# Patient Record
Sex: Male | Born: 1960 | Race: White | Hispanic: No | Marital: Married
Health system: Midwestern US, Community
[De-identification: ages and names within clinical notes are randomized; demographics above are authoritative.]

## PROBLEM LIST (undated history)

## (undated) DIAGNOSIS — E162 Hypoglycemia, unspecified: Secondary | ICD-10-CM

## (undated) DIAGNOSIS — Z87898 Personal history of other specified conditions: Secondary | ICD-10-CM

## (undated) DIAGNOSIS — I2510 Atherosclerotic heart disease of native coronary artery without angina pectoris: Secondary | ICD-10-CM

## (undated) DIAGNOSIS — M1990 Unspecified osteoarthritis, unspecified site: Secondary | ICD-10-CM

## (undated) DIAGNOSIS — K219 Gastro-esophageal reflux disease without esophagitis: Secondary | ICD-10-CM

## (undated) DIAGNOSIS — Z862 Personal history of diseases of the blood and blood-forming organs and certain disorders involving the immune mechanism: Secondary | ICD-10-CM

## (undated) DIAGNOSIS — F32A Depression, unspecified: Secondary | ICD-10-CM

## (undated) DIAGNOSIS — Z87442 Personal history of urinary calculi: Secondary | ICD-10-CM

## (undated) DIAGNOSIS — N200 Calculus of kidney: Secondary | ICD-10-CM

## (undated) DIAGNOSIS — I480 Paroxysmal atrial fibrillation: Secondary | ICD-10-CM

## (undated) DIAGNOSIS — G4730 Sleep apnea, unspecified: Secondary | ICD-10-CM

## (undated) DIAGNOSIS — F419 Anxiety disorder, unspecified: Secondary | ICD-10-CM

## (undated) DIAGNOSIS — R82992 Hyperoxaluria: Secondary | ICD-10-CM

## (undated) DIAGNOSIS — R319 Hematuria, unspecified: Secondary | ICD-10-CM

## (undated) DIAGNOSIS — I861 Scrotal varices: Secondary | ICD-10-CM

## (undated) HISTORY — DX: Personal history of other specified conditions: Z87.898

## (undated) HISTORY — DX: Anxiety disorder, unspecified: F41.9

## (undated) HISTORY — PX: NECK SURGERY: SHX720

## (undated) HISTORY — PX: INCISIONAL HERNIA REPAIR: SHX193

## (undated) HISTORY — DX: Sleep apnea, unspecified: G47.30

## (undated) HISTORY — DX: Personal history of diseases of the blood and blood-forming organs and certain disorders involving the immune mechanism: Z86.2

## (undated) HISTORY — PX: KNEE ARTHROSCOPY: SUR90

## (undated) HISTORY — DX: Atherosclerotic heart disease of native coronary artery without angina pectoris: I25.10

## (undated) HISTORY — PX: CYSTOSCOPY: SUR368

## (undated) HISTORY — PX: OTHER SURGICAL HISTORY: SHX169

## (undated) HISTORY — PX: CHOLECYSTECTOMY: SHX55

## (undated) HISTORY — DX: Gastro-esophageal reflux disease without esophagitis: K21.9

## (undated) HISTORY — DX: Hypoglycemia, unspecified: E16.2

## (undated) HISTORY — PX: REPLACEMENT TOTAL KNEE: SUR1224

## (undated) HISTORY — DX: Depression, unspecified: F32.A

## (undated) HISTORY — PX: APPENDECTOMY: SHX54

## (undated) HISTORY — PX: CYST EXCISION: SHX5701

## (undated) HISTORY — PX: RHINOPLASTY: SUR1284

## (undated) HISTORY — DX: Calculus of kidney: N20.0

## (undated) HISTORY — DX: Paroxysmal atrial fibrillation: I48.0

---

## 2001-03-23 HISTORY — PX: GASTRIC BYPASS OPEN: SUR638

## 2008-06-28 LAB — LIPID PANEL
CHOL/HDL Ratio: 2.3 (ref 0–5.0)
Cholesterol, total: 130 MG/DL (ref 0–200)
HDL Cholesterol: 57 MG/DL (ref 40–60)
LDL, calculated: 56.2 MG/DL (ref 0–100)
LDL/HDL Ratio: 1
Triglyceride: 84 MG/DL (ref 0–150)
VLDL, calculated: 16.8 MG/DL

## 2008-06-28 LAB — METABOLIC PANEL, COMPREHENSIVE
A-G Ratio: 1.5 (ref 0.8–1.7)
ALT (SGPT): 39 U/L (ref 30–65)
AST (SGOT): 27 U/L (ref 15–37)
Albumin: 4.3 g/dL (ref 3.4–5.0)
Alk. phosphatase: 80 U/L (ref 50–136)
Anion gap: 15 mmol/L (ref 5–15)
BUN/Creatinine ratio: 26 — ABNORMAL HIGH (ref 12–20)
BUN: 18 MG/DL (ref 7–18)
Bilirubin, total: 0.5 MG/DL (ref 0.1–0.9)
CO2: 23 MMOL/L (ref 21–32)
Calcium: 8.5 MG/DL (ref 8.4–10.4)
Chloride: 104 MMOL/L (ref 100–108)
Creatinine: 0.7 MG/DL (ref 0.6–1.3)
GFR est AA: >60 mL/min/{1.73_m2} (ref >60)
GFR est non-AA: >60 mL/min/{1.73_m2} (ref >60)
Globulin: 2.9 g/dL (ref 2.0–4.0)
Glucose: 69 MG/DL — ABNORMAL LOW (ref 74–99)
Potassium: 4.5 MMOL/L (ref 3.5–5.5)
Protein, total: 7.2 g/dL (ref 6.4–8.2)
Sodium: 142 MMOL/L (ref 136–145)

## 2008-06-28 LAB — BILIRUBIN, DIRECT: Bilirubin, direct: 0.1 MG/DL (ref 0.0–0.3)

## 2008-06-28 LAB — PSA SCREENING (SCREENING): Prostate Specific Ag: 0.6 ng/mL (ref 0.0–4.0)

## 2008-06-28 LAB — CK: CK: 67 U/L (ref 35–232)

## 2012-03-11 DIAGNOSIS — R7989 Other specified abnormal findings of blood chemistry: Secondary | ICD-10-CM | POA: Insufficient documentation

## 2012-03-11 LAB — AMB POC URINALYSIS DIP STICK AUTO W/ MICRO (MICRO RESULTS)
Bacteria (UA POC): NEGATIVE
Bilirubin (UA POC): NEGATIVE
Blood (UA POC): NEGATIVE
Glucose (UA POC): NEGATIVE
Ketones (UA POC): NEGATIVE
Nitrites (UA POC): NEGATIVE
Protein (UA POC): NEGATIVE mg/dL
RBCs (UA POC): NEGATIVE
Specific gravity (UA POC): 1.035 (ref 1.001–1.035)
Urobilinogen (UA POC): 0.2 (ref 0.2–1)
pH (UA POC): 5.5 (ref 4.6–8.0)

## 2012-03-11 LAB — AMB POC PVR, MEAS,POST-VOID RES,US,NON-IMAGING: PVR: 22 cc

## 2012-03-11 NOTE — Progress Notes (Signed)
ASSESSMENT and PLAN  1. Low serum testosterone level      Testium samples an Rx  2 month f/u with Dr Allyne Gee, (free and total testosterone 1 week before appointment with Dr Allyne Gee)    Discussion: increase water intake\\                       Testosterone application discussed with patient      HISTORY OF PRESENT ILLNESS  Travis Chung is a 51 y.o. male.  HPI  [name] was referred to [facility] by Verline Lema NP for low testosterone.  01/29/12 Free testosterone: 5.8                 Testosterone: 400                  PSA: 0.47    Pt states that he has put on 40 pounds, after losing 60 pounds post gastric bypass.  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     [name] states that his libido has been decreased for the past 2 years. He states he can get an erection and maintain it for sex, but he just does not have the desire.     LUTS: weak/slow flow, incomplete bladder emptying, frequency   Pt denies:nocturia,  urgency, dribbling, incontinence, hesitancy, hematuria, incontinence, fever, nausea or vomiting.     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  FM Hx: no known   Social Hx: quit smoking in 05/07/2011, had smoked since he as a teenage.    Review of Systems   Constitutional: Negative.         Weight gain   HENT: Negative.    Eyes: Negative.    Respiratory: Negative.    Gastrointestinal: Positive for diarrhea.   Genitourinary: Positive for frequency. Negative for dysuria, urgency, hematuria and flank pain.   Musculoskeletal:        Knee pain    Skin: Negative.    Neurological: Negative.    Endo/Heme/Allergies: Negative.    Psychiatric/Behavioral: The patient has insomnia.        Physical Exam   Constitutional: He is oriented to person, place, and time. He appears well-developed and well-nourished.   HENT:   Head: Normocephalic.   Pulmonary/Chest: Effort normal.   Abdominal: Soft.    Genitourinary:   Pt declined exam until next visit with Dr Allyne Gee    Neurological: He is alert and oriented to person, place, and time.   Skin: Skin is warm and dry.   Psychiatric:   Irritable and "flies of the handle" easiliy       Results for orders placed in visit on 03/11/12   AMB POC URINALYSIS DIP STICK AUTO W/ MICRO (MICRO RESULTS)       Result Value Range    Color (UA POC) Yellow      Clarity (UA POC) Clear      Glucose (UA POC) Negative  Negative    Bilirubin (UA POC) Negative  Negative    Ketones (UA POC) Negative  Negative    Specific gravity (UA POC) 1.035  1.001 - 1.035    Blood (UA POC) Negative  Negative    pH (UA POC) 5.5  4.6 - 8.0    Protein (UA POC) Negative  Negative mg/dL    Urobilinogen (UA POC) 0.2 mg/dL  0.2 -  1    Nitrites (UA POC) Negative  Negative    Leukocyte esterase (UA POC) Trace  Negative    Epithelial cells (UA POC)        WBCs (UA POC) rare      RBCs (UA POC) n      Bacteria (UA POC) n      Crystals (UA POC)        Other (UA POC)       AMB POC PVR, MEAS,POST-VOID RES,US,NON-IMAGING       Result Value Range    PVR 22

## 2012-03-11 NOTE — Patient Instructions (Signed)
MyChart Activation    Thank you for requesting access to MyChart. Please follow the instructions below to securely access and download your online medical record. MyChart allows you to send messages to your doctor, view your test results, renew your prescriptions, schedule appointments, and more.    How Do I Sign Up?    1. In your internet browser, go to [email or web address].  2. Click on the First Time User? Click Here link in the Sign In box. You will see the New Member Sign Up page.  3. Enter your MyChart Access Code exactly as it appears below. You will not need to use this code after you???ve completed the sign-up process. If you do not sign up before the expiration date, you must request a new code.    MyChart Access Code: [email or web address]  Expires: 06/09/2012  8:42 AM (This is the date your MyChart access code will expire)    4. Enter the last four digits of your Social Security Number (xxxx) and Date of Birth (mm/dd/yyyy) as indicated and click Submit. You will be taken to the next sign-up page.  5. Create a MyChart ID. This will be your MyChart login ID and cannot be changed, so think of one that is secure and easy to remember.  6. Create a MyChart password. You can change your password at any time.  7. Enter your Password Reset Question and Answer. This can be used at a later time if you forget your password.   8. Enter your e-mail address. You will receive e-mail notification when new information is available in MyChart.  9. Click Sign Up. You can now view and download portions of your medical record.  10. Click the Download Summary menu link to download a portable copy of your medical information.    Additional Information    If you have questions, please visit the Frequently Asked Questions section of the MyChart website at [email or web address] Remember, MyChart is NOT to be used for urgent needs. For medical emergencies, dial 911.

## 2012-03-11 NOTE — Telephone Encounter (Signed)
Pt called to let us know that he was given (1) 30 day sample of Testim and (1) 30 day rx for Testim but his follow up appt with Dr. Allyne Gee is another 2 weeks past the time that he will run out of medication.  Per Dewayne Hatch, the patient is ok'd to pick up another Testim sample from the office when he runs out to hold him over until follow up appt/[id number]

## 2012-04-29 NOTE — Telephone Encounter (Signed)
Pt called to let us know that he was given (1) 30 day sample of Testim and (1) 30 day rx for Testim but his follow up appt with Dr. Allyne Gee is another 2 weeks past the time that he will run out of medication. Per Dewayne Hatch, the patient is ok'd to pick up another Testim sample from the office when he runs out to hold him over until follow up appt/[id number]          Pt showed up today to pu samples.  Only had one box to give.  Pt has enough meds to cover until the 21st.  No provider in office.  Please prepared a new rx to pu and call when ready.

## 2012-05-20 NOTE — Progress Notes (Signed)
After verifying pt's name and date of birth   F/T testosterone was drawn today  and sent to [facility] w/o incident   pt tolerated procedure well.     Pt was also given a rx for testim for 30 days with one refill.

## 2012-05-25 LAB — TESTOSTERONE, FREE & TOTAL
Free testosterone (Direct): 9.1 pg/mL (ref 6.8–21.5)
Testosterone: 620 ng/dL (ref 348–1197)

## 2012-05-27 LAB — AMB POC URINALYSIS DIP STICK AUTO W/ MICRO (MICRO RESULTS)
Bacteria (UA POC): NEGATIVE
Bilirubin (UA POC): NEGATIVE
Blood (UA POC): NEGATIVE
Glucose (UA POC): NEGATIVE
Ketones (UA POC): NEGATIVE
Nitrites (UA POC): NEGATIVE
Protein (UA POC): NEGATIVE mg/dL
RBCs (UA POC): NEGATIVE
Specific gravity (UA POC): 1.02 (ref 1.001–1.035)
Urobilinogen (UA POC): 0.2 (ref 0.2–1)
WBCs (UA POC): NEGATIVE
pH (UA POC): 6.5 (ref 4.6–8.0)

## 2012-05-27 NOTE — Progress Notes (Signed)
Pt scheduled for biopsy and given date, time, rxs and instructions

## 2012-05-27 NOTE — Progress Notes (Signed)
BP 110/68   Ht 5\' 11"  (1.803 m)   Wt 222 lb (100.699 kg)   BMI 30.98 kg/m2    AUA Symptom Score 05/27/2012   Over the past month how often have you had the sensation that your baldder was not completely empty after you finished urinating? 2   Over the past month, how often have had to urinate again less than 2 hours after you last finished urinating? 2   Over the past month, how often have you found you stopped and started again several times when you urinated? 0   Over the past month, how often have you found it dificult to postpone urination? 0   Over the past month, how often have you had a weak urinary stream? 2   Over the past month, how often have you had to push or strain to begin urinating? 0   Over the past month, how many times did you most tipically get up to urinate from the time you went to bed at night until the time you got up in the moring? 1   AUA Score 7   If you were to spend the rest of your life with your urinary condition the way it is now, how would you feel about that? Pleased

## 2012-05-27 NOTE — Progress Notes (Signed)
[facility]  Dr. Alesia Banda   [address]  [place], [place] [address]    Chief Complaint   Patient presents with   ??? Results     testosterone     November 03, 1960  Travis Chung    History of Present Illness:        Travis Chung is a 52 y.o. male      ASSESSMENT & PLAN----->06/08/12...  H/o renal stones.    On testim  Sex drive   But energy improved , sleeps better .  Last PSA 0.47      Does have right prostate nodule   rec TRUs / Bx before continuing .  Denies decreased FOS,  frequency,  urgency,  gross hematuria,  dysuria, and has nocturia Xonce    We discussed indication and options for further evaluation of right induration .  I recommend a prostate biopsy. Potential risks from this procedure are to include but not limited to infection, bleeding, dysuria and urinary retention.  Understanding his options and indications for the procedure, he agrees to planned procedure.     Problem List Date Reviewed: 06-08-12        Codes Class Noted    Low serum testosterone level 257.2  03/11/2012    Overview    Addendum Jun 08, 2012  9:38 AM by Alesia Banda, MD      Testosterone     620 / 9.1Free   2/14   Testosterone 400/ 5.8 Free 11/13  Before testim   PSA 0.47   Had blood work  Ok at [facility] office                       AUA Assessment Score:  AUA Score: 7;    AUA Bother Rating: Pleased        PHYSICAL EXAMINATION:    BP 110/68   Ht 5\' 11"  (1.803 m)   Wt 222 lb (100.699 kg)   BMI 30.98 kg/m2, Body mass index is 30.98 kg/(m^2).      General appearance: alert, cooperative, no distress, appears stated age   Head: Normocephalic, without obvious abnormality, atraumatic   Neck: supple, symmetrical, trachea midline and no adenopathy   Back: symmetric,  No CVA tenderness.   Heart: regular rate and rhythm,  Abdomen: soft, non-tender. No masses, no organomegaly   Male genitalia: penis: no lesions or discharge. testes: no masses or tenderness. no hernias   Prostate: induration right prostate lobe    Rectal: Normal tone, no  masses or tenderness   Extremities: extremities  normal, atraumatic, no cyanosis or edema   Neurologic: Grossly normal     PSA /TESTOSTERONE - BSHSI [85]    No results found for this basenameMarletta Chung,  [id number],  [id number],  [id number],  [id number]         Review of Systems:  Pertinent items are noted in HPI.    Past Surgical History   Procedure Laterality Date   ??? Hx orthopaedic     ??? Pr abdomen surgery proc unlisted     ??? Pr breast surgery procedure unlisted     ??? Hx heent       Past Medical History   Diagnosis Date   ??? Kidney stone          Social History:    History   Smoking status   ??? Former Smoker   ??? Quit date:  05/06/2010   Smokeless tobacco   ??? Never Used           CURRENT MEDS & ALLERGIES:  Current Outpatient Prescriptions   Medication Sig Dispense Refill   ??? testosterone (TESTIM) 50 mg/5 gram (1 %) gel 5 g by TransDERmal route daily for 30 days.  1 Tube  1   ??? multivitamin (ONE A DAY) tablet Take 1 Tab by mouth daily.         Allergies   Allergen Reactions   ??? Codeine Unknown (comments)                LABS & RESULTS:    Results for orders placed in visit on 05/27/12   AMB POC URINALYSIS DIP STICK AUTO W/ MICRO (MICRO RESULTS)       Result Value Range    Color (UA POC) Yellow      Clarity (UA POC) Clear      Glucose (UA POC) Negative  Negative    Bilirubin (UA POC) Negative  Negative    Ketones (UA POC) Negative  Negative    Specific gravity (UA POC) 1.020  1.001 - 1.035    Blood (UA POC) Negative  Negative    pH (UA POC) 6.5  4.6 - 8.0    Protein (UA POC) Negative  Negative mg/dL    Urobilinogen (UA POC) 0.2 mg/dL  0.2 - 1    Nitrites (UA POC) Negative  Negative    Leukocyte esterase (UA POC) Trace  Negative    Epithelial cells (UA POC)        WBCs (UA POC)        RBCs (UA POC)        Bacteria (UA POC)        Crystals (UA POC)    Negative    Other (UA POC)           This note has been sent to Almira Bar, MD, the referring physican, with findings and  recommendations

## 2012-06-06 LAB — AMB POC URINALYSIS DIP STICK AUTO W/ MICRO (MICRO RESULTS)
Bacteria (UA POC): NEGATIVE
Bilirubin (UA POC): NEGATIVE
Glucose (UA POC): NEGATIVE
Ketones (UA POC): NEGATIVE
Nitrites (UA POC): NEGATIVE
Protein (UA POC): NEGATIVE mg/dL
RBCs (UA POC): NEGATIVE
Specific gravity (UA POC): 1.025 (ref 1.001–1.035)
Urobilinogen (UA POC): 0.2 (ref 0.2–1)
pH (UA POC): 5.5 (ref 4.6–8.0)

## 2012-06-06 NOTE — Progress Notes (Signed)
Patient states after he got home from biopsy he had 5 BM's and they were very bloody.  I advised patient to stay off his feet, no heavy lifting, pulling or straining.  I told him I would advise Dr. Allyne Gee and if there was a cause for concern he would call him.

## 2012-06-06 NOTE — Progress Notes (Signed)
Problem List Date Reviewed: 05/27/2012        ICD-9-CM Class Noted    Low serum testosterone level 257.2  03/11/2012    Overview    Addendum 05/27/2012  9:38 AM by Alesia Banda, MD      Testosterone     620 / 9.1Free   2/14   Testosterone 400/ 5.8 Free 11/13  Before testim   PSA 0.47   Had blood work  Ok at [facility] office                 Transrectal Ultrasound of Prostate with Biospy of Prostate and/or Seminal Vesicles    Patient: Travis Chung       1960/11/27  [name]                                                      Date: 06/06/2012      Indications:  The patient was given an antibiotic and cleaning enema/suppository prior to the procedure described below.  He was placed in the left lateral decubitus position with knees drawn up.    Anesthesia: Prostate Block - 2% Xylocaine 10cc was injected bilaterally and 2% Xylocaine jelly    An endorectal probe was placed in the rectum.  Transverse and sagittal images of the prostate and seminal vesicles were obtained.  Using the needle guide on the probe, a biopsy needle was used to obtain biopsies of the prostate.  If seen, abnormally appearing areas were targeted along with systematic biopsies from each side.  Tissue cores were placed in formalin jars and sent for pathologic review.    The probe was removed and the patient was observed.  He was advised to finish his prescribed antibiotics and avoid strenuous physical and sexual activity for several days.  He was told to call our office if he developed fever, excessive or persistent blood in the urine/stool, or difficulty voiding.  We arranged follow-up so that we could review the results of this biopsy.    Biopsy-Number of cores:      #12        Base/Lateral Mid/Lateral Apex/Lateral      Right Side  1/1 1/1 1/1      Left Side 1/1 1/1 1/1                           Interpretation:  PSA: 0.47    Prostate Exam: induration right lobe   Prostatic Volume: 26 grams  PSA Density: 0.017  Prostatic capsule: Normal  Transition  Zone:calculi Right TZ   Lesions: None   Seminal Vesicles: Normal    Alesia Banda, MD

## 2012-06-06 NOTE — Progress Notes (Signed)
BP 110/72   Ht 6' (1.829 m)   Wt 214 lb (97.07 kg)   BMI 29.02 kg/m2    Patient here for Rocephin injection(1GM)per Dr. Allyne Gee order. }injection of Rocephin1GM administered IM to patient's gluteus ( right) without any difficulty(after spraying site with Ethyl Chloride first).   Orders Placed This Encounter   ??? AMB POC URINALYSIS DIP STICK AUTO W/ MICRO (MICRO RESULTS)   ??? CEFTRIAXONE SODIUM INJECTION PER 250 MG     Order Specific Question:  Dose     Answer:  1 gr     Order Specific Question:  Site     Answer:  RIGHT GLUTEUS     Order Specific Question:  Expiration Date     Answer:  10/22/2014     Order Specific Question:  Lot#     Answer:  [id number]     Order Specific Question:  Manufacturer     Answer:  [employer]     Order Specific Question:  Charge Quantity?     Answer:  4     Order Specific Question:  Perfomed by/Witnessed by:     Answer:  amr     Order Specific Question:  NDC#     Answer:  0454-0981-19   ??? PR THER/PROPH/DIAG INJECTION, SUBCUT/IM   ??? cefTRIAXone (ROCEPHIN) 1 gram injection     Sig: 1 g by IntraMUSCular route once for 1 dose.     Dispense:  1 Vial     Refill:  0     .

## 2012-06-08 NOTE — Addendum Note (Signed)
Addended by: Drexel Iha A on: 06/08/2012 10:36 AM     Modules accepted: Orders

## 2013-09-29 DIAGNOSIS — Z96659 Presence of unspecified artificial knee joint: Secondary | ICD-10-CM | POA: Insufficient documentation

## 2014-01-01 ENCOUNTER — Encounter: Attending: Urology | Primary: Nurse Practitioner

## 2014-01-01 ENCOUNTER — Ambulatory Visit
Admit: 2014-01-01 | Discharge: 2014-01-01 | Payer: PRIVATE HEALTH INSURANCE | Attending: Urology | Primary: Nurse Practitioner

## 2014-01-01 DIAGNOSIS — R319 Hematuria, unspecified: Secondary | ICD-10-CM

## 2014-01-01 LAB — AMB POC URINALYSIS DIP STICK AUTO W/ MICRO (MICRO RESULTS)
Bacteria (UA POC): NEGATIVE
Nitrites (UA POC): NEGATIVE
Specific gravity (UA POC): 1.025 (ref 1.001–1.035)
Urobilinogen (UA POC): 0.2 (ref 0.2–1)
WBCs (UA POC): NEGATIVE
pH (UA POC): 5 (ref 4.6–8.0)

## 2014-01-01 NOTE — Progress Notes (Signed)
Travis ConchRobert Ackman  July 05, 1960    Assessment:    ICD-10-CM ICD-9-CM    1. Hematuria R31.9 599.70 AMB POC URINALYSIS DIP STICK AUTO W/ MICRO (MICRO RESULTS)       Plan:  1. PSA  KUB   Cytology     Chief Complaint   Patient presents with   ??? Hematuria   ??? Kidney Stone     History of Present Illness: Travis ConchRobert Chung is a 53 y.o. male who presents today for long h/o stones  About 3 / year   H/o litho , ureteroscopy  And microscopic hematuria   No gross hematuria   .  Discussed hematuria w/u if gross hematuria while not passing stone.      No longer on testim   PSA  Was 0.47     FOS good  , Nocturia n , dysuria n , frequency n , urgency n, hematuria no gross      Problem List  Date Reviewed: 01/01/2014        Codes Class Noted    Low serum testosterone level ICD-10-CM:  E29.1  ICD-9-CM:  257.2  03/11/2012    Overview    Addendum 06/09/2012  7:58 AM by Alesia BandaKevin W Cynia Abruzzo, MD     Testosterone     620 / 9.1Free   2/14   Testosterone 400/ 5.8 Free 11/13  Before testim   PSA 0.47   Had blood work  Ok at [facility] office   TRUS / Bx   Neg 3/14                   We are asked to see for evaluation and management by Dr. Mayford Knifeurner   .       AUA Assessment Score:  AUA Score: 4;    AUA Bother Rating: Pleased               Past Medical History   Diagnosis Date   ??? Kidney stone        Past Surgical History   Procedure Laterality Date   ??? Hx orthopaedic     ??? Pr abdomen surgery proc unlisted     ??? Pr breast surgery procedure unlisted     ??? Hx heent         History     Social History   ??? Marital Status: MARRIED     Spouse Name: N/A     Number of Children: N/A   ??? Years of Education: N/A     Social History Main Topics   ??? Smoking status: Former Smoker     Quit date: 05/06/2010   ??? Smokeless tobacco: Never Used   ??? Alcohol Use: Yes   ??? Drug Use: No   ??? Sexual Activity:     Partners: Female     Other Topics Concern   ??? Not on file     Social History Narrative       No family history on file.    Outpatient Encounter Prescriptions as of 01/01/2014    Medication Sig Dispense Refill   ??? cyanocobalamin (VITAMIN B12) 1,000 mcg/mL injection 1,000 mcg by IntraMUSCular route once.     ??? venlafaxine-SR (EFFEXOR XR) 75 mg capsule Take  by mouth daily.     ??? cholecalciferol, VITAMIN D3, (VITAMIN D3) 5,000 unit tab tablet Take  by mouth daily.     ??? [DISCONTINUED] diazepam (VALIUM) 5 mg tablet 1-2  Tabs 1 hour before biopsy 2 Tab 0   ???  multivitamin (ONE A DAY) tablet Take 1 Tab by mouth daily.       No facility-administered encounter medications on file as of 01/01/2014.       Allergies   Allergen Reactions   ??? Codeine Unknown (comments)       Review of Systems  Constitutional: Fever:   Skin: Rash: No;  HEENT: Hearing difficulty: No;  Eyes: Blurred vision: No;  Cardiovascular: Chest pain: No;  Respiratory: Shortness of breath: No;  Gastrointestinal: Nausea/vomiting: No;  Musculoskeletal: Back pain: No;  Neurological: Weakness: No;  Psychological: Memory loss: No;  Comments/additional findings:   All other systems reviewed and are negative        Physical Exam:  BP 138/84 mmHg   Ht 5\' 11"  (1.803 m)   Wt 211 lb (95.709 kg)   BMI 29.44 kg/m2  Constitutional: WDWN, Pleasant and appropriate affect, No acute distress.    CV:  No peripheral swelling noted.  Respiratory: No respiratory distress or difficulties.  Abdomen:  No abdominal masses or tenderness. No CVA tenderness. No inguinal hernia noted.   GU Male:    GNF:[id number]:Perineum normal to visual inspection, no erythema or irritation, Sphincter with good tone, Rectum with no hemorrhoids, fissures or masses, Prostate  Slight right nodule  SCROTUM:  No scrotal rash or lesions noticed.  Normal bilateral testes and epididymis.   PENIS: Urethral meatus normal in location and size. No urethral discharge.  Skin: Normal color. No jaundice.    Neuro/Psych:  Alert and oriented x3, affect appropriate.   Lymphatic:   No enlarged inguinal lymph nodes.      UA:   Results for orders placed or performed in visit on 01/01/14    AMB POC URINALYSIS DIP STICK AUTO W/ MICRO (MICRO RESULTS)   Result Value Ref Range    Color (UA POC) Yellow     Clarity (UA POC) Clear     Glucose (UA POC) 1+ Negative    Bilirubin (UA POC) 1+ Negative    Ketones (UA POC) 1+ Negative    Specific gravity (UA POC) 1.025 1.001 - 1.035    Blood (UA POC) 2+ Negative    pH (UA POC) 5.0 4.6 - 8.0    Protein (UA POC) Trace Negative mg/dL    Urobilinogen (UA POC) 0.2 mg/dL 0.2 - 1    Nitrites (UA POC) Negative Negative    Leukocyte esterase (UA POC) Trace Negative    Epithelial cells (UA POC)      WBCs (UA POC) n     RBCs (UA POC) 4-5     Bacteria (UA POC) n Negative    Crystals (UA POC)  Negative    Other (UA POC)         Alesia BandaKEVIN W Laruth Hanger, MD on 01/01/2014

## 2014-01-01 NOTE — Progress Notes (Signed)
AUA Assessment Score:  AUA Score: 4;    AUA Bother Rating: Pleased  BP 138/84 mmHg   Ht 5\' 11"  (1.803 m)   Wt 211 lb (95.709 kg)   BMI 29.44 kg/m2

## 2014-01-03 LAB — CYTOLOGY NON GYN, UROLOGY OF ~~LOC~~ LAB

## 2014-01-03 LAB — URINE C&S

## 2014-01-04 NOTE — Progress Notes (Signed)
Quick Note:        Call Pt cytology was abnormal rec come in to discuss    ______

## 2014-01-08 NOTE — Progress Notes (Signed)
Quick Note:        Spoke with patient and made follow up appointment for 01/11/14    ______

## 2014-01-08 NOTE — Progress Notes (Signed)
Quick Note:        Left message for patient to call back for test results    ______

## 2014-01-11 ENCOUNTER — Ambulatory Visit
Admit: 2014-01-11 | Discharge: 2014-01-11 | Payer: PRIVATE HEALTH INSURANCE | Attending: Urology | Primary: Nurse Practitioner

## 2014-01-11 DIAGNOSIS — R3129 Other microscopic hematuria: Secondary | ICD-10-CM

## 2014-01-11 LAB — AMB POC URINALYSIS DIP STICK AUTO W/ MICRO (MICRO RESULTS)
Bacteria (UA POC): NEGATIVE
Bilirubin (UA POC): NEGATIVE
Glucose (UA POC): NEGATIVE
Ketones (UA POC): NEGATIVE
Nitrites (UA POC): NEGATIVE
Specific gravity (UA POC): 1.025 (ref 1.001–1.035)
Urobilinogen (UA POC): 0.2 (ref 0.2–1)
WBCs (UA POC): NEGATIVE
pH (UA POC): 5.5 (ref 4.6–8.0)

## 2014-01-11 LAB — CREATININE
Creatinine: 1 mg/dL (ref 0.5–1.2)
GFRAA: >60 (ref >60.0)
GFRNA: >60 (ref >60.0)

## 2014-01-11 NOTE — Progress Notes (Signed)
BP 118/78 mmHg   Ht 5\' 11"  (1.803 m)   Wt 214 lb (97.07 kg)   BMI 29.86 kg/m2  AUA Assessment Score:  AUA Score: 4;    AUA Bother Rating: Pleased

## 2014-01-11 NOTE — Progress Notes (Signed)
Travis ConchRobert Mondesir  1960-04-26    Assessment:    ICD-10-CM ICD-9-CM    1. Microscopic hematuria R31.2 599.72 CT UROGRAM W WO CONT      CREATININE      AMB POC URINALYSIS DIP STICK AUTO W/ MICRO (MICRO RESULTS)      CYTOLOGY NON GYN, [facility]   2. Hematuria, microscopic R31.2 599.72        Plan:  1. Ct urogram    Creatinine   Cysto      Chief Complaint   Patient presents with   ??? Hematuria     History of Present Illness: Travis Chung is a 53 y.o. male who presents today for atypical cytology consider  Neoplastic  C&S  Was NG       FOS good  , Nocturia n , dysuria n , frequency n , urgency n, hematuria no gross       Problem List  Date Reviewed: 01/11/2014        Codes Class Noted    Hematuria, microscopic ICD-10-CM:  R31.2  ICD-9-CM:  599.72  01/04/2014    Overview    Signed 01/04/2014  9:27 AM by Alesia BandaKevin W Jahad Old, MD     Cytology  10/15   atypical  May be reactive consider neoplastic           Renal calculi ICD-10-CM:  N20.0  ICD-9-CM:  592.0  01/04/2014        Low serum testosterone level ICD-10-CM:  E29.1  ICD-9-CM:  257.2  03/11/2012    Overview    Addendum 06/09/2012  7:58 AM by Alesia BandaKevin W Abbagale Goguen, MD     Testosterone     620 / 9.1Free   2/14   Testosterone 400/ 5.8 Free 11/13  Before testim   PSA 0.47   Had blood work  Ok at [facility]   TRUS / Bx   Neg 3/14                   We are asked to see for evaluation and management by Dr. Mayford Knifeurner .       AUA Assessment Score:  AUA Score: 4;    AUA Bother Rating: Pleased               Past Medical History   Diagnosis Date   ??? Kidney stone    ??? Hematuria, microscopic 01/04/2014     Cytology  10/15   atypical  May be reactive consider neoplastic        Past Surgical History   Procedure Laterality Date   ??? Hx orthopaedic     ??? Pr abdomen surgery proc unlisted     ??? Pr breast surgery procedure unlisted     ??? Hx heent         History     Social History   ??? Marital Status: MARRIED     Spouse Name: N/A     Number of Children: N/A   ??? Years of Education: N/A      Social History Main Topics   ??? Smoking status: Former Smoker     Quit date: 05/06/2010   ??? Smokeless tobacco: Never Used   ??? Alcohol Use: Yes   ??? Drug Use: No   ??? Sexual Activity:     Partners: Female     Other Topics Concern   ??? Not on file     Social History Narrative       History reviewed.  No pertinent family history.    Outpatient Encounter Prescriptions as of 01/11/2014   Medication Sig Dispense Refill   ??? cyanocobalamin (VITAMIN B12) 1,000 mcg/mL injection 1,000 mcg by IntraMUSCular route once.     ??? venlafaxine-SR (EFFEXOR XR) 75 mg capsule Take  by mouth daily.     ??? cholecalciferol, VITAMIN D3, (VITAMIN D3) 5,000 unit tab tablet Take  by mouth daily.     ??? multivitamin (ONE A DAY) tablet Take 1 Tab by mouth daily.       No facility-administered encounter medications on file as of 01/11/2014.       Allergies   Allergen Reactions   ??? Codeine Unknown (comments)       Review of Systems  Constitutional: Fever: No;  Skin: Rash: No;  HEENT: Hearing difficulty: No;  Eyes: Blurred vision: No;  Cardiovascular: Chest pain: No;  Respiratory: Shortness of breath: No;  Gastrointestinal: Nausea/vomiting: No;  Musculoskeletal: Back pain: No;  Neurological: Weakness: No;  Psychological: Memory loss: No;  Comments/additional findings:   All other systems reviewed and are negative        Physical Exam:  BP 118/78 mmHg   Ht 5\' 11"  (1.803 m)   Wt 214 lb (97.07 kg)   BMI 29.86 kg/m2  Constitutional: WDWN, Pleasant and appropriate affect, No acute distress.    CV:  No peripheral swelling noted.  Respiratory: No respiratory distress or difficulties.  Abdomen:  No abdominal masses or tenderness. No CVA tenderness. No inguinal hernia noted.   GU Male:   Skin: Normal color. No jaundice.    Neuro/Psych:  Alert and oriented x3, affect appropriate.   Lymphatic:   No enlarged inguinal lymph nodes.      UA:   Results for orders placed or performed in visit on 01/11/14   AMB POC URINALYSIS DIP STICK AUTO W/ MICRO (MICRO RESULTS)    Result Value Ref Range    Color (UA POC) Yellow     Clarity (UA POC) Clear     Glucose (UA POC) Negative Negative    Bilirubin (UA POC) Negative Negative    Ketones (UA POC) Negative Negative    Specific gravity (UA POC) 1.025 1.001 - 1.035    Blood (UA POC) 2+ Negative    pH (UA POC) 5.5 4.6 - 8.0    Protein (UA POC) 1+ Negative mg/dL    Urobilinogen (UA POC) 0.2 mg/dL 0.2 - 1    Nitrites (UA POC) Negative Negative    Leukocyte esterase (UA POC) Trace Negative    Epithelial cells (UA POC)      WBCs (UA POC)      RBCs (UA POC)      Bacteria (UA POC)  Negative    Crystals (UA POC)  Negative    Other (UA POC)         Alesia BandaKEVIN W Yuliya Nova, MD on 01/11/2014

## 2014-01-11 NOTE — Addendum Note (Signed)
Addended by: Rudi HeapMANN, Tymon Nemetz A on: 01/11/2014 04:25 PM      Modules accepted: Orders

## 2014-01-15 LAB — URINE C&S

## 2014-01-16 LAB — CYTOLOGY NON GYN, UROLOGY OF ~~LOC~~ LAB

## 2014-01-18 ENCOUNTER — Encounter

## 2014-01-18 NOTE — Telephone Encounter (Signed)
Patient called inquiring about his CT scan results says he is very concerned because his grandson has cancer.

## 2014-01-19 NOTE — Telephone Encounter (Signed)
Surgery scheduled for 02-09-14.  [id number]

## 2014-01-24 ENCOUNTER — Encounter: Attending: Urology | Primary: Nurse Practitioner

## 2014-01-24 LAB — UROVYSION FISH

## 2014-02-14 NOTE — Telephone Encounter (Signed)
Called and left message with patient on his home phone in regards to setting up an appointment. I called his cell phone and was able to reach him and we scheduled him for 03/10/14 at 8:00am with Dr. Winnifred Friarobey. Patient confirmed that it was to discuss surgery with Dr. Winnifred Friarobey.

## 2014-03-10 ENCOUNTER — Ambulatory Visit
Admit: 2014-03-10 | Discharge: 2014-03-10 | Payer: PRIVATE HEALTH INSURANCE | Attending: Urology | Primary: Nurse Practitioner

## 2014-03-10 DIAGNOSIS — N200 Calculus of kidney: Secondary | ICD-10-CM

## 2014-03-10 LAB — AMB POC URINALYSIS DIP STICK AUTO W/O MICRO
Bilirubin (UA POC): NEGATIVE
Glucose (UA POC): NEGATIVE
Ketones (UA POC): NEGATIVE
Nitrites (UA POC): NEGATIVE
Specific gravity (UA POC): 1.025 (ref 1.001–1.035)
Urobilinogen (UA POC): 0.2 (ref 0.2–1)
pH (UA POC): 5 (ref 4.6–8.0)

## 2014-03-10 NOTE — Progress Notes (Signed)
Travis ConchRobert Gamero  1960/12/11    Assessment/Plan:  1. Right staghorn calculus            History of gastric bypass 2005 - will need metabolic workup post surgery            right percutaneous nephrostolithotomy preceeded by percutaneous nephrostomy in radiology.   Discussed indications, risks and complications. All questions answered.    2. No anticoagulants      Talma Aguillard Maryjane HurterL Evaline Waltman, MD      Chief Complaint   Patient presents with   ??? Kidney Stone       History of Present Illness: Travis Chung is a 53 y.o. male who presents today for above issues. We are asked to see for evaluation and management by Dr. Allyne Gee*Sanders    Location right kidney  Duration years   Associated signs/symptoms pain  Modifying factors na  Severity mod    Radiographs reveal:   CT reviewed     Presently the patient denies febrile.  Patient denies chills and has flank pain.            The patient denies any difficulty voiding   Urinary incontinence: no  Hematuria: yes  Abdominal Pain: no  Weight loss: no    Has history of renal stone disease: yes  Family history of renal stone disease: no  History of bowel disease (crohn's etc): yes  Has history of gout: no    Family history of prostate cancer: no    Past Medical History   Diagnosis Date   ??? Kidney stone    ??? Hematuria, microscopic 01/04/2014     Cytology  10/15   atypical  May be reactive consider neoplastic        Past Surgical History   Procedure Laterality Date   ??? Hx orthopaedic     ??? Pr abdomen surgery proc unlisted     ??? Pr breast surgery procedure unlisted     ??? Hx heent         History     Social History   ??? Marital Status: MARRIED     Spouse Name: N/A     Number of Children: N/A   ??? Years of Education: N/A     Social History Main Topics   ??? Smoking status: Former Smoker     Quit date: 05/06/2010   ??? Smokeless tobacco: Never Used   ??? Alcohol Use: Yes   ??? Drug Use: No   ??? Sexual Activity:     Partners: Female     Other Topics Concern   ??? Not on file     Social History Narrative        No family history on file.    Outpatient Encounter Prescriptions as of 03/10/2014   Medication Sig Dispense Refill   ??? cyanocobalamin (VITAMIN B12) 1,000 mcg/mL injection 1,000 mcg by IntraMUSCular route once.     ??? venlafaxine-SR (EFFEXOR XR) 75 mg capsule Take  by mouth daily.     ??? cholecalciferol, VITAMIN D3, (VITAMIN D3) 5,000 unit tab tablet Take  by mouth daily.     ??? multivitamin (ONE A DAY) tablet Take 1 Tab by mouth daily.       No facility-administered encounter medications on file as of 03/10/2014.       Allergies   Allergen Reactions   ??? Codeine Unknown (comments)       Review of Systems  Constitutional: Fever: No  Skin: Rash: No  HEENT: Hearing difficulty:  No  Eyes: Blurred vision: No  Cardiovascular: Chest pain: No  Respiratory: Shortness of breath: No  Gastrointestinal: Nausea/vomiting: No  Musculoskeletal: Back pain: No  Neurological: Weakness: No  Psychological: Memory loss: No  Comments/additional findings:       Physical Exam:  BP 110/80 mmHg   Ht 5\' 11"  (1.803 m)   Wt 205 lb (92.987 kg)   BMI 28.60 kg/m2  Alert oriented to person, place, and time  Well developed  HEENT - normocephalic, no masses  Neck - full ROM, symmetric, no thyromegaly  CV - RRR, normal pulses in wrist and groin  Chest - unlabored resp, no wheezes  Abdomen - no masses, no tenderness, no hepatosplenomegaly, no distention, no hernias  Extremities - FROM, no edema of lower extremities    UA:   Results for orders placed or performed in visit on 03/10/14   AMB POC URINALYSIS DIP STICK AUTO W/O MICRO   Result Value Ref Range    Color (UA POC) Yellow     Clarity (UA POC) Clear     Glucose (UA POC) Negative Negative    Bilirubin (UA POC) Negative Negative    Ketones (UA POC) Negative Negative    Specific gravity (UA POC) 1.025 1.001 - 1.035    Blood (UA POC) 4+ Negative    pH (UA POC) 5.0 4.6 - 8.0    Protein (UA POC) 1+ Negative mg/dL    Urobilinogen (UA POC) 0.2 mg/dL 0.2 - 1    Nitrites (UA POC) Negative Negative     Leukocyte esterase (UA POC) Trace Negative         Review allergies, etc yes      ZO:[id number]:SUELLEN TURNER, MD      ICD-10-CM ICD-9-CM    1. Staghorn calculus N20.0 592.0 AMB POC URINALYSIS DIP STICK AUTO W/O MICRO   2. Urine leukocytes N39.0 791.7 AMB POC URINALYSIS DIP STICK AUTO W/O MICRO   3. Hematuria R31.9 599.70 AMB POC URINALYSIS DIP STICK AUTO W/O MICRO       I performed complete discussion and exam as noted above. All reviewed in detail.     Randa NgoEDWIN L Aranza Geddes, MD on 03/10/2014

## 2014-03-27 NOTE — Telephone Encounter (Signed)
Called and spoke with patient in regards to his surgery for Friday 03/30/14 [facility]. I asked if I could move his surgery time from 11:15am to 1:00pm due to his IR schedule being changed do to a scheduling accident. He was supposed to be scheduled at one, but the time was not confirmed so now his IR time is 11:00am. [facility]. Patient confirmed that would be fine. He would just be hungry a little while longer. I confirmed and apologized for the inconvenience. He said it was ok because he did not want to change days. I thanked him.

## 2014-03-30 NOTE — Telephone Encounter (Signed)
Called and spoke with patient's wife in regards to how her husband was doing after surgery and setting up his follow up appointments. She stated he did great during surgery, but he had not come back to his normal room yet so she was waiting. I said I was glad he did well during surgery. I told her that Dr. Dema Severin wanted him to come in on Monday 04/02/14 so we could get his tube removed, but I know they lived in [place] so I got it scheduled over at Dr. Baird Cancer office at 10:30am with the nurse. She confirmed the appointment. I said he was not due to come back for about 3 months with either Dr. Baird Cancer or Dr. Dema Severin and she said the patient would probably want Dr. Dema Severin. I told her that I would schedule the appointment and if for any reason he wanted to go back to Dr. Baird Cancer then I would just change the appointment. She confirmed. I explained the 24 hour urine test to her and said I would send off a script for a kit to be delivered to them. She confirmed and thanked me for my help.

## 2014-03-30 NOTE — Telephone Encounter (Signed)
pcn out Monday. 24 hour urine and follow up by phone. See me or Dr Allyne GeeSanders with kub in 3 months or so - Dr Allyne GeeSanders preference.    Caryn BeeKevin,    Lots of stone but looked good postop.     Thanks much, ed

## 2014-04-02 ENCOUNTER — Institutional Professional Consult (permissible substitution): Admit: 2014-04-02 | Discharge: 2014-04-02 | Payer: PRIVATE HEALTH INSURANCE | Primary: Nurse Practitioner

## 2014-04-02 ENCOUNTER — Encounter: Primary: Nurse Practitioner

## 2014-04-02 DIAGNOSIS — N200 Calculus of kidney: Secondary | ICD-10-CM

## 2014-04-02 MED ORDER — OXYCODONE-ACETAMINOPHEN 5 MG-325 MG TAB
5-325 mg | ORAL_TABLET | ORAL | Status: DC | PRN
Start: 2014-04-02 — End: 2014-06-28

## 2014-04-02 NOTE — Progress Notes (Signed)
Patient here for voiding trial per Dr. Winnifred Friarobey order.   Dr. Allyne GeeSanders is present in the clinic as incident to. 04/02/2014      I'm  Travis BandaKEVIN W SANDERS, Travis Chung and I approve of this assessment and plan             There were no vitals taken for this visit.    Patient is advised that there will be a slight discomfort during removal of the PCN tube. Privacy was provided.       Procedure:    Under clean technique, the balloon was emptied by inserting the barrel of the syringe and withdrawing the small amount of fluid used during inflation.  After the sutures were removed and the PCN tube was gently grasped and removed without difficulty. The area was examined, minimal skin breakdown from previous dressing was noted and a thick dressing applied.      Patient knows to report any symptoms of fever, chills, or severe pain to our office or to go to the ER if after hours.   Patient verbalized understanding of the above.  Questions were answered.      No orders of the defined types were placed in this encounter.       Travis Chung on 04/02/2014

## 2014-04-02 NOTE — Progress Notes (Signed)
Patient given script for Percocet per Dr. Allyne GeeSanders and Berkley Harveyharlie Branson.

## 2014-04-03 NOTE — Telephone Encounter (Signed)
Patient called in and stated that he had surgery with Dr. Winnifred Friarobey on 03/30/14 and has his PCN tube removed yesterday in the office with Ai. He asked when he could go back to work. I told him that with the tube removed he was welcome to go back to work today if he would like. I explained that with the procedure that he had, Dr. Winnifred Friarobey usually keeps his patients on light duty for the first 2 weeks and then after that they can go back to work full duty. The patient stated that with his job he does not really have a light duty, he does heavy lifting and climbing up ladders and other things of that nature. He asked if he could just be off for the 2 weeks that he is supposed to be on light duty and then just go back to work full duty. I told him that I would need to clear that with Dr. Winnifred Friarobey and I would call him back when I got an answer. He said that was fine. He faxed me the form that would need to be filled out saying that it was ok.

## 2014-04-23 NOTE — Telephone Encounter (Signed)
Patient aware that per Dr. Winnifred Friarobey retesting is needed for 24 hr urine panel.  [facility]

## 2014-05-18 NOTE — Progress Notes (Signed)
Quick Note:        repeat    ______

## 2014-05-28 NOTE — Progress Notes (Signed)
Quick Note:        urocit k 20 meq bid and repeat 24 hour urine in 3 months at follow up with kub    ______

## 2014-05-29 MED ORDER — POTASSIUM CITRATE SR 10 MEQ (1,080 MG) TAB
10 mEq (1,080 mg) | ORAL_TABLET | Freq: Two times a day (BID) | ORAL | Status: DC
Start: 2014-05-29 — End: 2014-06-28

## 2014-05-29 NOTE — Telephone Encounter (Addendum)
-----   Message from Randa NgoEdwin L Robey, MD sent at 05/28/2014  5:08 PM EST -----  urocit k 20 meq bid and repeat 24 hour urine in 3 months at follow up with kub    Patient aware of the Urocit K medication being sent to the preferred pharmacy. He will follow up with Dr. Winnifred Friarobey in April and we will repeat 24 hr urine in June. He is aware and will contact the office if he has any other questions.

## 2014-06-28 ENCOUNTER — Encounter: Primary: Nurse Practitioner

## 2014-06-28 ENCOUNTER — Ambulatory Visit
Admit: 2014-06-28 | Discharge: 2014-06-28 | Payer: PRIVATE HEALTH INSURANCE | Attending: Urology | Primary: Nurse Practitioner

## 2014-06-28 DIAGNOSIS — Z87442 Personal history of urinary calculi: Secondary | ICD-10-CM | POA: Insufficient documentation

## 2014-06-28 LAB — AMB POC URINALYSIS DIP STICK AUTO W/O MICRO
Bilirubin (UA POC): NEGATIVE
Glucose (UA POC): NEGATIVE
Ketones (UA POC): NEGATIVE
Leukocyte esterase (UA POC): NEGATIVE
Nitrites (UA POC): NEGATIVE
Protein (UA POC): NEGATIVE mg/dL
Specific gravity (UA POC): 1.03 (ref 1.001–1.035)
Urobilinogen (UA POC): 0.2 (ref 0.2–1)
pH (UA POC): 6.5 (ref 4.6–8.0)

## 2014-06-28 MED ORDER — POTASSIUM CITRATE SR 10 MEQ (1,080 MG) TAB
10 mEq (1,080 mg) | ORAL_TABLET | Freq: Two times a day (BID) | ORAL | Status: DC
Start: 2014-06-28 — End: 2016-09-07

## 2014-06-28 NOTE — Progress Notes (Signed)
KUB performed today per Dr. Robey.

## 2014-06-28 NOTE — Progress Notes (Signed)
Travis Chung  May 18, 1960    Assessment/Plan:  1. History of urolithiasis    No renal or ureteral calculi by KUB 06/28/14    S/p right PCNL 03/2014   Stone analysis: ca oxalate, ca phosphate    24-hour urine 04/2013: all indices abnormal    On urocit-k BID            Risk factors; h/o gastric bypass 2005    Continue urocit-k BID   Discussed dietary issues regarding stone disease.  F/u in 6 months with repeat 24-hour urine prior, same day KUB     3. History of microhematuria, 2/2 #2   Atypical urine cytology, negative FISH 12/2013   Normal bilateral retrogrades aside from stone burden on right 01/2014 by Dr. Jacklynn Lewis, MD      Chief Complaint   Patient presents with   ??? Kidney Stone     PT had SD KUB with no sx at this time     History of Present Illness: Travis Chung is a 54 y.o. male who presents today in follow up for his history of urolithiasis. The patient is s/p right PCNL 03/2014 for staghorn renal calculus. He has had two 24-hour urines which showed diffuse abnormal metabolic parameters specifically; hyperoxaluria, hypercalciuria, hypocitraturia, and low urine volume. Initiated on urocit-k BID and is tolerating well. F/u KUB today shows no renal or ureteral calculi. He is asymptomatic and denies flank pain, abdominal pain, gross hematuria, dysuria, n/v/f/c. He has increased his fluid intake as of recent.     Location right kidney  Duration years   Associated signs/symptoms none   Modifying factors n/a  Severity n/a    Radiographs reveal:   KUB today:  Impression:  Mild DJD of lumbar spine  Normal bowel gas pattern  Multiple surgical staples LUQ  No obvious renal or ureteral calculi   Several small pelvic vascular calcifications     Pre-op CTU 01/16/14 - report reviewed:  IMPRESSION:  1. Large obstructive staghorn calculus in the mid to lower pole  of the right kidney with resultant right upper pole  hydronephrosis.   2. Filling defect in the mid right ureter is nonspecific and  could represent a cast or urothelial thickening.  3. Status post gastric bypass.  4. Splenomegaly.     Presently the patient denies febrile.  Patient denies chills and denies flank pain.            The patient denies any difficulty voiding   Urinary incontinence: no  Hematuria: no  Abdominal Pain: no  Weight loss: no    Has history of renal stone disease: yes  Family history of renal stone disease: no  History of bowel disease (crohn's etc): s/p gastric bypass   Has history of gout: no    Family history of prostate cancer: no    Past Medical History   Diagnosis Date   ??? Kidney stone    ??? Hematuria, microscopic 01/04/2014     Cytology  10/15   atypical  May be reactive consider neoplastic        Past Surgical History   Procedure Laterality Date   ??? Hx orthopaedic     ??? Pr abdomen surgery proc unlisted     ??? Pr breast surgery procedure unlisted     ??? Hx heent         History     Social History   ??? Marital  Status: MARRIED     Spouse Name: N/A   ??? Number of Children: N/A   ??? Years of Education: N/A     Social History Main Topics   ??? Smoking status: Former Smoker     Quit date: 05/06/2010   ??? Smokeless tobacco: Never Used   ??? Alcohol Use: Yes   ??? Drug Use: No   ??? Sexual Activity:     Partners: Female     Other Topics Concern   ??? None     Social History Narrative       History reviewed. No pertinent family history.    Outpatient Encounter Prescriptions as of 06/28/2014   Medication Sig Dispense Refill   ??? potassium citrate (UROCIT-K 10) 10 mEq (1,080 mg) TbER Take 2 Tabs by mouth two (2) times a day. 90 Tab 3   ??? cyanocobalamin (VITAMIN B12) 1,000 mcg/mL injection 1,000 mcg by IntraMUSCular route once.     ??? venlafaxine-SR (EFFEXOR XR) 75 mg capsule Take  by mouth daily.     ??? cholecalciferol, VITAMIN D3, (VITAMIN D3) 5,000 unit tab tablet Take  by mouth daily.     ??? multivitamin (ONE A DAY) tablet Take 1 Tab by mouth daily.      ??? [DISCONTINUED] potassium citrate (UROCIT-K 10) 10 mEq (1,080 mg) TbER Take 2 Tabs by mouth two (2) times a day. 90 Tab 3   ??? [DISCONTINUED] oxyCODONE-acetaminophen (PERCOCET) 5-325 mg per tablet Take 1 Tab by mouth every four (4) hours as needed for Pain. Max Daily Amount: 6 Tabs. 30 Tab 0     No facility-administered encounter medications on file as of 06/28/2014.       Allergies   Allergen Reactions   ??? Codeine Unknown (comments)       Review of Systems  Constitutional: Fever: No  Skin: Rash: No  HEENT: Hearing difficulty: No  Eyes: Blurred vision: No  Cardiovascular: Chest pain: No  Respiratory: Shortness of breath: No  Gastrointestinal: Nausea/vomiting: No  Musculoskeletal: Back pain: No  Neurological: Weakness: No  Psychological: Memory loss: No  Comments/additional findings:     Physical Exam:  BP 136/88 mmHg   Ht 5\' 11"  (1.803 m)   Wt 215 lb (97.523 kg)   BMI 30.00 kg/m2  Alert oriented to person, place, and time  Well developed    UA:   Results for orders placed or performed in visit on 06/28/14   AMB POC URINALYSIS DIP STICK AUTO W/O MICRO   Result Value Ref Range    Color (UA POC) Yellow     Clarity (UA POC) Clear     Glucose (UA POC) Negative Negative    Bilirubin (UA POC) Negative Negative    Ketones (UA POC) Negative Negative    Specific gravity (UA POC) 1.030 1.001 - 1.035    Blood (UA POC) Trace Negative    pH (UA POC) 6.5 4.6 - 8.0    Protein (UA POC) Negative Negative mg/dL    Urobilinogen (UA POC) 0.2 mg/dL 0.2 - 1    Nitrites (UA POC) Negative Negative    Leukocyte esterase (UA POC) Negative Negative       ICD-10-CM ICD-9-CM    1. History of kidney stones Z87.442 V13.01 AMB POC XRAY, ABDOMEN; SINGLE ANTEROP      AMB POC URINALYSIS DIP STICK AUTO W/O MICRO   2. 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    I performed complete discussion and exam as noted above. All reviewed in detail.     Randa Ngo, MD on 06/28/2014

## 2014-08-22 NOTE — Telephone Encounter (Addendum)
Encounter opened in error

## 2014-12-10 ENCOUNTER — Ambulatory Visit
Admit: 2014-12-10 | Discharge: 2014-12-10 | Payer: PRIVATE HEALTH INSURANCE | Attending: Urology | Primary: Nurse Practitioner

## 2014-12-10 DIAGNOSIS — N200 Calculus of kidney: Secondary | ICD-10-CM

## 2014-12-10 NOTE — Progress Notes (Signed)
[facility] Clinical Research Protocol Visit    Study Participant in Hyperoxaluria clinical study    Assessment:  1. 54 y.o. white male w/ h/o kidney stone and hyperoxaluria   2. S/p right PCNL by Dr. Winnifred Friar in January 2016    Plan:  Continue participation in clinical trial per study protocol    Subjective:  Travis Chung  Presents today for examination pursuant to participation in [facility] clinical research protocol for Hyperoxaluria.  At this time the patient does not have symptoms.      negative for frequency, dysuria, nocturia, hesitancy and hematuria    Objective:    WDWN in No Acute distress Alert and oriented x 3  chest clear, no wheezing, rales, normal symmetric air entry  Heart exam - S1, S2 normal, no murmur, no gallop, rate regular  soft, non-tender. Bowel sounds normal. No masses,  no organomegaly  symmetric, no curvature. ROM normal. No CVA tenderness.  extremities normal, atraumatic, no cyanosis or edema    Results for orders placed or performed in visit on 06/28/14   AMB POC URINALYSIS DIP STICK AUTO W/O MICRO   Result Value Ref Range    Color (UA POC) Yellow     Clarity (UA POC) Clear     Glucose (UA POC) Negative Negative    Bilirubin (UA POC) Negative Negative    Ketones (UA POC) Negative Negative    Specific gravity (UA POC) 1.030 1.001 - 1.035    Blood (UA POC) Trace Negative    pH (UA POC) 6.5 4.6 - 8.0    Protein (UA POC) Negative Negative mg/dL    Urobilinogen (UA POC) 0.2 mg/dL 0.2 - 1    Nitrites (UA POC) Negative Negative    Leukocyte esterase (UA POC) Negative Negative       @    CBC w/Diff    No results found for: WBC, RBC, HCT, MCV, MCH, MCHC, RDW, HCTEXT, HCTEXT No results found for: BANDS, LYMPHOCYTES, MONOS, EOS, BASOS, BLAST, RDW     Comprehensive Metabolic Profile    Lab Results   Component Value Date/Time    NA 142 06/27/2008 03:49 PM    CO2 23 06/27/2008 03:49 PM    BUN 18 06/27/2008 03:49 PM    No results found for: ALBUMIN     Romualdo Bolk MD   Assistant Professor, Dept of Urology  [facility]  [facility] [place] [place]  Pager #: [phone]        Medical Documentation is provided with the assistance of Taj-ud-Din Leanna Sato, Medical Scribe for Jason Fila, MD on 12/10/2014

## 2014-12-11 ENCOUNTER — Ambulatory Visit
Admit: 2014-12-11 | Discharge: 2014-12-11 | Payer: PRIVATE HEALTH INSURANCE | Attending: Urology | Primary: Nurse Practitioner

## 2014-12-11 DIAGNOSIS — I861 Scrotal varices: Secondary | ICD-10-CM

## 2014-12-11 NOTE — Progress Notes (Signed)
Travis Chung  [birth year]    Assessment:    ICD-10-CM ICD-9-CM    1. Left varicocele I86.1 456.4 US SCROTUM/TESTICLES       Plan:  1. Scrotal sono     Chief Complaint   Patient presents with   ??? Testicle Pain     Varicoele     History of Present Illness: Travis Chung is a 54 y.o. 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  ICD-9-CM: 791.9  07/28/14        Hematuria, microscopic ICD-10-CM: R31.2  ICD-9-CM: 599.72  01/04/2014    Overview Addendum 01/24/2014  5:26 PM by Alesia Banda, MD     Cytology  10/15   atypical  May be reactive consider neoplastic   Cytology 10/15  favor neoplastic  Fish -Neg              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 Mayford Knife .       AUA Assessment Score:  AUA Score: 4;    AUA Bother Rating: Pleased               Past Medical History   Diagnosis Date   ??? Hematuria, microscopic 01/04/2014     Cytology  10/15   atypical  May be reactive consider neoplastic    ??? Kidney stone        Past Surgical History   Procedure Laterality Date   ??? Hx orthopaedic     ??? Pr abdomen surgery proc unlisted     ??? Pr breast surgery procedure unlisted      ??? Hx heent         Social History     Social History   ??? Marital status: MARRIED     Spouse name: N/A   ??? Number of children: N/A   ??? Years of education: N/A     Social History Main Topics   ??? Smoking status: Former Smoker     Quit date: 05/06/2010   ??? Smokeless tobacco: Never Used   ??? Alcohol use Yes   ??? Drug use: No   ??? Sexual activity: Yes  Partners: Female     Other Topics Concern   ??? None     Social History Narrative       History reviewed. No pertinent family history.    Outpatient Encounter Prescriptions as of 12/11/2014   Medication Sig Dispense Refill   ??? escitalopram oxalate (LEXAPRO) 20 mg tablet Take 20 mg by mouth daily.     ??? divalproex ER (DEPAKOTE ER) 500 mg ER tablet Take 2,000 mg by mouth nightly.     ??? prazosin (MINIPRESS) 2 mg capsule Take 2 mg by mouth nightly.     ??? traZODone (DESYREL) 100 mg tablet Take 200 mg by mouth nightly.     ??? potassium citrate (UROCIT-K 10) 10 mEq (1,080 mg) TbER Take 2 Tabs by mouth two (2) times a day. 90 Tab 3   ??? cyanocobalamin (VITAMIN B12) 1,000 mcg/mL injection 1,000 mcg by IntraMUSCular route once.     ??? venlafaxine-SR (EFFEXOR XR) 75 mg capsule Take  by mouth daily.     ??? cholecalciferol, VITAMIN D3, (VITAMIN D3) 5,000 unit tab tablet Take  by mouth daily.     ??? multivitamin (ONE A DAY) tablet Take 1 Tab by mouth daily.       No facility-administered encounter medications on file as of 12/11/2014.        Allergies   Allergen Reactions   ??? Codeine Unknown (comments)       Review of Systems  Constitutional: Fever: No  Skin: Rash: No  HEENT: Hearing difficulty: No  Eyes: Blurred vision: No  Cardiovascular: Chest pain: No  Respiratory: Shortness of breath: No  Gastrointestinal: Nausea/vomiting: No  Musculoskeletal: Back pain: No  Neurological: Weakness: No  Psychological: Memory loss: No  Comments/additional findings:   All other systems reviewed and are negative        Physical Exam:  Visit Vitals   ??? BP 118/70   ??? Ht  (1.803 m)   ??? Wt 225 lb (102.1 kg)    ??? BMI 31.38 kg/m2     Constitutional: WDWN, Pleasant and appropriate affect, No acute distress.    CV:  No peripheral swelling noted.  Respiratory: No respiratory distress or difficulties.  Abdomen:  No abdominal masses or tenderness. No CVA tenderness. No inguinal hernia noted.   GU Male:    [id number] normal to visual inspection, no erythema or irritation, Sphincter with good tone, Rectum with no hemorrhoids, fissures or masses, Prostate g, smooth, symmetric and anodular.  SCROTUM:  No scrotal rash or lesions noticed.  Normal bilateral testes and epididymis. ? Small varicocele , not sig     PENIS: Urethral meatus normal in location and size. No urethral discharge.  Skin: Normal color. No jaundice.    Neuro/Psych:  Alert and oriented x3, affect appropriate.   Lymphatic:   No enlarged inguinal lymph nodes.      UA:   Results for orders placed or performed in visit on 06/28/14   AMB POC URINALYSIS DIP STICK AUTO W/O MICRO   Result Value Ref Range    Color (UA POC) Yellow     Clarity (UA POC) Clear     Glucose (UA POC) Negative Negative    Bilirubin (UA POC) Negative Negative    Ketones (UA POC) Negative Negative    Specific gravity (UA POC) 1.030 1.001 - 1.035    Blood (UA POC) Trace Negative    pH (UA POC) 6.5 4.6 - 8.0    Protein (UA POC) Negative Negative mg/dL  Urobilinogen (UA POC) 0.2 mg/dL 0.2 - 1    Nitrites (UA POC) Negative Negative    Leukocyte esterase (UA POC) Negative Negative       Alesia Banda, MD on 12/11/2014

## 2014-12-11 NOTE — Progress Notes (Addendum)
The informed consent (V 2 DATED 10/03/2014) for a clinical trial for subjects with Hyperoxaluria was presented. (The subject had previously been given/mailed a copy of the ICF to review.) The subject was given time to read/review the ICF.  All questions were asked and answers were reviewed by the Investigator and the Coordinator with the subject to their satisfaction.  The subject has expressed the desire to participate in the clinical trial.  The subject has signed and dated the informed consent with all other parties.  A copy of the signed and dated ICF has been given to the subject for home files.  Screening procedures were initiated after the informed consent was signed and dated by all parties.        The subject is here today for a screening visit in a clinical trial for kidney stones and hyperoxaluria. The study coordinator obtained the medical history and medication history from the subject and from medical records that were available. The study coordinator confirmed with the subject that the medical history and medication history were correct to the best of their knowledge. Vital signs were obtained..  Visit Vitals   ??? BP 122/54 (BP 1 Location: Right arm, BP Patient Position: Sitting)   ??? Pulse (!) 58   ??? Temp 97.6 ??F (36.4 ??C) (Oral)   ??? Resp 12   ??? Ht  (1.803 m)   ??? Wt 225 lb 6.4 oz (102.2 kg)   ??? BMI 31.44 kg/m2      Labs were drawn, processed and sent to the laboratory for resulting. EKG was performed. Physical exam was performed by Dr. Lalla Brothers. The subject was instructed on how to complete the 24 hour urine testing and the diet recall phone calls. The subject was provided with all study materials needed.    For Complete Documentation of this Clinical Trial Visit See The Source Document For The Study Subject As The First Place For Documentation Is The Source Document      Rob Bunting

## 2014-12-11 NOTE — Progress Notes (Signed)
AUA Assessment Score:  AUA Score: 4;    AUA Bother Rating: Pleased  Visit Vitals   ??? BP 118/70   ??? Ht  (1.803 m)   ??? Wt 225 lb (102.1 kg)   ??? BMI 31.38 kg/m2

## 2014-12-12 NOTE — Telephone Encounter (Signed)
Patient called stating that he was on day 2 of his 2 24h urine collections. The first was completed  this am and shipped it off as directed.  He states that he is already 3/4 of the way filled on the 2nd jug for today's collection and is concerned that he will fill the jug before the end of his collection time.     Phone call made to the sponsor, instructions were given for the patient to be given a 2nd jug and for him to complete the remaining portion of his 24h collection in the new jug and mail it off just like the first, but NOT to mix any urine from the jugs together.     Telephone call placed to the patient to advise of the above. He states that he will not drive from [place] to pick up another jug and will "make due" with what he has.     Lead coordinator will be notified.     Quillian Quince, CCRP

## 2014-12-18 ENCOUNTER — Encounter

## 2014-12-18 NOTE — Progress Notes (Signed)
Call pt   No ultrasonographic  Evidence of a varicocele

## 2014-12-19 NOTE — Progress Notes (Signed)
Spoke with patient and gave him testicular US results.

## 2015-01-04 ENCOUNTER — Ambulatory Visit: Admit: 2015-01-04 | Discharge: 2015-01-04 | Payer: PRIVATE HEALTH INSURANCE | Primary: Nurse Practitioner

## 2015-01-04 DIAGNOSIS — R82992 Hyperoxaluria: Secondary | ICD-10-CM

## 2015-01-04 NOTE — Progress Notes (Signed)
The subject is here for the baseline visit (Day -1) in a clinical trial for hyperoxaluria. The subject states no change in his health and no changes in his concomitant medications. Vital signs were obtained. Labs were drawn, processed and sent for resulting. The subject was given 24 hr urine collection supplies and instructions. The subject was given study IP and dose instructions. The subject will return for next study visit.     Visit Vitals   ??? BP 112/80 (BP 1 Location: Right arm, BP Patient Position: Sitting)   ??? Pulse (!) 54   ??? Temp 98.3 ??F (36.8 ??C) (Oral)   ??? Resp 12   ??? Ht 5\' 11"  (1.803 m)   ??? Wt 225 lb (102.1 kg)   ??? BMI 31.38 kg/m2           For Complete Documentation of this Clinical Trial Visit See The Source Document For The Study Subject As The First Place For Documentation Is The Source Document.      Rob BuntingMichele L Eusebia Grulke

## 2015-01-11 NOTE — Addendum Note (Signed)
Addended by: Bing QuarrySCHINDLER, Ellisa Devivo L on: 01/11/2015 10:40 AM      Modules accepted: Orders

## 2015-01-14 ENCOUNTER — Ambulatory Visit: Admit: 2015-01-14 | Discharge: 2015-01-14 | Payer: PRIVATE HEALTH INSURANCE | Primary: Nurse Practitioner

## 2015-01-14 DIAGNOSIS — R82992 Hyperoxaluria: Secondary | ICD-10-CM

## 2015-01-14 NOTE — Progress Notes (Signed)
The subject is here today for Visit Day 10 in a clinical trial for hyperoxaluria.The subject stated no changes in his health and no changes in his concomitant medications.  Vital signs were obtained. Labs were drawn, processed and sent for resulting. The subject stated he was compliant with IP.  The subject was given 24 hr urine supplies.       Visit Vitals   ??? BP 130/70 (BP 1 Location: Left arm, BP Patient Position: Sitting)   ??? Pulse 60   ??? Temp 97.7 ??F (36.5 ??C) (Oral)   ??? Resp 12   ??? Ht 5\' 11"  (1.803 m)   ??? Wt 226 lb 9.6 oz (102.8 kg)   ??? BMI 31.6 kg/m2       For Complete Documentation of this Clinical Trial Visit See The Source Document For The Study Subject As The First Place For Documentation Is The Source Document.

## 2015-01-17 ENCOUNTER — Encounter: Attending: Urology | Primary: Nurse Practitioner

## 2015-01-17 ENCOUNTER — Encounter: Primary: Nurse Practitioner

## 2015-01-21 ENCOUNTER — Ambulatory Visit: Admit: 2015-01-21 | Discharge: 2015-01-21 | Payer: PRIVATE HEALTH INSURANCE | Primary: Nurse Practitioner

## 2015-01-21 DIAGNOSIS — R82992 Hyperoxaluria: Secondary | ICD-10-CM

## 2015-01-21 NOTE — Progress Notes (Signed)
The subject is here for day 17 in a clinical trial for hyperoxaluria. The subject stated no changes in his health and no changes in his concomitant medications. Vitals signs were obtained. The subject returned IP and new IP was given to the subject. The subject was given 24 hr urine supplies and instructions.      Visit Vitals   ??? BP 110/70 (BP 1 Location: Left arm, BP Patient Position: Sitting)   ??? Pulse 60   ??? Temp 98.3 ??F (36.8 ??C) (Oral)   ??? Wt 226 lb (102.5 kg)   ??? BMI 31.52 kg/m2       For Complete Documentation of this Clinical Trial Visit See The Source Document For The Study Subject As The First Place For Documentation Is The Source Document.      Rob BuntingMichele L Kadisha Goodine

## 2015-01-28 ENCOUNTER — Encounter: Primary: Nurse Practitioner

## 2015-02-04 ENCOUNTER — Ambulatory Visit: Admit: 2015-02-04 | Discharge: 2015-02-04 | Payer: PRIVATE HEALTH INSURANCE | Primary: Nurse Practitioner

## 2015-02-04 DIAGNOSIS — R82992 Hyperoxaluria: Secondary | ICD-10-CM

## 2015-02-04 NOTE — Progress Notes (Signed)
The subject is here for Day 31 visit in a clinical trial for hyperoxaluria. The subject states no changes in his health and no changes in concomitant medications. Vital signs were obtained. Labs were drawn, processed and sent to the laboratory for resulting. The subject was given 24 hr urine supplies and instructions. IP accountability performed and medication diary reviewed.    Visit Vitals   ??? BP 110/70 (BP 1 Location: Left arm, BP Patient Position: Sitting)   ??? Pulse (!) 56   ??? Temp 98 ??F (36.7 ??C) (Oral)   ??? Wt 227 lb 3.2 oz (103.1 kg)   ??? BMI 31.69 kg/m2           For Complete Documentation of this Clinical Trial Visit See The Source Document For The Study Subject As The First Place For Documentation Is The Source Document.      Travis BuntingMichele L Chrishana Chung

## 2015-02-11 ENCOUNTER — Ambulatory Visit
Admit: 2015-02-11 | Discharge: 2015-02-11 | Payer: PRIVATE HEALTH INSURANCE | Attending: Urology | Primary: Nurse Practitioner

## 2015-02-11 DIAGNOSIS — R82992 Hyperoxaluria: Secondary | ICD-10-CM

## 2015-02-11 NOTE — Addendum Note (Signed)
Addended by: Bing QuarrySCHINDLER, Djeneba Barsch L on: 02/11/2015 03:25 PM      Modules accepted: Level of Service

## 2015-02-11 NOTE — Progress Notes (Signed)
[facility] Clinical Research Protocol Visit    Study Participant in Hyperoxaluria clinical study    Assessment:  1. 54 y.o. white male w/ h/o kidney stone and hyperoxaluria   2. S/p right PCNL by Dr. Winnifred Friarobey in January 2016    Plan:  Patient has completed clinical trial and will f/u with Dr. Winnifred Friarobey     Subjective:  Travis Chung  Presents today for examination pursuant to participation in [facility] clinical research protocol for Hyperoxaluria.  At this time the patient does not have symptoms.      negative for frequency, dysuria, nocturia, hesitancy and hematuria    Objective:  WDWN in No Acute distress Alert and oriented x 3  chest clear, no wheezing, rales, normal symmetric air entry  Heart exam - RRR  soft, non-tender. Bowel sounds normal. No masses,  no organomegaly  symmetric, no curvature. ROM normal. No CVA tenderness.  extremities normal, atraumatic, no cyanosis or edema    LABS:  None      CBC w/Diff    No results found for: WBC, RBC, HCT, MCV, MCH, MCHC, RDW, HCTEXT, HCTEXT No results found for: BANDS, LYMPHOCYTES, MONOS, EOS, BASOS, BLAST, RDW     Comprehensive Metabolic Profile    Lab Results   Component Value Date/Time    NA 142 06/27/2008 03:49 PM    CO2 23 06/27/2008 03:49 PM    BUN 18 06/27/2008 03:49 PM    No results found for: ALBUMIN     Romualdo BolkJack Lambert MD  Assistant Professor, Dept of Urology  [facility]  [facility] [place]  Pager #: [phone]        Medical Documentation is provided with the assistance of Taj-ud-Din Leanna SatoJumanah, Medical Scribe for Jason FilaJack W Lambert, MD on 02/11/2015

## 2015-02-11 NOTE — Progress Notes (Addendum)
The subject is here today for Visit Day 38 (EOS). The subject stated there were no changes in his health and no changes in his concomitant medications. Vital signs were obtained. Labs were drawn processed and sent to the appropriate lab for resulting. An ECG was performed per protocol. A physical exam was performed. The subject stated that he returned all study supplies and remaining IP. The subject will follow up with Dr. Winnifred Friarobey.      For Complete Documentation of this Clinical Trial Visit See The Source Document For The Study Subject As The First Place For Documentation Is The Source Document.    Visit Vitals   ??? BP 120/70 (BP 1 Location: Left arm, BP Patient Position: Sitting)   ??? Pulse (!) 51   ??? Temp 97.9 ??F (36.6 ??C) (Oral)   ??? Wt 228 lb 6.4 oz (103.6 kg)   ??? BMI 31.86 kg/m2

## 2015-03-21 ENCOUNTER — Encounter: Attending: Urology | Primary: Nurse Practitioner

## 2015-04-15 ENCOUNTER — Encounter

## 2016-03-23 HISTORY — PX: CERVICAL SPINE SURGERY: SHX589

## 2016-09-01 NOTE — Telephone Encounter (Signed)
I scheduled an appt for an EP of Dr. Allyne GeeSanders for Monday, 09/07/2016 for dx: Right renal calculus Referred by Dr. Kizzie Ideavid P. Bayne.     Please put in order for KUB so that he can have it done at [facility] on same day.    Thank you.

## 2016-09-07 ENCOUNTER — Encounter

## 2016-09-07 ENCOUNTER — Ambulatory Visit: Admit: 2016-09-07 | Discharge: 2016-09-07 | Attending: Urology | Primary: Nurse Practitioner

## 2016-09-07 DIAGNOSIS — R3129 Other microscopic hematuria: Secondary | ICD-10-CM

## 2016-09-07 LAB — AMB POC URINALYSIS DIP STICK AUTO W/ MICRO (MICRO RESULTS)
Bilirubin (UA POC): NEGATIVE
Crystals (UA POC): NEGATIVE
Glucose (UA POC): NEGATIVE
Ketones (UA POC): NEGATIVE
Leukocyte esterase (UA POC): NEGATIVE
Nitrites (UA POC): NEGATIVE
Protein (UA POC): NEGATIVE
Specific gravity (UA POC): 1.025 (ref 1.001–1.035)
Urobilinogen (UA POC): 0.2 (ref 0.2–1)
WBCs (UA POC): 0
pH (UA POC): 5.5 (ref 4.6–8.0)

## 2016-09-07 NOTE — Progress Notes (Signed)
Travis Chung  May 03, 1960    Assessment:    ICD-10-CM ICD-9-CM    1. History of kidney stones Z87.442 V13.01 AMB POC URINALYSIS DIP STICK AUTO W/ MICRO (MICRO RESULTS)       Plan:  1. Right  7x4 mm  ESWL     The risks of ESWL includes but is not limited to hemorrhage which in extremely rare  cases  may  require transfusion, embolization, or even nephrectomy.  More common  side effects would be , pain , infection, the inability to pass retained fragments which may or may not require secondary procedure, Steinstrasse, and incomplete fragmentation of the stone which may require addition litho sessions.  The pt also understands that using litho may not remedy the pain.  The pt understands this and desires to proceed.  All questions were answered.       Chief Complaint   Patient presents with   ??? Kidney Stone     Pt here for kidney stone. pt stats he passed a today. pt c/o back pain x 1 day.      History of Present Illness: Travis Chung is a 56 y.o. male who presents today for RLP  7x4 mm  Stone  .  H/o  urteroscopy ,  HM#  Litho , PCNL       FOS good  , Nocturia n , dysuria n , frequency n , urgency n, hematuria n  Sees  DR   Judeth Horn  For DRE / PSA   (  0.5 )       Problem List  Date Reviewed: 07-23-14          Codes Class Noted    History of kidney stones ICD-10-CM: Z87.442  ICD-9-CM: V13.01  Jul 23, 2014    Overview Addendum 09/07/2016  8:34 AM by Alesia Banda, MD     CT  10/15 Kidneys: There is a large staghorn calculus in the right lower  pole renal collecting system measuring 2.7 x 2.4 x 2.4 cm. An  additional calyceal stone is noted measuring 1.0 x 0.7 x 0.8 cm.  There is mild right upper pole hydronephrosis.   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   ICD-9-CM: 791.9  Jul 23, 2014        Hematuria, microscopic ICD-10-CM: R31.29  ICD-9-CM: 599.72  01/04/2014    Overview Addendum 01/24/2014  5:26 PM by Alesia Banda, MD     Cytology  10/15   atypical  May be reactive consider neoplastic   Cytology 10/15  favor neoplastic  Fish -Neg              Low serum testosterone level ICD-10-CM: R79.89  ICD-9-CM: 790.99  03/11/2012    Overview Addendum 06/09/2012  7:58 AM by Alesia Banda, MD     Testosterone     620 / 9.1Free   2/14   Testosterone 400/ 5.8 Free 11/13  Before testim   PSA 0.47   Had blood work  Ok at [employer] office   TRUS / Bx   Neg 3/14  We are asked to see for evaluation and management by Dr. Mayford Knife  .       Discussed the patient's BMI with him.  The BMI follow up plan is as follows: BMI is out of normal parameters and plan is as follows: I have counseled this patient on diet and exercise regimens         AUA Assessment Score:  AUA Score: 8;    AUA Bother Rating: Mostly satisfied               Past Medical History:   Diagnosis Date   ??? Hematuria, microscopic 01/04/2014    Cytology  10/15   atypical  May be reactive consider neoplastic    ??? Kidney stone        Past Surgical History:   Procedure Laterality Date   ??? ABDOMEN SURGERY PROC UNLISTED     ??? BREAST SURGERY PROCEDURE UNLISTED     ??? HX HEENT     ??? HX ORTHOPAEDIC         Social History     Social History   ??? Marital status: MARRIED     Spouse name: N/A   ??? Number of children: N/A   ??? Years of education: N/A     Social History Main Topics   ??? Smoking status: Former Smoker     Quit date: 05/06/2010   ??? Smokeless tobacco: Never Used   ??? Alcohol use Yes   ??? Drug use: No   ??? Sexual activity: Yes     Partners: Female     Other Topics Concern   ??? None     Social History Narrative       History reviewed. No pertinent family history.    Outpatient Encounter Prescriptions as of 09/07/2016   Medication Sig Dispense Refill   ??? temazepam (RESTORIL) 30 mg capsule Take  by mouth nightly as needed for  Sleep.     ??? venlafaxine-SR (EFFEXOR XR) 75 mg capsule Take  by mouth daily.     ??? multivitamin (ONE A DAY) tablet Take 1 Tab by mouth daily.     ??? [DISCONTINUED] escitalopram oxalate (LEXAPRO) 20 mg tablet Take 20 mg by mouth daily.     ??? [DISCONTINUED] divalproex ER (DEPAKOTE ER) 500 mg ER tablet Take 2,000 mg by mouth nightly.     ??? [DISCONTINUED] prazosin (MINIPRESS) 2 mg capsule Take 2 mg by mouth nightly.     ??? [DISCONTINUED] traZODone (DESYREL) 100 mg tablet Take 200 mg by mouth nightly.     ??? [DISCONTINUED] potassium citrate (UROCIT-K 10) 10 mEq (1,080 mg) TbER Take 2 Tabs by mouth two (2) times a day. 90 Tab 3   ??? [DISCONTINUED] cyanocobalamin (VITAMIN B12) 1,000 mcg/mL injection 1,000 mcg by IntraMUSCular route once.     ??? [DISCONTINUED] cholecalciferol, VITAMIN D3, (VITAMIN D3) 5,000 unit tab tablet Take  by mouth daily.       No facility-administered encounter medications on file as of 09/07/2016.        Allergies   Allergen Reactions   ??? Codeine Unknown (comments)       Review of Systems  Constitutional: Fever: No  Skin: Rash: No  HEENT: Hearing difficulty: No  Eyes: Blurred vision: No  Cardiovascular: Chest pain: No  Respiratory: Shortness of breath: No  Gastrointestinal: Nausea/vomiting: No  Musculoskeletal: Back pain: No  Neurological: Weakness: No  Psychological: Memory loss: No  Comments/additional findings:   All other systems reviewed and are negative  Physical Exam:  Visit Vitals   ??? BP 118/80   ??? Ht 5\' 11"  (1.803 m)   ??? Wt 214 lb (97.1 kg)   ??? BMI 29.85 kg/m2     Constitutional: WDWN, Pleasant and appropriate affect, No acute distress.    CV:  No peripheral swelling noted.  Respiratory: No respiratory distress or difficulties.  Abdomen:  No abdominal masses or tenderness. No CVA tenderness. No inguinal hernia noted.   GU Male:    Skin: Normal color. No jaundice.    Neuro/Psych:  Alert and oriented x3, affect appropriate.   Lymphatic:   No enlarged inguinal lymph nodes.      UA:    Results for orders placed or performed in visit on 09/07/16   AMB POC URINALYSIS DIP STICK AUTO W/ MICRO (MICRO RESULTS)   Result Value Ref Range    Color (UA POC) Yellow     Clarity (UA POC) Clear     Glucose (UA POC) Negative Negative    Bilirubin (UA POC) Negative Negative    Ketones (UA POC) Negative Negative    Specific gravity (UA POC) 1.025 1.001 - 1.035    Blood (UA POC) 2+ Negative    pH (UA POC) 5.5 4.6 - 8.0    Protein (UA POC) Negative Negative    Urobilinogen (UA POC) 0.2 mg/dL 0.2 - 1    Nitrites (UA POC) Negative Negative    Leukocyte esterase (UA POC) Negative Negative    Epithelial cells (UA POC)      WBCs (UA POC)      RBCs (UA POC)      Bacteria (UA POC)  Negative    Crystals (UA POC)  Negative    Other (UA POC)         Alesia BandaKevin W Kaylinn Dedic, MD on 09/07/2016

## 2016-09-07 NOTE — Progress Notes (Signed)
Pt [facility] for his KUB. They did not have the order. I contacted Dr. Zella BallSander's office, Leeroy BockChelsea will be faxing the order to [phone].

## 2016-09-07 NOTE — Progress Notes (Signed)
KUB was not done at [facility], since [facility] the eval and treatment for patient.  [facility] was requiring a payment.  Pt will be keeping his appt with Dr. Allyne GeeSanders, though.

## 2016-09-09 LAB — CULTURE, URINE

## 2016-09-22 ENCOUNTER — Institutional Professional Consult (permissible substitution): Admit: 2016-09-22 | Discharge: 2016-09-22 | Primary: Nurse Practitioner

## 2016-09-22 DIAGNOSIS — Z87442 Personal history of urinary calculi: Secondary | ICD-10-CM

## 2016-09-22 LAB — AMB POC URINALYSIS DIP STICK AUTO W/ MICRO (MICRO RESULTS)
Bilirubin (UA POC): NEGATIVE
Blood (UA POC): NEGATIVE
Crystals (UA POC): NEGATIVE
Epithelial cells (UA POC): NEGATIVE
Glucose (UA POC): NEGATIVE
Ketones (UA POC): NEGATIVE
Leukocyte esterase (UA POC): NEGATIVE
Nitrites (UA POC): NEGATIVE
Protein (UA POC): NEGATIVE
RBCs (UA POC): 0
Specific gravity (UA POC): 1.02 (ref 1.001–1.035)
Urobilinogen (UA POC): 0.2 (ref 0.2–1)
WBCs (UA POC): 0
pH (UA POC): 5.5 (ref 4.6–8.0)

## 2016-09-22 NOTE — Progress Notes (Addendum)
Tana ConchRobert Chung is here today per Dr. Allyne Geesanders to give a urine specimen.       He is here for pre-procedure culture      Urine is obtained from patient via clean catch .     Patient is symptomatic: no  Patient complains of: nothing   Patient denies any urinary sxs    Urine was sent for culture.   This is not a repeat urine culture.   UA performed: yes    Results for orders placed or performed in visit on 09/22/16   CULTURE, URINE   Result Value Ref Range    RESULT Normal    AMB POC URINALYSIS DIP STICK AUTO W/ MICRO (MICRO RESULTS)   Result Value Ref Range    Color (UA POC) Yellow     Clarity (UA POC) Clear     Glucose (UA POC) Negative Negative    Bilirubin (UA POC) Negative Negative    Ketones (UA POC) Negative Negative    Specific gravity (UA POC) 1.020 1.001 - 1.035    Blood (UA POC) Negative Negative    pH (UA POC) 5.5 4.6 - 8.0    Protein (UA POC) Negative Negative    Urobilinogen (UA POC) 0.2 mg/dL 0.2 - 1    Nitrites (UA POC) Negative Negative    Leukocyte esterase (UA POC) Negative Negative    Epithelial cells (UA POC) n     WBCs (UA POC) 0      RBCs (UA POC) 0      Bacteria (UA POC) None Negative    Crystals (UA POC) Negative Negative    Other (UA POC)          [facility]rine [facility] to [facility] for processing of urine culture.   Patient is informed that it will be at least 48 hours before results are available     Orders Placed This Encounter   ??? CULTURE, URINE   ??? AMB POC URINALYSIS DIP STICK AUTO W/ MICRO (MICRO RESULTS)       Travis CloudAnn M Jakira Chung

## 2016-09-24 LAB — CULTURE, URINE
RESULT: NORMAL
Result: NORMAL

## 2016-11-13 ENCOUNTER — Ambulatory Visit
Admit: 2016-11-13 | Discharge: 2016-11-13 | Payer: PRIVATE HEALTH INSURANCE | Attending: Urology | Primary: Nurse Practitioner

## 2016-11-13 DIAGNOSIS — N489 Disorder of penis, unspecified: Secondary | ICD-10-CM

## 2016-11-13 LAB — AMB POC URINALYSIS DIP STICK AUTO W/ MICRO (MICRO RESULTS)
Bilirubin (UA POC): NEGATIVE
Blood (UA POC): NEGATIVE
Crystals (UA POC): NEGATIVE
Epithelial cells (UA POC): NEGATIVE
Glucose (UA POC): NEGATIVE
Ketones (UA POC): NEGATIVE
Leukocyte esterase (UA POC): NEGATIVE
Nitrites (UA POC): NEGATIVE
Protein (UA POC): NEGATIVE
RBCs (UA POC): 0
Specific gravity (UA POC): 1.02 (ref 1.001–1.035)
Urobilinogen (UA POC): 0.2 (ref 0.2–1)
WBCs (UA POC): 0
pH (UA POC): 6 (ref 4.6–8.0)

## 2016-11-13 NOTE — Progress Notes (Signed)
AUA Assessment Score:  AUA Score: 10;    AUA Bother Rating: Mostly satisfied  Visit Vitals   ??? BP 122/60   ??? Ht 5\' 11"  (1.803 m)   ??? Wt 226 lb (102.5 kg)   ??? BMI 31.52 kg/m2     SHIM 11/13/2016   How do you rate your confidence that you could get and keep an erection? 5   When you had erections with sexual stimulation, how often were your erections hard enough for penetration? 5   During sexual intercourse, how often were you able to maintain your erection after you had penetrated (entered) your partner? 5   During sexual intercourse, how easy was it to maintain your erection to completion of intercourse? 5   When you attempted sexual intercourse, how often was it satisfactory for you? 5   SHIM Score 25

## 2016-11-13 NOTE — Progress Notes (Signed)
Travis Chung  1960/06/02    Assessment:    ICD-10-CM ICD-9-CM    1. Penile abnormality N48.9 607.9 AMB POC URINALYSIS DIP STICK AUTO W/ MICRO (MICRO RESULTS)       Plan:  1. Kidney stones- KUB 09/29/16 showed a 0.3 x 0.9 cm calculus projected over the right midpole.????Adjacent punctate calculi. Pelvic phleboliths. ESWL on 09/29/16 was canceled secondary to his stone not being clearly visualized.  [facility]. This will check for stones and he will send me the results    2. 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 Denies any curvature to the penis or ED issues, will continue to monitor     I had a long discussion with the patient regarding Peyronie's disease and its possible causes.  He understands that the treatment options vary from very conservative to much more aggressive options.  The most conservative option includes observation with the patient understanding that over time, some men will see improvement in the penile curvature.  Many other men may simply have stabilization of the curvature without further progression.  However, some men will see continued worsening of the curvature which may significantly impact their ability to have adequate intercourse.    Medication options include vitamin E where some studies have shown varied results.  We also discussed the use of POTABA (potassium amino benzoic acid) but the dose required has a significant chance of gastrointestinal side effects and efficacy is questionable.    We also discussed the off label use of verapamil cream although longer-term studies have not shown this to be very effective.      We also discussed the use of collagenase or verapamil injections.    Lastly, we discussed surgical options including the "Nesbitt tuck" which in essence is creating another constriction 180 degrees from his existing  scar tissue to straighten the penis.  This however can cause some penile shortening.  Another surgical option would be excision of the scarred area and grafting material to allow better expansion and a reversal of the curvature.  Although penile shortening is not as much of an issue with this surgery as with the Oswald Hillock, there may be higher risk of nerve injury or cavernosal injury and scarring which could lead to erectile dysfunction.    If he desires surgical treatment for perone's disease, or a trial of verapamil injections or collagenase, I can refer him to one of the partners at [facility] in our Reconstructive Department.    The patient understood these issues and all questions were answered.      Return in 6 months for a stone f/u        Chief Complaint   Patient presents with   ??? Other     Patient presents today with a complaints of a "lump" on the penis for about 2 1/2 weeks     History of Present Illness: Travis Chung is a 56 y.o. male who presents today with complaints of a penis "lump" first noticed a few days ago. Denies any overly bothersome pain or trauma to penis. Reports minimal intermittent discomfort of the penis.  Denies any curvature to the penis. Denies flank pain, gross hematuria, dysuria, asymptomatic for infection. No f/c/n/v. Reports mild R sided pain. Per patient he is [facility]. [facility]  Problem List  Date Reviewed: 06/28/2014          Codes Class Noted    Penile abnormality ICD-10-CM: N48.9  ICD-9-CM: 607.9  11/13/2016        History of kidney stones ICD-10-CM: Z87.442  ICD-9-CM: V13.01  06/28/2014    Overview Addendum 09/07/2016  8:34 AM by Alesia Banda, MD     CT  10/15 Kidneys: There is a large staghorn calculus in the right lower  pole renal collecting system measuring 2.7 x 2.4 x 2.4 cm. An  additional calyceal stone is noted measuring 1.0 x 0.7 x 0.8 cm.   There is mild right upper pole hydronephrosis.   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  ICD-9-CM: 791.9  06/28/2014        Hematuria, microscopic ICD-10-CM: R31.29  ICD-9-CM: 599.72  01/04/2014    Overview Addendum 01/24/2014  5:26 PM by Alesia Banda, MD     Cytology  10/15   atypical  May be reactive consider neoplastic   Cytology 10/15  favor neoplastic  Fish -Neg              Low serum testosterone level ICD-10-CM: R79.89  ICD-9-CM: 790.99  03/11/2012    Overview Addendum 06/09/2012  7:58 AM by Alesia Banda, MD     Testosterone     620 / 9.1Free   2/14   Testosterone 400/ 5.8 Free 11/13  Before testim   PSA 0.47   Had blood work  Ok at [employer] office   TRUS / Bx   Neg 3/14                      We are asked to see for evaluation and management by Dr. Mayford Knife  .       Discussed the patient's BMI with him.  The BMI follow up plan is as follows: BMI is out of normal parameters and plan is as follows: I have counseled this patient on diet and exercise regimens         AUA Assessment Score:  AUA Score: 10;    AUA Bother Rating: Mostly satisfied               Past Medical History:   Diagnosis Date   ??? Hematuria, microscopic 01/04/2014    Cytology  10/15   atypical  May be reactive consider neoplastic    ??? Insomnia    ??? Kidney stone    ??? Penile lump        Past Surgical History:   Procedure Laterality Date   ??? ABDOMEN SURGERY PROC UNLISTED     ??? BREAST SURGERY PROCEDURE UNLISTED     ??? HX HEENT     ??? HX ORTHOPAEDIC         Social History     Social History   ??? Marital status: MARRIED     Spouse name: N/A   ??? Number of children: N/A   ??? Years of education: N/A     Social History Main  Topics   ??? Smoking status: Former Smoker     Quit date: 05/06/2010    ??? Smokeless tobacco: Never Used   ??? Alcohol use Yes   ??? Drug use: No   ??? Sexual activity: Yes     Partners: Female     Other Topics Concern   ??? None     Social History Narrative       History reviewed. No pertinent family history.    Outpatient Encounter Prescriptions as of 11/13/2016   Medication Sig Dispense Refill   ??? venlafaxine-SR (EFFEXOR XR) 75 mg capsule Take  by mouth daily.     ??? multivitamin (ONE A DAY) tablet Take 1 Tab by mouth daily.     ??? [DISCONTINUED] temazepam (RESTORIL) 30 mg capsule Take  by mouth nightly as needed for Sleep.       No facility-administered encounter medications on file as of 11/13/2016.        Allergies   Allergen Reactions   ??? Codeine Unknown (comments)       Review of Systems  Constitutional: Fever:   Skin: Rash:   HEENT: Hearing difficulty:   Eyes: Blurred vision:   Cardiovascular: Chest pain:   Respiratory: Shortness of breath:   Gastrointestinal: Nausea/vomiting:   Musculoskeletal: Back pain:   Neurological: Weakness:   Psychological: Memory loss:   Comments/additional findings:   All other systems reviewed and are negative        Physical Exam:  Visit Vitals   ??? BP 122/60   ??? Ht 5\' 11"  (1.803 m)   ??? Wt 226 lb (102.5 kg)   ??? BMI 31.52 kg/m2     Constitutional: WDWN, Pleasant and appropriate affect, No acute distress.    CV:  No peripheral swelling noted.  Respiratory: No respiratory distress or difficulties.  Abdomen:  No abdominal masses or tenderness. No CVA tenderness. No inguinal hernia noted.   GU Male:      SCROTUM:  No scrotal rash or lesions noticed.  Normal bilateral testes and epididymis.   PENIS: distal thickened plaque consistent with Peyronie's disease       Skin: Normal color. No jaundice.    Neuro/Psych:  Alert and oriented x3, affect appropriate.   Lymphatic:   No enlarged inguinal lymph nodes.      UA:   Results for orders placed or performed in visit on 11/13/16   AMB POC URINALYSIS DIP STICK AUTO W/ MICRO (MICRO RESULTS)   Result Value Ref Range     Color (UA POC) Yellow     Clarity (UA POC) Clear     Glucose (UA POC) Negative Negative    Bilirubin (UA POC) Negative Negative    Ketones (UA POC) Negative Negative    Specific gravity (UA POC) 1.020 1.001 - 1.035    Blood (UA POC) Negative Negative    pH (UA POC) 6.0 4.6 - 8.0    Protein (UA POC) Negative Negative    Urobilinogen (UA POC) 0.2 mg/dL 0.2 - 1    Nitrites (UA POC) Negative Negative    Leukocyte esterase (UA POC) Negative Negative    Epithelial cells (UA POC) n     WBCs (UA POC) 0      RBCs (UA POC) 0      Bacteria (UA POC) None Negative    Crystals (UA POC) Negative Negative    Other (UA POC)           ABDOMEN/KUB7/12/2016  [facility]  Result Impression  Impression:  1.Right renal calculi   Result Narrative   Indication: RENAL STONE.    Comparison: 02/09/14    Findings: KUB of the abdomen was performed.????    Support devices: Surgical clips right upper quadrant.    Calcifications: There 0.3 x 0.9 cm calculus projected over the right midpole.????Adjacent punctate calculi.    Pelvic phleboliths.    Solid organs: Normal.    Bowel: Normal.     US TESTICULAR SCAN9/26/2016  [facility]  Result Impression   Impression:    1.????Left epididymal vessels are not dilated more than 3 mm to suggest a varicocele.   Result Narrative   No comparisons are available.    Indication:????Left varicocele; I86.1     Right testicle measures 4.2 x 3.2 x 2.9 cm and the echotexture is normal.????Small right hydrocele is present.????Color Doppler evaluation shows color Doppler flow in the right testicle without hyperemia.????Spectral Doppler analysis shows arterial and venous Doppler waveforms.    Right epididymal head measures 1 cm.????There is a 4.9 mm cyst or spermatocele in the right epididymis.    The left testicle has a normal echotexture and measures 4.1 x 3.4 x 2.4 cm and the echotexture is normal.????Color Doppler evaluation shows color Doppler flow without hyperemia.????Spectral Doppler analysis demonstrates  arterial and venous Doppler waveforms.    Left epididymal head measures 7 mm. Left epididymal vessels measure up to 3 mm transverse even during Valsalva maneuvers.    Minimal left hydrocele is present.         Dr. Drema Halon on 11/13/2016                Documentation was provided with the assistance of Lewanda Rife, medical scribe for Dr. Drema Halon on 11/13/2016.

## 2020-02-07 ENCOUNTER — Other Ambulatory Visit (HOSPITAL_COMMUNITY): Payer: Self-pay | Admitting: Nurse Practitioner

## 2020-02-07 ENCOUNTER — Other Ambulatory Visit: Payer: Self-pay | Admitting: Family

## 2020-02-07 ENCOUNTER — Other Ambulatory Visit: Payer: Self-pay | Admitting: Nurse Practitioner

## 2020-02-07 DIAGNOSIS — R109 Unspecified abdominal pain: Secondary | ICD-10-CM

## 2020-02-29 ENCOUNTER — Other Ambulatory Visit: Payer: Self-pay

## 2020-02-29 ENCOUNTER — Ambulatory Visit (HOSPITAL_COMMUNITY)
Admission: RE | Admit: 2020-02-29 | Discharge: 2020-02-29 | Disposition: A | Discharge status: Home / Self Care | Payer: No Typology Code available for payment source | Source: Ambulatory Visit | Attending: Nurse Practitioner | Admitting: Nurse Practitioner

## 2020-02-29 DIAGNOSIS — R109 Unspecified abdominal pain: Secondary | ICD-10-CM | POA: Diagnosis present

## 2020-05-20 ENCOUNTER — Encounter (HOSPITAL_COMMUNITY): Payer: Self-pay

## 2020-05-20 ENCOUNTER — Other Ambulatory Visit: Payer: Self-pay

## 2020-05-20 ENCOUNTER — Other Ambulatory Visit (HOSPITAL_COMMUNITY)
Admission: RE | Admit: 2020-05-20 | Discharge: 2020-05-20 | Disposition: A | Discharge status: Home / Self Care | Payer: No Typology Code available for payment source | Source: Ambulatory Visit | Attending: Urology | Admitting: Urology

## 2020-05-20 ENCOUNTER — Ambulatory Visit (HOSPITAL_COMMUNITY)
Admission: RE | Admit: 2020-05-20 | Discharge: 2020-05-20 | Disposition: A | Discharge status: Home / Self Care | Payer: No Typology Code available for payment source | Source: Ambulatory Visit | Attending: Urology | Admitting: Urology

## 2020-05-20 ENCOUNTER — Encounter (HOSPITAL_COMMUNITY)
Admission: RE | Admit: 2020-05-20 | Discharge: 2020-05-20 | Disposition: A | Discharge status: Home / Self Care | Payer: No Typology Code available for payment source | Source: Ambulatory Visit | Attending: Urology | Admitting: Urology

## 2020-05-20 ENCOUNTER — Ambulatory Visit (INDEPENDENT_AMBULATORY_CARE_PROVIDER_SITE_OTHER): Payer: No Typology Code available for payment source | Admitting: Urology

## 2020-05-20 ENCOUNTER — Encounter: Payer: Self-pay | Admitting: Urology

## 2020-05-20 VITALS — BP 124/82 | HR 68 | Temp 98.3°F | Ht 71.0 in | Wt 250.0 lb

## 2020-05-20 DIAGNOSIS — N200 Calculus of kidney: Secondary | ICD-10-CM

## 2020-05-20 HISTORY — DX: Unspecified osteoarthritis, unspecified site: M19.90

## 2020-05-20 LAB — URINALYSIS, ROUTINE W REFLEX MICROSCOPIC
Bilirubin, UA: NEGATIVE
Glucose, UA: NEGATIVE
Ketones, UA: NEGATIVE
Leukocytes,UA: NEGATIVE
Nitrite, UA: NEGATIVE
Protein,UA: NEGATIVE
RBC, UA: NEGATIVE
Specific Gravity, UA: 1.02 (ref 1.005–1.030)
Urobilinogen, Ur: 0.2 mg/dL (ref 0.2–1.0)
pH, UA: 5 (ref 5.0–7.5)

## 2020-05-20 NOTE — Patient Instructions (Signed)
William Farley  05/20/2020     @PREFPERIOPPHARMACY @   Your procedure is scheduled on Tomorrow at 0900.  Report to [facility] at 0900 A.M.  Call this number if you have problems the morning of surgery:  [phone]   Remember:  Do not eat or drink after midnight.      Take these medicines the morning of surgery with A SIP OF WATER klonopin if needed, omeprazole and paxil    Do not wear jewelry, make-up or nail polish.  Do not wear lotions, powders, or perfumes, or deodorant.  Do not shave 48 hours prior to surgery.  Men may shave face and neck.  Do not bring valuables to the hospital.  [facility] is not responsible for any belongings or valuables.  Contacts, dentures or bridgework may not be worn into surgery.  Leave your suitcase in the car.  After surgery it may be brought to your room.  For patients admitted to the hospital, discharge time will be determined by your treatment team.  Patients discharged the day of surgery will not be allowed to drive home.   Name and phone number of your driver:   driver Special instructions:  Please bring your blue folder  Please read over the following fact sheets that you were given. Surgical Site Infection Prevention, Anesthesia Post-op Instructions and Care and Recovery After Surgery       Lithotripsy  Lithotripsy is a treatment that can help break up kidney stones that are too large to pass on their own. This is a nonsurgical procedure that crushes a kidney stone with shock waves. These shock waves pass through your body and focus on the kidney stone. They cause the kidney stone to break up into smaller pieces while it is still in the urinary tract. The smaller pieces of stone can pass more easily out of your body in the urine. Tell a health care provider about:  Any allergies you have.  All medicines you are taking, including vitamins, herbs, eye drops, creams, and over-the-counter medicines.  Any problems you or family  members have had with anesthetic medicines.  Any blood disorders you have.  Any surgeries you have had.  Any medical conditions you have.  Whether you are pregnant or may be pregnant. What are the risks? Generally, this is a safe procedure. However, problems may occur, including:  Infection.  Bleeding from the kidney.  Bruising of the kidney or skin.  Scarring of the kidney, which can lead to: ? Increased blood pressure. ? Poor kidney function. ? Return (recurrence) of kidney stones.  Damage to other structures or organs, such as the liver, colon, spleen, or pancreas.  Blockage (obstruction) of the tube that carries urine from the kidney to the bladder (ureter).  Failure of the kidney stone to break into pieces (fragments). What happens before the procedure? Staying hydrated Follow instructions from your health care provider about hydration, which may include:  Up to 2 hours before the procedure - you may continue to drink clear liquids, such as water, clear fruit juice, black coffee, and plain tea. Eating and drinking restrictions Follow instructions from your health care provider about eating and drinking, which may include:  8 hours before the procedure - stop eating heavy meals or foods, such as meat, fried foods, or fatty foods.  6 hours before the procedure - stop eating light meals or foods, such as toast or cereal.  6 hours before the procedure - stop drinking milk or drinks  that contain milk.  2 hours before the procedure - stop drinking clear liquids. Medicines Ask your health care provider about:  Changing or stopping your regular medicines. This is especially important if you are taking diabetes medicines or blood thinners.  Taking medicines such as aspirin and ibuprofen. These medicines can thin your blood. Do not take these medicines unless your health care provider tells you to take them.  Taking over-the-counter medicines, vitamins, herbs, and  supplements. Tests You may have tests, such as:  Blood tests.  Urine tests.  Imaging tests, such as a CT scan. General instructions  Plan to have someone take you home from the hospital or clinic.  If you will be going home right after the procedure, plan to have someone with you for 24 hours.  Ask your health care provider what steps will be taken to help prevent infection. These may include washing skin with a germ-killing soap. What happens during the procedure?  An IV will be inserted into one of your veins.  You will be given one or more of the following: ? A medicine to help you relax (sedative). ? A medicine to make you fall asleep (general anesthetic).  A water-filled cushion may be placed behind your kidney or on your abdomen. In some cases, you may be placed in a tub of lukewarm water.  Your body will be positioned in a way that makes it easy to target the kidney stone.  An X-ray or ultrasound exam will be done to locate your stone.  Shock waves will be aimed at the stone. If you are awake, you may feel a tapping sensation as the shock waves pass through your body.  A flexible tube with holes in it (stent) may be placed in the ureter. This will help keep urine flowing from the kidney if the fragments of the stone have been blocking the ureter. The procedure may vary among health care providers and hospitals.   What happens after the procedure?  You may have an X-ray to see whether the procedure was able to break up the kidney stone and how much of the stone has passed. If large stone fragments remain after treatment, you may need to have a second procedure at a later time.  Your blood pressure, heart rate, breathing rate, and blood oxygen level will be monitored until you leave the hospital or clinic.  You may be given antibiotics or pain medicine as needed.  If a stent was placed in your ureter during surgery, it may stay in place for a few weeks.  You may need  to strain your urine to collect pieces of the kidney stone for testing.  You will need to drink plenty of water.  If you were given a sedative during the procedure, it can affect you for several hours. Do not drive or operate machinery until your health care provider says that it is safe. Summary  Lithotripsy is a treatment that can help break up kidney stones that are too large to pass on their own.  Lithotripsy is a nonsurgical procedure that crushes a kidney stone with shock waves.  Generally, this is a safe procedure. However, problems may occur, including damage to the kidney or other organs, infection, or obstruction of the tube that carries urine from the kidney to the bladder (ureter).  You may have a stent placed in your ureter to help drain your urine. This stent may stay in place for a few weeks.  After the procedure,  you will need to drink plenty of water. You may be asked to strain your urine to collect pieces of the kidney stone for testing. This information is not intended to replace advice given to you by your health care provider. Make sure you discuss any questions you have with your health care provider. Document Revised: 12/21/2018 Document Reviewed: 12/21/2018 Elsevier Patient Education  2021 [place] After This sheet gives you information about how to care for yourself after your procedure. Your health care provider may also give you more specific instructions. If you have problems or questions, contact your health care provider. What can I expect after the procedure? After the procedure, it is common to have:  Some blood in your urine. This should only last for a few days.  Soreness in your back, sides, or upper abdomen for a few days.  Blotches or bruises on the area where the shock wave entered the skin.  Pain, discomfort, or nausea when pieces (fragments) of the kidney stone move through the tube that carries urine from the kidney to the  bladder (ureter). Stone fragments may pass soon after the procedure, but they may continue to pass for up to 4-8 weeks. ? If you have severe pain or nausea, contact your health care provider. This may be caused by a large stone that was not broken up, and this may mean that you need more treatment.  Some pain or discomfort during urination.  Some pain or discomfort in the lower abdomen or (in men) at the base of the penis. Follow these instructions at home: Medicines  Take over-the-counter and prescription medicines only as told by your health care provider.  If you were prescribed an antibiotic medicine, take it as told by your health care provider. Do not stop taking the antibiotic even if you start to feel better.  Ask your health care provider if the medicine prescribed to you requires you to avoid driving or using machinery. Eating and drinking  Drink enough fluid to keep your urine pale yellow. This helps any remaining pieces of the stone to pass. It can also help prevent new stones from forming.  Eat plenty of fresh fruits and vegetables.  Follow instructions from your health care provider about eating or drinking restrictions. You may be instructed to: ? Reduce how much salt (sodium) you eat or drink. Check ingredients and nutrition facts on packaged foods and beverages to see how much sodium they contain. ? Reduce how much meat you eat.  Eat the recommended amount of calcium for your age and gender. Ask your health care provider how much calcium you should have.      General instructions  Get plenty of rest.  Return to your normal activities as told by your health care provider. Ask your health care provider what activities are safe for you. Most people can resume normal activities 1-2 days after the procedure.  If you were given a sedative during the procedure, it can affect you for several hours. Do not drive or operate machinery until your health care provider says that  it is safe.  Your health care provider may direct you to lie in a certain position (postural drainage) and tap firmly (percuss) over your kidney area to help stone fragments pass. Follow instructions as told by your health care provider.  If directed, strain all urine through the strainer that was provided by your health care provider. ? Keep all fragments for your health care provider to see.  Any stones that are found may be sent to a medical lab for examination. The stone may be as small as a grain of salt.  Keep all follow-up visits as told by your health care provider. This is important. Contact a health care provider if:  You have a fever or chills.  You have nausea that is severe or does not go away.  You have any of these urinary symptoms: ? Blood in your urine for longer than your health care provider told you to expect. ? Urine that smells bad or unusual. ? Feeling a strong urge to urinate after emptying your bladder. ? Pain or burning with urination that does not go away. ? Urinating more often than usual and this does not go away.  You have a stent and it comes out. Get help right away if:  You have severe pain in your back, sides, or upper abdomen.  You have any of these urinary symptoms: ? Severe pain while urinating. ? More blood in your urine or having blood in your urine when you did not before. ? Passing blood clots in your urine. ? Passing only a small amount of urine or being unable to pass any urine at all.  You have severe nausea that leads to persistent vomiting.  You faint. Summary  After this procedure, it is common to have some pain, discomfort, or nausea when pieces (fragments) of the kidney stone move through the tube that carries urine from the kidney to the bladder (ureter). If this pain or nausea is severe, however, you should contact your health care provider.  Return to your normal activities as told by your health care provider. Ask your health  care provider what activities are safe for you.  Drink enough fluid to keep your urine pale yellow. This helps any remaining pieces of the stone to pass, and it can help prevent new stones from forming.  If directed, strain your urine and keep all fragments for your health care provider to see. Fragments or stones may be as small as a grain of salt.  Get help right away if you have severe pain in your back, sides, or upper abdomen, or if you have severe pain while urinating. This information is not intended to replace advice given to you by your health care provider. Make sure you discuss any questions you have with your health care provider. Document Revised: 12/21/2018 Document Reviewed: 12/21/2018 Elsevier Patient Education  [place].

## 2020-05-20 NOTE — Progress Notes (Signed)
05/20/2020 10:53 AM   William Farley 09-15-1960 [id number]  Referring provider: Garey Ham, [place] [address]  nephrolithiasis  HPI: William Farley is a 60yo here for evaluation of nephrolithiasis. He has had over 100 stone events starting at age 30. He has had ESWL, ureteroscopy and PCNL. [place] last [place] was at [place]. In Dec 2021 he was diagnosed with a 4-8mm right mid pole calculation. He has intermittent sharp, mild right flank pain that is nonradiating. No other associated symptoms.    PMH: Past Medical History:  Diagnosis Date  . Anxiety   . Depression   . GERD (gastroesophageal reflux disease)   . Kidney stone   . Sleep apnea     Surgical History: Past Surgical History:  Procedure Laterality Date  . kidney stone removal    . NECK SURGERY    . REPLACEMENT TOTAL KNEE Right     Home Medications:  Allergies as of 05/20/2020      Reactions   Codeine Rash   Other reaction(s): rash/itching      Medication List       Accurate as of May 20, 2020 10:53 AM. If you have any questions, ask your nurse or doctor.        clonazePAM 0.5 MG tablet Commonly known as: KLONOPIN Take 0.5 mg by mouth 2 (two) times daily as needed for anxiety.   multivitamin capsule Take 1 capsule by mouth daily.   NARCAN NA Place into the nose.   omeprazole 20 MG capsule Commonly known as: PRILOSEC Take 20 mg by mouth daily.   PARoxetine 20 MG tablet Commonly known as: PAXIL Take 20 mg by mouth daily.   prazosin 1 MG capsule Commonly known as: MINIPRESS Take 1 mg by mouth at bedtime.   QUEtiapine 300 MG tablet Commonly known as: SEROQUEL Take 300 mg by mouth at bedtime.   zolpidem 10 MG tablet Commonly known as: AMBIEN Take 10 mg by mouth at bedtime as needed for sleep.       Allergies:  Allergies  Allergen Reactions  . Codeine Rash    Other reaction(s): rash/itching    Family History: Family History  Problem Relation Age of  Onset  . Kidney failure Father     Social History:  reports that he quit smoking about 24 years ago. His smoking use included cigarettes. He has a 5.00 pack-year smoking history. He has never used smokeless tobacco. He reports that he does not drink alcohol and does not use drugs.  ROS: All other review of systems were reviewed and are negative except what is noted above in HPI  Physical Exam: There were no vitals taken for this visit.  Constitutional:  Alert and oriented, No acute distress. HEENT: [place] AT, moist mucus membranes.  Trachea midline, no masses. Cardiovascular: No clubbing, cyanosis, or edema. Respiratory: Normal respiratory effort, no increased work of breathing. GI: Abdomen is soft, nontender, nondistended, no abdominal masses GU: No CVA tenderness.  Lymph: No cervical or inguinal lymphadenopathy. Skin: No rashes, bruises or suspicious lesions. Neurologic: Grossly intact, no focal deficits, moving all 4 extremities. Psychiatric: Normal mood and affect.  Laboratory Data: No results found for: WBC, HGB, HCT, MCV, PLT  No results found for: CREATININE  No results found for: PSA  No results found for: TESTOSTERONE  No results found for: HGBA1C  Urinalysis    Component Value Date/Time   APPEARANCEUR Cloudy (A) 05/20/2020 1025   GLUCOSEU Negative 05/20/2020 1025  BILIRUBINUR Negative 05/20/2020 1025   PROTEINUR Negative 05/20/2020 1025   NITRITE Negative 05/20/2020 1025   LEUKOCYTESUR Negative 05/20/2020 1025    Lab Results  Component Value Date   LABMICR Comment 05/20/2020    Pertinent Imaging: CT 02/29/2020: Images reviewed and discussed with patient  No results found for this or any previous visit.  No results found for this or any previous visit.  No results found for this or any previous visit.  No results found for this or any previous visit.  No results found for this or any previous visit.  No results found for this or any previous  visit.  No results found for this or any previous visit.  No results found for this or any previous visit.   Assessment & Plan:    1. Kidney stones -We discussed the management of kidney stones. These options include observation, ureteroscopy, shockwave lithotripsy (ESWL) and percutaneous nephrolithotomy (PCNL). We discussed which options are relevant to the patient's stone(s). We discussed the natural history of kidney stones as well as the complications of untreated stones and the impact on quality of life without treatment as well as with each of the above listed treatments. We also discussed the efficacy of each treatment in its ability to clear the stone burden. With any of these management options I discussed the signs and symptoms of infection and the need for emergent treatment should these be experienced. For each option we discussed the ability of each procedure to clear the patient of their stone burden.   For observation I described the risks which include but are not limited to silent renal damage, life-threatening infection, need for emergent surgery, failure to pass stone and pain.   For ureteroscopy I described the risks which include bleeding, infection, damage to contiguous structures, positioning injury, ureteral stricture, ureteral avulsion, ureteral injury, need for prolonged ureteral stent, inability to perform ureteroscopy, need for an interval procedure, inability to clear stone burden, stent discomfort/pain, heart attack, stroke, pulmonary embolus and the inherent risks with general anesthesia.   For shockwave lithotripsy I described the risks which include arrhythmia, kidney contusion, kidney hemorrhage, need for transfusion, pain, inability to adequately break up stone, inability to pass stone fragments, Stein[place], infection associated with obstructing stones, need for alternate surgical procedure, need for repeat shockwave lithotripsy, MI, CVA, PE and the inherent risks  with anesthesia/conscious sedation.   For PCNL I described the risks including positioning injury, pneumothorax, hydrothorax, need for chest tube, inability to clear stone burden, renal laceration, arterial venous fistula or malformation, need for embolization of kidney, loss of kidney or renal function, need for repeat procedure, need for prolonged nephrostomy tube, ureteral avulsion, MI, CVA, PE and the inherent risks of general anesthesia.   - The patient would like to proceed with Right ESWL  - Urinalysis, Routine w reflex microscopic   No follow-ups on file.  [name], MD  [facility] [place]

## 2020-05-20 NOTE — H&P (View-Only) (Signed)
05/20/2020 10:53 AM   Caren Griffins 11/21/1960 [id number]  Referring provider: Garey Ham, [place] [address]  nephrolithiasis  HPI: Mr Alen is a 60yo here for evaluation of nephrolithiasis. He has had over 100 stone events starting at age 35. He has had ESWL, ureteroscopy and PCNL. His last surgery was at the [place] [facility] [place]. In Dec 2021 he was diagnosed with a 4-41mm right mid pole calculation. He has intermittent sharp, mild right flank pain that is nonradiating. No other associated symptoms.    PMH: Past Medical History:  Diagnosis Date  . Anxiety   . Depression   . GERD (gastroesophageal reflux disease)   . Kidney stone   . Sleep apnea     Surgical History: Past Surgical History:  Procedure Laterality Date  . kidney stone removal    . NECK SURGERY    . REPLACEMENT TOTAL KNEE Right     Home Medications:  Allergies as of 05/20/2020      Reactions   Codeine Rash   Other reaction(s): rash/itching      Medication List       Accurate as of May 20, 2020 10:53 AM. If you have any questions, ask your nurse or doctor.        clonazePAM 0.5 MG tablet Commonly known as: KLONOPIN Take 0.5 mg by mouth 2 (two) times daily as needed for anxiety.   multivitamin capsule Take 1 capsule by mouth daily.   NARCAN NA Place into the nose.   omeprazole 20 MG capsule Commonly known as: PRILOSEC Take 20 mg by mouth daily.   PARoxetine 20 MG tablet Commonly known as: PAXIL Take 20 mg by mouth daily.   prazosin 1 MG capsule Commonly known as: MINIPRESS Take 1 mg by mouth at bedtime.   QUEtiapine 300 MG tablet Commonly known as: SEROQUEL Take 300 mg by mouth at bedtime.   zolpidem 10 MG tablet Commonly known as: AMBIEN Take 10 mg by mouth at bedtime as needed for sleep.       Allergies:  Allergies  Allergen Reactions  . Codeine Rash    Other reaction(s): rash/itching    Family History: Family History  Problem Relation Age of  Onset  . Kidney failure Father     Social History:  reports that he quit smoking about 24 years ago. His smoking use included cigarettes. He has a 5.00 pack-year smoking history. He has never used smokeless tobacco. He reports that he does not drink alcohol and does not use drugs.  ROS: All other review of systems were reviewed and are negative except what is noted above in HPI  Physical Exam: There were no vitals taken for this visit.  Constitutional:  Alert and oriented, No acute distress. HEENT: [place] AT, moist mucus membranes.  Trachea midline, no masses. Cardiovascular: No clubbing, cyanosis, or edema. Respiratory: Normal respiratory effort, no increased work of breathing. GI: Abdomen is soft, nontender, nondistended, no abdominal masses GU: No CVA tenderness.  Lymph: No cervical or inguinal lymphadenopathy. Skin: No rashes, bruises or suspicious lesions. Neurologic: Grossly intact, no focal deficits, moving all 4 extremities. Psychiatric: Normal mood and affect.  Laboratory Data: No results found for: WBC, HGB, HCT, MCV, PLT  No results found for: CREATININE  No results found for: PSA  No results found for: TESTOSTERONE  No results found for: HGBA1C  Urinalysis    Component Value Date/Time   APPEARANCEUR Cloudy (A) 05/20/2020 1025   GLUCOSEU Negative 05/20/2020 1025  BILIRUBINUR Negative 05/20/2020 1025   PROTEINUR Negative 05/20/2020 1025   NITRITE Negative 05/20/2020 1025   LEUKOCYTESUR Negative 05/20/2020 1025    Lab Results  Component Value Date   LABMICR Comment 05/20/2020    Pertinent Imaging: CT 02/29/2020: Images reviewed and discussed with patient  No results found for this or any previous visit.  No results found for this or any previous visit.  No results found for this or any previous visit.  No results found for this or any previous visit.  No results found for this or any previous visit.  No results found for this or any previous  visit.  No results found for this or any previous visit.  No results found for this or any previous visit.   Assessment & Plan:    1. Kidney stones -We discussed the management of kidney stones. These options include observation, ureteroscopy, shockwave lithotripsy (ESWL) and percutaneous nephrolithotomy (PCNL). We discussed which options are relevant to the patient's stone(s). We discussed the natural history of kidney stones as well as the complications of untreated stones and the impact on quality of life without treatment as well as with each of the above listed treatments. We also discussed the efficacy of each treatment in its ability to clear the stone burden. With any of these management options I discussed the signs and symptoms of infection and the need for emergent treatment should these be experienced. For each option we discussed the ability of each procedure to clear the patient of their stone burden.   For observation I described the risks which include but are not limited to silent renal damage, life-threatening infection, need for emergent surgery, failure to pass stone and pain.   For ureteroscopy I described the risks which include bleeding, infection, damage to contiguous structures, positioning injury, ureteral stricture, ureteral avulsion, ureteral injury, need for prolonged ureteral stent, inability to perform ureteroscopy, need for an interval procedure, inability to clear stone burden, stent discomfort/pain, heart attack, stroke, pulmonary embolus and the inherent risks with general anesthesia.   For shockwave lithotripsy I described the risks which include arrhythmia, kidney contusion, kidney hemorrhage, need for transfusion, pain, inability to adequately break up stone, inability to pass stone fragments, Steinstrasse, infection associated with obstructing stones, need for alternate surgical procedure, need for repeat shockwave lithotripsy, MI, CVA, PE and the inherent risks  with anesthesia/conscious sedation.   For PCNL I described the risks including positioning injury, pneumothorax, hydrothorax, need for chest tube, inability to clear stone burden, renal laceration, arterial venous fistula or malformation, need for embolization of kidney, loss of kidney or renal function, need for repeat procedure, need for prolonged nephrostomy tube, ureteral avulsion, MI, CVA, PE and the inherent risks of general anesthesia.   - The patient would like to proceed with Right ESWL  - Urinalysis, Routine w reflex microscopic   No follow-ups on file.  [name], MD  [facility] [place]

## 2020-05-20 NOTE — Patient Instructions (Signed)
[employer] (25th ed., pp. 811-816). [place], [place]: [employer], Else[employer]. Retrieved from https://www.clinicalkey.com/#!/content/book/[email or web address]?[email or web address]">  Lithotripsy  Lithotripsy is a treatment that can help break up kidney stones that are too large to pass on their own. This is a nonsurgical procedure that crushes a kidney stone with shock waves. These shock waves pass through your body and focus on the kidney stone. They cause the kidney stone to break up into smaller pieces while it is still in the urinary tract. The smaller pieces of stone can pass more easily out of your body in the urine. Tell a health care provider about:  Any allergies you have.  All medicines you are taking, including vitamins, herbs, eye drops, creams, and over-the-counter medicines.  Any problems you or family members have had with anesthetic medicines.  Any blood disorders you have.  Any surgeries you have had.  Any medical conditions you have.  Whether you are pregnant or may be pregnant. What are the risks? Generally, this is a safe procedure. However, problems may occur, including:  Infection.  Bleeding from the kidney.  Bruising of the kidney or skin.  Scarring of the kidney, which can lead to: ? Increased blood pressure. ? Poor kidney function. ? Return (recurrence) of kidney stones.  Damage to other structures or organs, such as the liver, colon, spleen, or pancreas.  Blockage (obstruction) of the tube that carries urine from the kidney to the bladder (ureter).  Failure of the kidney stone to break into pieces (fragments). What happens before the procedure? Staying hydrated Follow instructions from your health care provider about hydration, which may include:  Up to 2 hours before the procedure - you may continue to drink clear liquids, such as water, clear fruit juice, black coffee, and plain tea. Eating and drinking restrictions Follow  instructions from your health care provider about eating and drinking, which may include:  8 hours before the procedure - stop eating heavy meals or foods, such as meat, fried foods, or fatty foods.  6 hours before the procedure - stop eating light meals or foods, such as toast or cereal.  6 hours before the procedure - stop drinking milk or drinks that contain milk.  2 hours before the procedure - stop drinking clear liquids. Medicines Ask your health care provider about:  Changing or stopping your regular medicines. This is especially important if you are taking diabetes medicines or blood thinners.  Taking medicines such as aspirin and ibuprofen. These medicines can thin your blood. Do not take these medicines unless your health care provider tells you to take them.  Taking over-the-counter medicines, vitamins, herbs, and supplements. Tests You may have tests, such as:  Blood tests.  Urine tests.  Imaging tests, such as a CT scan. General instructions  Plan to have someone take you home from the hospital or clinic.  If you will be going home right after the procedure, plan to have someone with you for 24 hours.  Ask your health care provider what steps will be taken to help prevent infection. These may include washing skin with a germ-killing soap. What happens during the procedure?  An IV will be inserted into one of your veins.  You will be given one or more of the following: ? A medicine to help you relax (sedative). ? A medicine to make you fall asleep (general anesthetic).  A water-filled cushion may be placed behind your kidney or on your abdomen. In some cases, you may be placed in a tub of   lukewarm water.  Your body will be positioned in a way that makes it easy to target the kidney stone.  An X-ray or ultrasound exam will be done to locate your stone.  Shock waves will be aimed at the stone. If you are awake, you may feel a tapping sensation as the shock  waves pass through your body.  A flexible tube with holes in it (stent) may be placed in the ureter. This will help keep urine flowing from the kidney if the fragments of the stone have been blocking the ureter. The procedure may vary among health care providers and hospitals.   What happens after the procedure?  You may have an X-ray to see whether the procedure was able to break up the kidney stone and how much of the stone has passed. If large stone fragments remain after treatment, you may need to have a second procedure at a later time.  Your blood pressure, heart rate, breathing rate, and blood oxygen level will be monitored until you leave the hospital or clinic.  You may be given antibiotics or pain medicine as needed.  If a stent was placed in your ureter during surgery, it may stay in place for a few weeks.  You may need to strain your urine to collect pieces of the kidney stone for testing.  You will need to drink plenty of water.  If you were given a sedative during the procedure, it can affect you for several hours. Do not drive or operate machinery until your health care provider says that it is safe. Summary  Lithotripsy is a treatment that can help break up kidney stones that are too large to pass on their own.  Lithotripsy is a nonsurgical procedure that crushes a kidney stone with shock waves.  Generally, this is a safe procedure. However, problems may occur, including damage to the kidney or other organs, infection, or obstruction of the tube that carries urine from the kidney to the bladder (ureter).  You may have a stent placed in your ureter to help drain your urine. This stent may stay in place for a few weeks.  After the procedure, you will need to drink plenty of water. You may be asked to strain your urine to collect pieces of the kidney stone for testing. This information is not intended to replace advice given to you by your health care provider. Make sure  you discuss any questions you have with your health care provider. Document Revised: 12/21/2018 Document Reviewed: 12/21/2018 Elsevier Patient Education  2021 Elsevier Inc.  

## 2020-05-20 NOTE — Progress Notes (Signed)
Urological Symptom Review  Patient is experiencing the following symptoms: Get up at night to urinate Weak stream Erection problems (male only)   Review of Systems  Gastrointestinal (upper)  : Indigestion/heartburn  Gastrointestinal (lower) : Diarrhea  Constitutional : Night Sweats Fatigue  Skin: Negative for skin symptoms  Eyes: Negative for eye symptoms  Ear/Nose/Throat : Negative for Ear/Nose/Throat symptoms  Hematologic/Lymphatic: Negative for Hematologic/Lymphatic symptoms  Cardiovascular : Negative for cardiovascular symptoms  Respiratory : Negative for respiratory symptoms  Endocrine: Negative for endocrine symptoms  Musculoskeletal: Joint pain  Neurological: Headaches Dizziness  Psychologic: Depression Anxiety

## 2020-05-21 ENCOUNTER — Encounter (HOSPITAL_COMMUNITY)
Admission: RE | Disposition: A | Discharge status: Home / Self Care | Payer: Self-pay | Source: Ambulatory Visit | Attending: Urology

## 2020-05-21 ENCOUNTER — Ambulatory Visit (HOSPITAL_COMMUNITY)
Admission: RE | Admit: 2020-05-21 | Discharge: 2020-05-21 | Disposition: A | Discharge status: Home / Self Care | Payer: No Typology Code available for payment source | Source: Ambulatory Visit | Attending: Urology | Admitting: Urology

## 2020-05-21 ENCOUNTER — Encounter (HOSPITAL_COMMUNITY): Payer: Self-pay | Admitting: Urology

## 2020-05-21 ENCOUNTER — Other Ambulatory Visit: Payer: Self-pay

## 2020-05-21 DIAGNOSIS — Z79899 Other long term (current) drug therapy: Secondary | ICD-10-CM | POA: Insufficient documentation

## 2020-05-21 DIAGNOSIS — Z885 Allergy status to narcotic agent status: Secondary | ICD-10-CM | POA: Diagnosis not present

## 2020-05-21 DIAGNOSIS — Z96651 Presence of right artificial knee joint: Secondary | ICD-10-CM | POA: Insufficient documentation

## 2020-05-21 DIAGNOSIS — N200 Calculus of kidney: Secondary | ICD-10-CM | POA: Diagnosis not present

## 2020-05-21 HISTORY — PX: EXTRACORPOREAL SHOCK WAVE LITHOTRIPSY: SHX1557

## 2020-05-21 SURGERY — LITHOTRIPSY, ESWL
Anesthesia: LOCAL | Laterality: Right

## 2020-05-21 MED ORDER — ONDANSETRON HCL 4 MG PO TABS: 4.0000 mg | ORAL_TABLET | Freq: Every day | ORAL | 1 refills | Status: DC | PRN

## 2020-05-21 MED ORDER — DIPHENHYDRAMINE HCL 25 MG PO CAPS
ORAL_CAPSULE | ORAL | Status: AC
  Administered 2020-05-21: 25 mg via ORAL
  Filled 2020-05-21: qty 1

## 2020-05-21 MED ORDER — OXYCODONE-ACETAMINOPHEN 5-325 MG PO TABS: 1.0000 | ORAL_TABLET | ORAL | 0 refills | Status: DC | PRN

## 2020-05-21 MED ORDER — TAMSULOSIN HCL 0.4 MG PO CAPS: 0.4000 mg | ORAL_CAPSULE | Freq: Every day | ORAL | 0 refills | Status: DC

## 2020-05-21 MED ORDER — DIPHENHYDRAMINE HCL 25 MG PO CAPS: 25.0000 mg | ORAL_CAPSULE | ORAL | Status: AC

## 2020-05-21 MED ORDER — SODIUM CHLORIDE 0.9 % IV SOLN: Freq: Once | INTRAVENOUS | Status: DC

## 2020-05-21 MED ORDER — DIAZEPAM 5 MG PO TABS
ORAL_TABLET | ORAL | Status: AC
  Administered 2020-05-21: 10 mg via ORAL
  Filled 2020-05-21: qty 2

## 2020-05-21 MED ORDER — DIAZEPAM 5 MG PO TABS: 10.0000 mg | ORAL_TABLET | Freq: Once | ORAL | Status: AC

## 2020-05-21 NOTE — Interval H&P Note (Signed)
History and Physical Interval Note:  05/21/2020 11:09 AM  William Farley  has presented today for surgery, with the diagnosis of right renal calculus.  The various methods of treatment have been discussed with the patient and family. After consideration of risks, benefits and other options for treatment, the patient has consented to  Procedure(s): EXTRACORPOREAL SHOCK WAVE LITHOTRIPSY (ESWL) (Right) as a surgical intervention.  The patient's history has been reviewed, patient examined, no change in status, stable for surgery.  I have reviewed the patient's chart and labs.  Questions were answered to the patient's satisfaction.     [name]

## 2020-05-21 NOTE — Discharge Instructions (Signed)
Lithotripsy, Care After This sheet gives you information about how to care for yourself after your procedure. Your health care provider may also give you more specific instructions. If you have problems or questions, contact your health care provider. What can I expect after the procedure? After the procedure, it is common to have:  Some blood in your urine. This should only last for a few days.  Soreness in your back, sides, or upper abdomen for a few days.  Blotches or bruises on the area where the shock wave entered the skin.  Pain, discomfort, or nausea when pieces (fragments) of the kidney stone move through the tube that carries urine from the kidney to the bladder (ureter). Stone fragments may pass soon after the procedure, but they may continue to pass for up to 4-8 weeks. ? If you have severe pain or nausea, contact your health care provider. This may be caused by a large stone that was not broken up, and this may mean that you need more treatment.  Some pain or discomfort during urination.  Some pain or discomfort in the lower abdomen or (in men) at the base of the penis. Follow these instructions at home: Medicines  Take over-the-counter and prescription medicines only as told by your health care provider.  If you were prescribed an antibiotic medicine, take it as told by your health care provider. Do not stop taking the antibiotic even if you start to feel better.  Ask your health care provider if the medicine prescribed to you requires you to avoid driving or using machinery. Eating and drinking  Drink enough fluid to keep your urine pale yellow. This helps any remaining pieces of the stone to pass. It can also help prevent new stones from forming.  Eat plenty of fresh fruits and vegetables.  Follow instructions from your health care provider about eating or drinking restrictions. You may be instructed to: ? Reduce how much salt (sodium) you eat or drink. Check  ingredients and nutrition facts on packaged foods and beverages to see how much sodium they contain. ? Reduce how much meat you eat.  Eat the recommended amount of calcium for your age and gender. Ask your health care provider how much calcium you should have.      General instructions  Get plenty of rest.  Return to your normal activities as told by your health care provider. Ask your health care provider what activities are safe for you. Most people can resume normal activities 1-2 days after the procedure.  If you were given a sedative during the procedure, it can affect you for several hours. Do not drive or operate machinery until your health care provider says that it is safe.  Your health care provider may direct you to lie in a certain position (postural drainage) and tap firmly (percuss) over your kidney area to help stone fragments pass. Follow instructions as told by your health care provider.  If directed, strain all urine through the strainer that was provided by your health care provider. ? Keep all fragments for your health care provider to see. Any stones that are found may be sent to a medical lab for examination. The stone may be as small as a grain of salt.  Keep all follow-up visits as told by your health care provider. This is important. Contact a health care provider if:  You have a fever or chills.  You have nausea that is severe or does not go away.  You have   any of these urinary symptoms: ? Blood in your urine for longer than your health care provider told you to expect. ? Urine that smells bad or unusual. ? Feeling a strong urge to urinate after emptying your bladder. ? Pain or burning with urination that does not go away. ? Urinating more often than usual and this does not go away.  You have a stent and it comes out. Get help right away if:  You have severe pain in your back, sides, or upper abdomen.  You have any of these urinary symptoms: ? Severe  pain while urinating. ? More blood in your urine or having blood in your urine when you did not before. ? Passing blood clots in your urine. ? Passing only a small amount of urine or being unable to pass any urine at all.  You have severe nausea that leads to persistent vomiting.  You faint. Summary  After this procedure, it is common to have some pain, discomfort, or nausea when pieces (fragments) of the kidney stone move through the tube that carries urine from the kidney to the bladder (ureter). If this pain or nausea is severe, however, you should contact your health care provider.  Return to your normal activities as told by your health care provider. Ask your health care provider what activities are safe for you.  Drink enough fluid to keep your urine pale yellow. This helps any remaining pieces of the stone to pass, and it can help prevent new stones from forming.  If directed, strain your urine and keep all fragments for your health care provider to see. Fragments or stones may be as small as a grain of salt.  Get help right away if you have severe pain in your back, sides, or upper abdomen, or if you have severe pain while urinating. This information is not intended to replace advice given to you by your health care provider. Make sure you discuss any questions you have with your health care provider. Document Revised: 12/21/2018 Document Reviewed: 12/21/2018 Elsevier Patient Education  2021 Elsevier Inc.   Monitored Anesthesia Care, Care After This sheet gives you information about how to care for yourself after your procedure. Your health care provider may also give you more specific instructions. If you have problems or questions, contact your health care provider. What can I expect after the procedure? After the procedure, it is common to have:  Tiredness.  Forgetfulness about what happened after the procedure.  Impaired judgment for important decisions.  Nausea or  vomiting.  Some difficulty with balance. Follow these instructions at home: For the time period you were told by your health care provider:  Rest as needed.  Do not participate in activities where you could fall or become injured.  Do not drive or use machinery.  Do not drink alcohol.  Do not take sleeping pills or medicines that cause drowsiness.  Do not make important decisions or sign legal documents.  Do not take care of children on your own.      Eating and drinking  Follow the diet that is recommended by your health care provider.  Drink enough fluid to keep your urine pale yellow.  If you vomit: ? Drink water, juice, or soup when you can drink without vomiting. ? Make sure you have little or no nausea before eating solid foods. General instructions  Have a responsible adult stay with you for the time you are told. It is important to have someone help care   for you until you are awake and alert.  Take over-the-counter and prescription medicines only as told by your health care provider.  If you have sleep apnea, surgery and certain medicines can increase your risk for breathing problems. Follow instructions from your health care provider about wearing your sleep device: ? Anytime you are sleeping, including during daytime naps. ? While taking prescription pain medicines, sleeping medicines, or medicines that make you drowsy.  Avoid smoking.  Keep all follow-up visits as told by your health care provider. This is important. Contact a health care provider if:  You keep feeling nauseous or you keep vomiting.  You feel light-headed.  You are still sleepy or having trouble with balance after 24 hours.  You develop a rash.  You have a fever.  You have redness or swelling around the IV site. Get help right away if:  You have trouble breathing.  You have new-onset confusion at home. Summary  For several hours after your procedure, you may feel tired. You  may also be forgetful and have poor judgment.  Have a responsible adult stay with you for the time you are told. It is important to have someone help care for you until you are awake and alert.  Rest as told. Do not drive or operate machinery. Do not drink alcohol or take sleeping pills.  Get help right away if you have trouble breathing, or if you suddenly become confused. This information is not intended to replace advice given to you by your health care provider. Make sure you discuss any questions you have with your health care provider. Document Revised: 11/23/2019 Document Reviewed: 02/09/2019 Elsevier Patient Education  2021 Elsevier Inc.  

## 2020-05-21 NOTE — Progress Notes (Signed)
Pt was able to urinate. Urine was light amber -colored. RN noted light red bruising to right side area. No complaints of pain.

## 2020-05-24 ENCOUNTER — Encounter (HOSPITAL_COMMUNITY): Payer: Self-pay | Admitting: Urology

## 2020-06-05 ENCOUNTER — Ambulatory Visit (HOSPITAL_COMMUNITY)
Admission: RE | Admit: 2020-06-05 | Discharge: 2020-06-05 | Disposition: A | Discharge status: Home / Self Care | Payer: No Typology Code available for payment source | Source: Ambulatory Visit | Attending: Urology | Admitting: Urology

## 2020-06-05 ENCOUNTER — Encounter: Payer: Self-pay | Admitting: Urology

## 2020-06-05 ENCOUNTER — Ambulatory Visit (INDEPENDENT_AMBULATORY_CARE_PROVIDER_SITE_OTHER): Payer: No Typology Code available for payment source | Admitting: Urology

## 2020-06-05 ENCOUNTER — Other Ambulatory Visit: Payer: Self-pay

## 2020-06-05 VITALS — BP 117/72 | HR 76 | Temp 97.8°F | Ht 71.0 in | Wt 250.0 lb

## 2020-06-05 DIAGNOSIS — N200 Calculus of kidney: Secondary | ICD-10-CM

## 2020-06-05 LAB — URINALYSIS, ROUTINE W REFLEX MICROSCOPIC
Bilirubin, UA: NEGATIVE
Glucose, UA: NEGATIVE
Ketones, UA: NEGATIVE
Leukocytes,UA: NEGATIVE
Nitrite, UA: NEGATIVE
Protein,UA: NEGATIVE
RBC, UA: NEGATIVE
Specific Gravity, UA: 1.02 (ref 1.005–1.030)
Urobilinogen, Ur: 0.2 mg/dL (ref 0.2–1.0)
pH, UA: 5.5 (ref 5.0–7.5)

## 2020-06-05 NOTE — Progress Notes (Signed)
Urological Symptom Review  Patient is experiencing the following symptoms: Get up at night to urinate Blood in urine Weak stream Erection problems (male only)  Kidney stones  Review of Systems  Gastrointestinal (upper)  : Negative for upper GI symptoms  Gastrointestinal (lower) : Diarrhea  Constitutional : Night Sweats Fatigue  Skin: Negative for skin symptoms  Eyes: Negative for eye symptoms  Ear/Nose/Throat : Negative for Ear/Nose/Throat symptoms  Hematologic/Lymphatic: Negative for Hematologic/Lymphatic symptoms  Cardiovascular : Negative for cardiovascular symptoms  Respiratory : Negative for respiratory symptoms  Endocrine: Negative for endocrine symptoms  Musculoskeletal: Joint pain  Neurological: Headaches  Psychologic: Negative for psychiatric symptoms

## 2020-06-05 NOTE — Progress Notes (Signed)
06/05/2020 3:02 PM   Caren Griffins 10-Apr-1960 [id number]  Referring provider: Garey Ham, [place] [address]  Followup ESWL  HPI: [name] is a 60yo here for followup after ESWL. He has passed a couple of small fragments since ESWL. No flank pain currently. No LUTS   PMH: Past Medical History:  Diagnosis Date  . Anxiety   . Arthritis   . Depression   . GERD (gastroesophageal reflux disease)   . Kidney stone   . Sleep apnea    wears CPAP     Surgical History: Past Surgical History:  Procedure Laterality Date  . APPENDECTOMY    . CHOLECYSTECTOMY    . CYST EXCISION    . CYSTOSCOPY    . EXTRACORPOREAL SHOCK WAVE LITHOTRIPSY Right 05/21/2020   Procedure: EXTRACORPOREAL SHOCK WAVE LITHOTRIPSY (ESWL);  Surgeon: Cleon Gustin, MD;  Location: [facility];  Service: Urology;  Laterality: Right;  . GASTRIC BYPASS OPEN  2003  . INCISIONAL HERNIA REPAIR    . kidney stone removal    . KNEE ARTHROSCOPY Left    multiple  . NECK SURGERY    . REPLACEMENT TOTAL KNEE Right   . RHINOPLASTY      Home Medications:  Allergies as of 06/05/2020      Reactions   Codeine Rash, Itching, Other (See Comments)   Other reaction(s): rash/itching      Medication List       Accurate as of June 05, 2020  3:02 PM. If you have any questions, ask your nurse or doctor.        clonazePAM 0.5 MG tablet Commonly known as: KLONOPIN Take 0.5 mg by mouth 2 (two) times daily as needed for anxiety.   multivitamin capsule Take 1 capsule by mouth daily.   NARCAN NA Place into the nose.   omeprazole 20 MG capsule Commonly known as: PRILOSEC Take 20 mg by mouth daily.   ondansetron 4 MG tablet Commonly known as: Zofran Take 1 tablet (4 mg total) by mouth daily as needed for nausea or vomiting.   oxyCODONE-acetaminophen 5-325 MG tablet Commonly known as: Percocet Take 1 tablet by mouth every 4 (four) hours as needed for severe pain.   PARoxetine 20 MG  tablet Commonly known as: PAXIL Take 20 mg by mouth daily.   prazosin 1 MG capsule Commonly known as: MINIPRESS Take 1 mg by mouth at bedtime.   QUEtiapine 300 MG tablet Commonly known as: SEROQUEL Take 300 mg by mouth at bedtime.   tamsulosin 0.4 MG Caps capsule Commonly known as: Flomax Take 1 capsule (0.4 mg total) by mouth daily after supper.   zolpidem 10 MG tablet Commonly known as: AMBIEN Take 10 mg by mouth at bedtime as needed for sleep.       Allergies:  Allergies  Allergen Reactions  . Codeine Rash, Itching and Other (See Comments)    Other reaction(s): rash/itching    Family History: Family History  Problem Relation Age of Onset  . Kidney failure Father     Social History:  reports that he quit smoking about 24 years ago. His smoking use included cigarettes. He has a 5.00 pack-year smoking history. He has never used smokeless tobacco. He reports that he does not drink alcohol and does not use drugs.  ROS: All other review of systems were reviewed and are negative except what is noted above in HPI  Physical Exam: BP 117/72   Pulse 76   Temp 97.8  F (36.6 C)   Ht 5\' 11"  (1.803 m)   Wt 250 lb (113.4 kg)   BMI 34.87 kg/m   Constitutional:  Alert and oriented, No acute distress. HEENT: [place] AT, moist mucus membranes.  Trachea midline, no masses. Cardiovascular: No clubbing, cyanosis, or edema. Respiratory: Normal respiratory effort, no increased work of breathing. GI: Abdomen is soft, nontender, nondistended, no abdominal masses GU: No CVA tenderness.  Lymph: No cervical or inguinal lymphadenopathy. Skin: No rashes, bruises or suspicious lesions. Neurologic: Grossly intact, no focal deficits, moving all 4 extremities. Psychiatric: Normal mood and affect.  Laboratory Data: No results found for: WBC, HGB, HCT, MCV, PLT  No results found for: CREATININE  No results found for: PSA  No results found for: TESTOSTERONE  No results found for:  HGBA1C  Urinalysis    Component Value Date/Time   APPEARANCEUR Cloudy (A) 05/20/2020 1025   GLUCOSEU Negative 05/20/2020 1025   BILIRUBINUR Negative 05/20/2020 1025   PROTEINUR Negative 05/20/2020 1025   NITRITE Negative 05/20/2020 1025   LEUKOCYTESUR Negative 05/20/2020 1025    Lab Results  Component Value Date   LABMICR Comment 05/20/2020    Pertinent Imaging: KUB today: Images reviewed and discussed with the patient Results for orders placed during the hospital encounter of 05/21/20  DG Abd 1 View  Narrative CLINICAL DATA:  Pre-procedure, nephrolithiasis  EXAM: ABDOMEN - 1 VIEW  COMPARISON:  Abdominal radiographs, 05/20/2020  FINDINGS: The bowel gas pattern is normal. Bowel suture in the upper abdomen in keeping with gastric bypass. Small nonobstructive calculus projecting over the inferior pole of the right kidney is unchanged.  IMPRESSION: Small nonobstructive calculus projecting over the inferior pole of the right kidney is unchanged.   Electronically Signed By: Eddie Candle M.D. On: 05/21/2020 19:42  No results found for this or any previous visit.  No results found for this or any previous visit.  No results found for this or any previous visit.  No results found for this or any previous visit.  No results found for this or any previous visit.  No results found for this or any previous visit.  No results found for this or any previous visit.   Assessment & Plan:    1. Kidney stones -RTC 2 weeks with KUB - Urinalysis, Routine w reflex microscopic - Abdomen 1 view (KUB); Future   Return in about 2 weeks (around 06/19/2020) for KUB.  [name], MD  [facility] [place]

## 2020-06-05 NOTE — Patient Instructions (Signed)

## 2020-06-17 ENCOUNTER — Other Ambulatory Visit: Payer: Self-pay | Admitting: Urology

## 2020-06-18 ENCOUNTER — Ambulatory Visit (HOSPITAL_COMMUNITY)
Admission: RE | Admit: 2020-06-18 | Discharge: 2020-06-18 | Disposition: A | Discharge status: Home / Self Care | Payer: No Typology Code available for payment source | Source: Ambulatory Visit | Attending: Urology | Admitting: Urology

## 2020-06-18 ENCOUNTER — Other Ambulatory Visit: Payer: Self-pay

## 2020-06-18 DIAGNOSIS — N200 Calculus of kidney: Secondary | ICD-10-CM | POA: Diagnosis not present

## 2020-06-25 ENCOUNTER — Other Ambulatory Visit: Payer: Self-pay | Admitting: Urology

## 2020-06-26 ENCOUNTER — Other Ambulatory Visit: Payer: Self-pay

## 2020-06-26 ENCOUNTER — Ambulatory Visit (INDEPENDENT_AMBULATORY_CARE_PROVIDER_SITE_OTHER): Payer: No Typology Code available for payment source | Admitting: Urology

## 2020-06-26 VITALS — BP 113/68 | HR 71 | Temp 97.3°F | Ht 71.0 in | Wt 250.0 lb

## 2020-06-26 DIAGNOSIS — N200 Calculus of kidney: Secondary | ICD-10-CM | POA: Diagnosis not present

## 2020-06-26 LAB — URINALYSIS, ROUTINE W REFLEX MICROSCOPIC
Bilirubin, UA: NEGATIVE
Glucose, UA: NEGATIVE
Ketones, UA: NEGATIVE
Leukocytes,UA: NEGATIVE
Nitrite, UA: NEGATIVE
Protein,UA: NEGATIVE
Specific Gravity, UA: >1.03 — ABNORMAL HIGH (ref 1.005–1.030)
Urobilinogen, Ur: 0.2 mg/dL (ref 0.2–1.0)
pH, UA: 5.5 (ref 5.0–7.5)

## 2020-06-26 LAB — MICROSCOPIC EXAMINATION
Bacteria, UA: NONE SEEN
RBC, Urine: >30 /hpf — AB (ref 0–2)
Renal Epithel, UA: NONE SEEN /hpf
WBC, UA: NONE SEEN /hpf (ref 0–5)

## 2020-06-26 NOTE — Progress Notes (Signed)
Urological Symptom Review  Patient is experiencing the following symptoms: Get up at night to urinate Blood in urine Weak stream  Kidney stones  Review of Systems  Gastrointestinal (upper)  : Negative for upper GI symptoms  Gastrointestinal (lower) : Diarrhea  Constitutional : Night Sweats Fatigue  Skin: Negative for skin symptoms  Eyes: Negative for eye symptoms  Ear/Nose/Throat : Negative for Ear/Nose/Throat symptoms  Hematologic/Lymphatic: Negative for Hematologic/Lymphatic symptoms  Cardiovascular : Negative for cardiovascular symptoms  Respiratory : Negative for respiratory symptoms  Endocrine: Negative for endocrine symptoms  Musculoskeletal: Joint pain  Neurological: Headaches Dizziness  Psychologic: Negative for psychiatric symptoms

## 2020-06-26 NOTE — Progress Notes (Signed)
06/26/2020 1:56 PM   William Farley [birth year] [id number]  Referring provider: Garey Ham[place] [address]  nephrolithiasis  HPI: William Farley is a 60yo here for followup for nephrolithiasis. Since last visit he has not passed any fragments. He continues to have intermittent sharp mild to moderate nonradiating right flank pain. No worsening LUTS. KUB shows a 62mm rigth lower pole calculus but no definitive right ureteral calculi   PMH: Past Medical History:  Diagnosis Date  . Anxiety   . Arthritis   . Depression   . GERD (gastroesophageal reflux disease)   . Kidney stone   . Sleep apnea    wears CPAP     Surgical History: Past Surgical History:  Procedure Laterality Date  . APPENDECTOMY    . CHOLECYSTECTOMY    . CYST EXCISION    . CYSTOSCOPY    . EXTRACORPOREAL SHOCK WAVE LITHOTRIPSY Right 05/21/2020   Procedure: EXTRACORPOREAL SHOCK WAVE LITHOTRIPSY (ESWL);  Surgeon: Cleon Gustin, MD;  Location: [facility];  Service: Urology;  Laterality: Right;  . GASTRIC BYPASS OPEN  2003  . INCISIONAL HERNIA REPAIR    . kidney stone removal    . KNEE ARTHROSCOPY Left    multiple  . NECK SURGERY    . REPLACEMENT TOTAL KNEE Right   . RHINOPLASTY      Home Medications:  Allergies as of 06/26/2020      Reactions   Codeine Rash, Itching, Other (See Comments)   Other reaction(s): rash/itching      Medication List       Accurate as of June 26, 2020  1:56 PM. If you have any questions, ask your nurse or doctor.        STOP taking these medications   clonazePAM 0.5 MG tablet Commonly known as: KLONOPIN Stopped by: [name], MD   ondansetron 4 MG tablet Commonly known as: Zofran Stopped by: [name], MD   oxyCODONE-acetaminophen 5-325 MG tablet Commonly known as: Percocet Stopped by: [name], MD     TAKE these medications   LORazepam 1 MG tablet Commonly known as: ATIVAN TAKE ONE TABLET BY MOUTH 3 TIMES A DAY AS  NEEDED FOR PERSISTENT ANXIETY   multivitamin capsule Take 1 capsule by mouth daily.   NARCAN NA Place into the nose.   omeprazole 20 MG capsule Commonly known as: PRILOSEC Take 20 mg by mouth daily.   PARoxetine 20 MG tablet Commonly known as: PAXIL Take 20 mg by mouth daily.   prazosin 1 MG capsule Commonly known as: MINIPRESS Take 1 mg by mouth at bedtime.   QUEtiapine 300 MG tablet Commonly known as: SEROQUEL Take 300 mg by mouth at bedtime.   tamsulosin 0.4 MG Caps capsule Commonly known as: FLOMAX TAKE 1 CAPSULE(0.4 MG) BY MOUTH DAILY AFTER SUPPER   zolpidem 10 MG tablet Commonly known as: AMBIEN Take 10 mg by mouth at bedtime as needed for sleep.       Allergies:  Allergies  Allergen Reactions  . Codeine Rash, Itching and Other (See Comments)    Other reaction(s): rash/itching    Family History: Family History  Problem Relation Age of Onset  . Kidney failure Father     Social History:  reports that he quit smoking about 24 years ago. His smoking use included cigarettes. He has a 5.00 pack-year smoking history. He has never used smokeless tobacco. He reports that he does not drink alcohol and does not use drugs.  ROS:  All other review of systems were reviewed and are negative except what is noted above in HPI  Physical Exam: BP 113/68   Pulse 71   Temp (!) 97.3 F (36.3 C)   Ht 5\' 11"  (1.803 m)   Wt 250 lb (113.4 kg)   BMI 34.87 kg/m   Constitutional:  Alert and oriented, No acute distress. HEENT: [place] AT, moist mucus membranes.  Trachea midline, no masses. Cardiovascular: No clubbing, cyanosis, or edema. Respiratory: Normal respiratory effort, no increased work of breathing. GI: Abdomen is soft, nontender, nondistended, no abdominal masses GU: No CVA tenderness.  Lymph: No cervical or inguinal lymphadenopathy. Skin: No rashes, bruises or suspicious lesions. Neurologic: Grossly intact, no focal deficits, moving all 4 extremities. Psychiatric:  Normal mood and affect.  Laboratory Data: No results found for: WBC, HGB, HCT, MCV, PLT  No results found for: CREATININE  No results found for: PSA  No results found for: TESTOSTERONE  No results found for: HGBA1C  Urinalysis    Component Value Date/Time   APPEARANCEUR Clear 06/05/2020 1434   GLUCOSEU Negative 06/05/2020 1434   BILIRUBINUR Negative 06/05/2020 1434   PROTEINUR Negative 06/05/2020 1434   NITRITE Negative 06/05/2020 1434   LEUKOCYTESUR Negative 06/05/2020 1434    Lab Results  Component Value Date   LABMICR Comment 06/05/2020    Pertinent Imaging: KUb 06/18/2020: Images reviewed and discussed with the patient Results for orders placed during the hospital encounter of 06/18/20  Abdomen 1 view (KUB)  Narrative CLINICAL DATA:  Nephrolithiasis  EXAM: ABDOMEN - 1 VIEW  COMPARISON:  06/05/2020  FINDINGS: 4 mm calculus overlies the lower pole the right kidney, unchanged from a prior CT examination of 02/29/2020. No other definite nephro or urolithiasis identified. Cholecystectomy clips are seen in the right upper quadrant. Surgical staple line noted within the left mid abdomen. Normal abdominal gas pattern. Several phleboliths are noted within the left hemipelvis.  IMPRESSION: Stable right nephrolithiasis.  No urolithiasis.   Electronically Signed By: Fidela Salisbury MD On: 06/18/2020 23:52  No results found for this or any previous visit.  No results found for this or any previous visit.  No results found for this or any previous visit.  No results found for this or any previous visit.  No results found for this or any previous visit.  No results found for this or any previous visit.  No results found for this or any previous visit.   Assessment & Plan:    1. Kidney stones -We discussed the management of kidney stones. These options include observation, ureteroscopy, shockwave lithotripsy (ESWL) and percutaneous nephrolithotomy  (PCNL). We discussed which options are relevant to the patient's stone(s). We discussed the natural history of kidney stones as well as the complications of untreated stones and the impact on quality of life without treatment as well as with each of the above listed treatments. We also discussed the efficacy of each treatment in its ability to clear the stone burden. With any of these management options I discussed the signs and symptoms of infection and the need for emergent treatment should these be experienced. For each option we discussed the ability of each procedure to clear the patient of their stone burden.   For observation I described the risks which include but are not limited to silent renal damage, life-threatening infection, need for emergent surgery, failure to pass stone and pain.   For ureteroscopy I described the risks which include bleeding, infection, damage to contiguous structures, positioning injury,  ureteral stricture, ureteral avulsion, ureteral injury, need for prolonged ureteral stent, inability to perform ureteroscopy, need for an interval procedure, inability to clear stone burden, stent discomfort/pain, heart attack, stroke, pulmonary embolus and the inherent risks with general anesthesia.   For shockwave lithotripsy I described the risks which include arrhythmia, kidney contusion, kidney hemorrhage, need for transfusion, pain, inability to adequately break up stone, inability to pass stone fragments, Steinstrasse, infection associated with obstructing stones, need for alternate surgical procedure, need for repeat shockwave lithotripsy, MI, CVA, PE and the inherent risks with anesthesia/conscious sedation.   For PCNL I described the risks including positioning injury, pneumothorax, hydrothorax, need for chest tube, inability to clear stone burden, renal laceration, arterial venous fistula or malformation, need for embolization of kidney, loss of kidney or renal function, need  for repeat procedure, need for prolonged nephrostomy tube, ureteral avulsion, MI, CVA, PE and the inherent risks of general anesthesia.   - The patient would like to proceed with right ureteroscopic stone extraction - Urinalysis, Routine w reflex microscopic   No follow-ups on file.  [name], MD  [facility] [place]

## 2020-06-26 NOTE — Patient Instructions (Signed)

## 2020-07-03 ENCOUNTER — Telehealth: Payer: Self-pay

## 2020-07-03 NOTE — Telephone Encounter (Signed)
Patient needing a call back regarding surgery information.  Please call pt back at:  [phone]   Thanks, William Farley

## 2020-07-03 NOTE — Telephone Encounter (Signed)
Please disregard previous note.

## 2020-07-03 NOTE — Telephone Encounter (Signed)
Results sent to Dr. for review by D. Merchant Lpn.  Will notify MD again.

## 2020-07-04 NOTE — Telephone Encounter (Signed)
Pt called wanting to know dates for surgery and follow up. The surgery is scheduled for 5/19 and follow up is set for 6/1 due to you being on vacation. Pt is scheduled to go out of town on 5/31. So can you put the type of stent in that the pt takes out himself? If not pt wants to reschedule.

## 2020-07-08 ENCOUNTER — Telehealth: Payer: Self-pay

## 2020-07-08 ENCOUNTER — Other Ambulatory Visit: Payer: Self-pay | Admitting: Urology

## 2020-07-08 MED ORDER — OXYCODONE-ACETAMINOPHEN 7.5-325 MG PO TABS: 1.0000 | ORAL_TABLET | ORAL | 0 refills | Status: DC | PRN

## 2020-07-08 NOTE — Telephone Encounter (Signed)
Left a VM. Having severe kidney pain.  Needing something to be prescribed.  Thanks, Helene Kelp

## 2020-07-09 ENCOUNTER — Encounter: Payer: Self-pay | Admitting: Urology

## 2020-07-09 NOTE — Telephone Encounter (Signed)
Follow up on patient call and patient received pain medication and is doing well. Patient asked if he would be getting and tethered stent to pull out himself or if he would need to come in before his June 1 follow up appointment for stent  removal since he will be out of town. Please advise.

## 2020-07-10 NOTE — Telephone Encounter (Signed)
Pt.notified

## 2020-08-02 NOTE — Patient Instructions (Signed)
William Farley  08/02/2020     @PREFPERIOPPHARMACY @   Your procedure is scheduled on 08/08/2020   Report to [facility] at  [email or web address].   Call this number if you have problems the morning of surgery:  [phone]   Remember:  Do not eat or drink after midnight.                         Take these medicines the morning of surgery with A SIP OF WATER  Ativan (if needed), omeprazole, oxycodone (if needed), paxil, minipress.   Place clean sheets on your bed the night before your procedure and DO NOT sleep with pets this night.  Shower with CHG the night before and the morning of your procedure and DO NOT use CHG on your face, hair or genitals.  After each shower, dry off with a clean towel, put on clean, comfortable clothes and brush your teeth.      Do not wear jewelry, make-up or nail polish.  Do not wear lotions, powders, or perfumes, or deodorant.  Do not shave 48 hours prior to surgery.  Men may shave face and neck.  Do not bring valuables to the hospital.  [facility] is not responsible for any belongings or valuables.  Contacts, dentures or bridgework may not be worn into surgery.  Leave your suitcase in the car.  After surgery it may be brought to your room.  For patients admitted to the hospital, discharge time will be determined by your treatment team.  Patients discharged the day of surgery will not be allowed to drive home and must have someone with them for 24 hours.   Special instructions:  DO NOT smoke tobacco or vape for 24 hours before your procedure.  Please read over the following fact sheets that you were given. Coughing and Deep Breathing, Surgical Site Infection Prevention, Anesthesia Post-op Instructions and Care and Recovery After Surgery       Ureteral Stent Implantation, Care After This sheet gives you information about how to care for yourself after your procedure. Your health care provider may also give you more specific instructions.  If you have problems or questions, contact your health care provider. What can I expect after the procedure? After the procedure, it is common to have:  Nausea.  Mild pain when you urinate. You may feel this pain in your lower back or lower abdomen. The pain should stop within a few minutes after you urinate. This may last for up to 1 week.  A small amount of blood in your urine for several days. Follow these instructions at home: Medicines  Take over-the-counter and prescription medicines only as told by your health care provider.  If you were prescribed an antibiotic medicine, take it as told by your health care provider. Do not stop taking the antibiotic even if you start to feel better.  Do not drive for 24 hours if you were given a sedative during your procedure.  Ask your health care provider if the medicine prescribed to you requires you to avoid driving or using heavy machinery. Activity  Rest as told by your health care provider.  Avoid sitting for a long time without moving. Get up to take short walks every 1-2 hours. This is important to improve blood flow and breathing. Ask for help if you feel weak or unsteady.  Return to your normal activities as told by your health care provider.  Ask your health care provider what activities are safe for you. General instructions  Watch for any blood in your urine. Call your health care provider if the amount of blood in your urine increases.  If you have a catheter: ? Follow instructions from your health care provider about taking care of your catheter and collection bag. ? Do not take baths, swim, or use a hot tub until your health care provider approves. Ask your health care provider if you may take showers. You may only be allowed to take sponge baths.  Drink enough fluid to keep your urine pale yellow.  Do not use any products that contain nicotine or tobacco, such as cigarettes, e-cigarettes, and chewing tobacco. These can  delay healing after surgery. If you need help quitting, ask your health care provider.  Keep all follow-up visits as told by your health care provider. This is important.   Contact a health care provider if:  You have pain that gets worse or does not get better with medicine, especially pain when you urinate.  You have difficulty urinating.  You feel nauseous or you vomit repeatedly during a period of more than 2 days after the procedure. Get help right away if:  Your urine is dark red or has blood clots in it.  You are leaking urine (have incontinence).  The end of the stent comes out of your urethra.  You cannot urinate.  You have sudden, sharp, or severe pain in your abdomen or lower back.  You have a fever.  You have swelling or pain in your legs.  You have difficulty breathing. Summary  After the procedure, it is common to have mild pain when you urinate that goes away within a few minutes after you urinate. This may last for up to 1 week.  Watch for any blood in your urine. Call your health care provider if the amount of blood in your urine increases.  Take over-the-counter and prescription medicines only as told by your health care provider.  Drink enough fluid to keep your urine pale yellow. This information is not intended to replace advice given to you by your health care provider. Make sure you discuss any questions you have with your health care provider. Document Revised: 12/14/2017 Document Reviewed: 12/15/2017 Elsevier Patient Education  2021 [employer] Anesthesia, Adult, Care After This sheet gives you information about how to care for yourself after your procedure. Your health care provider may also give you more specific instructions. If you have problems or questions, contact your health care provider. What can I expect after the procedure? After the procedure, the following side effects are common:  Pain or discomfort at the IV  site.  Nausea.  Vomiting.  Sore throat.  Trouble concentrating.  Feeling cold or chills.  Feeling weak or tired.  Sleepiness and fatigue.  Soreness and body aches. These side effects can affect parts of the body that were not involved in surgery. Follow these instructions at home: For the time period you were told by your health care provider:  Rest.  Do not participate in activities where you could fall or become injured.  Do not drive or use machinery.  Do not drink alcohol.  Do not take sleeping pills or medicines that cause drowsiness.  Do not make important decisions or sign legal documents.  Do not take care of children on your own.   Eating and drinking  Follow any instructions from your health care provider about eating or  drinking restrictions.  When you feel hungry, start by eating small amounts of foods that are soft and easy to digest (bland), such as toast. Gradually return to your regular diet.  Drink enough fluid to keep your urine pale yellow.  If you vomit, rehydrate by drinking water, juice, or clear broth. General instructions  If you have sleep apnea, surgery and certain medicines can increase your risk for breathing problems. Follow instructions from your health care provider about wearing your sleep device: ? Anytime you are sleeping, including during daytime naps. ? While taking prescription pain medicines, sleeping medicines, or medicines that make you drowsy.  Have a responsible adult stay with you for the time you are told. It is important to have someone help care for you until you are awake and alert.  Return to your normal activities as told by your health care provider. Ask your health care provider what activities are safe for you.  Take over-the-counter and prescription medicines only as told by your health care provider.  If you smoke, do not smoke without supervision.  Keep all follow-up visits as told by your health care  provider. This is important. Contact a health care provider if:  You have nausea or vomiting that does not get better with medicine.  You cannot eat or drink without vomiting.  You have pain that does not get better with medicine.  You are unable to pass urine.  You develop a skin rash.  You have a fever.  You have redness around your IV site that gets worse. Get help right away if:  You have difficulty breathing.  You have chest pain.  You have blood in your urine or stool, or you vomit blood. Summary  After the procedure, it is common to have a sore throat or nausea. It is also common to feel tired.  Have a responsible adult stay with you for the time you are told. It is important to have someone help care for you until you are awake and alert.  When you feel hungry, start by eating small amounts of foods that are soft and easy to digest (bland), such as toast. Gradually return to your regular diet.  Drink enough fluid to keep your urine pale yellow.  Return to your normal activities as told by your health care provider. Ask your health care provider what activities are safe for you. This information is not intended to replace advice given to you by your health care provider. Make sure you discuss any questions you have with your health care provider. Document Revised: 11/23/2019 Document Reviewed: 06/22/2019 Elsevier Patient Education  2021 [employer].

## 2020-08-06 ENCOUNTER — Encounter (HOSPITAL_COMMUNITY): Payer: Self-pay

## 2020-08-06 ENCOUNTER — Other Ambulatory Visit (HOSPITAL_COMMUNITY)
Admission: RE | Admit: 2020-08-06 | Discharge: 2020-08-06 | Disposition: A | Discharge status: Home / Self Care | Payer: No Typology Code available for payment source | Source: Ambulatory Visit | Attending: Urology | Admitting: Urology

## 2020-08-06 ENCOUNTER — Other Ambulatory Visit: Payer: Self-pay

## 2020-08-06 ENCOUNTER — Encounter (HOSPITAL_COMMUNITY)
Admission: RE | Admit: 2020-08-06 | Discharge: 2020-08-06 | Disposition: A | Discharge status: Home / Self Care | Payer: No Typology Code available for payment source | Source: Ambulatory Visit | Attending: Urology | Admitting: Urology

## 2020-08-06 DIAGNOSIS — Z01818 Encounter for other preprocedural examination: Secondary | ICD-10-CM | POA: Insufficient documentation

## 2020-08-06 DIAGNOSIS — Z20822 Contact with and (suspected) exposure to covid-19: Secondary | ICD-10-CM | POA: Diagnosis not present

## 2020-08-06 HISTORY — DX: Personal history of urinary calculi: Z87.442

## 2020-08-06 LAB — BASIC METABOLIC PANEL
Anion gap: 9 (ref 5–15)
BUN: 17 mg/dL (ref 6–20)
CO2: 25 mmol/L (ref 22–32)
Calcium: 8.6 mg/dL — ABNORMAL LOW (ref 8.9–10.3)
Chloride: 107 mmol/L (ref 98–111)
Creatinine, Ser: 0.92 mg/dL (ref 0.61–1.24)
GFR, Estimated: >60 mL/min (ref >60)
Glucose, Bld: 98 mg/dL (ref 70–99)
Potassium: 4 mmol/L (ref 3.5–5.1)
Sodium: 141 mmol/L (ref 135–145)

## 2020-08-06 LAB — CBC WITH DIFFERENTIAL/PLATELET
Abs Immature Granulocytes: 0.02 10*3/uL (ref 0.00–0.07)
Basophils Absolute: 0 10*3/uL (ref 0.0–0.1)
Basophils Relative: 1 %
Eosinophils Absolute: 0.2 10*3/uL (ref 0.0–0.5)
Eosinophils Relative: 4 %
HCT: 42 % (ref 39.0–52.0)
Hemoglobin: 14.3 g/dL (ref 13.0–17.0)
Immature Granulocytes: 0 %
Lymphocytes Relative: 18 %
Lymphs Abs: 0.9 10*3/uL (ref 0.7–4.0)
MCH: 30.7 pg (ref 26.0–34.0)
MCHC: 34 g/dL (ref 30.0–36.0)
MCV: 90.1 fL (ref 80.0–100.0)
Monocytes Absolute: 0.4 10*3/uL (ref 0.1–1.0)
Monocytes Relative: 9 %
Neutro Abs: 3.3 10*3/uL (ref 1.7–7.7)
Neutrophils Relative %: 68 %
Platelets: 128 10*3/uL — ABNORMAL LOW (ref 150–400)
RBC: 4.66 MIL/uL (ref 4.22–5.81)
RDW: 13.4 % (ref 11.5–15.5)
WBC: 4.8 10*3/uL (ref 4.0–10.5)
nRBC: 0 % (ref 0.0–0.2)

## 2020-08-06 LAB — SARS CORONAVIRUS 2 (TAT 6-24 HRS): SARS Coronavirus 2: NEGATIVE

## 2020-08-08 ENCOUNTER — Ambulatory Visit (HOSPITAL_COMMUNITY): Payer: No Typology Code available for payment source | Admitting: Certified Registered Nurse Anesthetist

## 2020-08-08 ENCOUNTER — Encounter (HOSPITAL_COMMUNITY)
Admission: RE | Disposition: A | Discharge status: Home / Self Care | Payer: Self-pay | Source: Home / Self Care | Attending: Urology

## 2020-08-08 ENCOUNTER — Ambulatory Visit (HOSPITAL_COMMUNITY): Payer: No Typology Code available for payment source

## 2020-08-08 ENCOUNTER — Other Ambulatory Visit: Payer: Self-pay

## 2020-08-08 ENCOUNTER — Ambulatory Visit (HOSPITAL_COMMUNITY)
Admission: RE | Admit: 2020-08-08 | Discharge: 2020-08-08 | Disposition: A | Discharge status: Home / Self Care | Payer: No Typology Code available for payment source | Attending: Urology | Admitting: Urology

## 2020-08-08 ENCOUNTER — Encounter (HOSPITAL_COMMUNITY): Payer: Self-pay | Admitting: Urology

## 2020-08-08 DIAGNOSIS — N200 Calculus of kidney: Secondary | ICD-10-CM | POA: Diagnosis not present

## 2020-08-08 DIAGNOSIS — Z87891 Personal history of nicotine dependence: Secondary | ICD-10-CM | POA: Diagnosis not present

## 2020-08-08 DIAGNOSIS — Z96651 Presence of right artificial knee joint: Secondary | ICD-10-CM | POA: Insufficient documentation

## 2020-08-08 DIAGNOSIS — Z885 Allergy status to narcotic agent status: Secondary | ICD-10-CM | POA: Diagnosis not present

## 2020-08-08 HISTORY — PX: CYSTOSCOPY WITH RETROGRADE PYELOGRAM, URETEROSCOPY AND STENT PLACEMENT: SHX5789

## 2020-08-08 HISTORY — PX: STONE EXTRACTION WITH BASKET: SHX5318

## 2020-08-08 SURGERY — CYSTOURETEROSCOPY, WITH RETROGRADE PYELOGRAM AND STENT INSERTION
Anesthesia: General | Laterality: Right

## 2020-08-08 MED ORDER — ORAL CARE MOUTH RINSE: 15.0000 mL | Freq: Once | OROMUCOSAL | Status: DC

## 2020-08-08 MED ORDER — FENTANYL CITRATE (PF) 100 MCG/2ML IJ SOLN
INTRAMUSCULAR | Status: AC
  Filled 2020-08-08: qty 2

## 2020-08-08 MED ORDER — DEXAMETHASONE SODIUM PHOSPHATE 10 MG/ML IJ SOLN
INTRAMUSCULAR | Status: DC | PRN
  Administered 2020-08-08: 5 mg via INTRAVENOUS

## 2020-08-08 MED ORDER — SODIUM CHLORIDE 0.9 % IR SOLN
Status: DC | PRN
  Administered 2020-08-08: 3000 mL via INTRAVESICAL
  Administered 2020-08-08: 3000 mL

## 2020-08-08 MED ORDER — LACTATED RINGERS IV SOLN: INTRAVENOUS | Status: DC

## 2020-08-08 MED ORDER — MIDAZOLAM HCL 2 MG/2ML IJ SOLN
INTRAMUSCULAR | Status: AC
  Filled 2020-08-08: qty 2

## 2020-08-08 MED ORDER — LACTATED RINGERS IV SOLN: INTRAVENOUS | Status: DC | PRN

## 2020-08-08 MED ORDER — FENTANYL CITRATE (PF) 250 MCG/5ML IJ SOLN
INTRAMUSCULAR | Status: DC | PRN
  Administered 2020-08-08 (×2): 25 ug via INTRAVENOUS

## 2020-08-08 MED ORDER — WATER FOR IRRIGATION, STERILE IR SOLN
Status: DC | PRN
  Administered 2020-08-08: 500 mL

## 2020-08-08 MED ORDER — MIDAZOLAM HCL 2 MG/2ML IJ SOLN
INTRAMUSCULAR | Status: DC | PRN
  Administered 2020-08-08: 2 mg via INTRAVENOUS

## 2020-08-08 MED ORDER — ONDANSETRON HCL 4 MG/2ML IJ SOLN
INTRAMUSCULAR | Status: DC | PRN
  Administered 2020-08-08: 4 mg via INTRAVENOUS

## 2020-08-08 MED ORDER — CHLORHEXIDINE GLUCONATE 0.12 % MT SOLN: 15.0000 mL | Freq: Once | OROMUCOSAL | Status: DC

## 2020-08-08 MED ORDER — PROPOFOL 10 MG/ML IV BOLUS
INTRAVENOUS | Status: DC | PRN
  Administered 2020-08-08: 300 mg via INTRAVENOUS

## 2020-08-08 MED ORDER — LIDOCAINE HCL (PF) 2 % IJ SOLN
INTRAMUSCULAR | Status: AC
  Filled 2020-08-08: qty 5

## 2020-08-08 MED ORDER — FENTANYL CITRATE (PF) 100 MCG/2ML IJ SOLN: 25.0000 ug | INTRAMUSCULAR | Status: DC | PRN

## 2020-08-08 MED ORDER — DIATRIZOATE MEGLUMINE 30 % UR SOLN
URETHRAL | Status: DC | PRN
  Administered 2020-08-08: 6 mL

## 2020-08-08 MED ORDER — ONDANSETRON HCL 4 MG/2ML IJ SOLN: 4.0000 mg | Freq: Once | INTRAMUSCULAR | Status: DC | PRN

## 2020-08-08 MED ORDER — ONDANSETRON HCL 4 MG/2ML IJ SOLN
INTRAMUSCULAR | Status: AC
  Filled 2020-08-08: qty 2

## 2020-08-08 MED ORDER — LIDOCAINE HCL (CARDIAC) PF 100 MG/5ML IV SOSY
PREFILLED_SYRINGE | INTRAVENOUS | Status: DC | PRN
  Administered 2020-08-08: 80 mg via INTRAVENOUS

## 2020-08-08 MED ORDER — DIATRIZOATE MEGLUMINE 30 % UR SOLN
URETHRAL | Status: AC
  Filled 2020-08-08: qty 100

## 2020-08-08 MED ORDER — CEFAZOLIN IN SODIUM CHLORIDE 3-0.9 GM/100ML-% IV SOLN
3.0000 g | INTRAVENOUS | Status: AC
  Administered 2020-08-08: 3 g via INTRAVENOUS
  Filled 2020-08-08: qty 100

## 2020-08-08 MED ORDER — OXYCODONE-ACETAMINOPHEN 7.5-325 MG PO TABS: 1.0000 | ORAL_TABLET | ORAL | 0 refills | Status: DC | PRN

## 2020-08-08 SURGICAL SUPPLY — 22 items
BAG DRAIN URO TABLE W/ADPT NS (BAG) ×2 IMPLANT
BAG DRN 8 ADPR NS SKTRN CSTL (BAG) ×1
BAG HAMPER (MISCELLANEOUS) ×2 IMPLANT
CATH INTERMIT  6FR 70CM (CATHETERS) ×2 IMPLANT
CLOTH BEACON ORANGE TIMEOUT ST (SAFETY) ×2 IMPLANT
DECANTER SPIKE VIAL GLASS SM (MISCELLANEOUS) ×2 IMPLANT
EXTRACTOR STONE NITINOL NGAGE (UROLOGICAL SUPPLIES) ×2 IMPLANT
GLOVE BIO SURGEON STRL SZ8 (GLOVE) ×2 IMPLANT
GLOVE SURG UNDER POLY LF SZ7 (GLOVE) ×4 IMPLANT
GOWN STRL REUS W/TWL LRG LVL3 (GOWN DISPOSABLE) ×2 IMPLANT
GOWN STRL REUS W/TWL XL LVL3 (GOWN DISPOSABLE) ×2 IMPLANT
GUIDEWIRE STR DUAL SENSOR (WIRE) ×2 IMPLANT
GUIDEWIRE STR ZIPWIRE 035X150 (MISCELLANEOUS) ×2 IMPLANT
IV NS IRRIG 3000ML ARTHROMATIC (IV SOLUTION) ×4 IMPLANT
KIT TURNOVER CYSTO (KITS) ×2 IMPLANT
MANIFOLD NEPTUNE II (INSTRUMENTS) ×2 IMPLANT
PACK CYSTO (CUSTOM PROCEDURE TRAY) ×2 IMPLANT
PAD ARMBOARD 7.5X6 YLW CONV (MISCELLANEOUS) ×2 IMPLANT
SYR 10ML LL (SYRINGE) ×2 IMPLANT
SYR CONTROL 10ML LL (SYRINGE) ×2 IMPLANT
TOWEL OR 17X26 4PK STRL BLUE (TOWEL DISPOSABLE) ×2 IMPLANT
WATER STERILE IRR 500ML POUR (IV SOLUTION) ×2 IMPLANT

## 2020-08-08 NOTE — Anesthesia Preprocedure Evaluation (Signed)
Anesthesia Evaluation  Patient identified by MRN, date of birth, ID band Patient awake    Reviewed: Allergy & Precautions, H&P , NPO status , Patient's Chart, lab work & pertinent test results, reviewed documented beta blocker date and time   Airway Mallampati: II  TM Distance: >3 FB Neck ROM: full    Dental no notable dental hx.    Pulmonary sleep apnea , former smoker,    Pulmonary exam normal breath sounds clear to auscultation       Cardiovascular Exercise Tolerance: Good negative cardio ROS   Rhythm:regular Rate:Normal     Neuro/Psych PSYCHIATRIC DISORDERS Anxiety Depression negative neurological ROS     GI/Hepatic Neg liver ROS, GERD  Medicated,  Endo/Other  negative endocrine ROS  Renal/GU Renal disease  negative genitourinary   Musculoskeletal   Abdominal   Peds  Hematology negative hematology ROS (+)   Anesthesia Other Findings   Reproductive/Obstetrics negative OB ROS                             Anesthesia Physical Anesthesia Plan  ASA: II  Anesthesia Plan: General and General LMA   Post-op Pain Management:    Induction:   PONV Risk Score and Plan: Ondansetron  Airway Management Planned:   Additional Equipment:   Intra-op Plan:   Post-operative Plan:   Informed Consent: I have reviewed the patients History and Physical, chart, labs and discussed the procedure including the risks, benefits and alternatives for the proposed anesthesia with the patient or authorized representative who has indicated his/her understanding and acceptance.     Dental Advisory Given  Plan Discussed with: CRNA  Anesthesia Plan Comments:         Anesthesia Quick Evaluation

## 2020-08-08 NOTE — Anesthesia Procedure Notes (Signed)
Procedure Name: LMA Insertion Date/Time: 08/08/2020 9:14 AM Performed by: Karna Dupes, CRNA Pre-anesthesia Checklist: Patient identified, Emergency Drugs available, Suction available and Patient being monitored Patient Re-evaluated:Patient Re-evaluated prior to induction Oxygen Delivery Method: Circle system utilized Preoxygenation: Pre-oxygenation with 100% oxygen Induction Type: IV induction LMA: LMA inserted LMA Size: 5.0 Tube secured with: Tape Dental Injury: Teeth and Oropharynx as per pre-operative assessment

## 2020-08-08 NOTE — Op Note (Signed)
Preoperative diagnosis: Right renal stone  Postoperative diagnosis: Same  Procedure: 1 cystoscopy 2.  Right retrograde pyelography 3.  Intraoperative fluoroscopy, under one hour, with interpretation 4.  Right ureteroscopic stone manipulation with basket extraction  Attending: [name]  Anesthesia: General  Estimated blood loss: None  Drains: none  Specimens: Stone for analysis  Antibiotics: ancef  Findings: right 59mm lower pole calculus. No right hydronephrosis  Indications: Patient is a 60 year old male with a history of a right renal stone and who has failed medical expulsive therapy.  After discussing treatment options, he decided proceed with right ureteroscopic stone manipulation.  Procedure her in detail: The patient was brought to the operating room and a brief timeout was done to ensure correct patient, correct procedure, correct site.  General anesthesia was administered patient was placed in dorsal lithotomy position.  Her genitalia was then prepped and draped in usual sterile fashion.  A rigid 35 French cystoscope was passed in the urethra and the bladder.  Bladder was inspected free masses or lesions.  the right ureteral orifices were in the normal orthotopic locations.  a 6 french ureteral catheter was then instilled into the right ureter orifice.  a gentle retrograde was obtained and findings noted above.  we then placed a zip wire through the ureteral catheter and advanced up to the renal pelvis.  we then removed the cystoscope and cannulated the right ureteral orifice with a semirigid ureteroscope.  we  encountered no stone in the ureter. Once we reached the UPJ we then advanced a sensor wire into the renal pelvis. We then advanced a flexible ureteroscope over the sensor wire and up to the renal pelvis.  We encountered a stone in the lower pole which was removed with an NGage basket. We elected not to place a stent since this was an uncomplicated ureteroscopy.   the  bladder was then drained and this concluded the procedure which was well tolerated by patient.  Complications: None  Condition: Stable, extubated, transferred to PACU  Plan: Patient is to be discharged home as to follow-up in 2 weeks

## 2020-08-08 NOTE — Anesthesia Postprocedure Evaluation (Signed)
Anesthesia Post Note  Patient: William Farley  Procedure(s) Performed: CYSTOSCOPY WITH RIGHT RETROGRADE PYELOGRAM AND RIGHT URETEROSCOPY (Right ) STONE EXTRACTION WITH BASKET (Right )  Anesthesia Type: General Anesthetic complications: no   No complications documented.   Last Vitals:  Vitals:   08/08/20 1015 08/08/20 1024  BP: 117/69 127/88  Pulse: 77 66  Resp: 14 16  Temp:  36.6 C  SpO2: 95% 97%    Last Pain:  Vitals:   08/08/20 1024  TempSrc: Oral  PainSc: 0-No pain                 [name]

## 2020-08-08 NOTE — Discharge Instructions (Signed)
Ureteroscopy Ureteroscopy is a procedure to check for and treat problems inside part of the urinary tract. In this procedure, a thin, flexible tube with a light at the end (ureteroscope) is used to look at the inside of the kidneys and the ureters. The ureters are the tubes that carry urine from the kidneys to the bladder. The ureteroscope is inserted into one or both of the ureters. You may need this procedure if you have frequent urinary tract infections (UTIs), blood in your urine, or a stone in one of your ureters. A ureteroscopy can be done:  To find the cause of urine blockage in a ureter and to evaluate other abnormalities inside the ureters or kidneys.  To remove stones.  To remove or treat growths of tissue (polyps), abnormal tissue, and some types of tumors.  To remove a tissue sample and check it for disease under a microscope (biopsy). Tell a health care provider about:  Any allergies you have.  All medicines you are taking, including vitamins, herbs, eye drops, creams, and over-the-counter medicines.  Any problems you or family members have had with anesthetic medicines.  Any blood disorders you have.  Any surgeries you have had.  Any medical conditions you have.  Whether you are pregnant or may be pregnant. What are the risks? Generally, this is a safe procedure. However, problems may occur, including:  Bleeding.  Infection.  Allergic reactions to medicines.  Scarring that narrows the ureter (stricture).  Creating a hole in the ureter (perforation). What happens before the procedure? Staying hydrated Follow instructions from your health care provider about hydration, which may include:  Up to 2 hours before the procedure - you may continue to drink clear liquids, such as water, clear fruit juice, black coffee, and plain tea.   Eating and drinking restrictions Follow instructions from your health care provider about eating and drinking, which may include:  8  hours before the procedure - stop eating heavy meals or foods, such as meat, fried foods, or fatty foods.  6 hours before the procedure - stop eating light meals or foods, such as toast or cereal.  6 hours before the procedure - stop drinking milk or drinks that contain milk.  2 hours before the procedure - stop drinking clear liquids. Medicines Ask your health care provider about:  Changing or stopping your regular medicines. This is especially important if you are taking diabetes medicines or blood thinners.  Taking medicines such as aspirin and ibuprofen. These medicines can thin your blood. Do not take these medicines unless your health care provider tells you to take them.  Taking over-the-counter medicines, vitamins, herbs, and supplements. General instructions  Do not use any products that contain nicotine or tobacco for at least 4 weeks before the procedure. These products include cigarettes, e-cigarettes, and chewing tobacco. If you need help quitting, ask your health care provider.  You may have a urine sample taken to check for infection.  Plan to have someone take you home from the hospital or clinic.  If you will be going home right after the procedure, plan to have someone with you for 24 hours.  Ask your health care provider what steps will be taken to help prevent infection. These may include: ? Washing skin with a germ-killing soap. ? Receiving antibiotic medicine. What happens during the procedure?  An IV will be inserted into one of your veins.  You will be given one or more of the following: ? A medicine to help  you relax (sedative). ? A medicine to make you fall asleep (general anesthetic). ? A medicine that is injected into your spine to numb the area below and slightly above the injection site (spinal anesthetic).  The part of your body that drains urine from your bladder (urethra) will be cleaned with a germ-killing solution.  The ureteroscope will be  passed through your urethra into your bladder.  A salt-water solution will be sent through the ureteroscope to fill your bladder. This will help the health care provider see the openings of your ureters more clearly.  The ureteroscope will be passed into your ureter. ? If a growth is found, a biopsy may be done. ? If a stone is found, it may be removed through the ureteroscope, or the stone may be broken up using a laser, shock waves, or electrical energy. ? In some cases, if the ureter is too small, a tube may be inserted that keeps the ureter open (ureteral stent). The stent may be left in place for 1 or 2 weeks to keep the ureter open, and then the ureteroscopy procedure will be done.  The scope will be removed, and your bladder will be emptied. The procedure may vary among health care providers and hospitals.   What can I expect after the procedure? After your procedure, it is common to have:  Your blood pressure, heart rate, breathing rate, and blood oxygen level monitored until you leave the hospital or clinic.  A burning sensation when you urinate. You may be asked to urinate.  Blood in your urine.  Mild discomfort in your bladder area or kidney area when urinating.  A need to urinate more often or urgently. Follow these instructions at home: Medicines  Take over-the-counter and prescription medicines only as told by your health care provider.  If you were prescribed an antibiotic medicine, take it as told by your health care provider. Do not stop taking the antibiotic even if you start to feel better. General instructions  If you were given a sedative during the procedure, it can affect you for several hours. Do not drive or operate machinery until your health care provider says that it is safe.  To relieve burning, take a warm bath or hold a warm washcloth over your groin.  Drink enough fluid to keep your urine pale yellow. ? Drink two 8-ounce (237 mL) glasses of water  every hour for the first 2 hours after you get home. ? Continue to drink water often at home.  You can eat what you normally do.  Keep all follow-up visits as told by your health care provider. This is important. ? If you had a ureteral stent placed, ask your health care provider when you need to return to have it removed.   Contact a health care provider if you have:  Chills or a fever.  Burning pain for longer than 24 hours after the procedure.  Blood in your urine for longer than 24 hours after the procedure. Get help right away if you have:  Large amounts of blood in your urine.  Blood clots in your urine.  Severe pain.  Chest pain or trouble breathing.  The feeling of a full bladder and you are unable to urinate. These symptoms may represent a serious problem that is an emergency. Do not wait to see if the symptoms will go away. Get medical help right away. Call your local emergency services (911 in the [place] Summary  Ureteroscopy is a procedure to  check for and treat problems inside part of the urinary tract.  In this procedure, a thin, flexible tube with a light at the end (ureteroscope) is used to look at the inside of the kidneys and the ureters.  You may need this procedure if you have frequent urinary tract infections (UTIs), blood in your urine, or a stone in a ureter. This information is not intended to replace advice given to you by your health care provider. Make sure you discuss any questions you have with your health care provider. Document Revised: 12/14/2018 Document Reviewed: 12/14/2018 Elsevier Patient Education  [employer].

## 2020-08-08 NOTE — Transfer of Care (Signed)
Immediate Anesthesia Transfer of Care Note  Patient: William Farley  Procedure(s) Performed: CYSTOSCOPY WITH RIGHT RETROGRADE PYELOGRAM AND RIGHT URETEROSCOPY (Right ) STONE EXTRACTION WITH BASKET  Patient Location: PACU  Anesthesia Type:General  Level of Consciousness: awake, alert  and oriented  Airway & Oxygen Therapy: Patient Spontanous Breathing and Patient connected to nasal cannula oxygen  Post-op Assessment: Report given to RN and Post -op Vital signs reviewed and stable  Post vital signs: Reviewed and stable  Last Vitals:  Vitals Value Taken Time  BP 107/67   Temp    Pulse 73   Resp    SpO2 96     Last Pain:  Vitals:   08/08/20 0814  TempSrc: Oral  PainSc: 3          Complications: No complications documented.

## 2020-08-08 NOTE — H&P (Signed)
Urology Admission H&P  Chief Complaint: right renal calculus  History of Present Illness: [name] is a 60yo with a hx of a 3mm right renal calculus who failed to pass the fragments after ESWL. No flank pain currently.   Past Medical History:  Diagnosis Date  . Anxiety   . Arthritis   . Depression   . GERD (gastroesophageal reflux disease)   . History of kidney stones   . Kidney stone   . Sleep apnea    wears CPAP    Past Surgical History:  Procedure Laterality Date  . APPENDECTOMY    . CERVICAL SPINE SURGERY  2018  . CHOLECYSTECTOMY    . CYST EXCISION    . CYSTOSCOPY    . EXTRACORPOREAL SHOCK WAVE LITHOTRIPSY Right 05/21/2020   Procedure: EXTRACORPOREAL SHOCK WAVE LITHOTRIPSY (ESWL);  Surgeon: Cleon Gustin, MD;  Location: [facility];  Service: Urology;  Laterality: Right;  . GASTRIC BYPASS OPEN  2003  . INCISIONAL HERNIA REPAIR    . kidney stone removal    . KNEE ARTHROSCOPY Left    multiple  . NECK SURGERY    . REPLACEMENT TOTAL KNEE Right   . RHINOPLASTY      Home Medications:  Current Facility-Administered Medications  Medication Dose Route Frequency Provider Last Rate Last Admin  . ceFAZolin (ANCEF) IVPB 3g/100 mL premix  3 g Intravenous 30 min Pre-Op Shrika Milos, Candee Furbish, MD       Allergies:  Allergies  Allergen Reactions  . Codeine Itching, Rash and Other (See Comments)    Family History  Problem Relation Age of Onset  . Kidney failure Father    Social History:  reports that he quit smoking about 24 years ago. His smoking use included cigarettes. He has a 5.00 pack-year smoking history. He has never used smokeless tobacco. He reports that he does not drink alcohol and does not use drugs.  Review of Systems  All other systems reviewed and are negative.   Physical Exam:  Vital signs in last 24 hours: Temp:  [98.3 F (36.8 C)] 98.3 F (36.8 C) (05/19 0814) Pulse Rate:  [72] 72 (05/19 0814) Resp:  [16] 16 (05/19 0814) BP: (128)/(76) 128/76 (05/19  0814) SpO2:  [99 %] 99 % (05/19 5284) Physical Exam Vitals reviewed.  Constitutional:      Appearance: Normal appearance.  HENT:     Head: Normocephalic and atraumatic.     Nose: Nose normal.     Mouth/Throat:     Mouth: Mucous membranes are moist.  Eyes:     Extraocular Movements: Extraocular movements intact.     Pupils: Pupils are equal, round, and reactive to light.  Cardiovascular:     Rate and Rhythm: Normal rate and regular rhythm.  Pulmonary:     Effort: Pulmonary effort is normal. No respiratory distress.  Abdominal:     General: Abdomen is flat. There is no distension.  Musculoskeletal:        General: No swelling. Normal range of motion.     Cervical back: Normal range of motion and neck supple.  Skin:    General: Skin is warm and dry.  Neurological:     General: No focal deficit present.     Mental Status: He is alert and oriented to person, place, and time.  Psychiatric:        Mood and Affect: Mood normal.        Behavior: Behavior normal.        Thought Content:  Thought content normal.        Judgment: Judgment normal.     Laboratory Data:  No results found for this or any previous visit (from the past 24 hour(s)). Recent Results (from the past 240 hour(s))  SARS CORONAVIRUS 2 (TAT 6-24 HRS) Nasopharyngeal Nasopharyngeal Swab     Status: None   Collection Time: 08/06/20  7:56 AM   Specimen: Nasopharyngeal Swab  Result Value Ref Range Status   SARS Coronavirus 2 NEGATIVE NEGATIVE Final    Comment: (NOTE) SARS-CoV-2 target nucleic acids are NOT DETECTED.  The SARS-CoV-2 RNA is generally detectable in upper and lower respiratory specimens during the acute phase of infection. Negative results do not preclude SARS-CoV-2 infection, do not rule out co-infections with other pathogens, and should not be used as the sole basis for treatment or other patient management decisions. Negative results must be combined with clinical observations, patient history,  and epidemiological information. The expected result is Negative.  Fact Sheet for Patients: SugarRoll.be  Fact Sheet for Healthcare Providers: https://www.woods-mathews.com/  This test is not yet approved or cleared by the [place] FDA and  has been authorized for detection and/or diagnosis of SARS-CoV-2 by FDA under an Emergency Use Authorization (EUA). This EUA will remain  in effect (meaning this test can be used) for the duration of the COVID-19 declaration under Se ction 564(b)(1) of the Act, 21 U.S.C. section 360bbb-3(b)(1), unless the authorization is terminated or revoked sooner.  Performed at [facility], [place] [address], [place],  [address]    Creatinine: Recent Labs    08/06/20 0756  CREATININE 0.92   Baseline Creatinine: 0.9  Impression/Assessment:  59yo with a right renal calculus  Plan:  -We discussed the management of kidney stones. These options include observation, ureteroscopy, shockwave lithotripsy (ESWL) and percutaneous nephrolithotomy (PCNL). We discussed which options are relevant to the patient's stone(s). We discussed the natural history of kidney stones as well as the complications of untreated stones and the impact on quality of life without treatment as well as with each of the above listed treatments. We also discussed the efficacy of each treatment in its ability to clear the stone burden. With any of these management options I discussed the signs and symptoms of infection and the need for emergent treatment should these be experienced. For each option we discussed the ability of each procedure to clear the patient of their stone burden.   For observation I described the risks which include but are not limited to silent renal damage, life-threatening infection, need for emergent surgery, failure to pass stone and pain.   For ureteroscopy I described the risks which include bleeding, infection,  damage to contiguous structures, positioning injury, ureteral stricture, ureteral avulsion, ureteral injury, need for prolonged ureteral stent, inability to perform ureteroscopy, need for an interval procedure, inability to clear stone burden, stent discomfort/pain, heart attack, stroke, pulmonary embolus and the inherent risks with general anesthesia.   For shockwave lithotripsy I described the risks which include arrhythmia, kidney contusion, kidney hemorrhage, need for transfusion, pain, inability to adequately break up stone, inability to pass stone fragments, Steinstrasse, infection associated with obstructing stones, need for alternate surgical procedure, need for repeat shockwave lithotripsy, MI, CVA, PE and the inherent risks with anesthesia/conscious sedation.   For PCNL I described the risks including positioning injury, pneumothorax, hydrothorax, need for chest tube, inability to clear stone burden, renal laceration, arterial venous fistula or malformation, need for embolization of kidney, loss of kidney or  renal function, need for repeat procedure, need for prolonged nephrostomy tube, ureteral avulsion, MI, CVA, PE and the inherent risks of general anesthesia.   - The patient would like to proceed with right ureteroscopic stone extraction  [name] 08/08/2020, 8:48 AM

## 2020-08-09 ENCOUNTER — Telehealth: Payer: Self-pay

## 2020-08-09 ENCOUNTER — Encounter (HOSPITAL_COMMUNITY): Payer: Self-pay | Admitting: Urology

## 2020-08-09 NOTE — Telephone Encounter (Signed)
My chart message sent to patient paperwork completed.

## 2020-08-09 NOTE — Telephone Encounter (Signed)
Patient dropped off paperwork to be completed for his [facility] claim.  Call back when completed.  Thanks, Helene Kelp

## 2020-08-13 LAB — CALCULI, WITH PHOTOGRAPH (CLINICAL LAB)
Calcium Oxalate Monohydrate: 100 %
Weight Calculi: 13 mg

## 2020-08-21 ENCOUNTER — Ambulatory Visit: Payer: No Typology Code available for payment source | Admitting: Urology

## 2020-09-03 ENCOUNTER — Telehealth (INDEPENDENT_AMBULATORY_CARE_PROVIDER_SITE_OTHER): Payer: No Typology Code available for payment source | Admitting: Urology

## 2020-09-03 ENCOUNTER — Other Ambulatory Visit: Payer: Self-pay

## 2020-09-03 ENCOUNTER — Encounter: Payer: Self-pay | Admitting: Urology

## 2020-09-03 DIAGNOSIS — N200 Calculus of kidney: Secondary | ICD-10-CM

## 2020-09-03 NOTE — Patient Instructions (Signed)
Textbook of Natural Medicine (5th ed., pp. 1518-1527.e3). [place], [place]: Elsevier.">  Dietary Guidelines to Help Prevent Kidney Stones Kidney stones are deposits of minerals and salts that form inside your kidneys. Your risk of developing kidney stones may be greater depending on your diet, your lifestyle, the medicines you take, and whether you have certain medical conditions. Most people can lower their chances of developing kidney stones by following the instructions below. Your dietitian may give you more specific instructions depending on your overall health and the type of kidney stones youtend to develop. What are tips for following this plan? Reading food labels  Choose foods with "no salt added" or "low-salt" labels. Limit your salt (sodium) intake to less than 1,500 mg a day. Choose foods with calcium for each meal and snack. Try to eat about 300 mg of calcium at each meal. Foods that contain 200-500 mg of calcium a serving include: 8 oz (237 mL) of milk, calcium-fortifiednon-dairy milk, and calcium-fortifiedfruit juice. Calcium-fortified means that calcium has been added to these drinks. 8 oz (237 mL) of kefir, yogurt, and soy yogurt. 4 oz (114 g) of tofu. 1 oz (28 g) of cheese. 1 cup (150 g) of dried figs. 1 cup (91 g) of cooked broccoli. One 3 oz (85 g) can of sardines or mackerel. Most people need 1,000-1,500 mg of calcium a day. Talk to your dietitian abouthow much calcium is recommended for you. Shopping Buy plenty of fresh fruits and vegetables. Most people do not need to avoid fruits and vegetables, even if these foods contain nutrients that may contribute to kidney stones. When shopping for convenience foods, choose: Whole pieces of fruit. Pre-made salads with dressing on the side. Low-fat fruit and yogurt smoothies. Avoid buying frozen meals or prepared deli foods. These can be high in sodium. Look for foods with live cultures, such as yogurt and kefir. Choose high-fiber  grains, such as whole-wheat breads, oat bran, and wheat cereals. Cooking Do not add salt to food when cooking. Place a salt shaker on the table and allow each person to add his or her own salt to taste. Use vegetable protein, such as beans, textured vegetable protein (TVP), or tofu, instead of meat in pasta, casseroles, and soups. Meal planning Eat less salt, if told by your dietitian. To do this: Avoid eating processed or pre-made food. Avoid eating fast food. Eat less animal protein, including cheese, meat, poultry, or fish, if told by your dietitian. To do this: Limit the number of times you have meat, poultry, fish, or cheese each week. Eat a diet free of meat at least 2 days a week. Eat only one serving each day of meat, poultry, fish, or seafood. When you prepare animal protein, cut pieces into small portion sizes. For most meat and fish, one serving is about the size of the palm of your hand. Eat at least five servings of fresh fruits and vegetables each day. To do this: Keep fruits and vegetables on hand for snacks. Eat one piece of fruit or a handful of berries with breakfast. Have a salad and fruit at lunch. Have two kinds of vegetables at dinner. Limit foods that are high in a substance called oxalate. These include: Spinach (cooked), rhubarb, beets, sweet potatoes, and Swiss chard. Peanuts. Potato chips, french fries, and baked potatoes with skin on. Nuts and nut products. Chocolate. If you regularly take a diuretic medicine, make sure to eat at least 1 or 2 servings of fruits or vegetables that are   high in potassium each day. These include: Avocado. Banana. Orange, prune, carrot, or tomato juice. Baked potato. Cabbage. Beans and split peas. Lifestyle  Drink enough fluid to keep your urine pale yellow. This is the most important thing you can do. Spread your fluid intake throughout the day. If you drink alcohol: Limit how much you use to: 0-1 drink a day for women who  are not pregnant. 0-2 drinks a day for men. Be aware of how much alcohol is in your drink. In the [place] one drink equals one 12 oz bottle of beer (355 mL), one 5 oz glass of wine (148 mL), or one 1 oz glass of hard liquor (44 mL). Lose weight if told by your health care provider. Work with your dietitian to find an eating plan and weight loss strategies that work best for you.  General information Talk to your health care provider and dietitian about taking daily supplements. You may be told the following depending on your health and the cause of your kidney stones: Not to take supplements with vitamin C. To take a calcium supplement. To take a daily probiotic supplement. To take other supplements such as magnesium, fish oil, or vitamin B6. Take over-the-counter and prescription medicines only as told by your health care provider. These include supplements. What foods should I limit? Limit your intake of the following foods, or eat them as told by your dietitian. Vegetables Spinach. Rhubarb. Beets. Canned vegetables. Pickles. Olives. Baked potatoeswith skin. Grains Wheat bran. Baked goods. Salted crackers. Cereals high in sugar. Meats and other proteins Nuts. Nut butters. Large portions of meat, poultry, or fish. Salted, precooked,or cured meats, such as sausages, meat loaves, and hot dogs. Dairy Cheese. Beverages Regular soft drinks. Regular vegetable juice. Seasonings and condiments Seasoning blends with salt. Salad dressings. Soy sauce. Ketchup. Barbecue sauce. Other foods Canned soups. Canned pasta sauce. Casseroles. Pizza. Lasagna. Frozen meals.Potato chips. French fries. The items listed above may not be a complete list of foods and beverages you should limit. Contact a dietitian for more information. What foods should I avoid? Talk to your dietitian about specific foods you should avoid based on the typeof kidney stones you have and your overall health. Fruits Grapefruit. The  item listed above may not be a complete list of foods and beverages you should avoid. Contact a dietitian for more information. Summary Kidney stones are deposits of minerals and salts that form inside your kidneys. You can lower your risk of kidney stones by making changes to your diet. The most important thing you can do is drink enough fluid. Drink enough fluid to keep your urine pale yellow. Talk to your dietitian about how much calcium you should have each day, and eat less salt and animal protein as told by your dietitian. This information is not intended to replace advice given to you by your health care provider. Make sure you discuss any questions you have with your healthcare provider. Document Revised: 03/02/2019 Document Reviewed: 03/02/2019 Elsevier Patient Education  2022 Elsevier Inc.  

## 2020-09-03 NOTE — Progress Notes (Signed)
09/03/2020 2:11 PM   William Farley [birth year] [id number]  Referring provider: Garey Ham, [place] [address]  Patient location: home Physician location: office I connected with  William Farley on 09/03/20 by a video enabled telemedicine application and verified that I am speaking with the correct person using two identifiers.   I discussed the limitations of evaluation and management by telemedicine. The patient expressed understanding and agreed to proceed.    Nephrolithiasis   HPI: Mr Friday is a 60yo here today for followup after ureteroscopic stone extraction. He denies any flank pain. No hematuria or dysuria. No significant LUTS. No other complaints today   PMH: Past Medical History:  Diagnosis Date   Anxiety    Arthritis    Depression    GERD (gastroesophageal reflux disease)    History of kidney stones    Kidney stone    Sleep apnea    wears CPAP     Surgical History: Past Surgical History:  Procedure Laterality Date   APPENDECTOMY     CERVICAL SPINE SURGERY  2018   CHOLECYSTECTOMY     CYST EXCISION     CYSTOSCOPY     CYSTOSCOPY WITH RETROGRADE PYELOGRAM, URETEROSCOPY AND STENT PLACEMENT Right 08/08/2020   Procedure: CYSTOSCOPY WITH RIGHT RETROGRADE PYELOGRAM AND RIGHT URETEROSCOPY;  Surgeon: Cleon Gustin, MD;  Location: [facility];  Service: Urology;  Laterality: Right;   EXTRACORPOREAL SHOCK WAVE LITHOTRIPSY Right 05/21/2020   Procedure: EXTRACORPOREAL SHOCK WAVE LITHOTRIPSY (ESWL);  Surgeon: Cleon Gustin, MD;  Location: [facility];  Service: Urology;  Laterality: Right;   GASTRIC BYPASS OPEN  2003   INCISIONAL HERNIA REPAIR     kidney stone removal     KNEE ARTHROSCOPY Left    multiple   NECK SURGERY     REPLACEMENT TOTAL KNEE Right    RHINOPLASTY     STONE EXTRACTION WITH BASKET Right 08/08/2020   Procedure: STONE EXTRACTION WITH BASKET;  Surgeon: Cleon Gustin, MD;  Location: [facility];  Service: Urology;   Laterality: Right;    Home Medications:  Allergies as of 09/03/2020       Reactions   Codeine Itching, Rash, Other (See Comments)        Medication List        Accurate as of September 03, 2020  2:11 PM. If you have any questions, ask your nurse or doctor.          buprenorphine 15 MCG/HR Commonly known as: BUTRANS Place 1 patch onto the skin once a week.   LORazepam 1 MG tablet Commonly known as: ATIVAN Take 1 mg by mouth 3 (three) times daily as needed for anxiety.   MULTIVITAMIN PO Take 1 packet by mouth in the morning and at bedtime.   Narcan 4 MG/0.1ML Liqd nasal spray kit Generic drug: naloxone Place 1 spray into the nose as needed.   omeprazole 20 MG capsule Commonly known as: PRILOSEC Take 20 mg by mouth in the morning.   oxyCODONE-acetaminophen 7.5-325 MG tablet Commonly known as: Percocet Take 1 tablet by mouth every 4 (four) hours as needed for moderate pain or severe pain.   PARoxetine 20 MG tablet Commonly known as: PAXIL Take 20 mg by mouth in the morning.   prazosin 5 MG capsule Commonly known as: MINIPRESS Take 30 mg by mouth at bedtime.   QUEtiapine 300 MG tablet Commonly known as: SEROQUEL Take 300 mg by mouth at bedtime.   zolpidem 10 MG tablet  Commonly known as: AMBIEN Take 10 mg by mouth at bedtime.        Allergies:  Allergies  Allergen Reactions   Codeine Itching, Rash and Other (See Comments)    Family History: Family History  Problem Relation Age of Onset   Kidney failure Father     Social History:  reports that he quit smoking about 24 years ago. His smoking use included cigarettes. He has a 5.00 pack-year smoking history. He has never used smokeless tobacco. He reports that he does not drink alcohol and does not use drugs.  ROS: All other review of systems were reviewed and are negative except what is noted above in HPI   Laboratory Data: Lab Results  Component Value Date   WBC 4.8 08/06/2020   HGB 14.3  08/06/2020   HCT 42.0 08/06/2020   MCV 90.1 08/06/2020   PLT 128 (L) 08/06/2020    Lab Results  Component Value Date   CREATININE 0.92 08/06/2020    No results found for: PSA  No results found for: TESTOSTERONE  No results found for: HGBA1C  Urinalysis    Component Value Date/Time   APPEARANCEUR Clear 06/26/2020 1352   GLUCOSEU Negative 06/26/2020 1352   BILIRUBINUR Negative 06/26/2020 1352   PROTEINUR Negative 06/26/2020 1352   NITRITE Negative 06/26/2020 1352   LEUKOCYTESUR Negative 06/26/2020 1352    Lab Results  Component Value Date   LABMICR See below: 06/26/2020   WBCUA None seen 06/26/2020   LABEPIT 0-10 06/26/2020   BACTERIA None seen 06/26/2020    Pertinent Imaging:  Results for orders placed during the hospital encounter of 06/18/20  Abdomen 1 view (KUB)  Narrative CLINICAL DATA:  Nephrolithiasis  EXAM: ABDOMEN - 1 VIEW  COMPARISON:  06/05/2020  FINDINGS: 4 mm calculus overlies the lower pole the right kidney, unchanged from a prior CT examination of 02/29/2020. No other definite nephro or urolithiasis identified. Cholecystectomy clips are seen in the right upper quadrant. Surgical staple line noted within the left mid abdomen. Normal abdominal gas pattern. Several phleboliths are noted within the left hemipelvis.  IMPRESSION: Stable right nephrolithiasis.  No urolithiasis.   Electronically Signed By: Fidela Salisbury MD On: 06/18/2020 23:52  No results found for this or any previous visit.  No results found for this or any previous visit.  No results found for this or any previous visit.  No results found for this or any previous visit.  No results found for this or any previous visit.  No results found for this or any previous visit.  No results found for this or any previous visit.   Assessment & Plan:    1. Nephrolithiasis RTC 6 weeks with renal US - Ultrasound renal complete; Future   No follow-ups on  file.  [name], MD  [facility] [place]

## 2020-10-11 ENCOUNTER — Other Ambulatory Visit: Payer: Self-pay

## 2020-10-11 ENCOUNTER — Ambulatory Visit (HOSPITAL_COMMUNITY)
Admission: RE | Admit: 2020-10-11 | Discharge: 2020-10-11 | Disposition: A | Discharge status: Home / Self Care | Payer: No Typology Code available for payment source | Source: Ambulatory Visit | Attending: Urology | Admitting: Urology

## 2020-10-11 DIAGNOSIS — N200 Calculus of kidney: Secondary | ICD-10-CM

## 2020-10-18 ENCOUNTER — Ambulatory Visit: Payer: No Typology Code available for payment source | Admitting: Urology

## 2020-10-28 ENCOUNTER — Encounter: Payer: Self-pay | Admitting: Urology

## 2020-11-15 NOTE — Progress Notes (Signed)
Sent via mychart

## 2020-11-19 ENCOUNTER — Other Ambulatory Visit: Payer: Self-pay

## 2020-11-19 ENCOUNTER — Telehealth (INDEPENDENT_AMBULATORY_CARE_PROVIDER_SITE_OTHER): Payer: Federal, State, Local not specified - PPO | Admitting: Urology

## 2020-11-19 ENCOUNTER — Encounter: Payer: Self-pay | Admitting: Urology

## 2020-11-19 DIAGNOSIS — N200 Calculus of kidney: Secondary | ICD-10-CM

## 2020-11-19 MED ORDER — OXYCODONE-ACETAMINOPHEN 7.5-325 MG PO TABS: 1.0000 | ORAL_TABLET | ORAL | 0 refills | Status: DC | PRN

## 2020-11-19 NOTE — Patient Instructions (Signed)
Textbook of Natural Medicine (5th ed., pp. 1518-1527.e3). [place], [place]: Elsevier.">  Dietary Guidelines to Help Prevent Kidney Stones Kidney stones are deposits of minerals and salts that form inside your kidneys. Your risk of developing kidney stones may be greater depending on your diet, your lifestyle, the medicines you take, and whether you have certain medical conditions. Most people can lower their chances of developing kidney stones by following the instructions below. Your dietitian may give you more specific instructions depending on your overall health and the type of kidney stones youtend to develop. What are tips for following this plan? Reading food labels  Choose foods with "no salt added" or "low-salt" labels. Limit your salt (sodium) intake to less than 1,500 mg a day. Choose foods with calcium for each meal and snack. Try to eat about 300 mg of calcium at each meal. Foods that contain 200-500 mg of calcium a serving include: 8 oz (237 mL) of milk, calcium-fortifiednon-dairy milk, and calcium-fortifiedfruit juice. Calcium-fortified means that calcium has been added to these drinks. 8 oz (237 mL) of kefir, yogurt, and soy yogurt. 4 oz (114 g) of tofu. 1 oz (28 g) of cheese. 1 cup (150 g) of dried figs. 1 cup (91 g) of cooked broccoli. One 3 oz (85 g) can of sardines or mackerel. Most people need 1,000-1,500 mg of calcium a day. Talk to your dietitian abouthow much calcium is recommended for you. Shopping Buy plenty of fresh fruits and vegetables. Most people do not need to avoid fruits and vegetables, even if these foods contain nutrients that may contribute to kidney stones. When shopping for convenience foods, choose: Whole pieces of fruit. Pre-made salads with dressing on the side. Low-fat fruit and yogurt smoothies. Avoid buying frozen meals or prepared deli foods. These can be high in sodium. Look for foods with live cultures, such as yogurt and kefir. Choose high-fiber  grains, such as whole-wheat breads, oat bran, and wheat cereals. Cooking Do not add salt to food when cooking. Place a salt shaker on the table and allow each person to add his or her own salt to taste. Use vegetable protein, such as beans, textured vegetable protein (TVP), or tofu, instead of meat in pasta, casseroles, and soups. Meal planning Eat less salt, if told by your dietitian. To do this: Avoid eating processed or pre-made food. Avoid eating fast food. Eat less animal protein, including cheese, meat, poultry, or fish, if told by your dietitian. To do this: Limit the number of times you have meat, poultry, fish, or cheese each week. Eat a diet free of meat at least 2 days a week. Eat only one serving each day of meat, poultry, fish, or seafood. When you prepare animal protein, cut pieces into small portion sizes. For most meat and fish, one serving is about the size of the palm of your hand. Eat at least five servings of fresh fruits and vegetables each day. To do this: Keep fruits and vegetables on hand for snacks. Eat one piece of fruit or a handful of berries with breakfast. Have a salad and fruit at lunch. Have two kinds of vegetables at dinner. Limit foods that are high in a substance called oxalate. These include: Spinach (cooked), rhubarb, beets, sweet potatoes, and Swiss chard. Peanuts. Potato chips, french fries, and baked potatoes with skin on. Nuts and nut products. Chocolate. If you regularly take a diuretic medicine, make sure to eat at least 1 or 2 servings of fruits or vegetables that are   high in potassium each day. These include: Avocado. Banana. Orange, prune, carrot, or tomato juice. Baked potato. Cabbage. Beans and split peas. Lifestyle  Drink enough fluid to keep your urine pale yellow. This is the most important thing you can do. Spread your fluid intake throughout the day. If you drink alcohol: Limit how much you use to: 0-1 drink a day for women who  are not pregnant. 0-2 drinks a day for men. Be aware of how much alcohol is in your drink. In the [place] one drink equals one 12 oz bottle of beer (355 mL), one 5 oz glass of wine (148 mL), or one 1 oz glass of hard liquor (44 mL). Lose weight if told by your health care provider. Work with your dietitian to find an eating plan and weight loss strategies that work best for you.  General information Talk to your health care provider and dietitian about taking daily supplements. You may be told the following depending on your health and the cause of your kidney stones: Not to take supplements with vitamin C. To take a calcium supplement. To take a daily probiotic supplement. To take other supplements such as magnesium, fish oil, or vitamin B6. Take over-the-counter and prescription medicines only as told by your health care provider. These include supplements. What foods should I limit? Limit your intake of the following foods, or eat them as told by your dietitian. Vegetables Spinach. Rhubarb. Beets. Canned vegetables. Pickles. Olives. Baked potatoeswith skin. Grains Wheat bran. Baked goods. Salted crackers. Cereals high in sugar. Meats and other proteins Nuts. Nut butters. Large portions of meat, poultry, or fish. Salted, precooked,or cured meats, such as sausages, meat loaves, and hot dogs. Dairy Cheese. Beverages Regular soft drinks. Regular vegetable juice. Seasonings and condiments Seasoning blends with salt. Salad dressings. Soy sauce. Ketchup. Barbecue sauce. Other foods Canned soups. Canned pasta sauce. Casseroles. Pizza. Lasagna. Frozen meals.Potato chips. French fries. The items listed above may not be a complete list of foods and beverages you should limit. Contact a dietitian for more information. What foods should I avoid? Talk to your dietitian about specific foods you should avoid based on the typeof kidney stones you have and your overall health. Fruits Grapefruit. The  item listed above may not be a complete list of foods and beverages you should avoid. Contact a dietitian for more information. Summary Kidney stones are deposits of minerals and salts that form inside your kidneys. You can lower your risk of kidney stones by making changes to your diet. The most important thing you can do is drink enough fluid. Drink enough fluid to keep your urine pale yellow. Talk to your dietitian about how much calcium you should have each day, and eat less salt and animal protein as told by your dietitian. This information is not intended to replace advice given to you by your health care provider. Make sure you discuss any questions you have with your healthcare provider. Document Revised: 03/02/2019 Document Reviewed: 03/02/2019 Elsevier Patient Education  2022 Elsevier Inc.  

## 2020-11-19 NOTE — Progress Notes (Signed)
11/19/2020 1:11 PM   William Farley [birth year] [id number]  Referring provider: Alden Hipp, NP [address] [place],  [place] [address]  Patient location: home Physician location: office I connected with  William Farley on 11/19/20 by a video enabled telemedicine application and verified that I am speaking with the correct person using two identifiers.   I discussed the limitations of evaluation and management by telemedicine. The patient expressed understanding and agreed to proceed.    Nephrolithiasis  HPI: Mr General is a 60yo here for followup for nephrolithiasis. For the last 2 weeks he has been having intermittent sharp, severe, nonraditing right flank pain. No other associated symptoms. No LUTS. He has not passed a calculus. Renal US from 7/22 showed a possible 35mm right lower pole calculus. No other complaints today. No other exacerbating/alleviating events.    PMH: Past Medical History:  Diagnosis Date   Anxiety    Arthritis    Depression    GERD (gastroesophageal reflux disease)    History of kidney stones    Kidney stone    Sleep apnea    wears CPAP     Surgical History: Past Surgical History:  Procedure Laterality Date   APPENDECTOMY     CERVICAL SPINE SURGERY  2018   CHOLECYSTECTOMY     CYST EXCISION     CYSTOSCOPY     CYSTOSCOPY WITH RETROGRADE PYELOGRAM, URETEROSCOPY AND STENT PLACEMENT Right 08/08/2020   Procedure: CYSTOSCOPY WITH RIGHT RETROGRADE PYELOGRAM AND RIGHT URETEROSCOPY;  Surgeon: Malen Gauze, MD;  Location: [facility];  Service: Urology;  Laterality: Right;   EXTRACORPOREAL SHOCK WAVE LITHOTRIPSY Right 05/21/2020   Procedure: EXTRACORPOREAL SHOCK WAVE LITHOTRIPSY (ESWL);  Surgeon: Malen Gauze, MD;  Location: [facility];  Service: Urology;  Laterality: Right;   GASTRIC BYPASS OPEN  2003   INCISIONAL HERNIA REPAIR     kidney stone removal     KNEE ARTHROSCOPY Left    multiple   NECK SURGERY     REPLACEMENT TOTAL KNEE Right     RHINOPLASTY     STONE EXTRACTION WITH BASKET Right 08/08/2020   Procedure: STONE EXTRACTION WITH BASKET;  Surgeon: Malen Gauze, MD;  Location: [facility];  Service: Urology;  Laterality: Right;    Home Medications:  Allergies as of 11/19/2020       Reactions   Codeine Itching, Rash, Other (See Comments)        Medication List        Accurate as of November 19, 2020  1:11 PM. If you have any questions, ask your nurse or doctor.          buprenorphine 15 MCG/HR Commonly known as: BUTRANS Place 1 patch onto the skin once a week.   LORazepam 1 MG tablet Commonly known as: ATIVAN Take 1 mg by mouth 3 (three) times daily as needed for anxiety.   MULTIVITAMIN PO Take 1 packet by mouth in the morning and at bedtime.   Narcan 4 MG/0.1ML Liqd nasal spray kit Generic drug: naloxone Place 1 spray into the nose as needed.   omeprazole 20 MG capsule Commonly known as: PRILOSEC Take 20 mg by mouth in the morning.   oxyCODONE-acetaminophen 7.5-325 MG tablet Commonly known as: Percocet Take 1 tablet by mouth every 4 (four) hours as needed for moderate pain or severe pain.   PARoxetine 20 MG tablet Commonly known as: PAXIL Take 20 mg by mouth in the morning.   prazosin 5 MG capsule Commonly known as: MINIPRESS Take  30 mg by mouth at bedtime.   QUEtiapine 300 MG tablet Commonly known as: SEROQUEL Take 300 mg by mouth at bedtime.   zolpidem 10 MG tablet Commonly known as: AMBIEN Take 10 mg by mouth at bedtime.        Allergies:  Allergies  Allergen Reactions   Codeine Itching, Rash and Other (See Comments)    Family History: Family History  Problem Relation Age of Onset   Kidney failure Father     Social History:  reports that he quit smoking about 24 years ago. His smoking use included cigarettes. He has a 5.00 pack-year smoking history. He has never used smokeless tobacco. He reports that he does not drink alcohol and does not use drugs.  ROS: All other  review of systems were reviewed and are negative except what is noted above in HPI   Laboratory Data: Lab Results  Component Value Date   WBC 4.8 08/06/2020   HGB 14.3 08/06/2020   HCT 42.0 08/06/2020   MCV 90.1 08/06/2020   PLT 128 (L) 08/06/2020    Lab Results  Component Value Date   CREATININE 0.92 08/06/2020    No results found for: PSA  No results found for: TESTOSTERONE  No results found for: HGBA1C  Urinalysis    Component Value Date/Time   APPEARANCEUR Clear 06/26/2020 1352   GLUCOSEU Negative 06/26/2020 1352   BILIRUBINUR Negative 06/26/2020 1352   PROTEINUR Negative 06/26/2020 1352   NITRITE Negative 06/26/2020 1352   LEUKOCYTESUR Negative 06/26/2020 1352    Lab Results  Component Value Date   LABMICR See below: 06/26/2020   WBCUA None seen 06/26/2020   LABEPIT 0-10 06/26/2020   BACTERIA None seen 06/26/2020    Pertinent Imaging: Renal US 7/22: Images reviewed and discussed with the patient  Results for orders placed during the hospital encounter of 06/18/20  Abdomen 1 view (KUB)  Narrative CLINICAL DATA:  Nephrolithiasis  EXAM: ABDOMEN - 1 VIEW  COMPARISON:  06/05/2020  FINDINGS: 4 mm calculus overlies the lower pole the right kidney, unchanged from a prior CT examination of 02/29/2020. No other definite nephro or urolithiasis identified. Cholecystectomy clips are seen in the right upper quadrant. Surgical staple line noted within the left mid abdomen. Normal abdominal gas pattern. Several phleboliths are noted within the left hemipelvis.  IMPRESSION: Stable right nephrolithiasis.  No urolithiasis.   Electronically Signed By: Fidela Salisbury MD On: 06/18/2020 23:52  No results found for this or any previous visit.  No results found for this or any previous visit.  No results found for this or any previous visit.  Results for orders placed during the hospital encounter of 10/11/20  Ultrasound renal  complete  Narrative CLINICAL DATA:  History of nephrolithiasis.  EXAM: RENAL / URINARY TRACT ULTRASOUND COMPLETE  COMPARISON:  None.  FINDINGS: Right Kidney:  Renal measurements: 11.8 cm x 7.1 cm x 6.7 cm = volume: 293 mL. Echogenicity within normal limits. A 6 mm shadowing echogenic renal calculus is seen within the lower pole of the right kidney. No mass or hydronephrosis visualized.  Left Kidney:  Renal measurements: 13.8 cm x 5.7 cm x 4.8 cm = volume: 200 mL. Echogenicity within normal limits. No mass or hydronephrosis visualized.  Bladder:  Appears normal for degree of bladder distention.  Other:  None.  IMPRESSION: 6 mm nonobstructing renal stone within the right kidney.   Electronically Signed By: Virgina Norfolk M.D. On: 10/12/2020 19:10  No results found for this or any  previous visit.  No results found for this or any previous visit.  No results found for this or any previous visit.   Assessment & Plan:   Nephrolithiasis -STAT CT stone  -rx for percocet given  No follow-ups on file.  [name], MD  [facility] [place]

## 2020-11-20 ENCOUNTER — Other Ambulatory Visit: Payer: Self-pay

## 2020-11-20 ENCOUNTER — Encounter: Payer: Self-pay | Admitting: Urology

## 2020-11-20 ENCOUNTER — Ambulatory Visit (HOSPITAL_COMMUNITY)
Admission: RE | Admit: 2020-11-20 | Discharge: 2020-11-20 | Disposition: A | Discharge status: Home / Self Care | Payer: Federal, State, Local not specified - PPO | Source: Ambulatory Visit | Attending: Urology | Admitting: Urology

## 2020-11-20 DIAGNOSIS — I700 Atherosclerosis of aorta: Secondary | ICD-10-CM | POA: Diagnosis not present

## 2020-11-20 DIAGNOSIS — Z9049 Acquired absence of other specified parts of digestive tract: Secondary | ICD-10-CM | POA: Diagnosis not present

## 2020-11-20 DIAGNOSIS — N200 Calculus of kidney: Secondary | ICD-10-CM | POA: Insufficient documentation

## 2020-11-20 NOTE — Telephone Encounter (Signed)
Please advise 

## 2020-12-06 ENCOUNTER — Ambulatory Visit (INDEPENDENT_AMBULATORY_CARE_PROVIDER_SITE_OTHER): Payer: Federal, State, Local not specified - PPO | Admitting: Urology

## 2020-12-06 ENCOUNTER — Other Ambulatory Visit: Payer: Self-pay

## 2020-12-06 ENCOUNTER — Encounter: Payer: Self-pay | Admitting: Urology

## 2020-12-06 DIAGNOSIS — N200 Calculus of kidney: Secondary | ICD-10-CM | POA: Diagnosis not present

## 2020-12-06 NOTE — Progress Notes (Signed)
12/06/2020 12:52 PM   William Farley [birth year] [id number]  Referring provider: Garey Ham, [place] [address]  Patient location: home Physician location: office I connected with  William Farley on 12/06/20 by a video enabled telemedicine application and verified that I am speaking with the correct person using two identifiers.   I discussed the limitations of evaluation and management by telemedicine. The patient expressed understanding and agreed to proceed.    Followup nephrolithiasis   HPI: William Farley is a 60yo here for followup for nephrolithiasis. CT from 11/20/2020 showed a right 71mm renal calculus and no ureteral calculi. He has intermittent right flank pain. He is scheduled to undergo a nerve procedure/ablation in the next couple of weeks to address his right flank/abdominal pain. No worsening LUTS. No other complaints today   PMH: Past Medical History:  Diagnosis Date   Anxiety    Arthritis    Depression    GERD (gastroesophageal reflux disease)    History of kidney stones    Kidney stone    Sleep apnea    wears CPAP     Surgical History: Past Surgical History:  Procedure Laterality Date   APPENDECTOMY     CERVICAL SPINE SURGERY  2018   CHOLECYSTECTOMY     CYST EXCISION     CYSTOSCOPY     CYSTOSCOPY WITH RETROGRADE PYELOGRAM, URETEROSCOPY AND STENT PLACEMENT Right 08/08/2020   Procedure: CYSTOSCOPY WITH RIGHT RETROGRADE PYELOGRAM AND RIGHT URETEROSCOPY;  Surgeon: Cleon Gustin, MD;  Location: [facility];  Service: Urology;  Laterality: Right;   EXTRACORPOREAL SHOCK WAVE LITHOTRIPSY Right 05/21/2020   Procedure: EXTRACORPOREAL SHOCK WAVE LITHOTRIPSY (ESWL);  Surgeon: Cleon Gustin, MD;  Location: [facility];  Service: Urology;  Laterality: Right;   GASTRIC BYPASS OPEN  2003   INCISIONAL HERNIA REPAIR     kidney stone removal     KNEE ARTHROSCOPY Left    multiple   NECK SURGERY     REPLACEMENT TOTAL KNEE Right    RHINOPLASTY      STONE EXTRACTION WITH BASKET Right 08/08/2020   Procedure: STONE EXTRACTION WITH BASKET;  Surgeon: Cleon Gustin, MD;  Location: [facility];  Service: Urology;  Laterality: Right;    Home Medications:  Allergies as of 12/06/2020       Reactions   Codeine Itching, Rash, Other (See Comments)        Medication List        Accurate as of December 06, 2020 12:52 PM. If you have any questions, ask your nurse or doctor.          buprenorphine 15 MCG/HR Commonly known as: BUTRANS Place 1 patch onto the skin once a week.   LORazepam 1 MG tablet Commonly known as: ATIVAN Take 1 mg by mouth 3 (three) times daily as needed for anxiety.   MULTIVITAMIN PO Take 1 packet by mouth in the morning and at bedtime.   Narcan 4 MG/0.1ML Liqd nasal spray kit Generic drug: naloxone Place 1 spray into the nose as needed.   omeprazole 20 MG capsule Commonly known as: PRILOSEC Take 20 mg by mouth in the morning.   oxyCODONE-acetaminophen 7.5-325 MG tablet Commonly known as: Percocet Take 1 tablet by mouth every 4 (four) hours as needed for moderate pain or severe pain.   PARoxetine 20 MG tablet Commonly known as: PAXIL Take 20 mg by mouth in the morning.   prazosin 5 MG capsule Commonly known as: MINIPRESS Take 30 mg  by mouth at bedtime.   QUEtiapine 300 MG tablet Commonly known as: SEROQUEL Take 300 mg by mouth at bedtime.   zolpidem 10 MG tablet Commonly known as: AMBIEN Take 10 mg by mouth at bedtime.        Allergies:  Allergies  Allergen Reactions   Codeine Itching, Rash and Other (See Comments)    Family History: Family History  Problem Relation Age of Onset   Kidney failure Father     Social History:  reports that he quit smoking about 24 years ago. His smoking use included cigarettes. He has a 5.00 pack-year smoking history. He has never used smokeless tobacco. He reports that he does not drink alcohol and does not use drugs.  ROS: All other review of  systems were reviewed and are negative except what is noted above in HPI   Laboratory Data: Lab Results  Component Value Date   WBC 4.8 08/06/2020   HGB 14.3 08/06/2020   HCT 42.0 08/06/2020   MCV 90.1 08/06/2020   PLT 128 (L) 08/06/2020    Lab Results  Component Value Date   CREATININE 0.92 08/06/2020    No results found for: PSA  No results found for: TESTOSTERONE  No results found for: HGBA1C  Urinalysis    Component Value Date/Time   APPEARANCEUR Clear 06/26/2020 1352   GLUCOSEU Negative 06/26/2020 1352   BILIRUBINUR Negative 06/26/2020 1352   PROTEINUR Negative 06/26/2020 1352   NITRITE Negative 06/26/2020 1352   LEUKOCYTESUR Negative 06/26/2020 1352    Lab Results  Component Value Date   LABMICR See below: 06/26/2020   WBCUA None seen 06/26/2020   LABEPIT 0-10 06/26/2020   BACTERIA None seen 06/26/2020    Pertinent Imaging: CT stone 11/20/2020: Images reviewed and discussed with the patient Results for orders placed during the hospital encounter of 06/18/20  Abdomen 1 view (KUB)  Narrative CLINICAL DATA:  Nephrolithiasis  EXAM: ABDOMEN - 1 VIEW  COMPARISON:  06/05/2020  FINDINGS: 4 mm calculus overlies the lower pole the right kidney, unchanged from a prior CT examination of 02/29/2020. No other definite nephro or urolithiasis identified. Cholecystectomy clips are seen in the right upper quadrant. Surgical staple line noted within the left mid abdomen. Normal abdominal gas pattern. Several phleboliths are noted within the left hemipelvis.  IMPRESSION: Stable right nephrolithiasis.  No urolithiasis.   Electronically Signed By: Helyn Numbers MD On: 06/18/2020 23:52  No results found for this or any previous visit.  No results found for this or any previous visit.  No results found for this or any previous visit.  Results for orders placed during the hospital encounter of 10/11/20  Ultrasound renal complete  Narrative CLINICAL  DATA:  History of nephrolithiasis.  EXAM: RENAL / URINARY TRACT ULTRASOUND COMPLETE  COMPARISON:  None.  FINDINGS: Right Kidney:  Renal measurements: 11.8 cm x 7.1 cm x 6.7 cm = volume: 293 mL. Echogenicity within normal limits. A 6 mm shadowing echogenic renal calculus is seen within the lower pole of the right kidney. No mass or hydronephrosis visualized.  Left Kidney:  Renal measurements: 13.8 cm x 5.7 cm x 4.8 cm = volume: 200 mL. Echogenicity within normal limits. No mass or hydronephrosis visualized.  Bladder:  Appears normal for degree of bladder distention.  Other:  None.  IMPRESSION: 6 mm nonobstructing renal stone within the right kidney.   Electronically Signed By: Aram Candela M.D. On: 10/12/2020 19:10  No results found for this or any previous visit.  No results found for this or any previous visit.  Results for orders placed in visit on 11/19/20  CT RENAL STONE STUDY  Narrative CLINICAL DATA:  Two weeks of intermittent sharp severe nonradiating right flank pain.  EXAM: CT ABDOMEN AND PELVIS WITHOUT CONTRAST  TECHNIQUE: Multidetector CT imaging of the abdomen and pelvis was performed following the standard protocol without IV contrast.  COMPARISON:  CT February 29, 2020  FINDINGS: Lower chest: No acute abnormality.  Hepatobiliary: Unremarkable noncontrast appearance of the hepatic parenchyma. Gallbladder surgically absent. No biliary ductal dilation.  Pancreas: Within normal limits.  Spleen: Within normal limits.  Adrenals/Urinary Tract: Bilateral adrenal glands are unremarkable. No hydronephrosis. Left kidney is unremarkable. Punctate nonobstructive right lower pole renal stone. No obstructive ureteral or bladder calculus visualized. Urinary bladder is decompressed limiting evaluation.  Stomach/Bowel: Prior gastric bypass. No pathologic dilation of small or large bowel. Appendix is surgically absent.  Vascular/Lymphatic:  Aortic atherosclerosis without abdominal aortic aneurysm. No pathologically enlarged abdominal or pelvic lymph nodes.  Reproductive: Prostate is unremarkable.  Other: Fat in bilateral inguinal canals.  No abdominopelvic ascites.  Musculoskeletal: Mild thoracolumbar spondylosis. No acute osseous abnormality.  IMPRESSION: 1. No acute findings in the abdomen or pelvis. 2. Punctate nonobstructive right lower pole renal stone. No obstructive ureteral or bladder calculus visualized. 3. Prior gastric bypass and cholecystectomy. 4.  Aortic Atherosclerosis (ICD10-I70.0).   Electronically Signed By: Dahlia Bailiff M.D. On: 11/20/2020 11:11   Assessment & Plan:    1. Nephrolithiasis -RTC 6 months with KUB   No follow-ups on file.  [name], MD  [facility] [place]

## 2020-12-06 NOTE — Patient Instructions (Signed)
Dietary Guidelines to Help Prevent Kidney Stones Kidney stones are deposits of minerals and salts that form inside your kidneys. Your risk of developing kidney stones may be greater depending on your diet, your lifestyle, the medicines you take, and whether you have certain medical conditions. Most people can lower their chances of developing kidney stones by following the instructions below. Your dietitian may give you more specific instructions depending on your overall health and the type of kidney stones you tend to develop. What are tips for following this plan? Reading food labels  Choose foods with "no salt added" or "low-salt" labels. Limit your salt (sodium) intake to less than 1,500 mg a day. Choose foods with calcium for each meal and snack. Try to eat about 300 mg of calcium at each meal. Foods that contain 200-500 mg of calcium a serving include: 8 oz (237 mL) of milk, calcium-fortifiednon-dairy milk, and calcium-fortifiedfruit juice. Calcium-fortified means that calcium has been added to these drinks. 8 oz (237 mL) of kefir, yogurt, and soy yogurt. 4 oz (114 g) of tofu. 1 oz (28 g) of cheese. 1 cup (150 g) of dried figs. 1 cup (91 g) of cooked broccoli. One 3 oz (85 g) can of sardines or mackerel. Most people need 1,000-1,500 mg of calcium a day. Talk to your dietitian about how much calcium is recommended for you. Shopping Buy plenty of fresh fruits and vegetables. Most people do not need to avoid fruits and vegetables, even if these foods contain nutrients that may contribute to kidney stones. When shopping for convenience foods, choose: Whole pieces of fruit. Pre-made salads with dressing on the side. Low-fat fruit and yogurt smoothies. Avoid buying frozen meals or prepared deli foods. These can be high in sodium. Look for foods with live cultures, such as yogurt and kefir. Choose high-fiber grains, such as whole-wheat breads, oat bran, and wheat cereals. Cooking Do not add  salt to food when cooking. Place a salt shaker on the table and allow each person to add his or her own salt to taste. Use vegetable protein, such as beans, textured vegetable protein (TVP), or tofu, instead of meat in pasta, casseroles, and soups. Meal planning Eat less salt, if told by your dietitian. To do this: Avoid eating processed or pre-made food. Avoid eating fast food. Eat less animal protein, including cheese, meat, poultry, or fish, if told by your dietitian. To do this: Limit the number of times you have meat, poultry, fish, or cheese each week. Eat a diet free of meat at least 2 days a week. Eat only one serving each day of meat, poultry, fish, or seafood. When you prepare animal protein, cut pieces into small portion sizes. For most meat and fish, one serving is about the size of the palm of your hand. Eat at least five servings of fresh fruits and vegetables each day. To do this: Keep fruits and vegetables on hand for snacks. Eat one piece of fruit or a handful of berries with breakfast. Have a salad and fruit at lunch. Have two kinds of vegetables at dinner. Limit foods that are high in a substance called oxalate. These include: Spinach (cooked), rhubarb, beets, sweet potatoes, and Swiss chard. Peanuts. Potato chips, french fries, and baked potatoes with skin on. Nuts and nut products. Chocolate. If you regularly take a diuretic medicine, make sure to eat at least 1 or 2 servings of fruits or vegetables that are high in potassium each day. These include: Avocado. Banana. Orange, prune,   carrot, or tomato juice. Baked potato. Cabbage. Beans and split peas. Lifestyle  Drink enough fluid to keep your urine pale yellow. This is the most important thing you can do. Spread your fluid intake throughout the day. If you drink alcohol: Limit how much you use to: 0-1 drink a day for women who are not pregnant. 0-2 drinks a day for men. Be aware of how much alcohol is in your  drink. In the [place] one drink equals one 12 oz bottle of beer (355 mL), one 5 oz glass of wine (148 mL), or one 1 oz glass of hard liquor (44 mL). Lose weight if told by your health care provider. Work with your dietitian to find an eating plan and weight loss strategies that work best for you. General information Talk to your health care provider and dietitian about taking daily supplements. You may be told the following depending on your health and the cause of your kidney stones: Not to take supplements with vitamin C. To take a calcium supplement. To take a daily probiotic supplement. To take other supplements such as magnesium, fish oil, or vitamin B6. Take over-the-counter and prescription medicines only as told by your health care provider. These include supplements. What foods should I limit? Limit your intake of the following foods, or eat them as told by your dietitian. Vegetables Spinach. Rhubarb. Beets. Canned vegetables. Pickles. Olives. Baked potatoes with skin. Grains Wheat bran. Baked goods. Salted crackers. Cereals high in sugar. Meats and other proteins Nuts. Nut butters. Large portions of meat, poultry, or fish. Salted, precooked, or cured meats, such as sausages, meat loaves, and hot dogs. Dairy Cheese. Beverages Regular soft drinks. Regular vegetable juice. Seasonings and condiments Seasoning blends with salt. Salad dressings. Soy sauce. Ketchup. Barbecue sauce. Other foods Canned soups. Canned pasta sauce. Casseroles. Pizza. Lasagna. Frozen meals. Potato chips. French fries. The items listed above may not be a complete list of foods and beverages you should limit. Contact a dietitian for more information. What foods should I avoid? Talk to your dietitian about specific foods you should avoid based on the type of kidney stones you have and your overall health. Fruits Grapefruit. The item listed above may not be a complete list of foods and beverages you should  avoid. Contact a dietitian for more information. Summary Kidney stones are deposits of minerals and salts that form inside your kidneys. You can lower your risk of kidney stones by making changes to your diet. The most important thing you can do is drink enough fluid. Drink enough fluid to keep your urine pale yellow. Talk to your dietitian about how much calcium you should have each day, and eat less salt and animal protein as told by your dietitian. This information is not intended to replace advice given to you by your health care provider. Make sure you discuss any questions you have with your health care provider. Document Revised: 03/02/2019 Document Reviewed: 03/02/2019 Elsevier Patient Education  2022 [employer] Inc[employer]  

## 2021-01-01 ENCOUNTER — Encounter (INDEPENDENT_AMBULATORY_CARE_PROVIDER_SITE_OTHER): Payer: Self-pay | Admitting: *Deleted

## 2021-01-06 ENCOUNTER — Encounter: Payer: Self-pay | Admitting: Urology

## 2021-01-06 NOTE — Telephone Encounter (Signed)
Please advise 

## 2021-01-07 ENCOUNTER — Other Ambulatory Visit: Payer: Self-pay | Admitting: Urology

## 2021-01-07 MED ORDER — OXYCODONE-ACETAMINOPHEN 7.5-325 MG PO TABS: 1.0000 | ORAL_TABLET | ORAL | 0 refills | Status: DC | PRN

## 2021-02-19 ENCOUNTER — Telehealth: Payer: Self-pay | Admitting: Urology

## 2021-02-19 NOTE — Telephone Encounter (Signed)
Per Kenn File at the [facility]. They will not update the auth due to the patient was supposed to go to the [facility] for this and he refused and patient stated that "he would use his own insurance". Per Kenn File with the [place] [phone] x [phone]   Patient [place] ran out on 11/17/20 and CT was done on 11/20/20

## 2021-02-20 DIAGNOSIS — R569 Unspecified convulsions: Secondary | ICD-10-CM

## 2021-02-20 HISTORY — DX: Unspecified convulsions: R56.9

## 2021-02-28 DIAGNOSIS — R569 Unspecified convulsions: Secondary | ICD-10-CM | POA: Diagnosis not present

## 2021-02-28 DIAGNOSIS — E039 Hypothyroidism, unspecified: Secondary | ICD-10-CM | POA: Diagnosis not present

## 2021-02-28 DIAGNOSIS — R55 Syncope and collapse: Secondary | ICD-10-CM | POA: Diagnosis not present

## 2021-03-07 DIAGNOSIS — R55 Syncope and collapse: Secondary | ICD-10-CM | POA: Diagnosis not present

## 2021-03-07 DIAGNOSIS — Z23 Encounter for immunization: Secondary | ICD-10-CM | POA: Diagnosis not present

## 2021-03-07 DIAGNOSIS — Z6838 Body mass index (BMI) 38.0-38.9, adult: Secondary | ICD-10-CM | POA: Diagnosis not present

## 2021-03-07 DIAGNOSIS — D696 Thrombocytopenia, unspecified: Secondary | ICD-10-CM | POA: Insufficient documentation

## 2021-03-07 DIAGNOSIS — R569 Unspecified convulsions: Secondary | ICD-10-CM | POA: Diagnosis not present

## 2021-03-10 ENCOUNTER — Encounter: Payer: Self-pay | Admitting: Cardiology

## 2021-03-10 NOTE — Progress Notes (Signed)
Cardiology Office Note  Date: 03/11/2021   ID: Carson, Bogden [birth year], MRN [id number]  PCP:  Garey Ham, NP  Cardiologist:  Rozann Lesches, MD Electrophysiologist:  None   Chief Complaint  Patient presents with   Possible syncope    History of Present Illness: William Farley is a 60 y.o. male referred for cardiology consultation by Dr. Ileana Roup for evaluation of possible syncope.  I reviewed the available records.   He was seen in the ER at [facility] on December 9 with what was ultimately described as seizure-like activity.  No prior history of seizures documented.  Head CT showed no acute findings.  ECG reported to show normal sinus rhythm.  UDS was negative.  High-sensitivity troponin I was normal.  And was started on antiepileptic medication.  He is here today with his wife.  He tells me that he went to the bathroom before going to bed, was sitting on the commode, no straining, no prodrome.  The next thing he remembers was being in the ambulance in transport to the hospital.  His wife states that she heard him make some type of vocalization and then went in to find him on the floor between the commode and the wall with rhythmic motion of his head and neck as well as arms.  She says that his eyes were open and he was not responding.  Even after ultimately coming to later under the care of EMS, he was confused and disoriented.  Blood sugar was reportedly somewhat low.  He had apparently bitten his tongue and states that he still has paresthesia of his lower lip.  No other focal motor weakness or headaches.  He states that at baseline he does not report any orthostatic dizziness, no palpitations or chest pain.  He has never had any similar event.  He is scheduled for formal neurology consultation.  I personally reviewed his ECG today which shows normal sinus rhythm, normal intervals, no Brugada pattern or delta waves.  There is no family history of sudden cardiac  death.  I reviewed his medications which are noted below.  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  Past Medical History:  Diagnosis Date   Anxiety    Arthritis    Depression    GERD (gastroesophageal reflux disease)    History of kidney stones    Sleep apnea    wears CPAP     Past Surgical History:  Procedure Laterality Date   APPENDECTOMY     CERVICAL SPINE SURGERY  2018   CHOLECYSTECTOMY     CYST EXCISION     CYSTOSCOPY     CYSTOSCOPY WITH RETROGRADE PYELOGRAM, URETEROSCOPY AND STENT PLACEMENT Right 08/08/2020   Procedure: CYSTOSCOPY WITH RIGHT RETROGRADE PYELOGRAM AND RIGHT URETEROSCOPY;  Surgeon: Cleon Gustin, MD;  Location: [facility];  Service: Urology;  Laterality: Right;   EXTRACORPOREAL SHOCK WAVE LITHOTRIPSY Right 05/21/2020   Procedure: EXTRACORPOREAL SHOCK WAVE LITHOTRIPSY (ESWL);  Surgeon: Cleon Gustin, MD;  Location: [facility];  Service: Urology;  Laterality: Right;   GASTRIC BYPASS OPEN  2003   INCISIONAL HERNIA REPAIR     kidney stone removal     KNEE ARTHROSCOPY Left    multiple   NECK SURGERY     REPLACEMENT TOTAL KNEE Right    RHINOPLASTY     STONE EXTRACTION WITH BASKET Right 08/08/2020   Procedure: STONE EXTRACTION WITH BASKET;  Surgeon: Cleon Gustin, MD;  Location: [facility];  Service: Urology;  Laterality: Right;    Current Outpatient Medications  Medication Sig Dispense Refill   busPIRone (BUSPAR) 10 MG tablet Take 1 tablet by mouth 4 (four) times daily as needed.     Cholecalciferol (VITAMIN D3 PO) Take 1 tablet by mouth daily.     Cyanocobalamin (VITAMIN B12 PO) Take 1 tablet by mouth daily.     divalproex (DEPAKOTE) 250 MG DR tablet Take 250 mg by mouth 2 (two) times daily.     Ferrous Sulfate (IRON PO) Take 1 tablet by mouth daily.     Multiple Vitamin (MULTIVITAMIN PO) Take 1 packet by mouth in the morning and at bedtime.     omeprazole (PRILOSEC) 20 MG capsule Take 20 mg by mouth in the morning.     PARoxetine  (PAXIL) 20 MG tablet Take 20 mg by mouth in the morning.     QUEtiapine (SEROQUEL) 300 MG tablet Take 300 mg by mouth at bedtime.     Thiamine HCl (VITAMIN B1 PO) Take 1 tablet by mouth daily.     zolpidem (AMBIEN) 10 MG tablet Take 10 mg by mouth at bedtime.     No current facility-administered medications for this visit.   Allergies:  Codeine   Social History: The patient  reports that he quit smoking about 24 years ago. His smoking use included cigarettes. He has a 5.00 pack-year smoking history. He has never used smokeless tobacco. He reports that he does not drink alcohol and does not use drugs.   Family History: The patient's family history includes Kidney failure in his father.   ROS: No palpitations, no orthopnea or PND.  Physical Exam: VS:  BP 128/82    Pulse 73    Ht 5\' 11"  (1.803 m)    Wt 278 lb 6.4 oz (126.3 kg)    SpO2 94%    BMI 38.83 kg/m , BMI Body mass index is 38.83 kg/m.  Wt Readings from Last 3 Encounters:  03/11/21 278 lb 6.4 oz (126.3 kg)  08/06/20 265 lb (120.2 kg)  06/26/20 250 lb (113.4 kg)    General: Patient appears comfortable at rest. HEENT: Conjunctiva and lids normal, wearing a mask. Neck: Supple, no elevated JVP or carotid bruits, no thyromegaly. Lungs: Clear to auscultation, nonlabored breathing at rest. Cardiac: Regular rate and rhythm, no S3 or significant systolic murmur, no pericardial rub. Abdomen: Soft, nontender, bowel sounds present. Extremities: No pitting edema, distal pulses 2+. Skin: Warm and dry. Musculoskeletal: No kyphosis. Neuropsychiatric: Alert and oriented x3, affect grossly appropriate.  ECG:  An ECG dated 08/06/2020 was personally reviewed today and demonstrated:  Sinus rhythm with nonspecific T wave changes.  Recent Labwork: 08/06/2020: BUN 17; Creatinine, Ser 0.92; Hemoglobin 14.3; Platelets 128; Potassium 4.0; Sodium 141  December 2022: UDS negative, random glucose 167, high-sensitivity troponin I 8, hemoglobin 14.1,  platelets 123, potassium 3.6, BUN 18, creatinine 1.19, AST 17, ALT 23, TSH 7.29  Other Studies Reviewed Today:  Noncontrasted head CT 03/01/2021: FINDINGS:  Brain: The ventricles and sulci are appropriate size for the  patient's age. The gray-white matter discrimination is preserved.  There is no acute intracranial hemorrhage. No mass effect or midline  shift. No extra-axial fluid collection.   Vascular: No hyperdense vessel or unexpected calcification.   Skull: Normal. Negative for fracture or focal lesion.   Sinuses/Orbits: No acute finding.   Other: None   IMPRESSION:  Unremarkable noncontrast CT of the brain.   Assessment and Plan:  1.  Episode as  outlined above with description suggestive of new onset seizure, although frank syncope with subsequent secondary seizure-like activity is also possible.  There was no obvious precipitant or prodrome however.  His baseline ECG is normal, no family history of sudden cardiac death.  Noncontrasted head CT after the event was unremarkable.  He has no history of orthostatic dizziness or prior cardiac arrhythmia.  He was placed on antiepileptic medication and scheduled for formal neurology consultation already.  From a cardiac perspective we will plan a baseline echocardiogram to ensure structurally normal heart, also place a 14-day Zio patch to exclude any potential arrhythmias that may need an further evaluation.  2.  OSA on CPAP.  Medication Adjustments/Labs and Tests Ordered: Current medicines are reviewed at length with the patient today.  Concerns regarding medicines are outlined above.   Tests Ordered: Orders Placed This Encounter  Procedures   ECHOCARDIOGRAM COMPLETE    Medication Changes: No orders of the defined types were placed in this encounter.   Disposition:  Follow up  test results.  Signed, Satira Sark, MD, [facility] 03/11/2021 12:10 PM    [facility] at [place],  [place], [place] [address] Phone: [phone]; Fax: [phone]

## 2021-03-11 ENCOUNTER — Encounter: Payer: Self-pay | Admitting: Cardiology

## 2021-03-11 ENCOUNTER — Ambulatory Visit (INDEPENDENT_AMBULATORY_CARE_PROVIDER_SITE_OTHER): Payer: Federal, State, Local not specified - PPO | Admitting: Cardiology

## 2021-03-11 ENCOUNTER — Ambulatory Visit (INDEPENDENT_AMBULATORY_CARE_PROVIDER_SITE_OTHER): Payer: Federal, State, Local not specified - PPO

## 2021-03-11 ENCOUNTER — Other Ambulatory Visit: Payer: Self-pay

## 2021-03-11 ENCOUNTER — Other Ambulatory Visit: Payer: Self-pay | Admitting: Cardiology

## 2021-03-11 VITALS — BP 128/82 | HR 73 | Ht 71.0 in | Wt 278.4 lb

## 2021-03-11 DIAGNOSIS — R55 Syncope and collapse: Secondary | ICD-10-CM

## 2021-03-11 DIAGNOSIS — G4733 Obstructive sleep apnea (adult) (pediatric): Secondary | ICD-10-CM

## 2021-03-11 DIAGNOSIS — Z9989 Dependence on other enabling machines and devices: Secondary | ICD-10-CM | POA: Diagnosis not present

## 2021-03-11 DIAGNOSIS — Z87898 Personal history of other specified conditions: Secondary | ICD-10-CM

## 2021-03-11 NOTE — Addendum Note (Signed)
Addended by: Laurine Blazer on: 03/11/2021 02:55 PM   Modules accepted: Orders

## 2021-03-11 NOTE — Patient Instructions (Addendum)
Medication Instructions:  Your physician recommends that you continue on your current medications as directed. Please refer to the Current Medication list given to you today.  Labwork: none  Testing/Procedures: Your physician has requested that you have an echocardiogram. Echocardiography is a painless test that uses sound waves to create images of your heart. It provides your doctor with information about the size and shape of your heart and how well your hearts chambers and valves are working. This procedure takes approximately one hour. There are no restrictions for this procedure. ZIO- Long Term Monitor Instructions   Your physician has requested you wear your ZIO patch monitor 14 days.   This is a single patch monitor.  [employer] supplies one patch monitor per enrollment.  Additional stickers are not available.   Please do not apply patch if you will be having a Nuclear Stress Test, Echocardiogram, Cardiac CT, MRI, or Chest Xray during the time frame you would be wearing the monitor. The patch cannot be worn during these tests.  You cannot remove and re-apply the ZIO AT patch monitor.   Your ZIO patch monitor will be sent USPS Priority mail from [facility] directly to your home address. The monitor may also be mailed to a PO BOX if home delivery is not available.   It may take 3-5 days to receive your monitor after you have been enrolled.   Once you have received you monitor, please review enclosed instructions.  Your monitor has already been registered assigning a specific monitor serial # to you.   Applying the monitor   Shave hair from upper left chest.   Hold abrader disc by orange tab.  Rub abrader in 40 strokes over left upper chest as indicated in your monitor instructions.   Clean area with 4 enclosed alcohol pads .  Use all pads to assure are is cleaned thoroughly.  Let dry.   Apply patch as indicated in monitor instructions.  Patch will be place under collarbone  on left side of chest with arrow pointing upward.   Rub patch adhesive wings for 2 minutes.Remove white label marked "1".  Remove white label marked "2".  Rub patch adhesive wings for 2 additional minutes.   While looking in a mirror, press and release button in center of patch.  A small green light will flash 3-4 times .  This will be your only indicator the monitor has been turned on.     Do not shower for the first 48 hours.  You may shower after the first 48 hours.   Press button if you feel a symptom. You will hear a small click.  Record Date, Time and Symptom in the Patient Log Book.   When you are ready to remove patch, follow instructions on last 2 pages of Patient Log Book.  Stick patch monitor onto last page of Patient Log Book.   Place Patient Log Book in [place] box.  Use locking tab on box and tape box closed securely.  The [employer] and [employer] has [employer] on it.  Please place in mailbox as soon as possible.  Your physician should have your test results approximately 7 days after the monitor has been mailed back to [facility].   Call [place] at [phone] if you have questions regarding your ZIO AT patch monitor.  Call them immediately if you see an orange light blinking on your monitor.   If your monitor falls off in less than 4 days contact our  Monitor department at [phone].  If your monitor becomes loose or falls off after 4 days call [employer] at [phone] for suggestions on securing your monitor.  Follow-Up: Your physician recommends that you schedule a follow-up appointment in: pending  Any Other Special Instructions Will Be Listed Below (If Applicable).  If you need a refill on your cardiac medications before your next appointment, please call your pharmacy.

## 2021-03-13 ENCOUNTER — Ambulatory Visit (HOSPITAL_COMMUNITY)
Admission: RE | Admit: 2021-03-13 | Discharge: 2021-03-13 | Disposition: A | Discharge status: Home / Self Care | Payer: Federal, State, Local not specified - PPO | Source: Ambulatory Visit | Attending: Cardiology | Admitting: Cardiology

## 2021-03-13 ENCOUNTER — Telehealth: Payer: Self-pay | Admitting: *Deleted

## 2021-03-13 DIAGNOSIS — R55 Syncope and collapse: Secondary | ICD-10-CM | POA: Diagnosis not present

## 2021-03-13 DIAGNOSIS — Z87898 Personal history of other specified conditions: Secondary | ICD-10-CM | POA: Insufficient documentation

## 2021-03-13 LAB — ECHOCARDIOGRAM COMPLETE
AR max vel: 2.21 cm2
AV Area VTI: 2.56 cm2
AV Area mean vel: 2.37 cm2
AV Mean grad: 7 mmHg
AV Peak grad: 13.2 mmHg
Ao pk vel: 1.82 m/s
Area-P 1/2: 2.62 cm2
Calc EF: 61.9 %
MV VTI: 2.92 cm2
S' Lateral: 3 cm
Single Plane A2C EF: 63.2 %
Single Plane A4C EF: 60.5 %

## 2021-03-13 NOTE — Progress Notes (Signed)
*  PRELIMINARY RESULTS* Echocardiogram 2D Echocardiogram has been performed.  William Farley 03/13/2021, 12:14 PM

## 2021-03-13 NOTE — Telephone Encounter (Signed)
Patient informed. Copy sent to PCP °

## 2021-03-13 NOTE — Telephone Encounter (Signed)
-----   Message from Satira Sark, MD sent at 03/13/2021  3:50 PM EST ----- Results reviewed.  Echocardiogram shows vigorous LVEF at 65 to 70%, RV contraction also normal, and no significant valvular abnormalities.  This is reassuring.  Continue with plans for cardiac monitor

## 2021-03-21 DIAGNOSIS — Z13220 Encounter for screening for lipoid disorders: Secondary | ICD-10-CM | POA: Diagnosis not present

## 2021-03-21 DIAGNOSIS — Z79899 Other long term (current) drug therapy: Secondary | ICD-10-CM | POA: Diagnosis not present

## 2021-03-21 DIAGNOSIS — R7989 Other specified abnormal findings of blood chemistry: Secondary | ICD-10-CM | POA: Diagnosis not present

## 2021-03-21 DIAGNOSIS — Z136 Encounter for screening for cardiovascular disorders: Secondary | ICD-10-CM | POA: Diagnosis not present

## 2021-03-21 DIAGNOSIS — R3129 Other microscopic hematuria: Secondary | ICD-10-CM | POA: Diagnosis not present

## 2021-03-21 DIAGNOSIS — R569 Unspecified convulsions: Secondary | ICD-10-CM | POA: Diagnosis not present

## 2021-04-02 ENCOUNTER — Telehealth: Payer: Self-pay | Admitting: Cardiology

## 2021-04-02 NOTE — Telephone Encounter (Signed)
[employer] representative was calling to report additional findings on the patient's monitor. Please call and use reference [phone]

## 2021-04-02 NOTE — Telephone Encounter (Signed)
Patient notified and verbalized understanding.   OV scheduled for Monday, 04/21/2021 at 2:20 with Dr. Audie Box in [place] office.

## 2021-04-02 NOTE — Telephone Encounter (Signed)
Call placed to [facility] - reporting episode of AFib - average HR 151 - reporting this as a delayed transmission that was dated 03/15/2021 at 7:18pm.  End of service not available yet on Zio site.

## 2021-04-03 ENCOUNTER — Telehealth: Payer: Self-pay | Admitting: *Deleted

## 2021-04-03 NOTE — Telephone Encounter (Signed)
Patient informed and verbalized understanding of plan. Copy sent to PCP 

## 2021-04-03 NOTE — Telephone Encounter (Signed)
[employer] is calling back with more finding for the patient. Please advise

## 2021-04-03 NOTE — Telephone Encounter (Signed)
-----   Message from Satira Sark, MD sent at 04/03/2021  1:41 PM EST ----- Results reviewed.  Please see initial consultation note.  Cardiac monitor shows multiple brief episodes of SVT which would not be expected to be associated with syncope.  He did have a sustained episode of atrial fibrillation with RVR on December 24 with heart rate in the 150s although atrial fibrillation represented less than 1% of his total rhythm burden during monitoring.  Still not certain that this explains his initial presentation.  My understanding is that he was to have formal neurology evaluation to exclude seizures or other process.  I see that he has office follow-up arranged, would keep this for further discussion, although at this point hesitant to start anticoagulation without further information.

## 2021-04-04 NOTE — Telephone Encounter (Signed)
I spoke with William Farley at [facility] and she repeated the interpretation Dr.McDowell had read yesterday.She was not aware he had seen this.

## 2021-04-04 NOTE — Telephone Encounter (Signed)
Orion Crook with [employer] is calling to report additional findings from patient's monitor results. Her call may be returned at [phone].

## 2021-04-20 NOTE — Progress Notes (Signed)
Cardiology Office Note:   Date:  04/21/2021  NAME:  [name]    MRN: [id number] DOB:  [birth year]   PCP:  Garey Ham, NP  Cardiologist:  Rozann Lesches, MD  Electrophysiologist:  None   Referring MD: Garey Ham, NP   Chief Complaint  Patient presents with   Follow-up    History of Present Illness:   William Farley is a 61 y.o. male with a hx of seizure disorder, pAF who presents for follow-up. Evaluated for syncopal episode attributed to seizure. Echo normal. Monitor with pAF <1% burden. He suffered a syncopal episode in December 2022.  Apparently he got up to go to the bathroom and passed out.  His wife reports seizure-like activity and he defecated on himself.  Work-up at [facility] was negative.  EKG was negative.  Troponins were negative.  He reports he underwent CT scans of his brain as well as MRI that were normal.  He also wore an EEG recently and was normal.  He was told by neurology in [place] that this was a cardiac event.  He did complete an echocardiogram that shows normal LV function.  He did wear a heart monitor which shows rapid A. fib.  He had less than 1% burden.  He informs me he does not feel any A. fib.  He reports no rapid heartbeat sensation.  Has had no further syncopal events.  He describes no chest pain or trouble breathing.  He had no chest pain or trouble breathing prior to syncopal episode.  He is very low risk from an A. fib standpoint.  CHA2DS2-VASc score equals 0.  He has never had a heart attack or stroke.  Family history is significant for heart disease.  He is not diabetic.  No new medications.  Recent TSH is within limits.  Really unclear what caused his syncopal event.  Has had no further recurrences.  I do not believe A. fib explains his passing out.  He does report some shortness of breath.  He believes this could be anxiety.  This can occur with activity.  We discussed pursuing coronary CTA.  He is interested in doing  this.  Problem List Seizure disorder? Paroxysmal Afib -<1% burden  -CHADSVASC=0 3. OSA 4. Obesity  -BMI 37  Past Medical History: Past Medical History:  Diagnosis Date   Anxiety    Arthritis    Depression    GERD (gastroesophageal reflux disease)    History of kidney stones    Sleep apnea    wears CPAP     Past Surgical History: Past Surgical History:  Procedure Laterality Date   APPENDECTOMY     CERVICAL SPINE SURGERY  2018   CHOLECYSTECTOMY     CYST EXCISION     CYSTOSCOPY     CYSTOSCOPY WITH RETROGRADE PYELOGRAM, URETEROSCOPY AND STENT PLACEMENT Right 08/08/2020   Procedure: CYSTOSCOPY WITH RIGHT RETROGRADE PYELOGRAM AND RIGHT URETEROSCOPY;  Surgeon: Cleon Gustin, MD;  Location: [facility];  Service: Urology;  Laterality: Right;   EXTRACORPOREAL SHOCK WAVE LITHOTRIPSY Right 05/21/2020   Procedure: EXTRACORPOREAL SHOCK WAVE LITHOTRIPSY (ESWL);  Surgeon: Cleon Gustin, MD;  Location: [facility];  Service: Urology;  Laterality: Right;   GASTRIC BYPASS OPEN  2003   INCISIONAL HERNIA REPAIR     kidney stone removal     KNEE ARTHROSCOPY Left    multiple   NECK SURGERY     REPLACEMENT TOTAL KNEE Right    RHINOPLASTY  STONE EXTRACTION WITH BASKET Right 08/08/2020   Procedure: STONE EXTRACTION WITH BASKET;  Surgeon: Cleon Gustin, MD;  Location: [facility];  Service: Urology;  Laterality: Right;    Current Medications: Current Meds  Medication Sig   Cholecalciferol (VITAMIN D3 PO) Take 1 tablet by mouth daily.   Cyanocobalamin (VITAMIN B12 PO) Take 1 tablet by mouth 3 (three) times a week.   divalproex (DEPAKOTE) 500 MG DR tablet Take 500 mg by mouth 2 (two) times daily.   Ferrous Sulfate (IRON PO) Take 1 tablet by mouth daily.   LORazepam (ATIVAN) 1 MG tablet Take 1 tablet by mouth 3 (three) times daily as needed.   metoprolol succinate (TOPROL XL) 25 MG 24 hr tablet Take 1 tablet (25 mg total) by mouth daily.   MISC NATURAL PRODUCTS PO Take 1 tablet by mouth  3 (three) times daily. Golo Release Dietary Supplement   omeprazole (PRILOSEC) 20 MG capsule Take 20 mg by mouth in the morning.   PARoxetine (PAXIL) 20 MG tablet Take 30 mg by mouth in the morning.   QUEtiapine (SEROQUEL) 300 MG tablet Take 300 mg by mouth at bedtime.   Thiamine HCl (VITAMIN B1 PO) Take 1 tablet by mouth daily.   zolpidem (AMBIEN) 10 MG tablet Take 10 mg by mouth at bedtime.     Allergies:    Codeine   Social History: Social History   Socioeconomic History   Marital status: Married    Spouse name: Not on file   Number of children: Not on file   Years of education: Not on file   Highest education level: Not on file  Occupational History   Occupation: Environmental manager  Tobacco Use   Smoking status: Former    Packs/day: 0.50    Years: 10.00    Pack years: 5.00    Types: Cigarettes    Quit date: 05/20/1996    Years since quitting: 24.9   Smokeless tobacco: Never  Substance and Sexual Activity   Alcohol use: Never   Drug use: Never   Sexual activity: Not on file  Other Topics Concern   Not on file  Social History Narrative   Not on file   Social Determinants of Health   Financial Resource Strain: Not on file  Food Insecurity: Not on file  Transportation Needs: Not on file  Physical Activity: Not on file  Stress: Not on file  Social Connections: Not on file     Family History: The patient's family history includes Heart disease (age of onset: 26) in his father; Kidney failure in his father.  ROS:   All other ROS reviewed and negative. Pertinent positives noted in the HPI.     EKGs/Labs/Other Studies Reviewed:   The following studies were personally reviewed by me today:  TTE 03/13/2021  1. Left ventricular ejection fraction, by estimation, is 65 to 70%. The  left ventricle has normal function. The left ventricle has no regional  wall motion abnormalities. The left ventricular internal cavity size was  mildly dilated. Left ventricular   diastolic parameters were normal.   2. Right ventricular systolic function is normal. The right ventricular  size is normal. There is normal pulmonary artery systolic pressure.   3. The mitral valve is normal in structure. Trivial mitral valve  regurgitation.   4. The aortic valve is tricuspid. Aortic valve regurgitation is not  visualized.   Recent Labs: 08/06/2020: BUN 17; Creatinine, Ser 0.92; Hemoglobin 14.3; Platelets 128; Potassium 4.0; Sodium 141  Recent Lipid Panel No results found for: CHOL, TRIG, HDL, CHOLHDL, VLDL, LDLCALC, LDLDIRECT  Physical Exam:   VS:  BP 118/70    Pulse 72    Ht 5\' 11"  (1.803 m)    Wt 266 lb 9.6 oz (120.9 kg)    SpO2 95%    BMI 37.18 kg/m    Wt Readings from Last 3 Encounters:  04/21/21 266 lb 9.6 oz (120.9 kg)  03/11/21 278 lb 6.4 oz (126.3 kg)  08/06/20 265 lb (120.2 kg)    General: Well nourished, well developed, in no acute distress Head: Atraumatic, normal size  Eyes: PEERLA, EOMI  Neck: Supple, no JVD Endocrine: No thryomegaly Cardiac: Normal S1, S2; RRR; no murmurs, rubs, or gallops Lungs: Clear to auscultation bilaterally, no wheezing, rhonchi or rales  Abd: Soft, nontender, no hepatomegaly  Ext: No edema, pulses 2+ Musculoskeletal: No deformities, BUE and BLE strength normal and equal Skin: Warm and dry, no rashes   Neuro: Alert and oriented to person, place, time, and situation, CNII-XII grossly intact, no focal deficits  Psych: Normal mood and affect   ASSESSMENT:   TARAY NORMOYLE is a 61 y.o. male who presents for the following: 1. Paroxysmal atrial fibrillation (HCC)   2. SOB (shortness of breath)   3. Pre-op testing   4. Syncope and collapse     PLAN:   1. Paroxysmal atrial fibrillation (HCC) -Paroxysmal atrial fibrillation seen on monitor.  EKG demonstrates normal sinus rhythm with normal intervals.  Burden is less than 1%. -CHA2DS2-VASc score equals 0.  Anticoagulation is indicated. -Given his low burden I  recommended he start metoprolol succinate 25 mg daily.  This will prevent any rapid episodes.  He reports he does not feel any episodes. -Suspect his trigger for A. fib is sleep apnea.  He also is obese.  He will work on losing weight.  His blood pressure is well controlled.  Overall he is very low risk without any stroke risk factors. -For now we will continue with a conservative approach.  We will see him back in 6 months.  We discussed cardia mobile to monitor his rhythm.  2. SOB (shortness of breath) -Intermittent shortness of breath.  Could be anxiety.  Given syncopal episode I recommended coronary CTA.  He will take metoprolol succinate 25 mg before the scan.  Heart rate low 70s today.  This should be enough.  3. Pre-op testing -BMP prior to coronary CTA.  4. Syncope and collapse -Syncopal episode in December 2022.  Describes shaking and convulsing per his wife.  He reports he defecated on himself.  Work-up at [facility] really unremarkable.  Echocardiogram here normal.  Recent monitor with paroxysmal atrial fibrillation. -I do not believe cardiac etiology explains his episode.  He apparently was cleared by neurology and can come off seizure medications.  Really unclear what triggered this. -We will exclude CAD with coronary CTA.  His EKG shows normal sinus rhythm with normal intervals.  Cardiovascular exam is normal.  Echo is normal.  Everything cardiac really is unremarkable. -Given prodrome of chest pain palpitations I do not believe this episode was cardiac in nature.  He describes symptoms classic for seizure.  Also describes postictal symptoms.  Disposition: Return in about 6 months (around 10/19/2021).  Medication Adjustments/Labs and Tests Ordered: Current medicines are reviewed at length with the patient today.  Concerns regarding medicines are outlined above.  Orders Placed This Encounter  Procedures   CT CORONARY MORPH W/CTA COR W/SCORE W/CA W/CM &/  OR WO/CM   Basic  metabolic panel   Meds ordered this encounter  Medications   metoprolol succinate (TOPROL XL) 25 MG 24 hr tablet    Sig: Take 1 tablet (25 mg total) by mouth daily.    Dispense:  90 tablet    Refill:  2    Patient Instructions  Medication Instructions:  START Toprol XL 25 mg daily   Take your Metoprolol 2 hours before your Cardiac CT   Labwork: BMET today  Testing/Procedures: Your physician has requested that you have cardiac CT. Cardiac computed tomography (CT) is a painless test that uses an x-ray machine to take clear, detailed pictures of your heart. For further information please visit [facility]uge[facility]iest[facility]tn. Please follow instruction sheet as given.    Follow-Up: 6 months with Dr.McDowell  Any Other Special Instructions Will Be Listed Below (If Applicable).  If you need a refill on your cardiac medications before your next appointment, please call your pharmacy.    Time Spent with Patient: I have spent a total of 35 minutes with patient reviewing hospital notes, telemetry, EKGs, labs and examining the patient as well as establishing an assessment and plan that was discussed with the patient.  > 50% of time was spent in direct patient care.  Signed, Addison Naegeli. Audie Box, MD, [place]  [address] [place],  [address] [phone]  04/21/2021 3:08 PM

## 2021-04-21 ENCOUNTER — Ambulatory Visit (INDEPENDENT_AMBULATORY_CARE_PROVIDER_SITE_OTHER): Payer: Federal, State, Local not specified - PPO | Admitting: Cardiovascular Disease

## 2021-04-21 ENCOUNTER — Other Ambulatory Visit: Payer: Self-pay

## 2021-04-21 ENCOUNTER — Encounter: Payer: Self-pay | Admitting: Cardiovascular Disease

## 2021-04-21 ENCOUNTER — Other Ambulatory Visit (HOSPITAL_COMMUNITY)
Admission: RE | Admit: 2021-04-21 | Discharge: 2021-04-21 | Disposition: A | Discharge status: Home / Self Care | Payer: No Typology Code available for payment source | Source: Ambulatory Visit | Attending: Cardiovascular Disease | Admitting: Cardiovascular Disease

## 2021-04-21 VITALS — BP 118/70 | HR 72 | Ht 71.0 in | Wt 266.6 lb

## 2021-04-21 DIAGNOSIS — R0602 Shortness of breath: Secondary | ICD-10-CM

## 2021-04-21 DIAGNOSIS — Z01818 Encounter for other preprocedural examination: Secondary | ICD-10-CM

## 2021-04-21 DIAGNOSIS — I480 Paroxysmal atrial fibrillation: Secondary | ICD-10-CM | POA: Diagnosis not present

## 2021-04-21 DIAGNOSIS — R55 Syncope and collapse: Secondary | ICD-10-CM

## 2021-04-21 LAB — BASIC METABOLIC PANEL
Anion gap: 5 (ref 5–15)
BUN: 16 mg/dL (ref 6–20)
CO2: 28 mmol/L (ref 22–32)
Calcium: 8.4 mg/dL — ABNORMAL LOW (ref 8.9–10.3)
Chloride: 107 mmol/L (ref 98–111)
Creatinine, Ser: 1 mg/dL (ref 0.61–1.24)
GFR, Estimated: >60 mL/min (ref >60)
Glucose, Bld: 96 mg/dL (ref 70–99)
Potassium: 4.8 mmol/L (ref 3.5–5.1)
Sodium: 140 mmol/L (ref 135–145)

## 2021-04-21 MED ORDER — METOPROLOL SUCCINATE ER 25 MG PO TB24: 25.0000 mg | ORAL_TABLET | Freq: Every day | ORAL | 2 refills | Status: DC

## 2021-04-21 NOTE — Patient Instructions (Addendum)
Medication Instructions:  START Toprol XL 25 mg daily   Take your Metoprolol 2 hours before your Cardiac CT   Labwork: BMET today  Testing/Procedures: Your physician has requested that you have cardiac CT. Cardiac computed tomography (CT) is a painless test that uses an x-ray machine to take clear, detailed pictures of your heart. For further information please visit [facility]tn. Please follow instruction sheet as given.    Follow-Up: 6 months with Dr.McDowell  Any Other Special Instructions Will Be Listed Below (If Applicable).  If you need a refill on your cardiac medications before your next appointment, please call your pharmacy.

## 2021-04-22 ENCOUNTER — Telehealth (INDEPENDENT_AMBULATORY_CARE_PROVIDER_SITE_OTHER): Payer: Self-pay | Admitting: *Deleted

## 2021-04-22 ENCOUNTER — Encounter (INDEPENDENT_AMBULATORY_CARE_PROVIDER_SITE_OTHER): Payer: Self-pay | Admitting: *Deleted

## 2021-04-22 DIAGNOSIS — Z6839 Body mass index (BMI) 39.0-39.9, adult: Secondary | ICD-10-CM | POA: Diagnosis not present

## 2021-04-22 DIAGNOSIS — R569 Unspecified convulsions: Secondary | ICD-10-CM | POA: Diagnosis not present

## 2021-04-22 DIAGNOSIS — I480 Paroxysmal atrial fibrillation: Secondary | ICD-10-CM | POA: Diagnosis not present

## 2021-04-22 NOTE — Telephone Encounter (Signed)
Referring MD/PCP: [facility]  Procedure: tcs  Reason/Indication:  screening  Has patient had this procedure before?  Yes, patient couldn't remember when  If so, when, by whom and where?    Is there a family history of colon cancer?  no  Who?  What age when diagnosed?    Is patient diabetic? If yes, Type 1 or Type 2   no      Does patient have prosthetic heart valve or mechanical valve?  no  Do you have a pacemaker/defibrillator?  no  Has patient ever had endocarditis/atrial fibrillation? Yes, a fib  Does patient use oxygen? no  Has patient had joint replacement within last 12 months?  no  Is patient constipated or do they take laxatives? no  Does patient have a history of alcohol/drug use?  no  Have you had a stroke/heart attack last 6 mths? no  Do you take medicine for weight loss?  no  For male patients,: have you had a hysterectomy                       are you post menopausal                       do you still have your menstrual cycle   Is patient on blood thinner such as Coumadin, Plavix and/or Aspirin? no  Medications: metoprolol 25 mg daily, zolpidem 10 mg daily, paroxetine 20 mg daily, quetiapine 300 mg daily, buspirone 10 mg qid prn, divalproex 250 mg 4 tabs daily, omeprazole 20 mg daily  Allergies: codeine  Medication Adjustment per Dr Rehman/Dr Jenetta Downer   Procedure date & time: 05/01/21

## 2021-04-23 ENCOUNTER — Other Ambulatory Visit (INDEPENDENT_AMBULATORY_CARE_PROVIDER_SITE_OTHER): Payer: Self-pay

## 2021-04-23 DIAGNOSIS — Z1211 Encounter for screening for malignant neoplasm of colon: Secondary | ICD-10-CM

## 2021-04-29 ENCOUNTER — Telehealth (HOSPITAL_COMMUNITY): Payer: Self-pay | Admitting: *Deleted

## 2021-04-29 NOTE — Telephone Encounter (Signed)
Reaching out to patient to offer assistance regarding upcoming cardiac imaging study; pt verbalizes understanding of appt date/time, parking situation and where to check in, pre-test NPO status, and verified current allergies; name and call back number provided for further questions should they arise  Gordy Clement RN Navigator Cardiac Imaging [facility] [phone] office [phone] cell  Patient to take his daily 25mg  metoprolol succinate two hours prior to his cardiac CT scan. He is aware to arrive at 2:45pm for his 3:15pm scan.

## 2021-04-30 ENCOUNTER — Ambulatory Visit (HOSPITAL_COMMUNITY)
Admission: RE | Admit: 2021-04-30 | Discharge: 2021-04-30 | Disposition: A | Discharge status: Home / Self Care | Payer: Federal, State, Local not specified - PPO | Source: Ambulatory Visit | Attending: Cardiovascular Disease | Admitting: Cardiovascular Disease

## 2021-04-30 ENCOUNTER — Encounter: Payer: Self-pay | Admitting: Neurology

## 2021-04-30 ENCOUNTER — Ambulatory Visit (INDEPENDENT_AMBULATORY_CARE_PROVIDER_SITE_OTHER): Payer: No Typology Code available for payment source | Admitting: Neurology

## 2021-04-30 ENCOUNTER — Other Ambulatory Visit: Payer: Self-pay

## 2021-04-30 VITALS — BP 118/77 | HR 55 | Ht 71.0 in | Wt 263.8 lb

## 2021-04-30 DIAGNOSIS — R569 Unspecified convulsions: Secondary | ICD-10-CM

## 2021-04-30 DIAGNOSIS — R0602 Shortness of breath: Secondary | ICD-10-CM | POA: Insufficient documentation

## 2021-04-30 DIAGNOSIS — R55 Syncope and collapse: Secondary | ICD-10-CM | POA: Diagnosis not present

## 2021-04-30 MED ORDER — NITROGLYCERIN 0.4 MG SL SUBL
0.8000 mg | SUBLINGUAL_TABLET | Freq: Once | SUBLINGUAL | Status: AC
  Administered 2021-04-30: 0.8 mg via SUBLINGUAL

## 2021-04-30 MED ORDER — NITROGLYCERIN 0.4 MG SL SUBL
SUBLINGUAL_TABLET | SUBLINGUAL | Status: AC
  Filled 2021-04-30: qty 2

## 2021-04-30 MED ORDER — IOHEXOL 350 MG/ML SOLN
95.0000 mL | Freq: Once | INTRAVENOUS | Status: AC | PRN
  Administered 2021-04-30: 95 mL via INTRAVENOUS

## 2021-04-30 NOTE — Progress Notes (Signed)
NEUROLOGY CONSULTATION NOTE  William Farley MRN: [id number] DOB: [birth year]  Referring provider: Dr. Maeola Sarah Primary care provider: Garey Ham, NP  Reason for consult:  second opinion before Depakote discontinuation  Dear Dr Ileana Roup:  Thank you for your kind referral of William Farley for consultation of the above symptoms. Although his history is well known to you, please allow me to reiterate it for the purpose of our medical record. The patient was accompanied to the clinic by his spouse William Farley who also provides collateral information. Records and images were personally reviewed where available.   HISTORY OF PRESENT ILLNESS: This is a 61 year old right-handed man with a history of OSA, SVT, depression, PTSD, presenting for evaluation of need for Depakote started after an episode of loss of consciousness with shaking on 02/28/2021. He was in his usual state of health until 02/28/21 when his wife heard a thud. She heard him make a noise and found him between the tub and toilet. He was staring with legs and arms jerking, he kept biting his lip and tongue. This lasted for several minutes, then he was confused after, unable to answer questions. His last recollection is getting ready for bed, he does not recall being in the bathroom, then came to in the ambulance. He had bitten his tongue/lip and had bowel incontinence. His wife reports BP was fine and glucose was a little low. He was brought to the [facility] ER where bloodwork was overall normal, glucose 167. TSH 7.291. UDS and EtOH negative. EKG and head CT without contrast were normal. Symptoms felt due to vasovagal syncope. On his follow-up with his PCP a few days later, he was started on Depakote for possible seizure. He was seen by a neurologist in [place] and reportedly had a normal EEG, report unavailable for review. He denies any prior history of seizures. He and his wife deny any other staring/unresponsive episodes, gaps  in time, olfactory/gustatory hallucinations, deja vu, rising epigastric sensation, focal numbness/tingling/weakness, myoclonic jerks. He gets dizzy when standing up quickly. No headaches, diplopia, dysarthria/dysphagia, neck/back pain, bowel/bladder dysfunction. He gets occasional stomach pains due to GERD. No recent medication changes. He gets 8-8.5 hours of sleep with his CPAP, no sleep deprivation. He has prn lorazepam usually taken once a day depending on stress level, this was started after the seizure. Prior to the he was on Buspar. Mood is pretty good, he takes Seroquel. 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 He had a normal birth and early development.  There is no history of febrile convulsions, CNS infections such as meningitis/encephalitis, significant traumatic brain injury, neurosurgical procedures, or family history of seizures.    PAST MEDICAL HISTORY: Past Medical History:  Diagnosis Date   Anxiety    Arthritis    Depression    GERD (gastroesophageal reflux disease)    History of kidney stones    Sleep apnea    wears CPAP     PAST SURGICAL HISTORY: Past Surgical History:  Procedure Laterality Date   APPENDECTOMY     CERVICAL SPINE SURGERY  2018   CHOLECYSTECTOMY     CYST EXCISION     CYSTOSCOPY     CYSTOSCOPY WITH RETROGRADE PYELOGRAM, URETEROSCOPY AND STENT PLACEMENT Right 08/08/2020   Procedure: CYSTOSCOPY WITH RIGHT RETROGRADE PYELOGRAM AND RIGHT URETEROSCOPY;  Surgeon: Cleon Gustin, MD;  Location: [facility];  Service: Urology;  Laterality: Right;   EXTRACORPOREAL SHOCK WAVE LITHOTRIPSY Right 05/21/2020   Procedure: EXTRACORPOREAL  SHOCK WAVE LITHOTRIPSY (ESWL);  Surgeon: Cleon Gustin, MD;  Location: [facility];  Service: Urology;  Laterality: Right;   GASTRIC BYPASS OPEN  2003   INCISIONAL HERNIA REPAIR     kidney stone removal     KNEE ARTHROSCOPY Left    multiple   NECK SURGERY     REPLACEMENT TOTAL KNEE Right    RHINOPLASTY      STONE EXTRACTION WITH BASKET Right 08/08/2020   Procedure: STONE EXTRACTION WITH BASKET;  Surgeon: Cleon Gustin, MD;  Location: [facility];  Service: Urology;  Laterality: Right;    MEDICATIONS: Current Outpatient Medications on File Prior to Visit  Medication Sig Dispense Refill   Cholecalciferol (VITAMIN D3) 125 MCG (5000 UT) TABS Take 5,000 Units by mouth in the morning.     Cyanocobalamin (VITAMIN B12 PO) Take 1 tablet by mouth every other day.     divalproex (DEPAKOTE) 500 MG DR tablet Take 500 mg by mouth 2 (two) times daily.     Ferrous Sulfate (IRON PO) Take 55 mg by mouth at bedtime.     LORazepam (ATIVAN) 1 MG tablet Take 1 mg by mouth 3 (three) times daily as needed for anxiety.     metoprolol succinate (TOPROL XL) 25 MG 24 hr tablet Take 1 tablet (25 mg total) by mouth daily. 90 tablet 2   MISC NATURAL PRODUCTS PO Take 1 tablet by mouth 3 (three) times daily. Golo Release Dietary Supplement     omeprazole (PRILOSEC) 20 MG capsule Take 20 mg by mouth in the morning.     PARoxetine (PAXIL) 30 MG tablet Take 30 mg by mouth in the morning.     QUEtiapine (SEROQUEL) 300 MG tablet Take 300 mg by mouth at bedtime.     zolpidem (AMBIEN) 10 MG tablet Take 10 mg by mouth at bedtime.     No current facility-administered medications on file prior to visit.    ALLERGIES: Allergies  Allergen Reactions   Codeine Itching, Rash and Other (See Comments)    FAMILY HISTORY: Family History  Problem Relation Age of Onset   Heart disease Father 80   Kidney failure Father     SOCIAL HISTORY: Social History   Socioeconomic History   Marital status: Married    Spouse name: Not on file   Number of children: Not on file   Years of education: Not on file   Highest education level: Not on file  Occupational History   Occupation: Environmental manager  Tobacco Use   Smoking status: Former    Packs/day: 0.50    Years: 10.00    Pack years: 5.00    Types: Cigarettes    Quit date:  05/20/1996    Years since quitting: 24.9   Smokeless tobacco: Never  Vaping Use   Vaping Use: Never used  Substance and Sexual Activity   Alcohol use: Never   Drug use: Never   Sexual activity: Not on file  Other Topics Concern   Not on file  Social History Narrative   Right handed    Married wife   Social Determinants of Health   Financial Resource Strain: Not on file  Food Insecurity: Not on file  Transportation Needs: Not on file  Physical Activity: Not on file  Stress: Not on file  Social Connections: Not on file  Intimate Partner Violence: Not on file     PHYSICAL EXAM: Vitals:   04/30/21 1243  BP: 118/77  Pulse: (!) 55  SpO2:  95%   General: No acute distress Head:  Normocephalic/atraumatic Skin/Extremities: No rash, no edema Neurological Exam: Mental status: alert and oriented to person, place, and time, no dysarthria or aphasia, Fund of knowledge is appropriate.  Recent and remote memory are intact, 3/3 delayed recall.  Attention and concentration are normal, 5/5 WORLD backwards. Cranial nerves: CN I: not tested CN II: pupils equal, round and reactive to light, visual fields intact CN III, IV, VI:  full range of motion, no nystagmus, no ptosis CN V: facial sensation intact CN VII: upper and lower face symmetric CN VIII: hearing intact to conversation Bulk & Tone: normal, no fasciculations. Motor: 5/5 throughout with no pronator drift. Sensation: intact to light touch, cold, pin, vibration sense.  No extinction to double simultaneous stimulation.  Romberg test negative Deep Tendon Reflexes: +2 throughout Cerebellar: no incoordination on finger to nose testing Gait: narrow-based and steady, able to tandem walk adequately. Tremor: none   IMPRESSION: This is a 61 year old right-handed man with a history of OSA, SVT, depression, PTSD, presenting for evaluation of need for Depakote started after an episode of loss of consciousness with shaking on 02/28/2021.  Etiology unclear, convulsive syncope versus seizure. His neurological exam is normal, no clear epilepsy risk factors. MRI brain with and without contrast will be ordered to assess for underlying structural abnormality. He reportedly had a normal routine EEG, records will be requested for review. We discussed doing a 24-hour EEG to assess for focal abnormalities that increase risk for recurrent seizures. We discussed that after an initial seizure, unless there are significant risk factors, an abnormal neurological exam, an EEG showing epileptiform abnormalities, and/or abnormal neuroimaging, treatment with an antiepileptic drug is typically not indicated. He is aware of [place] driving laws to stop driving after an episode of loss of consciousness until 6 months event-free. We discussed safety precautions as well involving his line of work. Continue Depakote 500mg  BID for now, follow-up after tests to discuss further management.   Thank you for allowing me to participate in the care of this patient. Please do not hesitate to call for any questions or concerns.   Ellouise Newer, M.D.  CC: Dr. Ileana Roup, William Ham, NP

## 2021-04-30 NOTE — Patient Instructions (Signed)
Good to meet you.  Schedule MRI brain with and without contrast  2. Schedule 24-hour EEG  3. EEG records from your prior neurologist will be requested for review  4. Continue Depakote 500mg  twice a day for now  5. Follow-up after tests, call for any changes   Seizure Precautions: 1. If medication has been prescribed for you to prevent seizures, take it exactly as directed.  Do not stop taking the medicine without talking to your doctor first, even if you have not had a seizure in a long time.   2. Avoid activities in which a seizure would cause danger to yourself or to others.  Don't operate dangerous machinery, swim alone, or climb in high or dangerous places, such as on ladders, roofs, or girders.  Do not drive unless your doctor says you may.  3. If you have any warning that you may have a seizure, lay down in a safe place where you can't hurt yourself.    4.  No driving for 6 months from last seizure, as per [facility].   Please refer to the following link on the [place] website for more information: http://[email or web address]/[email or web address]/drivingu.cfm   5.  Maintain good sleep hygiene. Avoid alcohol.  6.  Contact your doctor if you have any problems that may be related to the medicine you are taking.  7.  Call 911 and bring the patient back to the ED if:        A.  The seizure lasts longer than 5 minutes.       B.  The patient doesn't awaken shortly after the seizure  C.  The patient has new problems such as difficulty seeing, speaking or moving  D.  The patient was injured during the seizure  E.  The patient has a temperature over 102 F (39C)  F.  The patient vomited and now is having trouble breathing

## 2021-05-01 ENCOUNTER — Ambulatory Visit (HOSPITAL_COMMUNITY)
Admission: RE | Admit: 2021-05-01 | Discharge: 2021-05-01 | Disposition: A | Discharge status: Home / Self Care | Payer: No Typology Code available for payment source | Attending: Internal Medicine | Admitting: Internal Medicine

## 2021-05-01 ENCOUNTER — Ambulatory Visit (HOSPITAL_COMMUNITY): Payer: No Typology Code available for payment source | Admitting: Anesthesiology

## 2021-05-01 ENCOUNTER — Encounter (HOSPITAL_COMMUNITY): Payer: Self-pay | Admitting: Internal Medicine

## 2021-05-01 ENCOUNTER — Encounter (HOSPITAL_COMMUNITY)
Admission: RE | Disposition: A | Discharge status: Home / Self Care | Payer: Self-pay | Source: Home / Self Care | Attending: Internal Medicine

## 2021-05-01 ENCOUNTER — Other Ambulatory Visit: Payer: Self-pay

## 2021-05-01 ENCOUNTER — Ambulatory Visit (HOSPITAL_BASED_OUTPATIENT_CLINIC_OR_DEPARTMENT_OTHER): Payer: No Typology Code available for payment source | Admitting: Anesthesiology

## 2021-05-01 DIAGNOSIS — K648 Other hemorrhoids: Secondary | ICD-10-CM | POA: Insufficient documentation

## 2021-05-01 DIAGNOSIS — K644 Residual hemorrhoidal skin tags: Secondary | ICD-10-CM | POA: Insufficient documentation

## 2021-05-01 DIAGNOSIS — Z1211 Encounter for screening for malignant neoplasm of colon: Secondary | ICD-10-CM | POA: Insufficient documentation

## 2021-05-01 DIAGNOSIS — K635 Polyp of colon: Secondary | ICD-10-CM

## 2021-05-01 DIAGNOSIS — Z87891 Personal history of nicotine dependence: Secondary | ICD-10-CM | POA: Insufficient documentation

## 2021-05-01 DIAGNOSIS — K219 Gastro-esophageal reflux disease without esophagitis: Secondary | ICD-10-CM | POA: Insufficient documentation

## 2021-05-01 DIAGNOSIS — D120 Benign neoplasm of cecum: Secondary | ICD-10-CM | POA: Insufficient documentation

## 2021-05-01 DIAGNOSIS — Z139 Encounter for screening, unspecified: Secondary | ICD-10-CM | POA: Diagnosis not present

## 2021-05-01 DIAGNOSIS — K5730 Diverticulosis of large intestine without perforation or abscess without bleeding: Secondary | ICD-10-CM

## 2021-05-01 DIAGNOSIS — D127 Benign neoplasm of rectosigmoid junction: Secondary | ICD-10-CM | POA: Diagnosis not present

## 2021-05-01 HISTORY — PX: POLYPECTOMY: SHX5525

## 2021-05-01 HISTORY — PX: COLONOSCOPY WITH PROPOFOL: SHX5780

## 2021-05-01 HISTORY — PX: BIOPSY: SHX5522

## 2021-05-01 LAB — HM COLONOSCOPY

## 2021-05-01 SURGERY — COLONOSCOPY WITH PROPOFOL
Anesthesia: General

## 2021-05-01 MED ORDER — PROPOFOL 10 MG/ML IV BOLUS
INTRAVENOUS | Status: DC | PRN
  Administered 2021-05-01: 100 mg via INTRAVENOUS

## 2021-05-01 MED ORDER — PROPOFOL 500 MG/50ML IV EMUL
INTRAVENOUS | Status: DC | PRN
  Administered 2021-05-01: 200 ug/kg/min via INTRAVENOUS

## 2021-05-01 MED ORDER — EPHEDRINE SULFATE-NACL 50-0.9 MG/10ML-% IV SOSY
PREFILLED_SYRINGE | INTRAVENOUS | Status: DC | PRN
  Administered 2021-05-01 (×3): 5 mg via INTRAVENOUS

## 2021-05-01 MED ORDER — LACTATED RINGERS IV SOLN
INTRAVENOUS | Status: DC | PRN
Start: 2021-05-01 — End: 2021-05-01

## 2021-05-01 MED ORDER — EPHEDRINE 5 MG/ML INJ
INTRAVENOUS | Status: AC
  Filled 2021-05-01: qty 5

## 2021-05-01 MED ORDER — PHENYLEPHRINE HCL (PRESSORS) 10 MG/ML IV SOLN
INTRAVENOUS | Status: DC | PRN
  Administered 2021-05-01 (×3): 40 ug via INTRAVENOUS

## 2021-05-01 MED ORDER — GLYCOPYRROLATE PF 0.2 MG/ML IJ SOSY
PREFILLED_SYRINGE | INTRAMUSCULAR | Status: DC | PRN
  Administered 2021-05-01: .2 mg via INTRAVENOUS

## 2021-05-01 MED ORDER — PROPOFOL 500 MG/50ML IV EMUL
INTRAVENOUS | Status: AC
  Filled 2021-05-01: qty 50

## 2021-05-01 MED ORDER — GLYCOPYRROLATE PF 0.2 MG/ML IJ SOSY
PREFILLED_SYRINGE | INTRAMUSCULAR | Status: AC
  Filled 2021-05-01: qty 1

## 2021-05-01 MED ORDER — LACTATED RINGERS IV SOLN: INTRAVENOUS | Status: DC

## 2021-05-01 NOTE — Transfer of Care (Signed)
Immediate Anesthesia Transfer of Care Note  Patient: William Farley  Procedure(s) Performed: COLONOSCOPY WITH PROPOFOL POLYPECTOMY BIOPSY  Patient Location: PACU  Anesthesia Type:General  Level of Consciousness: awake, alert  and oriented  Airway & Oxygen Therapy: Patient Spontanous Breathing and Patient connected to nasal cannula oxygen  Post-op Assessment: Report given to RN, Post -op Vital signs reviewed and stable, Patient moving all extremities X 4 and Patient able to stick tongue midline  Post vital signs: Reviewed  Last Vitals:  Vitals Value Taken Time  BP 92/48   Temp 96.4   Pulse 50   Resp 15   SpO2 100     Last Pain:  Vitals:   05/01/21 1005  TempSrc:   PainSc: 0-No pain      Patients Stated Pain Goal: 6 (28/20/81 3887)  Complications: No notable events documented.

## 2021-05-01 NOTE — H&P (Signed)
William Farley is an 61 y.o. male.   Chief Complaint: Patient is here for colonoscopy. HPI: Patient is 61 year old Caucasian male who is here for screening colonoscopy.  He has had possibly 2 colonoscopies in the past but not the last several years.  He denies abdominal pain change in bowel habits or frank rectal bleeding.  He may occasionally notice blood in the tissue.  He has history of hemorrhoids.  His appetite is good.  He has gained few pounds over the last year. Family history is negative for CRC. He does not take aspirin NSAIDs or anticoagulants.  Past Medical History:  Diagnosis Date   Anxiety    Arthritis    Depression    GERD (gastroesophageal reflux disease)    History of kidney stones    Seizure ([place]) 02/2021   Sleep apnea    wears CPAP     Past Surgical History:  Procedure Laterality Date   APPENDECTOMY     CERVICAL SPINE SURGERY  2018   CHOLECYSTECTOMY     CYST EXCISION     CYSTOSCOPY     CYSTOSCOPY WITH RETROGRADE PYELOGRAM, URETEROSCOPY AND STENT PLACEMENT Right 08/08/2020   Procedure: CYSTOSCOPY WITH RIGHT RETROGRADE PYELOGRAM AND RIGHT URETEROSCOPY;  Surgeon: Cleon Gustin, MD;  Location: [facility];  Service: Urology;  Laterality: Right;   EXTRACORPOREAL SHOCK WAVE LITHOTRIPSY Right 05/21/2020   Procedure: EXTRACORPOREAL SHOCK WAVE LITHOTRIPSY (ESWL);  Surgeon: Cleon Gustin, MD;  Location: [facility];  Service: Urology;  Laterality: Right;   GASTRIC BYPASS OPEN  2003   INCISIONAL HERNIA REPAIR     kidney stone removal     KNEE ARTHROSCOPY Left    multiple   NECK SURGERY     REPLACEMENT TOTAL KNEE Right    RHINOPLASTY     STONE EXTRACTION WITH BASKET Right 08/08/2020   Procedure: STONE EXTRACTION WITH BASKET;  Surgeon: Cleon Gustin, MD;  Location: [facility];  Service: Urology;  Laterality: Right;    Family History  Problem Relation Age of Onset   Heart disease Father 75   Kidney failure Father    Colon cancer Neg Hx    Social History:  reports  that he quit smoking about 24 years ago. His smoking use included cigarettes. He has a 5.00 pack-year smoking history. He has never used smokeless tobacco. He reports that he does not drink alcohol and does not use drugs.  Allergies:  Allergies  Allergen Reactions   Codeine Itching, Rash and Other (See Comments)    Medications Prior to Admission  Medication Sig Dispense Refill   Cholecalciferol (VITAMIN D3) 125 MCG (5000 UT) TABS Take 5,000 Units by mouth in the morning.     Cyanocobalamin (VITAMIN B12 PO) Take 1 tablet by mouth every other day.     divalproex (DEPAKOTE) 500 MG DR tablet Take 500 mg by mouth 2 (two) times daily.     Ferrous Sulfate (IRON PO) Take 55 mg by mouth at bedtime.     LORazepam (ATIVAN) 1 MG tablet Take 1 mg by mouth 3 (three) times daily as needed for anxiety.     metoprolol succinate (TOPROL XL) 25 MG 24 hr tablet Take 1 tablet (25 mg total) by mouth daily. 90 tablet 2   MISC NATURAL PRODUCTS PO Take 1 tablet by mouth 3 (three) times daily. Golo Release Dietary Supplement     omeprazole (PRILOSEC) 20 MG capsule Take 20 mg by mouth in the morning.     PARoxetine (PAXIL) 30  MG tablet Take 30 mg by mouth in the morning.     QUEtiapine (SEROQUEL) 300 MG tablet Take 300 mg by mouth at bedtime.     zolpidem (AMBIEN) 10 MG tablet Take 10 mg by mouth at bedtime.      No results found for this or any previous visit (from the past 48 hour(s)). CT CORONARY MORPH W/CTA COR W/SCORE W/CA W/CM &/OR WO/CM  Addendum Date: 05/01/2021   ADDENDUM REPORT: 05/01/2021 08:27 EXAM: OVER-READ INTERPRETATION  CT CHEST The following report is an over-read performed by radiologist Dr. Eben Burow [facility] on 05/01/2021. This over-read does not include interpretation of cardiac or coronary anatomy or pathology. The coronary calcium score/coronary CTA interpretation by the cardiologist is attached. COMPARISON:  None. FINDINGS: No suspicious nodules, masses, or infiltrates are  identified in the visualized portion of the lungs. Mild scarring noted in both lung bases. No pleural fluid seen. Prior gastric bypass surgery is noted, with a small hiatal hernia. : Prior gastric bypass surgery, with small hiatal hernia noted. Electronically Signed   By: Marlaine Hind M.D.   On: 05/01/2021 08:27   Result Date: 05/01/2021 CLINICAL DATA:  50M with dyspnea, syncope EXAM: Cardiac/Coronary CTA TECHNIQUE: The patient was scanned on a [employer]. FINDINGS: A 100 kV prospective scan was triggered in the descending thoracic aorta at 111 HU's. Axial non-contrast 3 mm slices were carried out through the heart. The data set was analyzed on a dedicated work station and scored using the [place]. Gantry rotation speed was 250 msecs and collimation was .6 mm. 0.8 mg of sl NTG was given. The 3D data set was reconstructed in 5% intervals of the 35-75 % of the R-R cycle. Phases were analyzed on a dedicated work station using MPR, MIP and VRT modes. The patient received 80 cc of contrast. Coronary Arteries:  Normal coronary origin.  Right dominance. RCA is a large dominant artery that gives rise to PDA and PLA. There is no plaque. Left main is a large artery that gives rise to LAD and LCX arteries. LAD is a large vessel. Calcified plaque in proximal LAD causes 0-24% stenosis LCX is a non-dominant artery that gives rise to one large OM1 branch. There is no plaque. There is a ramus with no plaque Other findings: Left Ventricle: Normal size Left Atrium: Mild enlargement Pulmonary Veins: Normal configuration Right Ventricle: Mild dilatation Right Atrium: Mild enlargement Cardiac valves: No calcifications Thoracic aorta: Normal size Pulmonary Arteries: Normal size Systemic Veins: Normal drainage Pericardium: Normal thickness IMPRESSION: 1. Coronary calcium score of 19. This was 50th percentile for age and sex matched control. 2.  Normal coronary origin with right dominance. 3. 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 CAD-RADS 1. Minimal non-obstructive CAD (0-24%). Consider non-atherosclerotic causes of chest pain. Consider preventive therapy and risk factor modification. Electronically Signed: By: Oswaldo Milian M.D. On: 04/30/2021 16:29    Review of Systems  Blood pressure 114/68, pulse 60, temperature 98.3 F (36.8 C), temperature source Oral, resp. rate 14, height 5\' 11"  (1.803 m), weight 119.7 kg, SpO2 93 %. Physical Exam HENT:     Mouth/Throat:     Mouth: Mucous membranes are moist.     Pharynx: Oropharynx is clear.  Eyes:     General: No scleral icterus.    Conjunctiva/sclera: Conjunctivae normal.  Cardiovascular:     Rate and Rhythm: Normal rate and regular rhythm.     Heart sounds: Normal  heart sounds. No murmur heard. Pulmonary:     Effort: Pulmonary effort is normal.     Breath sounds: Normal breath sounds.  Abdominal:     General: There is no distension.     Palpations: Abdomen is soft. There is no mass.     Tenderness: There is no abdominal tenderness.  Musculoskeletal:        General: No swelling.     Cervical back: Neck supple.  Lymphadenopathy:     Cervical: No cervical adenopathy.  Skin:    General: Skin is warm and dry.  Neurological:     Mental Status: He is alert.     Assessment/Plan  Average risk screening colonoscopy.  Hildred Laser, MD 05/01/2021, 9:58 AM

## 2021-05-01 NOTE — Anesthesia Postprocedure Evaluation (Signed)
Anesthesia Post Note  Patient: William Farley  Procedure(s) Performed: COLONOSCOPY WITH PROPOFOL POLYPECTOMY BIOPSY  Patient location during evaluation: Phase II Anesthesia Type: General Level of consciousness: awake Pain management: pain level controlled Vital Signs Assessment: post-procedure vital signs reviewed and stable Respiratory status: spontaneous breathing and respiratory function stable Cardiovascular status: blood pressure returned to baseline and stable Postop Assessment: no headache and no apparent nausea or vomiting Anesthetic complications: no Comments: Late entry   No notable events documented.   Last Vitals:  Vitals:   05/01/21 0920 05/01/21 1028  BP: 114/68 (!) 92/48  Pulse: 60 67  Resp: 14 10  Temp: 36.8 C 36.8 C  SpO2: 93% 94%    Last Pain:  Vitals:   05/01/21 1028  TempSrc: Axillary  PainSc: 0-No pain                 [name]

## 2021-05-01 NOTE — Discharge Instructions (Signed)
No aspirin or NSAIDs for 24 hours Resume scheduled medications and diet as before. No driving for 24 hours. Physician will call with biopsy results.   

## 2021-05-01 NOTE — Op Note (Signed)
[facility] Patient Name: William Farley Procedure Date: 05/01/2021 9:43 AM MRN: [id number] Date of Birth: 05-25-1960 Attending MD: Hildred Laser , MD CSN: [id number] Age: 61 Admit Type: Outpatient Procedure:                Colonoscopy Indications:              Screening for colorectal malignant neoplasm Providers:                Hildred Laser, MD, Lambert Mody, Randa Spike, Technician Referring MD:             Phillips Odor, NP Medicines:                Propofol per Anesthesia Complications:            No immediate complications. Estimated Blood Loss:     Estimated blood loss was minimal. Procedure:                Pre-Anesthesia Assessment:                           - Prior to the procedure, a History and Physical                            was performed, and patient medications and                            allergies were reviewed. The patient's tolerance of                            previous anesthesia was also reviewed. The risks                            and benefits of the procedure and the sedation                            options and risks were discussed with the patient.                            All questions were answered, and informed consent                            was obtained. Prior Anticoagulants: The patient has                            taken no previous anticoagulant or antiplatelet                            agents. ASA Grade Assessment: II - A patient with                            mild systemic disease. After reviewing the risks  and benefits, the patient was deemed in                            satisfactory condition to undergo the procedure.                           After obtaining informed consent, the colonoscope                            was passed under direct vision. Throughout the                            procedure, the patient's blood pressure, pulse, and                             oxygen saturations were monitored continuously. The                            [id number]-HQ190L ([id number]) scope was introduced through                            the anus and advanced to the the cecum, identified                            by appendiceal orifice and ileocecal valve. The                            colonoscopy was performed without difficulty. The                            patient tolerated the procedure well. The quality                            of the bowel preparation was good. The ileocecal                            valve, appendiceal orifice, and rectum were                            photographed. Scope In: 10:07:12 AM Scope Out: 10:23:30 AM Scope Withdrawal Time: 0 hours 10 minutes 42 seconds  Total Procedure Duration: 0 hours 16 minutes 18 seconds  Findings:      The perianal and digital rectal examinations were normal.      A diminutive polyp was found in the cecum. Biopsies were taken with a       cold forceps for histology. The pathology specimen was placed into       Bottle Number 1.      A 5 mm polyp was found in the recto-sigmoid colon. The polyp was flat.       The polyp was removed with a cold snare. Resection and retrieval were       complete. The pathology specimen was placed into Bottle Number 1.      A single small-mouthed diverticulum was found in the splenic flexure.      Internal and  external hemorrhoids were found during retroflexion. The       hemorrhoids were small. Impression:               - One diminutive polyp in the cecum. Biopsied.                           - One 5 mm polyp at the recto-sigmoid colon,                            removed with a cold snare. Resected and retrieved.                           - Diverticulosis at the splenic flexure.                           -Internal and external hemorrhoids. Moderate Sedation:      Per Anesthesia Care Recommendation:           - Patient has a contact number available for                             emergencies. The signs and symptoms of potential                            delayed complications were discussed with the                            patient. Return to normal activities tomorrow.                            Written discharge instructions were provided to the                            patient.                           - Resume previous diet today.                           - Continue present medications.                           - No aspirin, ibuprofen, naproxen, or other                            non-steroidal anti-inflammatory drugs for 1 day.                           - Await pathology results.                           - Repeat colonoscopy is recommended. The                            colonoscopy date will be determined after pathology  results from today's exam become available for                            review. Procedure Code(s):        --- Professional ---                           [phone], Colonoscopy, flexible; with removal of                            tumor(s), polyp(s), or other lesion(s) by snare                            technique                           45380, 66, Colonoscopy, flexible; with biopsy,                            single or multiple Diagnosis Code(s):        --- Professional ---                           Z12.11, Encounter for screening for malignant                            neoplasm of colon                           K63.5, Polyp of colon                           K64.4, Residual hemorrhoidal skin tags                           K57.30, Diverticulosis of large intestine without                            perforation or abscess without bleeding CPT copyright 2019 American Medical Association. All rights reserved. The codes documented in this report are preliminary and upon coder review may  be revised to meet current compliance requirements. Hildred Laser, MD Hildred Laser, MD 05/01/2021 10:31:41  AM This report has been signed electronically. Number of Addenda: 0

## 2021-05-01 NOTE — Anesthesia Preprocedure Evaluation (Signed)
Anesthesia Evaluation  Patient identified by MRN, date of birth, ID band Patient awake    Reviewed: Allergy & Precautions, H&P , NPO status , Patient's Chart, lab work & pertinent test results, reviewed documented beta blocker date and time   Airway Mallampati: II  TM Distance: >3 FB Neck ROM: full    Dental no notable dental hx.    Pulmonary sleep apnea , former smoker,    Pulmonary exam normal breath sounds clear to auscultation       Cardiovascular Exercise Tolerance: Good negative cardio ROS   Rhythm:regular Rate:Normal     Neuro/Psych PSYCHIATRIC DISORDERS Anxiety Depression negative neurological ROS     GI/Hepatic Neg liver ROS, GERD  Medicated,  Endo/Other  negative endocrine ROS  Renal/GU negative Renal ROS  negative genitourinary   Musculoskeletal   Abdominal   Peds  Hematology negative hematology ROS (+)   Anesthesia Other Findings   Reproductive/Obstetrics negative OB ROS                             Anesthesia Physical Anesthesia Plan  ASA: 2  Anesthesia Plan: General   Post-op Pain Management:    Induction:   PONV Risk Score and Plan: Propofol infusion  Airway Management Planned:   Additional Equipment:   Intra-op Plan:   Post-operative Plan:   Informed Consent: I have reviewed the patients History and Physical, chart, labs and discussed the procedure including the risks, benefits and alternatives for the proposed anesthesia with the patient or authorized representative who has indicated his/her understanding and acceptance.     Dental Advisory Given  Plan Discussed with: CRNA  Anesthesia Plan Comments:         Anesthesia Quick Evaluation

## 2021-05-02 ENCOUNTER — Encounter (INDEPENDENT_AMBULATORY_CARE_PROVIDER_SITE_OTHER): Payer: Self-pay | Admitting: *Deleted

## 2021-05-02 LAB — SURGICAL PATHOLOGY

## 2021-05-05 ENCOUNTER — Encounter (HOSPITAL_COMMUNITY): Payer: Self-pay | Admitting: Internal Medicine

## 2021-05-15 ENCOUNTER — Ambulatory Visit: Payer: Federal, State, Local not specified - PPO | Admitting: Neurology

## 2021-05-15 ENCOUNTER — Other Ambulatory Visit: Payer: Self-pay

## 2021-05-15 DIAGNOSIS — R569 Unspecified convulsions: Secondary | ICD-10-CM

## 2021-05-21 ENCOUNTER — Telehealth: Payer: Self-pay | Admitting: Neurology

## 2021-05-21 DIAGNOSIS — Z79899 Other long term (current) drug therapy: Secondary | ICD-10-CM | POA: Diagnosis not present

## 2021-05-21 DIAGNOSIS — R569 Unspecified convulsions: Secondary | ICD-10-CM | POA: Diagnosis not present

## 2021-05-21 DIAGNOSIS — R7989 Other specified abnormal findings of blood chemistry: Secondary | ICD-10-CM | POA: Diagnosis not present

## 2021-05-21 DIAGNOSIS — D696 Thrombocytopenia, unspecified: Secondary | ICD-10-CM | POA: Diagnosis not present

## 2021-05-21 DIAGNOSIS — R7309 Other abnormal glucose: Secondary | ICD-10-CM | POA: Diagnosis not present

## 2021-05-21 NOTE — Telephone Encounter (Signed)
Patient called and said he has some questions about some labs he had today at his PCP. ? ?He said his platelet count today went from 133 to 107 and he was told to call and let Dr. Delice Lesch know. ? ?He will see a hematologist 05/28/21 and will have his MRI on 05/24/21. ?

## 2021-05-21 NOTE — Telephone Encounter (Signed)
Pt called an informed Depakote is probably contributing. Pls let him know the prolonged EEG was normal. Would reduce Depakote to 1/2 tab BID, proceed with brain MRI and Hematology evaluation. Pt verbalized understanding  ?

## 2021-05-21 NOTE — Telephone Encounter (Signed)
The Depakote is probably contributing. Pls let him know the prolonged EEG was normal. Would reduce Depakote to 1/2 tab BID, proceed with brain MRI and Hematology evaluation. Thanks ?

## 2021-05-24 ENCOUNTER — Ambulatory Visit
Admission: RE | Admit: 2021-05-24 | Discharge: 2021-05-24 | Disposition: A | Discharge status: Home / Self Care | Payer: No Typology Code available for payment source | Source: Ambulatory Visit | Attending: Neurology | Admitting: Neurology

## 2021-05-24 ENCOUNTER — Other Ambulatory Visit: Payer: Self-pay

## 2021-05-24 DIAGNOSIS — R569 Unspecified convulsions: Secondary | ICD-10-CM | POA: Diagnosis not present

## 2021-05-24 DIAGNOSIS — I619 Nontraumatic intracerebral hemorrhage, unspecified: Secondary | ICD-10-CM | POA: Diagnosis not present

## 2021-05-24 MED ORDER — GADOBENATE DIMEGLUMINE 529 MG/ML IV SOLN
20.0000 mL | Freq: Once | INTRAVENOUS | Status: AC | PRN
  Administered 2021-05-24: 20 mL via INTRAVENOUS

## 2021-05-26 ENCOUNTER — Telehealth: Payer: Self-pay | Admitting: Neurology

## 2021-05-26 NOTE — Procedures (Signed)
ELECTROENCEPHALOGRAM REPORT  Dates of Recording: 05/15/2021 12:47PM to 05/16/2021 12:48PM  Patient's Name: William Farley MRN: [id number] Date of Birth: 08-02-1960  Referring Provider: Dr. Ellouise Newer  Procedure: 24-hour ambulatory video EEG  History: This is a 61 year old man with an episode of loss of consciousness with shaking in 02/2021.  Medications:  DEPAKOTE 500 MG DR tablet VITAMIN D3 125 MCG (5000 UT) TABS VITAMIN B12 PO IRON PO ATIVAN 1 MG tablet TOPROL XL 25 MG 24 hr tablet PRILOSEC 20 MG capsule PAXIL 30 MG tablet SEROQUEL 300 MG tablet AMBIEN 10 MG tablet  Technical Summary: This is a 24-hour multichannel digital video EEG recording measured by the international 10-20 system with electrodes applied with paste and impedances below 5000 ohms performed as portable with EKG monitoring.  The digital EEG was referentially recorded, reformatted, and digitally filtered in a variety of bipolar and referential montages for optimal display.    DESCRIPTION OF RECORDING: During maximal wakefulness, the background activity consisted of a symmetric 9-10 Hz posterior dominant rhythm which was reactive to eye opening.  There were no epileptiform discharges or focal slowing seen in wakefulness.  During the recording, the patient progresses through wakefulness, drowsiness, and Stage 2 sleep.  Again, there were no epileptiform discharges seen.  Events: On 2/23 at 1539, he has a stress headache. Patient sitting on chair, no clinical changes seen. Electrographically, there were no EEG or EKG changes seen.  On 2/23 at 2119 hours, he has right groin pain. Patient lying on bed with no clinical changes seen. Electrographically, there were no EEG or EKG changes seen.  On 2/23 at 2125 hours, he has a temple headache. Patient lying under covers, not clearly visible, no changes seen on video. Electrographically, there were no EEG or EKG changes seen.  On 2/24 at 0720 hours, he has right temple  pain. He is sitting on chair with no clinical changes seen. Electrographically, there were no EEG or EKG changes seen.  On 2/24 at 0837 hours, he has stomach pain. He is sitting on chair, no clinical changes seen. Electrographically, there were no EEG or EKG changes seen.  On 2/24 at 1045 hours, he has stomach pain. Patient not on video. Electrographically, there were no EEG or EKG changes seen.   There were no electrographic seizures seen.  EKG lead was unremarkable.  IMPRESSION: This 24-hour ambulatory video EEG study is normal.    CLINICAL CORRELATION: A normal EEG does not exclude a clinical diagnosis of epilepsy. Typical events were not captured. Episodes of head, groin, stomach pain did not show any EEG correlate. If further clinical questions remain, inpatient video EEG monitoring may be helpful.   Ellouise Newer, M.D.

## 2021-05-26 NOTE — Telephone Encounter (Signed)
Records from [facility] reviewed, EEG done 04/17/2021 was normal wake and drowsy, no HV, IPS no changes. ?

## 2021-05-28 ENCOUNTER — Telehealth: Payer: Self-pay | Admitting: Neurology

## 2021-05-28 DIAGNOSIS — D6801 Von willebrand disease, type 1: Secondary | ICD-10-CM | POA: Diagnosis not present

## 2021-05-28 DIAGNOSIS — F4310 Post-traumatic stress disorder, unspecified: Secondary | ICD-10-CM | POA: Diagnosis not present

## 2021-05-28 DIAGNOSIS — Z79899 Other long term (current) drug therapy: Secondary | ICD-10-CM | POA: Diagnosis not present

## 2021-05-28 DIAGNOSIS — F419 Anxiety disorder, unspecified: Secondary | ICD-10-CM | POA: Diagnosis not present

## 2021-05-28 DIAGNOSIS — Z841 Family history of disorders of kidney and ureter: Secondary | ICD-10-CM | POA: Diagnosis not present

## 2021-05-28 DIAGNOSIS — Z7902 Long term (current) use of antithrombotics/antiplatelets: Secondary | ICD-10-CM | POA: Diagnosis not present

## 2021-05-28 DIAGNOSIS — I4891 Unspecified atrial fibrillation: Secondary | ICD-10-CM | POA: Diagnosis not present

## 2021-05-28 DIAGNOSIS — F329 Major depressive disorder, single episode, unspecified: Secondary | ICD-10-CM | POA: Diagnosis not present

## 2021-05-28 DIAGNOSIS — K219 Gastro-esophageal reflux disease without esophagitis: Secondary | ICD-10-CM | POA: Diagnosis not present

## 2021-05-28 DIAGNOSIS — D696 Thrombocytopenia, unspecified: Secondary | ICD-10-CM | POA: Diagnosis not present

## 2021-05-28 DIAGNOSIS — Z87442 Personal history of urinary calculi: Secondary | ICD-10-CM | POA: Diagnosis not present

## 2021-05-28 DIAGNOSIS — R569 Unspecified convulsions: Secondary | ICD-10-CM | POA: Diagnosis not present

## 2021-05-28 DIAGNOSIS — G4730 Sleep apnea, unspecified: Secondary | ICD-10-CM | POA: Diagnosis not present

## 2021-05-28 DIAGNOSIS — Z9049 Acquired absence of other specified parts of digestive tract: Secondary | ICD-10-CM | POA: Diagnosis not present

## 2021-05-28 DIAGNOSIS — Z9884 Bariatric surgery status: Secondary | ICD-10-CM | POA: Diagnosis not present

## 2021-05-28 NOTE — Telephone Encounter (Signed)
Pt called in stating he visited his oncologist at [facility] in [place] today. They want to take him off of divalproex due to it dropping his platelet count to dangerous levels. They told him Dr. Delice Lesch has to be the one to take him off of it and put him on something else. ?

## 2021-05-29 NOTE — Telephone Encounter (Signed)
I can see him on Monday 3/13 to discuss medications, thanks ?

## 2021-06-02 ENCOUNTER — Other Ambulatory Visit: Payer: Self-pay

## 2021-06-02 ENCOUNTER — Encounter: Payer: Self-pay | Admitting: Neurology

## 2021-06-02 ENCOUNTER — Ambulatory Visit: Payer: Federal, State, Local not specified - PPO | Admitting: Neurology

## 2021-06-02 VITALS — BP 130/84 | HR 74 | Resp 18 | Ht 71.0 in | Wt 269.0 lb

## 2021-06-02 DIAGNOSIS — R569 Unspecified convulsions: Secondary | ICD-10-CM

## 2021-06-02 NOTE — Patient Instructions (Signed)
Good to see you looking well. Start weaning off the Depakote '500mg'$ : take 1/2 tablet at night for 1 week, then stop. Follow-up in 4-5 months, call for any changes ? ? ?Seizure Precautions: ?1. If medication has been prescribed for you to prevent seizures, take it exactly as directed.  Do not stop taking the medicine without talking to your doctor first, even if you have not had a seizure in a long time.  ? ?2. Avoid activities in which a seizure would cause danger to yourself or to others.  Don't operate dangerous machinery, swim alone, or climb in high or dangerous places, such as on ladders, roofs, or girders.  Do not drive unless your doctor says you may. ? ?3. If you have any warning that you may have a seizure, lay down in a safe place where you can't hurt yourself.   ? ?4.  No driving for 6 months from last seizure, as per [facility]   Please refer to the following link on the [facility] website for more information: http://[email or web address].org/[email or web address]/driving/drivingu.cfm  ? ?5.  Maintain good sleep hygiene. Avoid alcohol. ? ?6.  Contact your doctor if you have any problems that may be related to the medicine you are taking. ? ?7.  Call 911 and bring the patient back to the ED if: ?      ? A.  The seizure lasts longer than 5 minutes.      ? B.  The patient doesn't awaken shortly after the seizure ? C.  The patient has new problems such as difficulty seeing, speaking or moving ? D.  The patient was injured during the seizure ? E.  The patient has a temperature over 102 F (39C) ? F.  The patient vomited and now is having trouble breathing ?      ? ?

## 2021-06-02 NOTE — Progress Notes (Signed)
? ?NEUROLOGY FOLLOW UP OFFICE NOTE ? ?William Farley ?[id number] ?11-27-1960 ? ?HISTORY OF PRESENT ILLNESS: ?I had the pleasure of seeing William Farley in follow-up in the neurology clinic on 06/02/2021.  The patient was last seen a month ago after he had a witnessed convulsion on 02/28/21 when his wife found him between the tub and toilet. He is again accompanied by his wife who helps supplement the history today.  Records and images were personally reviewed where available.  I personally reviewed MRI brain with and without contrast which was normal. His 24-hour EEG was normal. He developed thrombocytopenia and needs to stop the Depakote due to potential iatrogenic side effects. He and his wife deny any further seizures or seizure-like symptoms since 02/2021. No staring/unresponsive episodes, gaps in time, olfactory/gustatory hallucinations, focal numbness/tingling/weakness, myoclonic jerks. Sometimes he gets a cold sweat out of the blue, he eats something salty and it resolves. His wife again adds that they were told by EMS his sugar was low but she does not recall how low.  ? ? ?History On Initial Assessment 04/30/2021: This is a 61 year old right-handed man with a history of OSA, SVT, depression, PTSD, presenting for evaluation of need for Depakote started after an episode of loss of consciousness with shaking on 02/28/2021. He was in his usual state of health until 02/28/21 when his wife heard a thud. She heard him make a noise and found him between the tub and toilet. He was staring with legs and arms jerking, he kept biting his lip and tongue. This lasted for several minutes, then he was confused after, unable to answer questions. His last recollection is getting ready for bed, he does not recall being in the bathroom, then came to in the ambulance. He had bitten his tongue/lip and had bowel incontinence. His wife reports BP was fine and glucose was a little low. He was brought to the [facility] ER where  bloodwork was overall normal, glucose 167. TSH 7.291. UDS and EtOH negative. EKG and head CT without contrast were normal. Symptoms felt due to vasovagal syncope. On his follow-up with his PCP a few days later, he was started on Depakote for possible seizure. He was seen by a neurologist in [place] and reportedly had a normal EEG, report unavailable for review. He denies any prior history of seizures. He and his wife deny any other staring/unresponsive episodes, gaps in time, olfactory/gustatory hallucinations, deja vu, rising epigastric sensation, focal numbness/tingling/weakness, myoclonic jerks. He gets dizzy when standing up quickly. No headaches, diplopia, dysarthria/dysphagia, neck/back pain, bowel/bladder dysfunction. He gets occasional stomach pains due to GERD. No recent medication changes. He gets 8-8.5 hours of sleep with his CPAP, no sleep deprivation. He has prn lorazepam usually taken once a day depending on stress level, this was started after the seizure. Prior to the he was on Buspar. Mood is pretty good, he takes Seroquel. 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 He had a normal birth and early development.  There is no history of febrile convulsions, CNS infections such as meningitis/encephalitis, significant traumatic brain injury, neurosurgical procedures, or family history of seizures. ? ? ?PAST MEDICAL HISTORY: ?Past Medical History:  ?Diagnosis Date  ? Anxiety   ? Arthritis   ? Depression   ? GERD (gastroesophageal reflux disease)   ? History of kidney stones   ? Seizure ([place]) 02/2021  ? Sleep apnea   ? wears CPAP   ? ? ?MEDICATIONS: ?Current Outpatient Medications on  File Prior to Visit  ?Medication Sig Dispense Refill  ? Cholecalciferol (VITAMIN D3) 125 MCG (5000 UT) TABS Take 5,000 Units by mouth in the morning.    ? Cyanocobalamin (VITAMIN B12 PO) Take 1 tablet by mouth every other day.    ? divalproex (DEPAKOTE) 500 MG DR tablet Take 500 mg by mouth 2 (two) times  daily.    ? Ferrous Sulfate (IRON PO) Take 55 mg by mouth at bedtime.    ? LORazepam (ATIVAN) 1 MG tablet Take 1 mg by mouth 3 (three) times daily as needed for anxiety.    ? metoprolol succinate (TOPROL XL) 25 MG 24 hr tablet Take 1 tablet (25 mg total) by mouth daily. 90 tablet 2  ? MISC NATURAL PRODUCTS PO Take 1 tablet by mouth 3 (three) times daily. Golo Release Dietary Supplement    ? omeprazole (PRILOSEC) 20 MG capsule Take 20 mg by mouth in the morning.    ? PARoxetine (PAXIL) 30 MG tablet Take 30 mg by mouth in the morning.    ? QUEtiapine (SEROQUEL) 300 MG tablet Take 300 mg by mouth at bedtime.    ? zolpidem (AMBIEN) 10 MG tablet Take 10 mg by mouth at bedtime.    ? ?No current facility-administered medications on file prior to visit.  ? ? ?ALLERGIES: ?Allergies  ?Allergen Reactions  ? Codeine Itching, Rash and Other (See Comments)  ? ? ?FAMILY HISTORY: ?Family History  ?Problem Relation Age of Onset  ? Heart disease Father 63  ? Kidney failure Father   ? Colon cancer Neg Hx   ? ? ?SOCIAL HISTORY: ?Social History  ? ?Socioeconomic History  ? Marital status: Married  ?  Spouse name: Not on file  ? Number of children: Not on file  ? Years of education: Not on file  ? Highest education level: Not on file  ?Occupational History  ? Occupation: Environmental manager  ?Tobacco Use  ? Smoking status: Former  ?  Packs/day: 0.50  ?  Years: 10.00  ?  Pack years: 5.00  ?  Types: Cigarettes  ?  Quit date: 05/20/1996  ?  Years since quitting: 25.0  ? Smokeless tobacco: Never  ?Vaping Use  ? Vaping Use: Never used  ?Substance and Sexual Activity  ? Alcohol use: Never  ? Drug use: Never  ? Sexual activity: Not on file  ?Other Topics Concern  ? Not on file  ?Social History Narrative  ? Right handed   ? Married wife  ? One story home  ? ?Social Determinants of Health  ? ?Financial Resource Strain: Not on file  ?Food Insecurity: Not on file  ?Transportation Needs: Not on file  ?Physical Activity: Not on file  ?Stress: Not  on file  ?Social Connections: Not on file  ?Intimate Partner Violence: Not on file  ? ? ? ?PHYSICAL EXAM: ?Vitals:  ? 06/02/21 1304  ?BP: 130/84  ?Pulse: 74  ?Resp: 18  ?SpO2: 96%  ? ?General: No acute distress ?Head:  Normocephalic/atraumatic ?Skin/Extremities: No rash, no edema ?Neurological Exam: alert and awake. No aphasia or dysarthria. Fund of knowledge is appropriate.  Attention and concentration are normal.   Cranial nerves: Pupils equal, round. Extraocular movements intact with no nystagmus. Visual fields full.  No facial asymmetry.  Motor: Bulk and tone normal, muscle strength 5/5 throughout with no pronator drift.   Finger to nose testing intact.  Gait narrow-based and steady, able to tandem walk adequately.  Romberg negative. Mild left hand postural  tremor. ? ? ?IMPRESSION: ?This is a 61 yo RH man with a history of OSA, SVT, depression, PTSD, who was started on Depakote after an episode of loss of consciousness with shaking on 02/28/2021. Etiology unclear, convulsive syncope versus seizure. His neurological exam is normal, brain MRI and 24-hour EEG normal. We again discussed that after an initial seizure, unless there are significant risk factors, an abnormal neurological exam, an EEG showing epileptiform abnormalities, and/or abnormal neuroimaging, treatment with an antiepileptic drug is not indicated. We discussed 10% of the population may have a single seizure. Patients with a single unprovoked seizure have a recurrence rate of 33% after a single seizure and 73% after a second seizure. He will start weaning off Depakote to 1/2 tab qhs x 1 week, then stop. Follow-up with Hematology for repeat CBC once he is off Depakote. He knows to call our office for any change in symptoms. We again discussed [place] driving restrictions which indicate a patient needs to free of seizures or events of altered awareness for 6 months prior to resuming driving. Follow-up in 4 months, call for any changes.  ? ? ?Thank you for  allowing me to participate in his care.  Please do not hesitate to call for any questions or concerns. ? ? ? ?Ellouise Newer, M.D. ? ? ?CC: Garey Ham, NP, Dr. Erven Colla ? ? ?

## 2021-06-09 ENCOUNTER — Ambulatory Visit: Payer: Federal, State, Local not specified - PPO | Admitting: Urology

## 2021-06-09 ENCOUNTER — Encounter: Payer: Self-pay | Admitting: Urology

## 2021-06-09 ENCOUNTER — Ambulatory Visit: Payer: No Typology Code available for payment source | Admitting: Neurology

## 2021-06-10 ENCOUNTER — Ambulatory Visit (HOSPITAL_COMMUNITY)
Admission: RE | Admit: 2021-06-10 | Discharge: 2021-06-10 | Disposition: A | Discharge status: Home / Self Care | Payer: Federal, State, Local not specified - PPO | Source: Ambulatory Visit | Attending: Urology | Admitting: Urology

## 2021-06-10 ENCOUNTER — Other Ambulatory Visit: Payer: Self-pay

## 2021-06-10 DIAGNOSIS — N200 Calculus of kidney: Secondary | ICD-10-CM | POA: Diagnosis not present

## 2021-06-10 DIAGNOSIS — Z87442 Personal history of urinary calculi: Secondary | ICD-10-CM | POA: Diagnosis not present

## 2021-06-11 ENCOUNTER — Ambulatory Visit (INDEPENDENT_AMBULATORY_CARE_PROVIDER_SITE_OTHER): Payer: No Typology Code available for payment source | Admitting: Urology

## 2021-06-11 ENCOUNTER — Encounter: Payer: Self-pay | Admitting: Urology

## 2021-06-11 VITALS — BP 95/62 | HR 79

## 2021-06-11 DIAGNOSIS — N200 Calculus of kidney: Secondary | ICD-10-CM

## 2021-06-11 LAB — URINALYSIS, ROUTINE W REFLEX MICROSCOPIC
Bilirubin, UA: NEGATIVE
Glucose, UA: NEGATIVE
Ketones, UA: NEGATIVE
Leukocytes,UA: NEGATIVE
Nitrite, UA: NEGATIVE
Protein,UA: NEGATIVE
RBC, UA: NEGATIVE
Specific Gravity, UA: 1.025 (ref 1.005–1.030)
Urobilinogen, Ur: 0.2 mg/dL (ref 0.2–1.0)
pH, UA: 5.5 (ref 5.0–7.5)

## 2021-06-11 NOTE — Patient Instructions (Signed)
Dietary Guidelines to Help Prevent Kidney Stones Kidney stones are deposits of minerals and salts that form inside your kidneys. Your risk of developing kidney stones may be greater depending on your diet, your lifestyle, the medicines you take, and whether you have certain medical conditions. Most people can lower their chances of developing kidney stones by following the instructions below. Your dietitian may give you more specific instructions depending on your overall health and the type of kidney stones you tend to develop. What are tips for following this plan? Reading food labels  Choose foods with "no salt added" or "low-salt" labels. Limit your salt (sodium) intake to less than 1,500 mg a day. Choose foods with calcium for each meal and snack. Try to eat about 300 mg of calcium at each meal. Foods that contain 200-500 mg of calcium a serving include: 8 oz (237 mL) of milk, calcium-fortifiednon-dairy milk, and calcium-fortifiedfruit juice. Calcium-fortified means that calcium has been added to these drinks. 8 oz (237 mL) of kefir, yogurt, and soy yogurt. 4 oz (114 g) of tofu. 1 oz (28 g) of cheese. 1 cup (150 g) of dried figs. 1 cup (91 g) of cooked broccoli. One 3 oz (85 g) can of sardines or mackerel. Most people need 1,000-1,500 mg of calcium a day. Talk to your dietitian about how much calcium is recommended for you. Shopping Buy plenty of fresh fruits and vegetables. Most people do not need to avoid fruits and vegetables, even if these foods contain nutrients that may contribute to kidney stones. When shopping for convenience foods, choose: Whole pieces of fruit. Pre-made salads with dressing on the side. Low-fat fruit and yogurt smoothies. Avoid buying frozen meals or prepared deli foods. These can be high in sodium. Look for foods with live cultures, such as yogurt and kefir. Choose high-fiber grains, such as whole-wheat breads, oat bran, and wheat cereals. Cooking Do not add  salt to food when cooking. Place a salt shaker on the table and allow each person to add his or her own salt to taste. Use vegetable protein, such as beans, textured vegetable protein (TVP), or tofu, instead of meat in pasta, casseroles, and soups. Meal planning Eat less salt, if told by your dietitian. To do this: Avoid eating processed or pre-made food. Avoid eating fast food. Eat less animal protein, including cheese, meat, poultry, or fish, if told by your dietitian. To do this: Limit the number of times you have meat, poultry, fish, or cheese each week. Eat a diet free of meat at least 2 days a week. Eat only one serving each day of meat, poultry, fish, or seafood. When you prepare animal protein, cut pieces into small portion sizes. For most meat and fish, one serving is about the size of the palm of your hand. Eat at least five servings of fresh fruits and vegetables each day. To do this: Keep fruits and vegetables on hand for snacks. Eat one piece of fruit or a handful of berries with breakfast. Have a salad and fruit at lunch. Have two kinds of vegetables at dinner. Limit foods that are high in a substance called oxalate. These include: Spinach (cooked), rhubarb, beets, sweet potatoes, and Swiss chard. Peanuts. Potato chips, french fries, and baked potatoes with skin on. Nuts and nut products. Chocolate. If you regularly take a diuretic medicine, make sure to eat at least 1 or 2 servings of fruits or vegetables that are high in potassium each day. These include: Avocado. Banana. Orange, prune,   carrot, or tomato juice. Baked potato. Cabbage. Beans and split peas. Lifestyle  Drink enough fluid to keep your urine pale yellow. This is the most important thing you can do. Spread your fluid intake throughout the day. If you drink alcohol: Limit how much you use to: 0-1 drink a day for women who are not pregnant. 0-2 drinks a day for men. Be aware of how much alcohol is in your  drink. In the [place] one drink equals one 12 oz bottle of beer (355 mL), one 5 oz glass of wine (148 mL), or one 1 oz glass of hard liquor (44 mL). Lose weight if told by your health care provider. Work with your dietitian to find an eating plan and weight loss strategies that work best for you. General information Talk to your health care provider and dietitian about taking daily supplements. You may be told the following depending on your health and the cause of your kidney stones: Not to take supplements with vitamin C. To take a calcium supplement. To take a daily probiotic supplement. To take other supplements such as magnesium, fish oil, or vitamin B6. Take over-the-counter and prescription medicines only as told by your health care provider. These include supplements. What foods should I limit? Limit your intake of the following foods, or eat them as told by your dietitian. Vegetables Spinach. Rhubarb. Beets. Canned vegetables. Pickles. Olives. Baked potatoes with skin. Grains Wheat bran. Baked goods. Salted crackers. Cereals high in sugar. Meats and other proteins Nuts. Nut butters. Large portions of meat, poultry, or fish. Salted, precooked, or cured meats, such as sausages, meat loaves, and hot dogs. Dairy Cheese. Beverages Regular soft drinks. Regular vegetable juice. Seasonings and condiments Seasoning blends with salt. Salad dressings. Soy sauce. Ketchup. Barbecue sauce. Other foods Canned soups. Canned pasta sauce. Casseroles. Pizza. Lasagna. Frozen meals. Potato chips. French fries. The items listed above may not be a complete list of foods and beverages you should limit. Contact a dietitian for more information. What foods should I avoid? Talk to your dietitian about specific foods you should avoid based on the type of kidney stones you have and your overall health. Fruits Grapefruit. The item listed above may not be a complete list of foods and beverages you should  avoid. Contact a dietitian for more information. Summary Kidney stones are deposits of minerals and salts that form inside your kidneys. You can lower your risk of kidney stones by making changes to your diet. The most important thing you can do is drink enough fluid. Drink enough fluid to keep your urine pale yellow. Talk to your dietitian about how much calcium you should have each day, and eat less salt and animal protein as told by your dietitian. This information is not intended to replace advice given to you by your health care provider. Make sure you discuss any questions you have with your health care provider. Document Revised: 03/02/2019 Document Reviewed: 03/02/2019 Elsevier Patient Education  2022 [employer] Inc[employer]  

## 2021-06-11 NOTE — Progress Notes (Signed)
06/11/2021 10:40 AM   William Farley [birth year] [id number]  Referring provider: Alden Hipp, NP [address] [place],  [place] [address]  Followup nephrolithiasis  HPI: [name] is a 60yo here for followup for nephrolithiasis. No stone events since last visit. He denies any flank pain. KUb from 3/21 shows a 2mm right lower pole renal calculus. No significant LUTS./ No other complaints today   PMH: Past Medical History:  Diagnosis Date   Anxiety    Arthritis    Depression    GERD (gastroesophageal reflux disease)    History of kidney stones    Seizure ([facility]) 02/2021   Sleep apnea    wears CPAP     Surgical History: Past Surgical History:  Procedure Laterality Date   APPENDECTOMY     BIOPSY  05/01/2021   Procedure: BIOPSY;  Surgeon: Malissa Hippo, MD;  Location: [facility];  Service: Endoscopy;;   CERVICAL SPINE SURGERY  2018   CHOLECYSTECTOMY     COLONOSCOPY WITH PROPOFOL N/A 05/01/2021   Procedure: COLONOSCOPY WITH PROPOFOL;  Surgeon: Malissa Hippo, MD;  Location: [facility];  Service: Endoscopy;  Laterality: N/A;  1050   CYST EXCISION     CYSTOSCOPY     CYSTOSCOPY WITH RETROGRADE PYELOGRAM, URETEROSCOPY AND STENT PLACEMENT Right 08/08/2020   Procedure: CYSTOSCOPY WITH RIGHT RETROGRADE PYELOGRAM AND RIGHT URETEROSCOPY;  Surgeon: Malen Gauze, MD;  Location: [facility];  Service: Urology;  Laterality: Right;   EXTRACORPOREAL SHOCK WAVE LITHOTRIPSY Right 05/21/2020   Procedure: EXTRACORPOREAL SHOCK WAVE LITHOTRIPSY (ESWL);  Surgeon: Malen Gauze, MD;  Location: [facility];  Service: Urology;  Laterality: Right;   GASTRIC BYPASS OPEN  2003   INCISIONAL HERNIA REPAIR     kidney stone removal     KNEE ARTHROSCOPY Left    multiple   NECK SURGERY     POLYPECTOMY  05/01/2021   Procedure: POLYPECTOMY;  Surgeon: Malissa Hippo, MD;  Location: [facility];  Service: Endoscopy;;   REPLACEMENT TOTAL KNEE Right    RHINOPLASTY     STONE EXTRACTION WITH BASKET  Right 08/08/2020   Procedure: STONE EXTRACTION WITH BASKET;  Surgeon: Malen Gauze, MD;  Location: [facility];  Service: Urology;  Laterality: Right;    Home Medications:  Allergies as of 06/11/2021       Reactions   Codeine Itching, Rash, Other (See Comments)        Medication List        Accurate as of June 11, 2021 10:40 AM. If you have any questions, ask your nurse or doctor.          IRON PO Take 55 mg by mouth at bedtime.   LORazepam 1 MG tablet Commonly known as: ATIVAN Take 1 mg by mouth 3 (three) times daily as needed for anxiety.   metoprolol succinate 25 MG 24 hr tablet Commonly known as: Toprol XL Take 1 tablet (25 mg total) by mouth daily.   MISC NATURAL PRODUCTS PO Take 1 tablet by mouth 3 (three) times daily. Golo Release Dietary Supplement   omeprazole 20 MG capsule Commonly known as: PRILOSEC Take 20 mg by mouth in the morning.   PARoxetine 30 MG tablet Commonly known as: PAXIL Take 30 mg by mouth in the morning.   QUEtiapine 300 MG tablet Commonly known as: SEROQUEL Take 300 mg by mouth at bedtime.   VITAMIN B12 PO Take 1 tablet by mouth every other day.   Vitamin D3 125  MCG (5000 UT) Tabs Take 5,000 Units by mouth in the morning.   zolpidem 10 MG tablet Commonly known as: AMBIEN Take 10 mg by mouth at bedtime.        Allergies:  Allergies  Allergen Reactions   Codeine Itching, Rash and Other (See Comments)    Family History: Family History  Problem Relation Age of Onset   Heart disease Father 85   Kidney failure Father    Colon cancer Neg Hx     Social History:  reports that he quit smoking about 25 years ago. His smoking use included cigarettes. He has a 5.00 pack-year smoking history. He has never used smokeless tobacco. He reports that he does not drink alcohol and does not use drugs.  ROS: All other review of systems were reviewed and are negative except what is noted above in HPI  Physical Exam: BP 95/62    Pulse 79   Constitutional:  Alert and oriented, No acute distress. HEENT: [place] AT, moist mucus membranes.  Trachea midline, no masses. Cardiovascular: No clubbing, cyanosis, or edema. Respiratory: Normal respiratory effort, no increased work of breathing. GI: Abdomen is soft, nontender, nondistended, no abdominal masses GU: No CVA tenderness.  Lymph: No cervical or inguinal lymphadenopathy. Skin: No rashes, bruises or suspicious lesions. Neurologic: Grossly intact, no focal deficits, moving all 4 extremities. Psychiatric: Normal mood and affect.  Laboratory Data: Lab Results  Component Value Date   WBC 4.8 08/06/2020   HGB 14.3 08/06/2020   HCT 42.0 08/06/2020   MCV 90.1 08/06/2020   PLT 128 (L) 08/06/2020    Lab Results  Component Value Date   CREATININE 1.00 04/21/2021    No results found for: PSA  No results found for: TESTOSTERONE  No results found for: HGBA1C  Urinalysis    Component Value Date/Time   APPEARANCEUR Clear 06/26/2020 1352   GLUCOSEU Negative 06/26/2020 1352   BILIRUBINUR Negative 06/26/2020 1352   PROTEINUR Negative 06/26/2020 1352   NITRITE Negative 06/26/2020 1352   LEUKOCYTESUR Negative 06/26/2020 1352    Lab Results  Component Value Date   LABMICR See below: 06/26/2020   WBCUA None seen 06/26/2020   LABEPIT 0-10 06/26/2020   BACTERIA None seen 06/26/2020    Pertinent Imaging: KUb 06/10/2021: Images reviewed and discussed with the patient  Results for orders placed during the hospital encounter of 06/18/20  Abdomen 1 view (KUB)  Narrative CLINICAL DATA:  Nephrolithiasis  EXAM: ABDOMEN - 1 VIEW  COMPARISON:  06/05/2020  FINDINGS: 4 mm calculus overlies the lower pole the right kidney, unchanged from a prior CT examination of 02/29/2020. No other definite nephro or urolithiasis identified. Cholecystectomy clips are seen in the right upper quadrant. Surgical staple line noted within the left mid abdomen. Normal abdominal gas  pattern. Several phleboliths are noted within the left hemipelvis.  IMPRESSION: Stable right nephrolithiasis.  No urolithiasis.   Electronically Signed By: Helyn Numbers MD On: 06/18/2020 23:52  No results found for this or any previous visit.  No results found for this or any previous visit.  No results found for this or any previous visit.  Results for orders placed during the hospital encounter of 10/11/20  Ultrasound renal complete  Narrative CLINICAL DATA:  History of nephrolithiasis.  EXAM: RENAL / URINARY TRACT ULTRASOUND COMPLETE  COMPARISON:  None.  FINDINGS: Right Kidney:  Renal measurements: 11.8 cm x 7.1 cm x 6.7 cm = volume: 293 mL. Echogenicity within normal limits. A 6 mm shadowing echogenic renal calculus  is seen within the lower pole of the right kidney. No mass or hydronephrosis visualized.  Left Kidney:  Renal measurements: 13.8 cm x 5.7 cm x 4.8 cm = volume: 200 mL. Echogenicity within normal limits. No mass or hydronephrosis visualized.  Bladder:  Appears normal for degree of bladder distention.  Other:  None.  IMPRESSION: 6 mm nonobstructing renal stone within the right kidney.   Electronically Signed By: Aram Candela M.D. On: 10/12/2020 19:10  No results found for this or any previous visit.  No results found for this or any previous visit.  Results for orders placed in visit on 11/19/20  CT RENAL STONE STUDY  Narrative CLINICAL DATA:  Two weeks of intermittent sharp severe nonradiating right flank pain.  EXAM: CT ABDOMEN AND PELVIS WITHOUT CONTRAST  TECHNIQUE: Multidetector CT imaging of the abdomen and pelvis was performed following the standard protocol without IV contrast.  COMPARISON:  CT February 29, 2020  FINDINGS: Lower chest: No acute abnormality.  Hepatobiliary: Unremarkable noncontrast appearance of the hepatic parenchyma. Gallbladder surgically absent. No biliary ductal dilation.  Pancreas:  Within normal limits.  Spleen: Within normal limits.  Adrenals/Urinary Tract: Bilateral adrenal glands are unremarkable. No hydronephrosis. Left kidney is unremarkable. Punctate nonobstructive right lower pole renal stone. No obstructive ureteral or bladder calculus visualized. Urinary bladder is decompressed limiting evaluation.  Stomach/Bowel: Prior gastric bypass. No pathologic dilation of small or large bowel. Appendix is surgically absent.  Vascular/Lymphatic: Aortic atherosclerosis without abdominal aortic aneurysm. No pathologically enlarged abdominal or pelvic lymph nodes.  Reproductive: Prostate is unremarkable.  Other: Fat in bilateral inguinal canals.  No abdominopelvic ascites.  Musculoskeletal: Mild thoracolumbar spondylosis. No acute osseous abnormality.  IMPRESSION: 1. No acute findings in the abdomen or pelvis. 2. Punctate nonobstructive right lower pole renal stone. No obstructive ureteral or bladder calculus visualized. 3. Prior gastric bypass and cholecystectomy. 4.  Aortic Atherosclerosis (ICD10-I70.0).   Electronically Signed By: Maudry Mayhew M.D. On: 11/20/2020 11:11   Assessment & Plan:    1. Nephrolithiasis -RTC 1 year with KUB - Urinalysis, Routine w reflex microscopic   No follow-ups on file.  Wilkie Aye, MD  [facility] [place]

## 2021-07-28 DIAGNOSIS — D696 Thrombocytopenia, unspecified: Secondary | ICD-10-CM | POA: Diagnosis not present

## 2021-08-04 DIAGNOSIS — D6801 Von willebrand disease, type 1: Secondary | ICD-10-CM | POA: Diagnosis not present

## 2021-08-04 DIAGNOSIS — D709 Neutropenia, unspecified: Secondary | ICD-10-CM | POA: Diagnosis not present

## 2021-08-04 DIAGNOSIS — Z6841 Body Mass Index (BMI) 40.0 and over, adult: Secondary | ICD-10-CM | POA: Diagnosis not present

## 2021-08-04 DIAGNOSIS — D6101 Constitutional (pure) red blood cell aplasia: Secondary | ICD-10-CM | POA: Diagnosis not present

## 2021-08-04 DIAGNOSIS — D696 Thrombocytopenia, unspecified: Secondary | ICD-10-CM | POA: Diagnosis not present

## 2021-09-05 DIAGNOSIS — D696 Thrombocytopenia, unspecified: Secondary | ICD-10-CM | POA: Diagnosis not present

## 2021-09-05 DIAGNOSIS — Z6839 Body mass index (BMI) 39.0-39.9, adult: Secondary | ICD-10-CM | POA: Diagnosis not present

## 2021-09-30 DIAGNOSIS — Z6838 Body mass index (BMI) 38.0-38.9, adult: Secondary | ICD-10-CM | POA: Diagnosis not present

## 2021-09-30 DIAGNOSIS — M7022 Olecranon bursitis, left elbow: Secondary | ICD-10-CM | POA: Diagnosis not present

## 2021-10-02 DIAGNOSIS — M1712 Unilateral primary osteoarthritis, left knee: Secondary | ICD-10-CM | POA: Diagnosis not present

## 2021-10-02 DIAGNOSIS — M7022 Olecranon bursitis, left elbow: Secondary | ICD-10-CM | POA: Diagnosis not present

## 2021-10-02 DIAGNOSIS — Z6839 Body mass index (BMI) 39.0-39.9, adult: Secondary | ICD-10-CM | POA: Diagnosis not present

## 2021-10-08 DIAGNOSIS — Z6839 Body mass index (BMI) 39.0-39.9, adult: Secondary | ICD-10-CM | POA: Diagnosis not present

## 2021-10-08 DIAGNOSIS — M7022 Olecranon bursitis, left elbow: Secondary | ICD-10-CM | POA: Diagnosis not present

## 2021-10-16 DIAGNOSIS — Z6839 Body mass index (BMI) 39.0-39.9, adult: Secondary | ICD-10-CM | POA: Diagnosis not present

## 2021-10-16 DIAGNOSIS — M1712 Unilateral primary osteoarthritis, left knee: Secondary | ICD-10-CM | POA: Diagnosis not present

## 2021-10-16 DIAGNOSIS — M25422 Effusion, left elbow: Secondary | ICD-10-CM | POA: Diagnosis not present

## 2021-10-16 DIAGNOSIS — M7022 Olecranon bursitis, left elbow: Secondary | ICD-10-CM | POA: Diagnosis not present

## 2021-10-23 ENCOUNTER — Ambulatory Visit
Admission: EM | Admit: 2021-10-23 | Discharge: 2021-10-23 | Disposition: A | Discharge status: Home / Self Care | Payer: No Typology Code available for payment source | Attending: Family Medicine | Admitting: Family Medicine

## 2021-10-23 ENCOUNTER — Encounter: Payer: Self-pay | Admitting: Emergency Medicine

## 2021-10-23 DIAGNOSIS — R21 Rash and other nonspecific skin eruption: Secondary | ICD-10-CM | POA: Diagnosis not present

## 2021-10-23 MED ORDER — METHYLPREDNISOLONE SODIUM SUCC 125 MG IJ SOLR
80.0000 mg | Freq: Once | INTRAMUSCULAR | Status: AC
  Administered 2021-10-23: 80 mg via INTRAMUSCULAR

## 2021-10-23 MED ORDER — CLOBETASOL PROPIONATE 0.05 % EX OINT: 1.0000 | TOPICAL_OINTMENT | Freq: Two times a day (BID) | CUTANEOUS | 0 refills | Status: DC

## 2021-10-23 NOTE — ED Provider Notes (Signed)
[facility]    CSN: [id number] Arrival date & time: 10/23/21  1105      History   Chief Complaint No chief complaint on file.   HPI William Farley is a 61 y.o. male.   Presenting today with an itching, burning rash to the right arm that has been present for the past week.  States started a small area on the upper arm and is now spread to the forearm as well.  Denies blistering, bleeding, drainage, new soaps or products at home, new medications, new foods, sick contacts, history of dermatologic issues.  Has not been trying anything over-the-counter for symptoms.   Past Medical History:  Diagnosis Date   Anxiety    Arthritis    Depression    GERD (gastroesophageal reflux disease)    History of kidney stones    Seizure ([place]) 02/2021   Sleep apnea    wears CPAP    There are no problems to display for this patient.   Past Surgical History:  Procedure Laterality Date   APPENDECTOMY     BIOPSY  05/01/2021   Procedure: BIOPSY;  Surgeon: Rogene Houston, MD;  Location: [facility];  Service: Endoscopy;;   CERVICAL SPINE SURGERY  2018   CHOLECYSTECTOMY     COLONOSCOPY WITH PROPOFOL N/A 05/01/2021   Procedure: COLONOSCOPY WITH PROPOFOL;  Surgeon: Rogene Houston, MD;  Location: [facility];  Service: Endoscopy;  Laterality: N/A;  1050   CYST EXCISION     CYSTOSCOPY     CYSTOSCOPY WITH RETROGRADE PYELOGRAM, URETEROSCOPY AND STENT PLACEMENT Right 08/08/2020   Procedure: CYSTOSCOPY WITH RIGHT RETROGRADE PYELOGRAM AND RIGHT URETEROSCOPY;  Surgeon: Cleon Gustin, MD;  Location: [facility];  Service: Urology;  Laterality: Right;   EXTRACORPOREAL SHOCK WAVE LITHOTRIPSY Right 05/21/2020   Procedure: EXTRACORPOREAL SHOCK WAVE LITHOTRIPSY (ESWL);  Surgeon: Cleon Gustin, MD;  Location: [facility];  Service: Urology;  Laterality: Right;   GASTRIC BYPASS OPEN  2003   INCISIONAL HERNIA REPAIR     kidney stone removal     KNEE ARTHROSCOPY Left    multiple   NECK SURGERY      POLYPECTOMY  05/01/2021   Procedure: POLYPECTOMY;  Surgeon: Rogene Houston, MD;  [facility];  Service: Endoscopy;;   REPLACEMENT TOTAL KNEE Right    RHINOPLASTY     STONE EXTRACTION WITH BASKET Right 08/08/2020   Procedure: STONE EXTRACTION WITH BASKET;  Surgeon: Cleon Gustin, MD;  Location: [facility];  Service: Urology;  Laterality: Right;     Home Medications    Prior to Admission medications   Medication Sig Start Date End Date Taking? Authorizing Provider  clobetasol ointment (TEMOVATE) 6.96 % Apply 1 Application topically 2 (two) times daily. 10/23/21  Yes Volney American, PA-C  Cholecalciferol (VITAMIN D3) 125 MCG (5000 UT) TABS Take 5,000 Units by mouth in the morning.    [provider]  Cyanocobalamin (VITAMIN B12 PO) Take 1 tablet by mouth every other day.    [provider]  Ferrous Sulfate (IRON PO) Take 55 mg by mouth at bedtime.    [provider]  LORazepam (ATIVAN) 1 MG tablet Take 1 mg by mouth 3 (three) times daily as needed for anxiety. 04/17/21   [provider]  metoprolol succinate (TOPROL XL) 25 MG 24 hr tablet Take 1 tablet (25 mg total) by mouth daily. 04/21/21   O'NealCassie Freer, MD  MISC NATURAL PRODUCTS PO Take 1  tablet by mouth 3 (three) times daily. [place] Release Dietary Supplement    [provider]  omeprazole (PRILOSEC) 20 MG capsule Take 20 mg by mouth in the morning. 04/11/20   [provider]  PARoxetine (PAXIL) 30 MG tablet Take 30 mg by mouth in the morning.    [provider]  QUEtiapine (SEROQUEL) 300 MG tablet Take 300 mg by mouth at bedtime.    [provider]  zolpidem (AMBIEN) 10 MG tablet Take 10 mg by mouth at bedtime.    [provider]    Family History Family History  Problem Relation Age of Onset   Heart disease Father 77   Kidney failure Father    Colon cancer Neg Hx     Social History Social History   Tobacco Use   Smoking  status: Former    Packs/day: 0.50    Years: 10.00    Total pack years: 5.00    Types: Cigarettes    Quit date: 05/20/1996    Years since quitting: 25.4   Smokeless tobacco: Never  Vaping Use   Vaping Use: Never used  Substance Use Topics   Alcohol use: Never   Drug use: Never     Allergies   Codeine   Review of Systems Review of Systems PER HPI  Physical Exam Triage Vital Signs ED Triage Vitals  Enc Vitals Group     BP 10/23/21 1113 (!) 145/85     Pulse Rate 10/23/21 1113 64     Resp 10/23/21 1113 18     Temp 10/23/21 1113 (!) 97.4 F (36.3 C)     Temp Source 10/23/21 1113 Oral     SpO2 10/23/21 1113 95 %     Weight --      Height --      Head Circumference --      Peak Flow --      Pain Score 10/23/21 1114 5     Pain Loc --      Pain Edu? --      Excl. in [place]? --    No data found.  Updated Vital Signs BP (!) 145/85 (BP Location: Right Arm)   Pulse 64   Temp (!) 97.4 F (36.3 C) (Oral)   Resp 18   SpO2 95%   Visual Acuity Right Eye Distance:   Left Eye Distance:   Bilateral Distance:    Right Eye Near:   Left Eye Near:    Bilateral Near:     Physical Exam Vitals and nursing note reviewed.  Constitutional:      Appearance: Normal appearance.  HENT:     Head: Atraumatic.  Eyes:     Extraocular Movements: Extraocular movements intact.     Conjunctiva/sclera: Conjunctivae normal.  Cardiovascular:     Rate and Rhythm: Normal rate and regular rhythm.  Pulmonary:     Effort: Pulmonary effort is normal.     Breath sounds: Normal breath sounds.  Musculoskeletal:        General: Normal range of motion.     Cervical back: Normal range of motion and neck supple.  Skin:    General: Skin is warm and dry.     Findings: Rash present.     Comments: Erythematous maculopapular rash in patches down right arm, no blisters or vesicular appearance  Neurological:     General: No focal deficit present.     Mental Status: He is oriented to person, place, and  time.  Psychiatric:  Mood and Affect: Mood normal.        Thought Content: Thought content normal.        Judgment: Judgment normal.      UC Treatments / Results  Labs (all labs ordered are listed, but only abnormal results are displayed) Labs Reviewed - No data to display  EKG   Radiology No results found.  Procedures Procedures (including critical care time)  Medications Ordered in UC Medications  methylPREDNISolone sodium succinate (SOLU-MEDROL) 125 mg/2 mL injection 80 mg (80 mg Intramuscular Given 10/23/21 1152)    Initial Impression / Assessment and Plan / UC Course  I have reviewed the triage vital signs and the nursing notes.  Pertinent labs & imaging results that were available during my care of the patient were reviewed by me and considered in my medical decision making (see chart for details).     More consistent appearing with a poison ivy dermatitis than a shingles type picture.  We will treat with clobetasol ointment, Solu-Medrol IM and antihistamines.  Close monitoring, return for worsening symptoms.  Final Clinical Impressions(s) / UC Diagnoses   Final diagnoses:  Rash   Discharge Instructions   None    ED Prescriptions     Medication Sig Dispense Auth. Provider   clobetasol ointment (TEMOVATE) 6.60 % Apply 1 Application topically 2 (two) times daily. 60 g Volney American, [place]      PDMP not reviewed this encounter.   Volney American, [place] 10/23/21 1154

## 2021-10-23 NOTE — ED Triage Notes (Signed)
Itchy burning Rash on right arm since this week

## 2021-10-29 ENCOUNTER — Encounter: Payer: Self-pay | Admitting: Neurology

## 2021-10-29 ENCOUNTER — Ambulatory Visit (INDEPENDENT_AMBULATORY_CARE_PROVIDER_SITE_OTHER): Payer: Federal, State, Local not specified - PPO | Admitting: Neurology

## 2021-10-29 VITALS — BP 128/81 | HR 62 | Ht 71.0 in | Wt 263.0 lb

## 2021-10-29 DIAGNOSIS — R569 Unspecified convulsions: Secondary | ICD-10-CM

## 2021-10-29 NOTE — Progress Notes (Signed)
NEUROLOGY FOLLOW UP OFFICE NOTE  MAXIME BECKNER [id number] 1960/11/10  HISTORY OF PRESENT ILLNESS: I had the pleasure of seeing Tel Hevia in follow-up in the neurology clinic on 10/29/2021.  The patient was 61 last seen 6 months ago after an episode of loss of consciousness with shaking that occurred 02/28/21. Brain MRI with and without contrast and 24-hour EEG normal. He was started on Depakote by his PCP but developed thrombocytopenia. On his last visit, he was weaned off Depakote, last platelet count in May normal. He denies any further episodes of loss of consciousness/shaking since 02/2021. He has always had times he would stare, but denies any loss of time or unresponsiveness. He has PTSD and depression, recently switched from Paxil and Seroquel to Cymbalta. He denies any olfactory/gustatory hallucinations, focal numbness/tingling/weakness, myoclonic jerks. No headaches, dizziness, vision changes, no falls. Glucose levels have been good. Sleep is rotten, he only gets a couple of hours despite taking Ambien. When his head hits the pillow, he starts thinking of so many things. He sees a therapist.    History On Initial Assessment 04/30/2021: This is a 61 year old right-handed man with a history of OSA, SVT, depression, PTSD, presenting for evaluation of need for Depakote started after an episode of loss of consciousness with shaking on 02/28/2021. He was in his usual state of health until 02/28/21 when his wife heard a thud. She heard him make a noise and found him between the tub and toilet. He was staring with legs and arms jerking, he kept biting his lip and tongue. This lasted for several minutes, then he was confused after, unable to answer questions. His last recollection is getting ready for bed, he does not recall being in the bathroom, then came to in the ambulance. He had bitten his tongue/lip and had bowel incontinence. His wife reports BP was fine and glucose was a little low. He was brought  to the [facility] ER where bloodwork was overall normal, glucose 167. TSH 7.291. UDS and EtOH negative. EKG and head CT without contrast were normal. Symptoms felt due to vasovagal syncope. On his follow-up with his PCP a few days later, he was started on Depakote for possible seizure. He was seen by a neurologist in [place] and reportedly had a normal EEG, report unavailable for review. He denies any prior history of seizures. He and his wife deny any other staring/unresponsive episodes, gaps in time, olfactory/gustatory hallucinations, deja vu, rising epigastric sensation, focal numbness/tingling/weakness, myoclonic jerks. He gets dizzy when standing up quickly. No headaches, diplopia, dysarthria/dysphagia, neck/back pain, bowel/bladder dysfunction. He gets occasional stomach pains due to GERD. No recent medication changes. He gets 8-8.5 hours of sleep with his CPAP, no sleep deprivation. He has prn lorazepam usually taken once a day depending on stress level, this was started after the seizure. Prior to the he was on Buspar. Mood is pretty good, he takes Seroquel. 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 He had a normal birth and early development.  There is no history of febrile convulsions, CNS infections such as meningitis/encephalitis, significant traumatic brain injury, neurosurgical procedures, or family history of seizures.   PAST MEDICAL HISTORY: Past Medical History:  Diagnosis Date   Anxiety    Arthritis    Depression    GERD (gastroesophageal reflux disease)    History of kidney stones    Seizure ([place]) 02/2021   Sleep apnea    wears CPAP     MEDICATIONS: Current  Outpatient Medications on File Prior to Visit  Medication Sig Dispense Refill   Cholecalciferol (VITAMIN D3) 125 MCG (5000 UT) TABS Take 5,000 Units by mouth in the morning.     clobetasol ointment (TEMOVATE) 5.18 % Apply 1 Application topically 2 (two) times daily. 60 g 0   Cyanocobalamin  (VITAMIN B12 PO) Take 1 tablet by mouth every other day.     DULoxetine (CYMBALTA) 60 MG capsule Take 60 mg by mouth daily.     Ferrous Sulfate (IRON PO) Take 55 mg by mouth at bedtime.     LORazepam (ATIVAN) 1 MG tablet Take 1 mg by mouth 3 (three) times daily as needed for anxiety.     metoprolol succinate (TOPROL XL) 25 MG 24 hr tablet Take 1 tablet (25 mg total) by mouth daily. 90 tablet 2   MISC NATURAL PRODUCTS PO Take 1 tablet by mouth 3 (three) times daily. Golo Release Dietary Supplement     zolpidem (AMBIEN) 10 MG tablet Take 10 mg by mouth at bedtime.     omeprazole (PRILOSEC) 20 MG capsule Take 20 mg by mouth in the morning. (Patient not taking: Reported on 10/29/2021)     PARoxetine (PAXIL) 30 MG tablet Take 30 mg by mouth in the morning. (Patient not taking: Reported on 10/29/2021)     QUEtiapine (SEROQUEL) 300 MG tablet Take 300 mg by mouth at bedtime. (Patient not taking: Reported on 10/29/2021)     No current facility-administered medications on file prior to visit.    ALLERGIES: Allergies  Allergen Reactions   Codeine Itching, Rash and Other (See Comments)    FAMILY HISTORY: Family History  Problem Relation Age of Onset   Heart disease Father 47   Kidney failure Father    Colon cancer Neg Hx     SOCIAL HISTORY: Social History   Socioeconomic History   Marital status: Married    Spouse name: Not on file   Number of children: Not on file   Years of education: Not on file   Highest education level: Not on file  Occupational History   Occupation: Environmental manager  Tobacco Use   Smoking status: Former    Packs/day: 0.50    Years: 10.00    Total pack years: 5.00    Types: Cigarettes    Quit date: 05/20/1996    Years since quitting: 25.4   Smokeless tobacco: Never  Vaping Use   Vaping Use: Never used  Substance and Sexual Activity   Alcohol use: Never   Drug use: Yes    Types: Marijuana    Comment: 1 or 2 x weeks   Sexual activity: Not on file   Other Topics Concern   Not on file  Social History Narrative   Right handed    Married wife   One story home   Social Determinants of Health   Financial Resource Strain: Not on file  Food Insecurity: Not on file  Transportation Needs: Not on file  Physical Activity: Not on file  Stress: Not on file  Social Connections: Not on file  Intimate Partner Violence: Not on file     PHYSICAL EXAM: Vitals:   10/29/21 0817  BP: 128/81  Pulse: 62  SpO2: 96%   General: No acute distress Head:  Normocephalic/atraumatic Skin/Extremities: No rash, no edema Neurological Exam: alert and awake. No aphasia or dysarthria. Fund of knowledge is appropriate.  Attention and concentration are normal.   Cranial nerves: Pupils equal, round. Extraocular movements intact with  no nystagmus. Visual fields full.  No facial asymmetry.  Motor: Bulk and tone normal, muscle strength 5/5 throughout with no pronator drift.   Finger to nose testing intact.  Gait narrow-based and steady, able to tandem walk adequately.  Romberg negative.   IMPRESSION: This is a 61 yo RH man with a history of OSA, SVT, depression, PTSD, who was started on Depakote after an episode of loss of consciousness with shaking on 02/28/2021. Etiology unclear, convulsive syncope versus seizure. His neurological exam is normal, brain MRI and 24-hour EEG normal. He has been off Depakote since March 2023 with normalization of platelet count and no further convulsions since 02/2021. We again discussed that after an initial seizure, unless there are significant risk factors, an abnormal neurological exam, an EEG showing epileptiform abnormalities, and/or abnormal neuroimaging, treatment with an antiepileptic drug is not indicated. We discussed 10% of the population may have a single seizure. Patients with a single unprovoked seizure have a recurrence rate of 33% after a single seizure and 73% after a second seizure. He knows to call our office for any  change in symptoms. We discussed avoidance of seizure triggers, he will discuss sleep dysfunction with his PCP. Follow-up as needed, call for any changes.    Thank you for allowing me to participate in his care.  Please do not hesitate to call for any questions or concerns.   Ellouise Newer, M.D.   CC: Garey Ham, NP
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
## 2021-12-04 ENCOUNTER — Ambulatory Visit (INDEPENDENT_AMBULATORY_CARE_PROVIDER_SITE_OTHER): Payer: No Typology Code available for payment source

## 2021-12-04 ENCOUNTER — Encounter: Payer: Self-pay | Admitting: Emergency Medicine

## 2021-12-04 ENCOUNTER — Ambulatory Visit
Admission: EM | Admit: 2021-12-04 | Discharge: 2021-12-04 | Disposition: A | Discharge status: Home / Self Care | Payer: No Typology Code available for payment source | Attending: Family Medicine | Admitting: Family Medicine

## 2021-12-04 ENCOUNTER — Other Ambulatory Visit: Payer: Self-pay

## 2021-12-04 DIAGNOSIS — Z23 Encounter for immunization: Secondary | ICD-10-CM | POA: Diagnosis not present

## 2021-12-04 DIAGNOSIS — R600 Localized edema: Secondary | ICD-10-CM | POA: Diagnosis not present

## 2021-12-04 DIAGNOSIS — S60512A Abrasion of left hand, initial encounter: Secondary | ICD-10-CM | POA: Diagnosis not present

## 2021-12-04 DIAGNOSIS — M7989 Other specified soft tissue disorders: Secondary | ICD-10-CM | POA: Diagnosis not present

## 2021-12-04 DIAGNOSIS — T148XXA Other injury of unspecified body region, initial encounter: Secondary | ICD-10-CM

## 2021-12-04 DIAGNOSIS — M79642 Pain in left hand: Secondary | ICD-10-CM

## 2021-12-04 DIAGNOSIS — M79632 Pain in left forearm: Secondary | ICD-10-CM | POA: Diagnosis not present

## 2021-12-04 DIAGNOSIS — S50812A Abrasion of left forearm, initial encounter: Secondary | ICD-10-CM | POA: Diagnosis not present

## 2021-12-04 MED ORDER — CHLORHEXIDINE GLUCONATE 4 % EX LIQD: Freq: Every day | CUTANEOUS | 0 refills | Status: DC | PRN

## 2021-12-04 MED ORDER — TETANUS-DIPHTH-ACELL PERTUSSIS 5-2.5-18.5 LF-MCG/0.5 IM SUSY
0.5000 mL | PREFILLED_SYRINGE | Freq: Once | INTRAMUSCULAR | Status: AC
  Administered 2021-12-04: 0.5 mL via INTRAMUSCULAR

## 2021-12-04 MED ORDER — MUPIROCIN 2 % EX OINT: 1.0000 | TOPICAL_OINTMENT | Freq: Two times a day (BID) | CUTANEOUS | 0 refills | Status: DC
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
## 2021-12-04 NOTE — Discharge Instructions (Signed)
Clean the wounds with Hibiclens and apply mupirocin ointment daily, keep the area covered with a nonstick bandage until fully healed over.  If you notice worsening redness, swelling, thick yellow drainage, fevers, worsening pain follow-up for recheck of the areas.  We have updated your tetanus shot today which is good for 5 years to cover injury, 10 years for screening schedule.  Your x-rays today were negative, great news

## 2021-12-07 NOTE — ED Provider Notes (Signed)
[facility]    CSN: [id number] Arrival date & time: 12/04/21  0809      History   Chief Complaint Chief Complaint  Patient presents with   Arm Pain    HPI William Farley is a 61 y.o. male.   Patient presenting today with left forearm pain and left hand pain since yesterday when a bush hog flipped over onto him and landed on his left side.  He states he has some scrapes and bruises to the left upper extremity and back but the majority of his pain and swelling is to the dorsal left hand and mid left forearm.  Range of motion is intact, has been cleaning the wound and keeping them covered for the most part.  Denies numbness, tingling, weakness.  Does not recall his last tetanus shot.    Past Medical History:  Diagnosis Date   Anxiety    Arthritis    Depression    GERD (gastroesophageal reflux disease)    History of kidney stones    Seizure ([place]) 02/2021   Sleep apnea    wears CPAP     There are no problems to display for this patient.   Past Surgical History:  Procedure Laterality Date   APPENDECTOMY     BIOPSY  05/01/2021   Procedure: BIOPSY;  Surgeon: Rogene Houston, MD;  [facility];  Service: Endoscopy;;   CERVICAL SPINE SURGERY  2018   CHOLECYSTECTOMY     COLONOSCOPY WITH PROPOFOL N/A 05/01/2021   Procedure: COLONOSCOPY WITH PROPOFOL;  Surgeon: Rogene Houston, MD;  [facility];  Service: Endoscopy;  Laterality: N/A;  1050   CYST EXCISION     CYSTOSCOPY     CYSTOSCOPY WITH RETROGRADE PYELOGRAM, URETEROSCOPY AND STENT PLACEMENT Right 08/08/2020   Procedure: CYSTOSCOPY WITH RIGHT RETROGRADE PYELOGRAM AND RIGHT URETEROSCOPY;  Surgeon: Cleon Gustin, MD;  Location: [facility];  Service: Urology;  Laterality: Right;   EXTRACORPOREAL SHOCK WAVE LITHOTRIPSY Right 05/21/2020   Procedure: EXTRACORPOREAL SHOCK WAVE LITHOTRIPSY (ESWL);  Surgeon: Cleon Gustin, MD;  Location: [facility];  Service: Urology;  Laterality: Right;   GASTRIC  BYPASS OPEN  2003   INCISIONAL HERNIA REPAIR     kidney stone removal     KNEE ARTHROSCOPY Left    multiple   NECK SURGERY     POLYPECTOMY  05/01/2021   Procedure: POLYPECTOMY;  Surgeon: Rogene Houston, MD;  [facility];  Service: Endoscopy;;   REPLACEMENT TOTAL KNEE Right    RHINOPLASTY     STONE EXTRACTION WITH BASKET Right 08/08/2020   Procedure: STONE EXTRACTION WITH BASKET;  Surgeon: Cleon Gustin, MD;  Location: [facility];  Service: Urology;  Laterality: Right;       Home Medications    Prior to Admission medications   Medication Sig Start Date End Date Taking? Authorizing Provider  chlorhexidine (HIBICLENS) 4 % external liquid Apply topically daily as needed. 12/04/21  Yes Volney American, PA-C  Cholecalciferol (VITAMIN D3) 125 MCG (5000 UT) TABS Take 2,500 Units by mouth in the morning.   Yes [provider]  Cyanocobalamin (VITAMIN B12 PO) Take 1 tablet by mouth every other day.   Yes [provider]  DULoxetine (CYMBALTA) 60 MG capsule Take 60 mg by mouth daily.   Yes [provider]  Ferrous Sulfate (IRON PO) Take 55 mg by mouth at bedtime.   Yes [provider]  LORazepam (ATIVAN) 1 MG tablet Take  1 mg by mouth 3 (three) times daily as needed for anxiety. 04/17/21  Yes [provider]  metoprolol succinate (TOPROL XL) 25 MG 24 hr tablet Take 1 tablet (25 mg total) by mouth daily. 04/21/21  Yes O'Neal, Cassie Freer, MD  MISC NATURAL PRODUCTS PO Take 1 tablet by mouth 3 (three) times daily. Golo Release Dietary Supplement   Yes [provider]  mupirocin ointment (BACTROBAN) 2 % Apply 1 Application topically 2 (two) times daily. 12/04/21  Yes Volney American, PA-C  omeprazole (PRILOSEC) 20 MG capsule Take 20 mg by mouth in the morning. 04/11/20  Yes [provider]  zolpidem (AMBIEN) 10 MG tablet Take 10 mg by mouth at bedtime.   Yes [provider]  clobetasol ointment (TEMOVATE)  7.20 % Apply 1 Application topically 2 (two) times daily. 10/23/21   Volney American, PA-C    Family History Family History  Problem Relation Age of Onset   Heart disease Father 80   Kidney failure Father    Colon cancer Neg Hx     Social History Social History   Tobacco Use   Smoking status: Former    Packs/day: 0.50    Years: 10.00    Total pack years: 5.00    Types: Cigarettes    Quit date: 05/20/1996    Years since quitting: 25.5   Smokeless tobacco: Never  Vaping Use   Vaping Use: Never used  Substance Use Topics   Alcohol use: Never   Drug use: Yes    Types: Marijuana    Comment: 1 or 2 x weeks     Allergies   Codeine   Review of Systems Review of Systems Per HPI  Physical Exam Triage Vital Signs ED Triage Vitals [12/04/21 0822]  Enc Vitals Group     BP 123/78     Pulse Rate 68     Resp 20     Temp 98.1 F (36.7 C)     Temp Source Oral     SpO2 93 %     Weight      Height      Head Circumference      Peak Flow      Pain Score 6     Pain Loc      Pain Edu?      Excl. in [place]?    No data found.  Updated Vital Signs BP 123/78 (BP Location: Right Arm)   Pulse 68   Temp 98.1 F (36.7 C) (Oral)   Resp 20   SpO2 93%   Visual Acuity Right Eye Distance:   Left Eye Distance:   Bilateral Distance:    Right Eye Near:   Left Eye Near:    Bilateral Near:     Physical Exam Vitals and nursing note reviewed.  Constitutional:      Appearance: Normal appearance.  HENT:     Head: Atraumatic.  Eyes:     Extraocular Movements: Extraocular movements intact.     Conjunctiva/sclera: Conjunctivae normal.  Cardiovascular:     Rate and Rhythm: Normal rate and regular rhythm.  Pulmonary:     Effort: Pulmonary effort is normal.     Breath sounds: Normal breath sounds.  Musculoskeletal:        General: Swelling, tenderness and signs of injury present. No deformity. Normal range of motion.     Cervical back: Normal range of motion and neck  supple.     Comments: Range of motion full and  intact left upper extremity diffusely, no bony deformities palpable to left hand or left forearm  Skin:    General: Skin is warm and dry.     Comments: Scabbed abrasions to dorsal left hand and left mid forearm with surrounding edema, slight bruising and tenderness to palpation.  Neurological:     General: No focal deficit present.     Mental Status: He is oriented to person, place, and time.     Comments: Left upper extremity neurovascularly intact  Psychiatric:        Mood and Affect: Mood normal.        Thought Content: Thought content normal.        Judgment: Judgment normal.      UC Treatments / Results  Labs (all labs ordered are listed, but only abnormal results are displayed) Labs Reviewed - No data to display  EKG   Radiology No results found.  Procedures Procedures (including critical care time)  Medications Ordered in UC Medications  Tdap (BOOSTRIX) injection 0.5 mL (0.5 mLs Intramuscular Given 12/04/21 0918)    Initial Impression / Assessment and Plan / UC Course  I have reviewed the triage vital signs and the nursing notes.  Pertinent labs & imaging results that were available during my care of the patient were reviewed by me and considered in my medical decision making (see chart for details).     Vitals and exam overall reassuring today with no signs of distress.  His x-rays of the left hand and forearm were negative for acute bony abnormality, wounds were cleaned and dressed, Hibiclens and mupirocin were sent for home wound care.  Tdap was updated as last date was unknown.  Return for any worsening symptoms.  Final Clinical Impressions(s) / UC Diagnoses   Final diagnoses:  Left hand pain  Left forearm pain  Abrasion of skin     Discharge Instructions      Clean the wounds with Hibiclens and apply mupirocin ointment daily, keep the area covered with a nonstick bandage until fully healed over.  If you  notice worsening redness, swelling, thick yellow drainage, fevers, worsening pain follow-up for recheck of the areas.  We have updated your tetanus shot today which is good for 5 years to cover injury, 10 years for screening schedule.  Your x-rays today were negative, great news    ED Prescriptions     Medication Sig Dispense Auth. Provider   chlorhexidine (HIBICLENS) 4 % external liquid Apply topically daily as needed. 120 mL Volney American, PA-C   mupirocin ointment (BACTROBAN) 2 % Apply 1 Application topically 2 (two) times daily. 22 g Volney American, [place]      PDMP not reviewed this encounter.   Volney American, [place] 12/07/21 1250

## 2022-01-08 ENCOUNTER — Ambulatory Visit (INDEPENDENT_AMBULATORY_CARE_PROVIDER_SITE_OTHER): Payer: No Typology Code available for payment source

## 2022-01-08 ENCOUNTER — Encounter: Payer: Self-pay | Admitting: Emergency Medicine

## 2022-01-08 ENCOUNTER — Ambulatory Visit
Admission: EM | Admit: 2022-01-08 | Discharge: 2022-01-08 | Disposition: A | Discharge status: Home / Self Care | Payer: Federal, State, Local not specified - PPO | Attending: Nurse Practitioner | Admitting: Nurse Practitioner

## 2022-01-08 DIAGNOSIS — M25462 Effusion, left knee: Secondary | ICD-10-CM

## 2022-01-08 DIAGNOSIS — M25562 Pain in left knee: Secondary | ICD-10-CM

## 2022-01-08 MED ORDER — IBUPROFEN 800 MG PO TABS: 800.0000 mg | ORAL_TABLET | Freq: Three times a day (TID) | ORAL | 0 refills | Status: DC | PRN

## 2022-01-08 NOTE — ED Provider Notes (Signed)
[facility]    CSN: [id number] Arrival date & time: 01/08/22  1738      History   Chief Complaint No chief complaint on file.   HPI William Farley is a 61 y.o. male.   The history is provided by the patient.   Patient presents with a 2-day history of left knee pain.  Patient is uncertain of any known injury or trauma, but states that he has had swelling, pain, and bruising to the left knee.  Pain is located to the medial aspect of the left knee and behind the medial left knee.  Pain worsens with weightbearing.  Patient states that he has taken over-the-counter analgesics for pain.  He has not applied ice.  States he has had several arthroscopies on the left knee.  Patient states "I tore my meniscus". Past Medical History:  Diagnosis Date   Anxiety    Arthritis    Depression    GERD (gastroesophageal reflux disease)    History of kidney stones    Seizure ([place]) 02/2021   Sleep apnea    wears CPAP     There are no problems to display for this patient.   Past Surgical History:  Procedure Laterality Date   APPENDECTOMY     BIOPSY  05/01/2021   Procedure: BIOPSY;  Surgeon: Rogene Houston, MD;  [facility];  Service: Endoscopy;;   CERVICAL SPINE SURGERY  2018   CHOLECYSTECTOMY     COLONOSCOPY WITH PROPOFOL N/A 05/01/2021   Procedure: COLONOSCOPY WITH PROPOFOL;  Surgeon: Rogene Houston, MD;  [facility];  Service: Endoscopy;  Laterality: N/A;  1050   CYST EXCISION     CYSTOSCOPY     CYSTOSCOPY WITH RETROGRADE PYELOGRAM, URETEROSCOPY AND STENT PLACEMENT Right 08/08/2020   Procedure: CYSTOSCOPY WITH RIGHT RETROGRADE PYELOGRAM AND RIGHT URETEROSCOPY;  Surgeon: Cleon Gustin, MD;  [facility];  Service: Urology;  Laterality: Right;   EXTRACORPOREAL SHOCK WAVE LITHOTRIPSY Right 05/21/2020   Procedure: EXTRACORPOREAL SHOCK WAVE LITHOTRIPSY (ESWL);  Surgeon: Cleon Gustin, MD;  Location: [facility];  Service: Urology;  Laterality:  Right;   GASTRIC BYPASS OPEN  2003   INCISIONAL HERNIA REPAIR     kidney stone removal     KNEE ARTHROSCOPY Left    multiple   NECK SURGERY     POLYPECTOMY  05/01/2021   Procedure: POLYPECTOMY;  Surgeon: Rogene Houston, MD;  [facility];  Service: Endoscopy;;   REPLACEMENT TOTAL KNEE Right    RHINOPLASTY     STONE EXTRACTION WITH BASKET Right 08/08/2020   Procedure: STONE EXTRACTION WITH BASKET;  Surgeon: Cleon Gustin, MD;  Location: [facility];  Service: Urology;  Laterality: Right;       Home Medications    Prior to Admission medications   Medication Sig Start Date End Date Taking? Authorizing Provider  ibuprofen (ADVIL) 800 MG tablet Take 1 tablet (800 mg total) by mouth every 8 (eight) hours as needed. 01/08/22  Yes Brigida Scotti-Warren, Alda Lea, NP  chlorhexidine (HIBICLENS) 4 % external liquid Apply topically daily as needed. 12/04/21   Volney American, PA-C  Cholecalciferol (VITAMIN D3) 125 MCG (5000 UT) TABS Take 2,500 Units by mouth in the morning.    [provider]  clobetasol ointment (TEMOVATE) 7.84 % Apply 1 Application topically 2 (two) times daily. 10/23/21   Volney American, PA-C  Cyanocobalamin (VITAMIN B12 PO) Take 1 tablet by mouth every other day.  [provider]  DULoxetine (CYMBALTA) 60 MG capsule Take 60 mg by mouth daily.    [provider]  Ferrous Sulfate (IRON PO) Take 55 mg by mouth at bedtime.    [provider]  LORazepam (ATIVAN) 1 MG tablet Take 1 mg by mouth 3 (three) times daily as needed for anxiety. 04/17/21   [provider]  metoprolol succinate (TOPROL XL) 25 MG 24 hr tablet Take 1 tablet (25 mg total) by mouth daily. 04/21/21   O'NealCassie Freer, MD  MISC NATURAL PRODUCTS PO Take 1 tablet by mouth 3 (three) times daily. Golo Release Dietary Supplement    [provider]  mupirocin ointment (BACTROBAN) 2 % Apply 1 Application topically 2 (two) times daily. 12/04/21    Volney American, PA-C  omeprazole (PRILOSEC) 20 MG capsule Take 20 mg by mouth in the morning. 04/11/20   [provider]  zolpidem (AMBIEN) 10 MG tablet Take 10 mg by mouth at bedtime.    [provider]    Family History Family History  Problem Relation Age of Onset   Heart disease Father 110   Kidney failure Father    Colon cancer Neg Hx     Social History Social History   Tobacco Use   Smoking status: Former    Packs/day: 0.50    Years: 10.00    Total pack years: 5.00    Types: Cigarettes    Quit date: 05/20/1996    Years since quitting: 25.6   Smokeless tobacco: Never  Vaping Use   Vaping Use: Never used  Substance Use Topics   Alcohol use: Never   Drug use: Yes    Types: Marijuana    Comment: 1 or 2 x weeks     Allergies   Codeine   Review of Systems Review of Systems Per HPI  Physical Exam Triage Vital Signs ED Triage Vitals  Enc Vitals Group     BP 01/08/22 1748 132/81     Pulse Rate 01/08/22 1748 88     Resp 01/08/22 1748 18     Temp 01/08/22 1748 98 F (36.7 C)     Temp Source 01/08/22 1748 Oral     SpO2 01/08/22 1748 95 %     Weight --      Height --      Head Circumference --      Peak Flow --      Pain Score 01/08/22 1749 8     Pain Loc --      Pain Edu? --      Excl. in [place]? --    No data found.  Updated Vital Signs BP 132/81 (BP Location: Right Arm)   Pulse 88   Temp 98 F (36.7 C) (Oral)   Resp 18   SpO2 95%   Visual Acuity Right Eye Distance:   Left Eye Distance:   Bilateral Distance:    Right Eye Near:   Left Eye Near:    Bilateral Near:     Physical Exam Vitals and nursing note reviewed.  Constitutional:      General: He is not in acute distress.    Appearance: Normal appearance.  Eyes:     Extraocular Movements: Extraocular movements intact.     Pupils: Pupils are equal, round, and reactive to light.  Pulmonary:     Effort: Pulmonary effort is normal.  Musculoskeletal:     Cervical  back: Normal range of motion.     Left  knee: Swelling and ecchymosis present. Decreased range of motion. Tenderness present over the medial joint line. Normal pulse.  Neurological:     General: No focal deficit present.     Mental Status: He is alert and oriented to person, place, and time.  Psychiatric:        Mood and Affect: Mood normal.        Behavior: Behavior normal.      UC Treatments / Results  Labs (all labs ordered are listed, but only abnormal results are displayed) Labs Reviewed - No data to display  EKG   Radiology DG Knee Complete 4 Views Left  Result Date: 01/08/2022 CLINICAL DATA:  Left knee pain. EXAM: LEFT KNEE - COMPLETE 4+ VIEW COMPARISON:  None Available. FINDINGS: There is no acute fracture or dislocation. The bones are osteopenic. 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 There is a moderate size suprapatellar effusion. The soft tissues are unremarkable. IMPRESSION: 1. No acute fracture or dislocation. 2. Moderate size suprapatellar effusion. 3. Osteopenia. Electronically Signed   By: Anner Crete M.D.   On: 01/08/2022 18:09    Procedures Procedures (including critical care time)  Medications Ordered in UC Medications - No data to display  Initial Impression / Assessment and Plan / UC Course  I have reviewed the triage vital signs and the nursing notes.  Pertinent labs & imaging results that were available during my care of the patient were reviewed by me and considered in my medical decision making (see chart for details).  Patient presents with a 2-day history of left knee pain.  On exam, patient is well-appearing, he is in no acute distress, vital signs are stable.  X-ray shows a moderate suprapatella effusion of the left knee along with osteopenia.  There is no fracture or dislocation.  Difficult to determine the cause of the patient's symptoms as he denies any obvious injury or  trauma.  An Ace wrap was provided for compression and support.  If so supportive care recommendations were provided to the patient to include ibuprofen 353 mg and the application of ice.  Patient was advised to follow-up with orthopedics within the next 48 to 72 hours for reevaluation, nonweightbearing until seen by orthopedics.  Patient verbalizes understanding.  All questions were answered.  Patient is stable for discharge. Final Clinical Impressions(s) / UC Diagnoses   Final diagnoses:  Knee effusion, left  Left knee pain, unspecified chronicity     Discharge Instructions      The x-ray does not show any fracture or dislocation.  It does show moderate fluid on the knee along with some breakdown of the bone. Wear the Ace wrap until you are seen by orthopedics. Recommend nonweightbearing until you have been evaluated further. Take medication as prescribed. Apply ice to the left knee to help with swelling.  Apply for 20 minutes, remove for 1 hour, then repeat is much as possible. You can follow-up with [facility] [place] at [phone] or with [facility] at [phone]. Follow-up as needed.     ED Prescriptions     Medication Sig Dispense Auth. Provider   ibuprofen (ADVIL) 800 MG tablet Take 1 tablet (800 mg total) by mouth every 8 (eight) hours as needed. 30 tablet Monasia Lair-Warren, Alda Lea, NP      PDMP not reviewed this encounter.   Tish Men, NP 01/08/22 1832

## 2022-01-08 NOTE — Discharge Instructions (Addendum)
The x-ray does not show any fracture or dislocation.  It does show moderate fluid on the knee along with some breakdown of the bone. Wear the Ace wrap until you are seen by orthopedics. Recommend nonweightbearing until you have been evaluated further. Take medication as prescribed. Apply ice to the left knee to help with swelling.  Apply for 20 minutes, remove for 1 hour, then repeat is much as possible. You can follow-up with [facility] [place] at [phone] or with [facility] at [phone]. Follow-up as needed.

## 2022-01-08 NOTE — ED Triage Notes (Signed)
Left knee pain x 2 days.  Bruising to inside of left knee.  No known injury

## 2022-01-13 ENCOUNTER — Ambulatory Visit: Payer: No Typology Code available for payment source | Admitting: Orthopaedic Surgery

## 2022-01-13 ENCOUNTER — Encounter: Payer: Self-pay | Admitting: Orthopaedic Surgery

## 2022-01-13 VITALS — BP 135/83 | HR 62 | Ht 71.0 in | Wt 263.0 lb

## 2022-01-13 DIAGNOSIS — S76302A Unspecified injury of muscle, fascia and tendon of the posterior muscle group at thigh level, left thigh, initial encounter: Secondary | ICD-10-CM | POA: Diagnosis not present

## 2022-01-13 DIAGNOSIS — M25562 Pain in left knee: Secondary | ICD-10-CM | POA: Diagnosis not present

## 2022-01-13 DIAGNOSIS — G8929 Other chronic pain: Secondary | ICD-10-CM | POA: Diagnosis not present

## 2022-01-13 MED ORDER — METHYLPREDNISOLONE ACETATE 40 MG/ML IJ SUSP
40.0000 mg | Freq: Once | INTRAMUSCULAR | Status: AC
  Administered 2022-01-13: 40 mg via INTRA_ARTICULAR

## 2022-01-13 NOTE — Patient Instructions (Addendum)
**  OOW note until next visit**  Aspercreme, Biofreeze, Blue Emu or Voltaren Gel over the counter 2-3 times daily. Rub into area well each use for best results.  Use ice and heat.   This will take a few weeks to heal.   Put in the back of your mind that you may need a total knee replacement on this knee.

## 2022-01-13 NOTE — Progress Notes (Signed)
Subjective:    Patient ID: William Farley, male    DOB: 27-Nov-1960, 61 y.o.   MRN: [id number]  HPI He has long history of left knee pain over the years.  He has had four different arthroscopies of the left knee.  He has had total knee on the right about eight years ago and has done well.  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  He has to climb ladders and crawl about at times.  He has developed pain of the left knee about ten days ago with swelling and more pain medially and posteriorly with bruising in the area.  He denies any trauma but says he has a very high pain tolerance.  He is tired of the left knee hurting and not getting better.  He takes ibuprofen 800 two to three times a day.  It helps some.   Review of Systems  Constitutional:  Positive for activity change.  Musculoskeletal:  Positive for arthralgias, gait problem, joint swelling and myalgias.  All other systems reviewed and are negative. For Review of Systems, all other systems reviewed and are negative.  The following is a summary of the past history medically, past history surgically, known current medicines, social history and family history.  This information is gathered electronically by the computer from prior information and documentation.  I review this each visit and have found including this information at this point in the chart is beneficial and informative.   Past Medical History:  Diagnosis Date   Anxiety    Arthritis    Depression    GERD (gastroesophageal reflux disease)    History of kidney stones    Seizure ([place]) 02/2021   Sleep apnea    wears CPAP     Past Surgical History:  Procedure Laterality Date   APPENDECTOMY     BIOPSY  05/01/2021   Procedure: BIOPSY;  Surgeon: Rogene Houston, MD;  Location: [facility];  Service: Endoscopy;;   CERVICAL SPINE SURGERY  2018   CHOLECYSTECTOMY     COLONOSCOPY  WITH PROPOFOL N/A 05/01/2021   Procedure: COLONOSCOPY WITH PROPOFOL;  Surgeon: Rogene Houston, MD;  Location: [facility];  Service: Endoscopy;  Laterality: N/A;  1050   CYST EXCISION     CYSTOSCOPY     CYSTOSCOPY WITH RETROGRADE PYELOGRAM, URETEROSCOPY AND STENT PLACEMENT Right 08/08/2020   Procedure: CYSTOSCOPY WITH RIGHT RETROGRADE PYELOGRAM AND RIGHT URETEROSCOPY;  Surgeon: Cleon Gustin, MD;  Location: [facility];  Service: Urology;  Laterality: Right;   EXTRACORPOREAL SHOCK WAVE LITHOTRIPSY Right 05/21/2020   Procedure: EXTRACORPOREAL SHOCK WAVE LITHOTRIPSY (ESWL);  Surgeon: Cleon Gustin, MD;  Location: [facility];  Service: Urology;  Laterality: Right;   GASTRIC BYPASS OPEN  2003   INCISIONAL HERNIA REPAIR     kidney stone removal     KNEE ARTHROSCOPY Left    multiple   NECK SURGERY     POLYPECTOMY  05/01/2021   Procedure: POLYPECTOMY;  Surgeon: Rogene Houston, MD;  [facility];  Service: Endoscopy;;   REPLACEMENT TOTAL KNEE Right    RHINOPLASTY     STONE EXTRACTION WITH BASKET Right 08/08/2020   Procedure: STONE EXTRACTION WITH BASKET;  Surgeon: Cleon Gustin, MD;  Location: [facility];  Service: Urology;  Laterality: Right;    Current Outpatient Medications on File Prior to Visit  Medication Sig Dispense  Refill   chlorhexidine (HIBICLENS) 4 % external liquid Apply topically daily as needed. 120 mL 0   Cholecalciferol (VITAMIN D3) 125 MCG (5000 UT) TABS Take 2,500 Units by mouth in the morning.     clobetasol ointment (TEMOVATE) 6.78 % Apply 1 Application topically 2 (two) times daily. 60 g 0   Cyanocobalamin (VITAMIN B12 PO) Take 1 tablet by mouth every other day.     DULoxetine (CYMBALTA) 60 MG capsule Take 60 mg by mouth daily.     Ferrous Sulfate (IRON PO) Take 55 mg by mouth at bedtime.     ibuprofen (ADVIL) 800 MG tablet Take 1 tablet (800 mg total) by mouth every 8 (eight) hours as needed. 30 tablet 0   LORazepam (ATIVAN) 1 MG tablet Take 1 mg by mouth 3  (three) times daily as needed for anxiety.     metoprolol succinate (TOPROL XL) 25 MG 24 hr tablet Take 1 tablet (25 mg total) by mouth daily. 90 tablet 2   MISC NATURAL PRODUCTS PO Take 1 tablet by mouth 3 (three) times daily. Golo Release Dietary Supplement     mupirocin ointment (BACTROBAN) 2 % Apply 1 Application topically 2 (two) times daily. 22 g 0   omeprazole (PRILOSEC) 20 MG capsule Take 20 mg by mouth in the morning.     zolpidem (AMBIEN) 10 MG tablet Take 10 mg by mouth at bedtime.     No current facility-administered medications on file prior to visit.    Social History   Socioeconomic History   Marital status: Married    Spouse name: Not on file   Number of children: Not on file   Years of education: Not on file   Highest education level: Not on file  Occupational History   Occupation: Environmental manager  Tobacco Use   Smoking status: Former    Packs/day: 0.50    Years: 10.00    Total pack years: 5.00    Types: Cigarettes    Quit date: 05/20/1996    Years since quitting: 25.6   Smokeless tobacco: Never  Vaping Use   Vaping Use: Never used  Substance and Sexual Activity   Alcohol use: Never   Drug use: Yes    Types: Marijuana    Comment: 1 or 2 x weeks   Sexual activity: Not on file  Other Topics Concern   Not on file  Social History Narrative   Right handed    Married wife   One story home   Social Determinants of Health   Financial Resource Strain: Not on file  Food Insecurity: Not on file  Transportation Needs: Not on file  Physical Activity: Not on file  Stress: Not on file  Social Connections: Not on file  Intimate Partner Violence: Not on file    Family History  Problem Relation Age of Onset   Heart disease Father 34   Kidney failure Father    Colon cancer Neg Hx     BP 135/83   Pulse 62   Ht '5\' 11"'$  (1.803 m)   Wt 263 lb (119.3 kg)   BMI 36.68 kg/m   Body mass index is 36.68 kg/m.      Objective:   Physical Exam Vitals  and nursing note reviewed. Exam conducted with a chaperone present.  Constitutional:      Appearance: He is well-developed.  HENT:     Head: Normocephalic and atraumatic.  Eyes:     Conjunctiva/sclera: Conjunctivae normal.  Pupils: Pupils are equal, round, and reactive to light.  Cardiovascular:     Rate and Rhythm: Normal rate and regular rhythm.  Pulmonary:     Effort: Pulmonary effort is normal.  Abdominal:     Palpations: Abdomen is soft.  Musculoskeletal:     Cervical back: Normal range of motion and neck supple.       Legs:  Skin:    General: Skin is warm and dry.  Neurological:     Mental Status: He is alert and oriented to person, place, and time.     Cranial Nerves: No cranial nerve deficit.     Motor: No abnormal muscle tone.     Coordination: Coordination normal.     Deep Tendon Reflexes: Reflexes are normal and symmetric. Reflexes normal.  Psychiatric:        Behavior: Behavior normal.        Thought Content: Thought content normal.        Judgment: Judgment normal.   X-rays were done of the left knee, reported separately.        Assessment & Plan:   Encounter Diagnoses  Name Primary?   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  He is candidate for total knee.  He has had GI bypass surgery in the past and has had problems with his esophageus.  He cannot take NSAIDs.  PROCEDURE NOTE:  The patient requests injections of the left knee , verbal consent was obtained.  The left knee was prepped appropriately after time out was performed.   Sterile technique was observed and injection of 1 cc of DepoMedrol 40 mg with several cc's of plain xylocaine. Anesthesia was provided by ethyl chloride and a 20-gauge needle was used to inject the knee area. The injection was tolerated well.  A band aid dressing was  applied.  The patient was advised to apply ice later today and tomorrow to the injection sight as needed.  He may need MRI of the left knee.  Continue the cane.  Use ice and rubs.  Return in two weeks.  Call if any problem.  Precautions discussed.  Electronically Signed Sanjuana Kava, MD 10/24/202311:42 AM

## 2022-01-14 ENCOUNTER — Encounter: Payer: Self-pay | Admitting: Orthopaedic Surgery

## 2022-01-16 ENCOUNTER — Telehealth: Payer: Self-pay | Admitting: Radiology

## 2022-01-16 NOTE — Telephone Encounter (Signed)
Patient came by for a knee brace  Unfortunately his thigh is quite a bit larger than his calf and the brace would not stay up on him  Can you please make addendum to your last note to indicate this?   CUSTOM KNEE BRACE patient has Instability with functional testing, Quad to calf ratio mismatch prevents off the shelf bracing  So I can send an order for him to have a custom knee brace?

## 2022-01-20 NOTE — Telephone Encounter (Signed)
He states he has appointment next week with you and wants to discuss further then, he is wanting to consider knee replacement at this time. I told him I would let you know and he can discuss with you a referral for someone to consider a knee replacement vs need for MRI

## 2022-01-27 ENCOUNTER — Ambulatory Visit: Payer: Federal, State, Local not specified - PPO | Admitting: Orthopaedic Surgery

## 2022-01-27 ENCOUNTER — Encounter: Payer: Self-pay | Admitting: Orthopaedic Surgery

## 2022-01-27 ENCOUNTER — Encounter: Payer: Self-pay | Admitting: Radiology

## 2022-01-27 DIAGNOSIS — G8929 Other chronic pain: Secondary | ICD-10-CM | POA: Diagnosis not present

## 2022-01-27 DIAGNOSIS — M25562 Pain in left knee: Secondary | ICD-10-CM

## 2022-01-27 MED ORDER — HYDROCODONE-ACETAMINOPHEN 5-325 MG PO TABS: ORAL_TABLET | ORAL | 0 refills | Status: DC

## 2022-01-27 MED ORDER — HYDROCODONE-ACETAMINOPHEN 5-325 MG PO TABS
ORAL_TABLET | ORAL | 0 refills | Status: DC
Start: 2022-01-27 — End: 2022-01-27

## 2022-01-27 NOTE — Patient Instructions (Signed)
While we are working on your approval please go ahead and call to schedule your appointment with [facility] today.We need at least 1 week to work on getting prior approval for your imaging. For example, if your appointment is scheduled for the first day of the month, we need you to make your imaging appointment for the 9th day of the month or later.  Central Scheduling [phone]  AFTER you have made your imaging appointment, please call our office back at [phone] to schedule an appointment to review your results. Your follow up appointment will need to be 3-4 days after your imaging is performed to allow time for radiology to read your imaging and for [place] to get the report. If you are having your imaging performed in a facility not associated with [facility], you will need to ask for a CD with your images on them.

## 2022-01-27 NOTE — Progress Notes (Signed)
My knee is worse.  His left knee is very painful.  His hamstring pain is less but he has medial pain of the knee now and swelling and giving way.  It is getting worse.  He has no new trauma.  He is using a cane.  He has had a knee replacement on the right in the past and also arthroscopy of this left knee in the past.  Left knee has some swelling, medial pain, crepitus, ROM 0 to 105 with pain.  Pain to full extension.  Positive medial McMurray is present.  NV intact.  He has some lower back pain on the right side as well.  Encounter Diagnosis  Name Primary?   Chronic pain of left knee Yes   I am concerned about possible meniscus tear of the left knee.  I will get MRI of the knee.  I will call in pain medicine.  I have reviewed the [place] web site prior to prescribing narcotic medicine for this patient.  Return in two weeks or earlier if MRI done earlier.  Call if any problem.  Precautions discussed.  Electronically Signed Sanjuana Kava, MD 11/7/20238:13 AM

## 2022-01-30 ENCOUNTER — Ambulatory Visit
Admission: RE | Admit: 2022-01-30 | Discharge: 2022-01-30 | Disposition: A | Discharge status: Home / Self Care | Payer: Federal, State, Local not specified - PPO | Source: Ambulatory Visit | Attending: Orthopaedic Surgery | Admitting: Orthopaedic Surgery

## 2022-01-30 DIAGNOSIS — G8929 Other chronic pain: Secondary | ICD-10-CM | POA: Diagnosis not present

## 2022-01-30 DIAGNOSIS — M25562 Pain in left knee: Secondary | ICD-10-CM | POA: Diagnosis not present

## 2022-01-30 DIAGNOSIS — M1712 Unilateral primary osteoarthritis, left knee: Secondary | ICD-10-CM | POA: Diagnosis not present

## 2022-01-30 DIAGNOSIS — S83232A Complex tear of medial meniscus, current injury, left knee, initial encounter: Secondary | ICD-10-CM | POA: Diagnosis not present

## 2022-01-30 DIAGNOSIS — M25462 Effusion, left knee: Secondary | ICD-10-CM | POA: Diagnosis not present

## 2022-02-03 ENCOUNTER — Encounter: Payer: Self-pay | Admitting: Orthopaedic Surgery

## 2022-02-03 ENCOUNTER — Ambulatory Visit (INDEPENDENT_AMBULATORY_CARE_PROVIDER_SITE_OTHER): Payer: No Typology Code available for payment source | Admitting: Orthopaedic Surgery

## 2022-02-03 VITALS — Ht 71.0 in | Wt 259.0 lb

## 2022-02-03 DIAGNOSIS — M25562 Pain in left knee: Secondary | ICD-10-CM

## 2022-02-03 DIAGNOSIS — G8929 Other chronic pain: Secondary | ICD-10-CM | POA: Diagnosis not present

## 2022-02-03 NOTE — Patient Instructions (Addendum)
We will get you scheduled to see Dr.Harrison to discuss treatment options for your LEFT knee. His assistant's name is Amy.   As the weather changes and gets cooler, you may notice you are affected more. You may have more pain in your joints. This is normal. Dress warmly and make sure that area is covered well.   HAVE A BLESSED AND HAPPY THANKSGIVING  LIGHT DUTY AT WORK: UNTIL HE SEES DR.HARRISON NO EXCESSIVE SQUATTING, CLIMBING LADDERS, STANDING FOR PROLONGED PERIODS OF TIME OR EXCESSIVE WALKING

## 2022-02-03 NOTE — Progress Notes (Signed)
My knee hurts.  He had MRI of the left knee showing: IMPRESSION: Severe medial compartment osteoarthritis with full-thickness cartilage loss along the weight-bearing surfaces, intense adjacent subchondral marrow edema with potential small subchondral insufficiency fractures, and complex degenerative tearing of the medial meniscal body with substance loss.   Moderate-sized joint effusion.   Intact cruciate and collateral ligaments.   I have explained the findings to him.  I have shown a model of the knee.  He has a total knee on the right and would like to get a total knee on the left.  I will have him see Dr. Aline Brochure.  I have independently reviewed the MRI.    Left knee has medial pain, swelling, crepitus, ROM 0 to 105 with medial pain, positive medial McMurray, NV intact, no distal edema.  Encounter Diagnosis  Name Primary?   Chronic pain of left knee Yes   To see Dr. Aline Brochure.  Note for limited duty given.  Call if any problem.  Precautions discussed.  Electronically Signed Sanjuana Kava, MD 11/14/20239:06 AM

## 2022-02-11 NOTE — Progress Notes (Unsigned)
A user error has taken place: {error:[id number]}.

## 2022-02-18 ENCOUNTER — Ambulatory Visit (INDEPENDENT_AMBULATORY_CARE_PROVIDER_SITE_OTHER): Payer: Federal, State, Local not specified - PPO

## 2022-02-18 ENCOUNTER — Other Ambulatory Visit: Payer: Self-pay | Admitting: Orthopedic Surgery

## 2022-02-18 ENCOUNTER — Ambulatory Visit: Payer: Federal, State, Local not specified - PPO | Admitting: Orthopedic Surgery

## 2022-02-18 ENCOUNTER — Encounter: Payer: Self-pay | Admitting: Cardiology

## 2022-02-18 VITALS — Ht 71.0 in | Wt 257.0 lb

## 2022-02-18 DIAGNOSIS — M25562 Pain in left knee: Secondary | ICD-10-CM

## 2022-02-18 DIAGNOSIS — G8929 Other chronic pain: Secondary | ICD-10-CM | POA: Diagnosis not present

## 2022-02-18 DIAGNOSIS — G894 Chronic pain syndrome: Secondary | ICD-10-CM | POA: Insufficient documentation

## 2022-02-18 DIAGNOSIS — G4700 Insomnia, unspecified: Secondary | ICD-10-CM | POA: Insufficient documentation

## 2022-02-18 DIAGNOSIS — R454 Irritability and anger: Secondary | ICD-10-CM | POA: Insufficient documentation

## 2022-02-18 DIAGNOSIS — F1320 Sedative, hypnotic or anxiolytic dependence, uncomplicated: Secondary | ICD-10-CM | POA: Insufficient documentation

## 2022-02-18 DIAGNOSIS — M1712 Unilateral primary osteoarthritis, left knee: Secondary | ICD-10-CM | POA: Diagnosis not present

## 2022-02-18 DIAGNOSIS — Z01818 Encounter for other preprocedural examination: Secondary | ICD-10-CM | POA: Diagnosis not present

## 2022-02-18 DIAGNOSIS — F515 Nightmare disorder: Secondary | ICD-10-CM | POA: Insufficient documentation

## 2022-02-18 DIAGNOSIS — F4312 Post-traumatic stress disorder, chronic: Secondary | ICD-10-CM | POA: Insufficient documentation

## 2022-02-18 MED ORDER — BUPIVACAINE-MELOXICAM ER 400-12 MG/14ML IJ SOLN: 400.0000 mg | Freq: Once | INTRAMUSCULAR | Status: DC

## 2022-02-18 NOTE — Progress Notes (Signed)
Chief Complaint  Patient presents with   Knee Pain    Left to discuss surgery    HPI: 61 year old male seen by Dr. Luna Glasgow referred to me for possible left total knee replacement  Patient is interested in having his knee replaced.  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 Domenic Polite.  He has had no other symptoms of that    Past Medical History:  Diagnosis Date   Anxiety    Arthritis    Depression    GERD (gastroesophageal reflux disease)    History of kidney stones    Seizure ([place]) 02/2021   Sleep apnea    wears CPAP     Ht '5\' 11"'$  (1.803 m)   Wt 257 lb (116.6 kg)   BMI 35.84 kg/m    General appearance: Well-developed well-nourished no gross deformities  Cardiovascular normal pulse and perfusion normal color without edema  Neurologically no sensation loss or deficits or pathologic reflexes  Psychological: Awake alert and oriented x3 mood and affect normal  Skin no lacerations or ulcerations no nodularity no palpable masses, no erythema or nodularity  Musculoskeletal: Right knee long surgical scar from his total knee healed well slight widening but nothing to be concerned.  Left knee has a transverse scar just off the medial side of the tibial tubercle from a saw injury.  I do not think it will affect our surgical incision or affect healing  He still has an excellent range of motion 0-1 25 knee feels stable he is tender medially laterally and around the patellofemoral joint.  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  The left knee is in 7 degrees of varus between the tibia and femur there are not a lot of osteophytes but he is bone-on-bone medially  A/P  Preop consults needed: Cardiology Dr Domenic Polite  The procedure has been fully reviewed with the patient; The risks and benefits of surgery have been discussed and explained and understood. Alternative treatment has also been reviewed, questions were encouraged and answered. The postoperative plan is also been reviewed.  House: steps only 1 step to get in the house Everything is on the first floor

## 2022-02-18 NOTE — Addendum Note (Signed)
Addended byCandice Camp on: 02/18/2022 04:25 PM   Modules accepted: Orders

## 2022-02-18 NOTE — Patient Instructions (Addendum)
Your surgery will be at [facility] by Dr Aline Brochure  plan to be in hospital overnight. Letter sent to Dr Domenic Polite, please contact his office regarding the clearance, in case the letter is not received appropriately  The hospital will contact you with a preoperative appointment to discuss Anesthesia.  Please arrive on time or 15 minutes early for the preoperative appointment, they have a very tight schedule if you are late or do not come in your surgery will be cancelled.  The phone number for the preop area is [phone]. Please bring your medications with you for the appointment. They will tell you the arrival time for surgery and medication instructions when you have your preoperative evaluation. Do not wear nail polish the day of your surgery and if you take Phentermine you need to stop this medication ONE WEEK prior to your surgery. f you take Augustina Mood, Jardiance, or Steglatro) - Hold 72 hours before the procedure.  If you take Ozempic,  Bydureon or Trulicity do not take for 8 days before your surgery. If you take Victoza, Rybelsis, Saxenda or Adlyxi stop 24 hours before the procedure. Please arrive at the hospital 2 hours before procedure if scheduled at 9:30 or later in the day or at the time the nurse tells you at your preoperative visit.   If you have my chart do not use the time given in my chart use the time given to you by the nurse during your preoperative visit.   Your surgery  time may change. Please be available for phone calls the day of your surgery and the day before. The Short Stay department may need to discuss changes about your surgery time. Not reaching the you could lead to procedure delays and possible cancellation.  You must have a ride home and someone to stay with you for 24 to 48 hours. The person taking you home will receive and sign for the your discharge instructions.  Please be prepared to give your support person's name and telephone number to Central  Registration. Dr Aline Brochure will need that name and phone number post procedure.   You will also get a call from a representative of [employer], they have a machine that you will use in the first few weeks after surgery. It is called a CPM.   You will have home physical therapy for 2 weeks after surgery, the home health agency will call you before or just following the surgery to set up visits. [facility] is the agency we normally use, unless you request another agency.   You will get a call also from outpatient therapy for therapy starting when the home therapy is done.  If you have questions or need to Reschedule the surgery, call the office ask for Lynsi Dooner.     You have decided to proceed with knee replacement surgery. You have decided not to continue with nonoperative measures such as but not limited to oral medication, weight loss, activity modification, physical therapy, bracing, or injection.  We will perform the procedure commonly known as total knee replacement. Some of the risks associated with knee replacement surgery include but are not limited to Bleeding Infection Swelling Stiffness Blood clot Pulmonary embolism  Loosening of the implant Pain that persists even after surgery  Infection is especially devastating complication of knee surgery although rare. If infection does occur your implant will usually have to be removed and several surgeries and antibiotics will be needed to eradicate the infection prior to performing a  repeat replacement.   In some cases amputation is required to eradicate the infection. In other rare cases a knee fusion is needed   In compliance with recent [place] law in federal regulation regarding opioid use and abuse and addiction, we will taper (stop) opioid medication after 2 weeks.  If you're not comfortable with these risks and would like to continue with nonoperative treatment please let Dr. Aline Brochure know prior to your surgery.

## 2022-02-18 NOTE — Addendum Note (Signed)
Addended byCandice Camp on: 02/18/2022 04:28 PM   Modules accepted: Orders

## 2022-02-19 ENCOUNTER — Ambulatory Visit: Payer: Federal, State, Local not specified - PPO | Attending: Nurse Practitioner | Admitting: Nurse Practitioner

## 2022-02-19 ENCOUNTER — Encounter: Payer: Self-pay | Admitting: Nurse Practitioner

## 2022-02-19 VITALS — BP 126/82 | HR 62 | Ht 71.0 in | Wt 254.6 lb

## 2022-02-19 DIAGNOSIS — I2510 Atherosclerotic heart disease of native coronary artery without angina pectoris: Secondary | ICD-10-CM

## 2022-02-19 DIAGNOSIS — Z87898 Personal history of other specified conditions: Secondary | ICD-10-CM

## 2022-02-19 DIAGNOSIS — Z01810 Encounter for preprocedural cardiovascular examination: Secondary | ICD-10-CM | POA: Diagnosis not present

## 2022-02-19 DIAGNOSIS — I480 Paroxysmal atrial fibrillation: Secondary | ICD-10-CM

## 2022-02-19 NOTE — Patient Instructions (Signed)

## 2022-02-19 NOTE — Progress Notes (Signed)
Cardiology Office Note:    Date:  02/19/2022   ID:  William Farley, DOB 07-10-1960, MRN [id number]  PCP:  Garey Ham, NP   [facility] Cardiologist:  Rozann Lesches, MD     Referring MD: Garey Ham, NP   CC: Here for 6 month follow-up appointment and for pre-operative cardiovascular risk assessment  History of Present Illness:    William Farley is a 61 y.o. male with a hx of the following:  CAD PAF Hx of seizure disorder Hx of thrombocytopenia Chronic PTSD Anxiety Hx of syncope and collapse  Patient is a 61 year old male with past medical history as mentioned above.  In the past he was evaluated for syncopal event in 02/2021 that was attributed to his seizure,  got up to go to the bathroom and passed out, wife noted seizure-like activity. Workup at [facility] in [place] was overall negative, troponins negative, EKG negative.  CT scan and MRI of brain, normal.  EEG was normal.  Echocardiogram revealed normal LV function. Monitor revealed less than 1% burden of PAF, had multiple brief episodes of SVT.  Dr. Domenic Polite who reviewed monitor was hesitant to start anticoagulation without further information.   Last seen by Dr. Audie Box in January 2023.  Pt stated he did not feel any A-fib and denied any tachycardia or palpitations.  Denied any chest pain or shortness of breath surrounding syncopal event, but did note intermittent SHOB, that was thought to be anxiety related. CHA2DS2-VASc score 0.  Etiology unclear for syncopal event, denied any recurrences of syncope.  Started on Toprol XL 25 mg daily to prevent any fast heart rates.  Underwent coronary CTA that revealed coronary calcium score 19, 50th percent for his demographic, nonobstructive CAD with calcified plaque in proximal LAD causing minimal (0 to 24%) stenosis.  Was told to follow-up in 6 months.  Today he presents for 6 month follow-up and also presents for preoperative cardiovascular risk  assessment.  Patient is scheduled to undergo left total knee arthroplasty by Dr. Arther Abbott with [facility] on March 10, 2022.  He presents today with his wife.  He denies any changes to his health since last office visit with Dr. Audie Box in January 2023.  Denies any chest pain, shortness of breath, palpitations, syncope, presyncope, dizziness, orthopnea, PND, acute bleeding, swelling, significant weight changes, or claudication.  Currently works for [employer], planning to retire next year and he is looking forward to this.  Denies any other questions or concerns today.  Past Medical History:  Diagnosis Date   Anxiety    Arthritis    CAD (coronary artery disease)    Minimal Non-obstructive CAD seen on coronary CTA 04/2021   Depression    GERD (gastroesophageal reflux disease)    History of kidney stones    History of thrombocytopenia    Hx of syncope    PAF (paroxysmal atrial fibrillation) (HCC)    ChadsVasc Score is 0.  Monitor revealed less than 1% PAF burden.   Seizure ([place]) 02/2021   Sleep apnea    wears CPAP     Past Surgical History:  Procedure Laterality Date   APPENDECTOMY     BIOPSY  05/01/2021   Procedure: BIOPSY;  Surgeon: Rogene Houston, MD;  Location: [facility];  Service: Endoscopy;;   CERVICAL SPINE SURGERY  2018   CHOLECYSTECTOMY     COLONOSCOPY WITH PROPOFOL N/A 05/01/2021   Procedure: COLONOSCOPY WITH PROPOFOL;  Surgeon: Rogene Houston, MD;  Location: [facility];  Service: Endoscopy;  Laterality: N/A;  1050   CYST EXCISION     CYSTOSCOPY     CYSTOSCOPY WITH RETROGRADE PYELOGRAM, URETEROSCOPY AND STENT PLACEMENT Right 08/08/2020   Procedure: CYSTOSCOPY WITH RIGHT RETROGRADE PYELOGRAM AND RIGHT URETEROSCOPY;  Surgeon: Cleon Gustin, MD;  Location: [facility];  Service: Urology;  Laterality: Right;   EXTRACORPOREAL SHOCK WAVE LITHOTRIPSY Right 05/21/2020   Procedure: EXTRACORPOREAL SHOCK WAVE LITHOTRIPSY (ESWL);  Surgeon: Cleon Gustin, MD;   Location: [facility];  Service: Urology;  Laterality: Right;   GASTRIC BYPASS OPEN  2003   INCISIONAL HERNIA REPAIR     kidney stone removal     KNEE ARTHROSCOPY Left    multiple   NECK SURGERY     POLYPECTOMY  05/01/2021   Procedure: POLYPECTOMY;  Surgeon: Rogene Houston, MD;  Location: [facility];  Service: Endoscopy;;   REPLACEMENT TOTAL KNEE Right    RHINOPLASTY     STONE EXTRACTION WITH BASKET Right 08/08/2020   Procedure: STONE EXTRACTION WITH BASKET;  Surgeon: Cleon Gustin, MD;  Location: [facility];  Service: Urology;  Laterality: Right;    Current Medications: Current Meds  Medication Sig   Cholecalciferol (VITAMIN D3) 250 MCG (10000 UT) TABS Take 7,500 mcg by mouth daily.   Cyanocobalamin (VITAMIN B12 PO) Take 1 tablet by mouth every other day.   DULoxetine (CYMBALTA) 60 MG capsule Take 60 mg by mouth 2 (two) times daily.   Ferrous Sulfate (IRON PO) Take 55 mg by mouth at bedtime.   HYDROcodone-acetaminophen (NORCO/VICODIN) 5-325 MG tablet One tablet every four hours as needed for acute pain.  Limit of five days per [place] statue.   ibuprofen (ADVIL) 800 MG tablet Take 1 tablet (800 mg total) by mouth every 8 (eight) hours as needed.   LORazepam (ATIVAN) 1 MG tablet Take 1 mg by mouth 3 (three) times daily as needed for anxiety.   metoprolol succinate (TOPROL XL) 25 MG 24 hr tablet Take 1 tablet (25 mg total) by mouth daily.   omeprazole (PRILOSEC) 20 MG capsule Take 20 mg by mouth in the morning.   zolpidem (AMBIEN) 10 MG tablet Take 10 mg by mouth at bedtime.   [DISCONTINUED] Cholecalciferol (VITAMIN D3) 125 MCG (5000 UT) TABS Take by mouth in the morning.   Allergies:   Codeine   Social History   Socioeconomic History   Marital status: Married    Spouse name: Not on file   Number of children: Not on file   Years of education: Not on file   Highest education level: Not on file  Occupational History   Occupation: Environmental manager  Tobacco Use   Smoking  status: Former    Packs/day: 0.50    Years: 10.00    Total pack years: 5.00    Types: Cigarettes    Quit date: 05/20/1996    Years since quitting: 25.7   Smokeless tobacco: Never  Vaping Use   Vaping Use: Never used  Substance and Sexual Activity   Alcohol use: Never   Drug use: Yes    Types: Marijuana    Comment: 1 or 2 x weeks   Sexual activity: Not on file  Other Topics Concern   Not on file  Social History Narrative   Right handed    Married wife   One story home   Social Determinants of Health   Financial Resource Strain: Not on file  Food Insecurity: Not on file  Transportation Needs: Not on file  Physical Activity: Not on file  Stress: Not on file  Social Connections: Not on file     Family History: The patient's family history includes Heart disease (age of onset: 47) in his father; Kidney failure in his father. There is no history of Colon cancer.  ROS:   Review of Systems  Constitutional: Negative.   HENT: Negative.    Eyes: Negative.   Respiratory: Negative.    Cardiovascular: Negative.   Gastrointestinal: Negative.   Genitourinary: Negative.   Musculoskeletal:  Positive for joint pain. Negative for back pain, falls, myalgias and neck pain.  Skin: Negative.   Neurological: Negative.   Endo/Heme/Allergies: Negative.   Psychiatric/Behavioral: Negative.      Please see the history of present illness.    All other systems reviewed and are negative.  EKGs/Labs/Other Studies Reviewed:    The following studies were reviewed today:   EKG:  EKG is ordered today.  The ekg ordered today demonstrates sinus bradycardia, 52 bpm, without acute ischemic changes.   MRI of brain on May 24, 2021: Normal brain MRI.  Coronary CTA on April 30, 2021: 1. Coronary calcium score of 19. This was 50th percentile for age and sex matched control. 2.  Normal coronary origin with right dominance. 3. Nonobstructive CAD, with calcified plaque in proximal LAD  causing minimal (0-24%) stenosis. CAD-RADS 1. Minimal non-obstructive CAD (0-24%). Consider non-atherosclerotic causes of chest pain. Consider preventive therapy and risk factor modification.  4.  No suspicious nodules, masses, or infiltrates are identified in the visualized portion of the lungs.  Mild scarring noted in both lung bases.  No pleural fluid seen 5.  Prior gastric bypass surgery is noted, with a small hiatal hernia.  14-day ZIO monitor on April 03, 2021: Predominant rhythm is sinus with heart rate ranging from 47 bpm up to 140 bpm and average heart rate 77 bpm. There were rare PACs including couplets and triplets representing less than 1% total beats. There were rare PVCs including couplets representing less than 1% total beats.   Multiple, brief episodes of SVT were noted.  An episode of sustained atrial fibrillation with RVR was also noted on December 24 with heart rate in the 150s on average. Atrial fibrillation represented less than 1% total rhythm burden however.   There were no pauses.   2D echocardiogram on March 13, 2021:  1. Left ventricular ejection fraction, by estimation, is 65 to 70%. The  left ventricle has normal function. The left ventricle has no regional  wall motion abnormalities. The left ventricular internal cavity size was  mildly dilated. Left ventricular  diastolic parameters were normal.   2. Right ventricular systolic function is normal. The right ventricular  size is normal. There is normal pulmonary artery systolic pressure.   3. The mitral valve is normal in structure. Trivial mitral valve  regurgitation.   4. The aortic valve is tricuspid. Aortic valve regurgitation is not  visualized.  Recent Labs: 04/21/2021: BUN 16; Creatinine, Ser 1.00; Potassium 4.8; Sodium 140  Recent Lipid Panel No results found for: "CHOL", "TRIG", "HDL", "CHOLHDL", "VLDL", "LDLCALC", "LDLDIRECT"   Risk Assessment/Calculations:    CHA2DS2-VASc Score = 0    This indicates a 0.2% annual risk of stroke. The patient's score is based upon: CHF History: 0 HTN History: 0 Diabetes History: 0 Stroke History: 0 Vascular Disease History: 0 Age Score: 0 Gender Score: 0     The 10-year ASCVD risk score (Arnett DK, et al.,  2019) is: 5.6%*   Values used to calculate the score:     Age: 30 years     Sex: Male     Is Non-Hispanic African American: No     Diabetic: No     Tobacco smoker: No     Systolic Blood Pressure: 650 mmHg     Is BP treated: No     HDL Cholesterol: 54 mg/dL*     Total Cholesterol: 152 mg/dL*     * - Cholesterol units were assumed for this score calculation  Physical Exam:    VS:  BP 126/82   Pulse 62   Ht '5\' 11"'$  (1.803 m)   Wt 254 lb 9.6 oz (115.5 kg)   SpO2 94%   BMI 35.51 kg/m     Wt Readings from Last 3 Encounters:  02/19/22 254 lb 9.6 oz (115.5 kg)  02/18/22 257 lb (116.6 kg)  02/03/22 259 lb (117.5 kg)     GEN: Obese, 61 y.o. Caucasian male in no acute distress HEENT: Normal NECK: No JVD; No carotid bruits CARDIAC: S1/S2, RRR, no murmurs, rubs, gallops; 2+ peripheral pulses throughout, strong and equal bilaterally RESPIRATORY:  Clear to auscultation without rales, wheezing or rhonchi  MUSCULOSKELETAL:  No edema; No deformity  SKIN: Warm and dry NEUROLOGIC:  Alert and oriented x 3 PSYCHIATRIC:  Normal, pleasant affect   ASSESSMENT:    1. Pre-operative cardiovascular examination   2. Coronary artery disease involving native heart without angina pectoris, unspecified vessel or lesion type   3. Paroxysmal atrial fibrillation (HCC)   4. History of syncope    PLAN:    In order of problems listed above:  Preoperative cardiovascular risk assessment Mr. Lick's perioperative risk of a major cardiac event is 0.4% according to the Revised Cardiac Risk Index (RCRI).  Therefore, he is at low risk for perioperative complications.   His functional capacity is excellent at 7.34 METs according to the [facility]  Activity Status Index (DASI). Recommendations: According to ACC/AHA guidelines, no further cardiovascular testing needed.  The patient may proceed to surgery at acceptable risk.   Antiplatelet and/or Anticoagulation Recommendations: Patient is not on any antiplatelet or anticoagulation therapy.  He does not require SBE prophylaxis prior to surgery. I recommended he follow-up with his surgeon regarding when/if to hold Ibuprofen prior to surgery.  He already had preop labs drawn with surgeon's office yesterday.  A copy of this note will be routed to requesting surgeon's office.  2. Minimal, non-obstructive CAD Coronary CTA in February 2023 revealed coronary calcium score 19, 50th percentile for his demographic.  Nonobstructive CAD with calcified plaque noted in proximal LAD causing minimal stenosis. Stable with no anginal symptoms. No indication for ischemic evaluation. PCP to manage lipids. Last lipid panel in 02/2021 was overall unremarkable. Continue Toprol XL. Currently not on ASA therapy; however will turn 62 next year and guidelines state ASA therapy not indicated in adults 60 years and older.  Heart healthy diet and regular cardiovascular exercise encouraged.    3. PAF Denies any tachycardia or palpitations.  CHA2DS2-VASc score 0.  Today twelve-lead EKG reveals sinus bradycardia, 52 bpm, completely asymptomatic with this.  Tolerating metoprolol well.  Continue Toprol-XL 25 mg daily.  Because CHA2DS2-VASc score is 0, he does not require AC therapy. Heart healthy diet and regular cardiovascular exercise as tolerated encouraged.   4. Hx of syncope Denies any recurrent syncope, presyncope or any dizziness. Etiology for past syncopeal event unclear. Continue to follow with PCP. Heart healthy  diet and regular cardiovascular exercise encouraged.   5. Disposition: Follow-up with Dr. Domenic Polite in 1 year or sooner if anything changes.     No slow heart rates. Works for [employer]. Needs replacement.   Medication Adjustments/Labs and Tests Ordered: Current medicines are reviewed at length with the patient today.  Concerns regarding medicines are outlined above.  Orders Placed This Encounter  Procedures   EKG 12-Lead   No orders of the defined types were placed in this encounter.   Patient Instructions  Medication Instructions:  Your physician recommends that you continue on your current medications as directed. Please refer to the Current Medication list given to you today.   Labwork: none  Testing/Procedures: none  Follow-Up:  Your physician recommends that you schedule a follow-up appointment in: 1 year. You will receive a call in about 10 months reminding you to schedule your appointment. If you do not receive this call, please contact our office.  Any Other Special Instructions Will Be Listed Below (If Applicable).  If you need a refill on your cardiac medications before your next appointment, please call your pharmacy.    SignedFinis Bud, NP  02/19/2022 11:25 AM    [place]

## 2022-02-25 ENCOUNTER — Other Ambulatory Visit: Payer: Self-pay | Admitting: Cardiovascular Disease

## 2022-02-25 ENCOUNTER — Other Ambulatory Visit: Payer: Self-pay | Admitting: Orthopedic Surgery

## 2022-02-25 DIAGNOSIS — M1712 Unilateral primary osteoarthritis, left knee: Secondary | ICD-10-CM

## 2022-03-04 ENCOUNTER — Telehealth: Payer: Self-pay | Admitting: Orthopedic Surgery

## 2022-03-04 NOTE — Telephone Encounter (Signed)
Patient lvm stating he has questions about his surgery.  Pt's # [phone]

## 2022-03-05 NOTE — Patient Instructions (Signed)
William Farley  03/05/2022     '@[email or web address]'$ @   Your procedure is scheduled on  03/10/2022.   Report to [facility] at  0600  A.M.   Call this number if you have problems the morning of surgery:  [phone]  If you experience any cold or flu symptoms such as cough, fever, chills, shortness of breath, etc. between now and your scheduled surgery, please notify us at the above number.   Remember:  Do not eat or drink after midnight.      Take these medicines the morning of surgery with A SIP OF WATER          duloxetine, lorazepam,metoprolol, omeprazole.     Do not wear jewelry, make-up or nail polish.  Do not wear lotions, powders, or perfumes, or deodorant.  Do not shave 48 hours prior to surgery.  Men may shave face and neck.  Do not bring valuables to the hospital.  [facility] is not responsible for any belongings or valuables.  Contacts, dentures or bridgework may not be worn into surgery.  Leave your suitcase in the car.  After surgery it may be brought to your room.  For patients admitted to the hospital, discharge time will be determined by your treatment team.  Patients discharged the day of surgery will not be allowed to drive home and must have someone with them for 24 hours.    Special instructions:   DO NOT smoke tobacco or vape for 24 hours before your procedure.  Please read over the following fact sheets that you were given. Pain Booklet, Coughing and Deep Breathing, Blood Transfusion Information, Lab Information, Total Joint Packet, MRSA Information, Surgical Site Infection Prevention, Anesthesia Post-op Instructions, and Care and Recovery After Surgery      Total Knee Replacement, Care After This sheet gives you information about how to care for yourself after your procedure. Your health care provider may also give you more specific instructions. If you have problems or questions, contact your health care provider. What can I  expect after the procedure? After the procedure, it is common to have: Redness, pain, and swelling at the incision area. Stiffness. Discomfort. A small amount of blood or clear fluid coming from your incision. Follow these instructions at home: Medicines Take over-the-counter and prescription medicines only as told by your health care provider. If you were prescribed a blood thinner (anticoagulant), take it as told by your health care provider. Ask your health care provider if the medicine prescribed to you: Requires you to avoid driving or using machinery. Can cause constipation. You may need to take these actions to prevent or treat constipation: Drink enough fluid to keep your urine pale yellow. Take over-the-counter or prescription medicines. Eat foods that are high in fiber, such as beans, whole grains, and fresh fruits and vegetables. Limit foods that are high in fat and processed sugars, such as fried or sweet foods. Incision care  Follow instructions from your health care provider about how to take care of your incision. Make sure you: Wash your hands with soap and water for at least 20 seconds before and after you change your bandage (dressing). If soap and water are not available, use hand sanitizer. Change your dressing as told by your health care provider. Leave stitches (sutures), staples, skin glue, or adhesive strips in place. These skin closures may need to stay in place for 2 weeks or longer. If adhesive strip edges start  to loosen and curl up, you may trim the loose edges. Do not remove adhesive strips completely unless your health care provider tells you to do that. Do not take baths, swim, or use a hot tub until your health care provider approves. Check your incision area every day for signs of infection. Check for: More redness, swelling, or pain. More fluid or blood. Warmth. Pus or a bad smell. Activity Rest as told by your health care provider. Avoid sitting for  a long time without moving. Get up to take short walks every 1-2 hours. This is important to improve blood flow and breathing. Ask for help if you feel weak or unsteady. Follow instructions from your health care provider about using a walker, crutches, or a cane. You may use your legs to support (bear) your body weight as told by your health care provider. Follow instructions about how much weight you may safely support on your affected leg (weight-bearing restrictions). A physical therapist may show you how to get out of a bed and chair and how to go up and down stairs. You will first do this with a walker, crutches, or a cane and then without any of these devices. Once you are able to walk without a limp, you may stop using a walker, crutches, or a cane. Do exercises as told by your health care provider or physical therapist. Avoid high-impact activities, including running, jumping rope, and doing jumping jacks. Do not play contact sports until your health care provider approves. Return to your normal activities as told by your health care provider. Ask your health care provider what activities are safe for you. Managing pain, stiffness, and swelling  If directed, put ice on your knee. To do this: Put ice in a plastic bag or use the icing device (cold flow pad) that you were given. Follow instructions from your health care provider about how to use the icing device. Place a towel between your skin and the bag or between your skin and the icing device. Leave the ice on for 20 minutes, 2-3 times a day. Remove the ice if your skin turns bright red. This is very important. If you cannot feel pain, heat, or cold, you have a greater risk of damage to the area. Move your toes often to reduce stiffness and swelling. Raise (elevate) your leg above the level of your heart while you are sitting or lying down. Use several pillows to keep your leg straight. Do not put a pillow just under the knee. If the  knee is bent for a long time, this may lead to stiffness. Wear elastic knee support as told by your health care provider. Safety  To help prevent falls, keep floors clear of objects you may trip over. Place items that you may need within easy reach. Wear an apron or tool belt with pockets for carrying objects. This leaves your hands free to help with your balance. Ask your health care provider when it is safe to drive. General instructions Wear compression stockings as told by your health care provider. These stockings help to prevent blood clots and reduce swelling in your legs. Continue with breathing exercises. This helps prevent lung infection. Do not use any products that contain nicotine or tobacco. These products include cigarettes, chewing tobacco, and vaping devices, such as e-cigarettes. These can delay healing after surgery. If you need help quitting, ask your health care provider. Tell your health care provider if you plan to have dental work. Also: Tell  your dentist about your joint replacement. Ask your health care provider if there are any special instructions you need to follow before having dental care and routine cleanings. Keep all follow-up visits. This is important. Contact a health care provider if: You have a fever or chills. You have a cough or feel short of breath. Your medicine is not controlling your pain. You have any of these signs of infection: More redness, swelling, or pain around your incision. More fluid or blood coming from your incision. Warmth coming from your incision. Pus or a bad smell coming from your incision. You fall. Get help right away if: You have severe pain. You have trouble breathing. You have chest pain. You have redness, swelling, pain, or warmth in your calf or leg. Your incision breaks open after sutures or staples are removed. These symptoms may represent a serious problem that is an emergency. Do not wait to see if the symptoms  will go away. Get medical help right away. Call your local emergency services (911 in the [place] Do not drive yourself to the hospital. Summary After the procedure, it is common to have pain and swelling at the incision area, a small amount of blood or fluid coming from your incision, and stiffness. Follow instructions from your health care provider about how to take care of your incision. Use crutches, a walker, or a cane as told by your health care provider. This information is not intended to replace advice given to you by your health care provider. Make sure you discuss any questions you have with your health care provider. Document Revised: 08/29/2019 Document Reviewed: 08/29/2019 Elsevier Patient Education  [place] Anesthesia, Adult, Care After The following information offers guidance on how to care for yourself after your procedure. Your health care provider may also give you more specific instructions. If you have problems or questions, contact your health care provider. What can I expect after the procedure? After the procedure, it is common for people to: Have pain or discomfort at the IV site. Have nausea or vomiting. Have a sore throat or hoarseness. Have trouble concentrating. Feel cold or chills. Feel weak, sleepy, or tired (fatigue). Have soreness and body aches. These can affect parts of the body that were not involved in surgery. Follow these instructions at home: For the time period you were told by your health care provider:  Rest. Do not participate in activities where you could fall or become injured. Do not drive or use machinery. Do not drink alcohol. Do not take sleeping pills or medicines that cause drowsiness. Do not make important decisions or sign legal documents. Do not take care of children on your own. General instructions Drink enough fluid to keep your urine pale yellow. If you have sleep apnea, surgery and certain medicines can  increase your risk for breathing problems. Follow instructions from your health care provider about wearing your sleep device: Anytime you are sleeping, including during daytime naps. While taking prescription pain medicines, sleeping medicines, or medicines that make you drowsy. Return to your normal activities as told by your health care provider. Ask your health care provider what activities are safe for you. Take over-the-counter and prescription medicines only as told by your health care provider. Do not use any products that contain nicotine or tobacco. These products include cigarettes, chewing tobacco, and vaping devices, such as e-cigarettes. These can delay incision healing after surgery. If you need help quitting, ask your health care provider. Contact a health  care provider if: You have nausea or vomiting that does not get better with medicine. You vomit every time you eat or drink. You have pain that does not get better with medicine. You cannot urinate or have bloody urine. You develop a skin rash. You have a fever. Get help right away if: You have trouble breathing. You have chest pain. You vomit blood. These symptoms may be an emergency. Get help right away. Call 911. Do not wait to see if the symptoms will go away. Do not drive yourself to the hospital. Summary After the procedure, it is common to have a sore throat, hoarseness, nausea, vomiting, or to feel weak, sleepy, or fatigue. For the time period you were told by your health care provider, do not drive or use machinery. Get help right away if you have difficulty breathing, have chest pain, or vomit blood. These symptoms may be an emergency. This information is not intended to replace advice given to you by your health care provider. Make sure you discuss any questions you have with your health care provider. Document Revised: 06/06/2021 Document Reviewed: 06/06/2021 Elsevier Patient Education  [place]. How  to Use Chlorhexidine Before Surgery Chlorhexidine gluconate (CHG) is a germ-killing (antiseptic) solution that is used to clean the skin. It can get rid of the bacteria that normally live on the skin and can keep them away for about 24 hours. To clean your skin with CHG, you may be given: A CHG solution to use in the shower or as part of a sponge bath. A prepackaged cloth that contains CHG. Cleaning your skin with CHG may help lower the risk for infection: While you are staying in the intensive care unit of the hospital. If you have a vascular access, such as a central line, to provide short-term or long-term access to your veins. If you have a catheter to drain urine from your bladder. If you are on a ventilator. A ventilator is a machine that helps you breathe by moving air in and out of your lungs. After surgery. What are the risks? Risks of using CHG include: A skin reaction. Hearing loss, if CHG gets in your ears and you have a perforated eardrum. Eye injury, if CHG gets in your eyes and is not rinsed out. The CHG product catching fire. Make sure that you avoid smoking and flames after applying CHG to your skin. Do not use CHG: If you have a chlorhexidine allergy or have previously reacted to chlorhexidine. On babies younger than 64 months of age. How to use CHG solution Use CHG only as told by your health care provider, and follow the instructions on the label. Use the full amount of CHG as directed. Usually, this is one bottle. During a shower Follow these steps when using CHG solution during a shower (unless your health care provider gives you different instructions): Start the shower. Use your normal soap and shampoo to wash your face and hair. Turn off the shower or move out of the shower stream. Pour the CHG onto a clean washcloth. Do not use any type of brush or rough-edged sponge. Starting at your neck, lather your body down to your toes. Make sure you follow these  instructions: If you will be having surgery, pay special attention to the part of your body where you will be having surgery. Scrub this area for at least 1 minute. Do not use CHG on your head or face. If the solution gets into your ears or  eyes, rinse them well with water. Avoid your genital area. Avoid any areas of skin that have broken skin, cuts, or scrapes. Scrub your back and under your arms. Make sure to wash skin folds. Let the lather sit on your skin for 1-2 minutes or as long as told by your health care provider. Thoroughly rinse your entire body in the shower. Make sure that all body creases and crevices are rinsed well. Dry off with a clean towel. Do not put any substances on your body afterward--such as powder, lotion, or perfume--unless you are told to do so by your health care provider. Only use lotions that are recommended by the manufacturer. Put on clean clothes or pajamas. If it is the night before your surgery, sleep in clean sheets.  During a sponge bath Follow these steps when using CHG solution during a sponge bath (unless your health care provider gives you different instructions): Use your normal soap and shampoo to wash your face and hair. Pour the CHG onto a clean washcloth. Starting at your neck, lather your body down to your toes. Make sure you follow these instructions: If you will be having surgery, pay special attention to the part of your body where you will be having surgery. Scrub this area for at least 1 minute. Do not use CHG on your head or face. If the solution gets into your ears or eyes, rinse them well with water. Avoid your genital area. Avoid any areas of skin that have broken skin, cuts, or scrapes. Scrub your back and under your arms. Make sure to wash skin folds. Let the lather sit on your skin for 1-2 minutes or as long as told by your health care provider. Using a different clean, wet washcloth, thoroughly rinse your entire body. Make sure that  all body creases and crevices are rinsed well. Dry off with a clean towel. Do not put any substances on your body afterward--such as powder, lotion, or perfume--unless you are told to do so by your health care provider. Only use lotions that are recommended by the manufacturer. Put on clean clothes or pajamas. If it is the night before your surgery, sleep in clean sheets. How to use CHG prepackaged cloths Only use CHG cloths as told by your health care provider, and follow the instructions on the label. Use the CHG cloth on clean, dry skin. Do not use the CHG cloth on your head or face unless your health care provider tells you to. When washing with the CHG cloth: Avoid your genital area. Avoid any areas of skin that have broken skin, cuts, or scrapes. Before surgery Follow these steps when using a CHG cloth to clean before surgery (unless your health care provider gives you different instructions): Using the CHG cloth, vigorously scrub the part of your body where you will be having surgery. Scrub using a back-and-forth motion for 3 minutes. The area on your body should be completely wet with CHG when you are done scrubbing. Do not rinse. Discard the cloth and let the area air-dry. Do not put any substances on the area afterward, such as powder, lotion, or perfume. Put on clean clothes or pajamas. If it is the night before your surgery, sleep in clean sheets.  For general bathing Follow these steps when using CHG cloths for general bathing (unless your health care provider gives you different instructions). Use a separate CHG cloth for each area of your body. Make sure you wash between any folds of skin and  between your fingers and toes. Wash your body in the following order, switching to a new cloth after each step: The front of your neck, shoulders, and chest. Both of your arms, under your arms, and your hands. Your stomach and groin area, avoiding the genitals. Your right leg and  foot. Your left leg and foot. The back of your neck, your back, and your buttocks. Do not rinse. Discard the cloth and let the area air-dry. Do not put any substances on your body afterward--such as powder, lotion, or perfume--unless you are told to do so by your health care provider. Only use lotions that are recommended by the manufacturer. Put on clean clothes or pajamas. Contact a health care provider if: Your skin gets irritated after scrubbing. You have questions about using your solution or cloth. You swallow any chlorhexidine. Call your local poison control center (1-940-639-0339 in the [place] Get help right away if: Your eyes itch badly, or they become very red or swollen. Your skin itches badly and is red or swollen. Your hearing changes. You have trouble seeing. You have swelling or tingling in your mouth or throat. You have trouble breathing. These symptoms may represent a serious problem that is an emergency. Do not wait to see if the symptoms will go away. Get medical help right away. Call your local emergency services (911 in the [place] Do not drive yourself to the hospital. Summary Chlorhexidine gluconate (CHG) is a germ-killing (antiseptic) solution that is used to clean the skin. Cleaning your skin with CHG may help to lower your risk for infection. You may be given CHG to use for bathing. It may be in a bottle or in a prepackaged cloth to use on your skin. Carefully follow your health care provider's instructions and the instructions on the product label. Do not use CHG if you have a chlorhexidine allergy. Contact your health care provider if your skin gets irritated after scrubbing. This information is not intended to replace advice given to you by your health care provider. Make sure you discuss any questions you have with your health care provider. Document Revised: 07/07/2021 Document Reviewed: 05/20/2020 Elsevier Patient Education  [place].

## 2022-03-05 NOTE — Telephone Encounter (Signed)
I called him he wants to know about fasting told him yes for the labs I also advised him they will give med instructions at his pre op visit, he does not take any that I need to advise him about  He is asking about home therapy hasn't heard anything yet I advised him usually very close to surgery time, sometimes after and he voiced understanding, told him orders previously sent to [facility] and he is on their list to call

## 2022-03-06 ENCOUNTER — Encounter (HOSPITAL_COMMUNITY): Payer: Self-pay

## 2022-03-06 ENCOUNTER — Encounter (HOSPITAL_COMMUNITY)
Admission: RE | Admit: 2022-03-06 | Discharge: 2022-03-06 | Disposition: A | Discharge status: Home / Self Care | Payer: Federal, State, Local not specified - PPO | Source: Ambulatory Visit | Attending: Orthopedic Surgery | Admitting: Orthopedic Surgery

## 2022-03-06 VITALS — BP 126/79 | HR 70 | Temp 98.4°F | Resp 18 | Ht 71.0 in | Wt 254.6 lb

## 2022-03-06 DIAGNOSIS — Z01812 Encounter for preprocedural laboratory examination: Secondary | ICD-10-CM | POA: Diagnosis not present

## 2022-03-06 DIAGNOSIS — M1712 Unilateral primary osteoarthritis, left knee: Secondary | ICD-10-CM | POA: Diagnosis not present

## 2022-03-06 DIAGNOSIS — Z01818 Encounter for other preprocedural examination: Secondary | ICD-10-CM

## 2022-03-06 LAB — CBC WITH DIFFERENTIAL/PLATELET
Abs Immature Granulocytes: 0.01 10*3/uL (ref 0.00–0.07)
Basophils Absolute: 0 10*3/uL (ref 0.0–0.1)
Basophils Relative: 1 %
Eosinophils Absolute: 0.2 10*3/uL (ref 0.0–0.5)
Eosinophils Relative: 3 %
HCT: 43.6 % (ref 39.0–52.0)
Hemoglobin: 15 g/dL (ref 13.0–17.0)
Immature Granulocytes: 0 %
Lymphocytes Relative: 18 %
Lymphs Abs: 1.1 10*3/uL (ref 0.7–4.0)
MCH: 30.7 pg (ref 26.0–34.0)
MCHC: 34.4 g/dL (ref 30.0–36.0)
MCV: 89.3 fL (ref 80.0–100.0)
Monocytes Absolute: 0.6 10*3/uL (ref 0.1–1.0)
Monocytes Relative: 10 %
Neutro Abs: 3.9 10*3/uL (ref 1.7–7.7)
Neutrophils Relative %: 68 %
Platelets: 161 10*3/uL (ref 150–400)
RBC: 4.88 MIL/uL (ref 4.22–5.81)
RDW: 13.1 % (ref 11.5–15.5)
WBC: 5.7 10*3/uL (ref 4.0–10.5)
nRBC: 0 % (ref 0.0–0.2)

## 2022-03-06 LAB — BASIC METABOLIC PANEL
Anion gap: 7 (ref 5–15)
BUN: 14 mg/dL (ref 6–20)
CO2: 24 mmol/L (ref 22–32)
Calcium: 8.8 mg/dL — ABNORMAL LOW (ref 8.9–10.3)
Chloride: 108 mmol/L (ref 98–111)
Creatinine, Ser: 0.93 mg/dL (ref 0.61–1.24)
GFR, Estimated: >60 mL/min (ref >60)
Glucose, Bld: 82 mg/dL (ref 70–99)
Potassium: 4.2 mmol/L (ref 3.5–5.1)
Sodium: 139 mmol/L (ref 135–145)

## 2022-03-06 LAB — SURGICAL PCR SCREEN
MRSA, PCR: NEGATIVE
Staphylococcus aureus: NEGATIVE

## 2022-03-07 LAB — TYPE AND SCREEN
ABO/RH(D): O NEG
Antibody Screen: NEGATIVE

## 2022-03-07 LAB — PREPARE RBC (CROSSMATCH)

## 2022-03-09 NOTE — H&P (Signed)
TOTAL KNEE ADMISSION H&P  Patient is being admitted for left total knee arthroplasty.  Subjective:  Chief Complaint:left knee pain.  HPI: William Farley, 61 y.o. male, presents at the request of Dr. Luna Glasgow for possible surgical intervention regarding pain left knee secondary to osteoarthritis  The patient has had multiple surgeries on his left knee he has had ibuprofen and various injections and has not improved.  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  He wears a CPAP  He had a preop cardiac clearance by Dr. 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   Chronic pain syndrome 02/18/2022   Chronic post-traumatic stress disorder 02/18/2022   Difficulty controlling anger 02/18/2022   Seizures ([place]) 03/07/2021   Thrombocytopenia ([place]) 03/07/2021   Status post total knee replacement 09/29/2013   Low serum testosterone level 03/11/2012   Past Medical History:  Diagnosis Date   Anxiety    Arthritis    CAD (coronary artery disease)    Minimal Non-obstructive CAD seen on coronary CTA 04/2021   Depression    GERD (gastroesophageal reflux disease)    History of kidney stones    History of thrombocytopenia    Hx of syncope    PAF (paroxysmal atrial fibrillation) (HCC)    ChadsVasc Score is 0.  Monitor revealed less than 1% PAF burden.   Seizure ([place]) 02/2021    Sleep apnea    wears CPAP     Past Surgical History:  Procedure Laterality Date   APPENDECTOMY     BIOPSY  05/01/2021   Procedure: BIOPSY;  Surgeon: Rogene Houston, MD;  [facility];  Service: Endoscopy;;   CERVICAL SPINE SURGERY  2018   CHOLECYSTECTOMY     COLONOSCOPY WITH PROPOFOL N/A 05/01/2021   Procedure: COLONOSCOPY WITH PROPOFOL;  Surgeon: Rogene Houston, MD;  Location: [facility];  Service: Endoscopy;  Laterality: N/A;  1050   CYST EXCISION     CYSTOSCOPY     CYSTOSCOPY WITH RETROGRADE PYELOGRAM, URETEROSCOPY AND STENT PLACEMENT Right 08/08/2020   Procedure: CYSTOSCOPY WITH RIGHT RETROGRADE PYELOGRAM AND RIGHT URETEROSCOPY;  Surgeon: Cleon Gustin, MD;  Location: [facility];  Service: Urology;  Laterality: Right;   EXTRACORPOREAL SHOCK WAVE LITHOTRIPSY Right 05/21/2020   Procedure: EXTRACORPOREAL SHOCK WAVE LITHOTRIPSY (ESWL);  Surgeon: Cleon Gustin, MD;  Location: [facility];  Service: Urology;  Laterality: Right;   GASTRIC BYPASS OPEN  2003   INCISIONAL HERNIA REPAIR     kidney stone removal     KNEE ARTHROSCOPY Left    multiple   NECK SURGERY     POLYPECTOMY  05/01/2021   Procedure: POLYPECTOMY;  Surgeon: Rogene Houston, MD;  [facility];  Service: Endoscopy;;   REPLACEMENT TOTAL KNEE Right    RHINOPLASTY     STONE EXTRACTION WITH  BASKET Right 08/08/2020   Procedure: STONE EXTRACTION WITH BASKET;  Surgeon: Cleon Gustin, MD;  Location: [facility];  Service: Urology;  Laterality: Right;    Current Facility-Administered Medications  Medication Dose Route Frequency Provider Last Rate Last Admin   bupivacaine-meloxicam ER (ZYNRELEF) injection 400 mg  400 mg Infiltration Once Carole Civil, MD       Current Outpatient Medications  Medication Sig Dispense Refill Last Dose   Cholecalciferol (VITAMIN D3 PO) Take 7,500 mcg by mouth daily.      Cyanocobalamin (VITAMIN B12 PO) Take 1 tablet by mouth every other day.      DULoxetine  (CYMBALTA) 60 MG capsule Take 60 mg by mouth 2 (two) times daily.      Ferrous Sulfate (IRON PO) Take 55 mg by mouth at bedtime.      LORazepam (ATIVAN) 1 MG tablet Take 1 mg by mouth 3 (three) times daily as needed for anxiety.      metoprolol succinate (TOPROL-XL) 25 MG 24 hr tablet TAKE 1 TABLET(25 MG) BY MOUTH DAILY 90 tablet 3    NON FORMULARY Pt uses a cpap nightly      omeprazole (PRILOSEC) 20 MG capsule Take 20 mg by mouth in the morning.      zolpidem (AMBIEN) 10 MG tablet Take 10 mg by mouth at bedtime.      HYDROcodone-acetaminophen (NORCO/VICODIN) 5-325 MG tablet One tablet every four hours as needed for acute pain.  Limit of five days per [place] statue. (Patient not taking: Reported on 03/04/2022) 30 tablet 0 Not Taking   ibuprofen (ADVIL) 800 MG tablet Take 1 tablet (800 mg total) by mouth every 8 (eight) hours as needed. (Patient not taking: Reported on 03/04/2022) 30 tablet 0 Not Taking   Allergies  Allergen Reactions   Codeine Itching, Rash and Other (See Comments)    Social History   Tobacco Use   Smoking status: Former    Packs/day: 0.50    Years: 10.00    Total pack years: 5.00    Types: Cigarettes    Quit date: 05/20/1996    Years since quitting: 25.8   Smokeless tobacco: Never  Substance Use Topics   Alcohol use: Never    Family History  Problem Relation Age of Onset   Heart disease Father 53   Kidney failure Father    Colon cancer Neg Hx      Review of Systems history of syncope but nothing recently no other findings on review of systems  Objective:  Physical Exam  In the office he was 511 257 pounds with a BMI of 35 his vital signs were stable.  He was well-developed well-nourished with no gross abnormalities  He had good pulses and normal perfusion without edema and normal color to his extremities  His neurologic exam was normal with no pathologic reflexes and normal sensation  Psychological findings included that he was awake alert and  oriented x 3 his mood and affect were normal  The skin showed no lacerations or ulcerations no nodularity no palpable masses no erythema and no evidence of infection  Musculoskeletal system  The right knee had a long surgical scar from his total knee it healed well with slight widening.  His left knee has a transverse scar just off the medial side of the tibial tubercle from a saw injury.  I do not think this will affect our surgical incision or affect healing  He still has maintained excellent range of motion 0  to 125 degrees he feels stable he is tender medially and laterally and around the patellofemoral joint.  Muscle tone and strength are normal  Imaging shows what looks like a [employer] total knee on the right.  The left knee is in 7 degrees of varus between the tibia and femur.  He does not have a lot of osteophytes he does have bone-on-bone contact medially    Vital signs in last 24 hours:    Labs:     Latest Ref Rng & Units 03/06/2022    8:19 AM 08/06/2020    7:56 AM  CBC  WBC 4.0 - 10.5 K/uL 5.7  4.8   Hemoglobin 13.0 - 17.0 g/dL 15.0  14.3   Hematocrit 39.0 - 52.0 % 43.6  42.0   Platelets 150 - 400 K/uL 161  128        Latest Ref Rng & Units 03/06/2022    8:19 AM 04/21/2021    3:19 PM 08/06/2020    7:56 AM  BMP  Glucose 70 - 99 mg/dL 82  96  98   BUN 6 - 20 mg/dL '14  16  17   '$ Creatinine 0.61 - 1.24 mg/dL 0.93  1.00  0.92   Sodium 135 - 145 mmol/L 139  140  141   Potassium 3.5 - 5.1 mmol/L 4.2  4.8  4.0   Chloride 98 - 111 mmol/L 108  107  107   CO2 22 - 32 mmol/L '24  28  25   '$ Calcium 8.9 - 10.3 mg/dL 8.8  8.4  8.6        Estimated body mass index is 35.51 kg/m as calculated from the following:   Height as of 03/06/22: '5\' 11"'$  (1.803 m).   Weight as of 03/06/22: 115.5 kg.   Assessment/Plan:  End stage arthritis, left knee   The procedure has been fully reviewed with the patient; The risks and benefits of surgery have been discussed and explained and  understood. Alternative treatment has also been reviewed, questions were encouraged and answered. The postoperative plan is also been reviewed.   The patient history, physical examination, clinical judgment of the provider and imaging studies are consistent with end stage degenerative joint disease of the left knee(s) and total knee arthroplasty is deemed medically necessary. The treatment options including medical management, injection therapy arthroscopy and arthroplasty were discussed at length. The risks and benefits of total knee arthroplasty were presented and reviewed. The risks due to aseptic loosening, infection, stiffness, patella tracking problems, thromboembolic complications and other imponderables were discussed. The patient acknowledged the explanation, agreed to proceed with the plan and consent was signed. Patient is being admitted for inpatient treatment for surgery, pain control, PT, OT, prophylactic antibiotics, VTE prophylaxis, progressive ambulation and ADL's and discharge planning. The patient is planning to be discharged home with home health services   Preoperative cardiovascular risk assessment Mr. Burningham's perioperative risk of a major cardiac event is 0.4% according to the Revised Cardiac Risk Index (RCRI).  Therefore, he is at low risk for perioperative complications.   His functional capacity is excellent at 7.34 METs according to the Duke Activity Status Index (DASI). Recommendations: According to ACC/AHA guidelines, no further cardiovascular testing needed.  The patient may proceed to surgery at acceptable risk.   Antiplatelet and/or Anticoagulation Recommendations: Patient is not on any antiplatelet or anticoagulation therapy.  He does not require SBE prophylaxis prior to surgery. I recommended he follow-up with his surgeon regarding when/if to hold Ibuprofen prior  to surgery.  He already had preop labs drawn with surgeon's office yesterday.  A copy of this note will be  routed to requesting surgeon's office.   2. Minimal, non-obstructive CAD Coronary CTA in February 2023 revealed coronary calcium score 19, 50th percentile for his demographic.  Nonobstructive CAD with calcified plaque noted in proximal LAD causing minimal stenosis. Stable with no anginal symptoms. No indication for ischemic evaluation. PCP to manage lipids. Last lipid panel in 02/2021 was overall unremarkable. Continue Toprol XL. Currently not on ASA therapy; however will turn 62 next year and guidelines state ASA therapy not indicated in adults 60 years and older.  Heart healthy diet and regular cardiovascular exercise encouraged.     3. PAF Denies any tachycardia or palpitations.  CHA2DS2-VASc score 0.  Today twelve-lead EKG reveals sinus bradycardia, 52 bpm, completely asymptomatic with this.  Tolerating metoprolol well.  Continue Toprol-XL 25 mg daily.  Because CHA2DS2-VASc score is 0, he does not require AC therapy. Heart healthy diet and regular cardiovascular exercise as tolerated encouraged.    4. Hx of syncope Denies any recurrent syncope, presyncope or any dizziness. Etiology for past syncopeal event unclear. Continue to follow with PCP. Heart healthy diet and regular cardiovascular exercise encouraged.    5. Disposition: Follow-up with Dr. Domenic Polite in 1 year or sooner if anything changes.

## 2022-03-10 ENCOUNTER — Ambulatory Visit (HOSPITAL_COMMUNITY): Payer: Federal, State, Local not specified - PPO

## 2022-03-10 ENCOUNTER — Other Ambulatory Visit: Payer: Self-pay

## 2022-03-10 ENCOUNTER — Encounter (HOSPITAL_COMMUNITY)
Admission: RE | Disposition: A | Discharge status: Home / Self Care | Payer: Self-pay | Source: Home / Self Care | Attending: Orthopedic Surgery

## 2022-03-10 ENCOUNTER — Observation Stay (HOSPITAL_COMMUNITY)
Admission: RE | Admit: 2022-03-10 | Discharge: 2022-03-11 | Disposition: A | Discharge status: Home / Self Care | Payer: Federal, State, Local not specified - PPO | Attending: Orthopedic Surgery | Admitting: Orthopedic Surgery

## 2022-03-10 ENCOUNTER — Ambulatory Visit (HOSPITAL_COMMUNITY): Payer: Federal, State, Local not specified - PPO | Admitting: Certified Registered"

## 2022-03-10 ENCOUNTER — Encounter (HOSPITAL_COMMUNITY): Payer: Self-pay | Admitting: Orthopedic Surgery

## 2022-03-10 DIAGNOSIS — Z87891 Personal history of nicotine dependence: Secondary | ICD-10-CM | POA: Diagnosis not present

## 2022-03-10 DIAGNOSIS — I2510 Atherosclerotic heart disease of native coronary artery without angina pectoris: Secondary | ICD-10-CM | POA: Insufficient documentation

## 2022-03-10 DIAGNOSIS — Z96652 Presence of left artificial knee joint: Secondary | ICD-10-CM | POA: Diagnosis not present

## 2022-03-10 DIAGNOSIS — I480 Paroxysmal atrial fibrillation: Secondary | ICD-10-CM | POA: Insufficient documentation

## 2022-03-10 DIAGNOSIS — Z79899 Other long term (current) drug therapy: Secondary | ICD-10-CM | POA: Insufficient documentation

## 2022-03-10 DIAGNOSIS — M1712 Unilateral primary osteoarthritis, left knee: Principal | ICD-10-CM | POA: Insufficient documentation

## 2022-03-10 DIAGNOSIS — Z96651 Presence of right artificial knee joint: Secondary | ICD-10-CM | POA: Insufficient documentation

## 2022-03-10 DIAGNOSIS — G8918 Other acute postprocedural pain: Secondary | ICD-10-CM | POA: Diagnosis not present

## 2022-03-10 DIAGNOSIS — Z471 Aftercare following joint replacement surgery: Secondary | ICD-10-CM | POA: Diagnosis not present

## 2022-03-10 HISTORY — PX: TOTAL KNEE ARTHROPLASTY: SHX125

## 2022-03-10 SURGERY — ARTHROPLASTY, KNEE, TOTAL
Anesthesia: Spinal | Site: Knee | Laterality: Left

## 2022-03-10 MED ORDER — DULOXETINE HCL 60 MG PO CPEP
60.0000 mg | ORAL_CAPSULE | Freq: Two times a day (BID) | ORAL | Status: DC
  Administered 2022-03-10 – 2022-03-11 (×2): 60 mg via ORAL
  Filled 2022-03-10 (×2): qty 1

## 2022-03-10 MED ORDER — PANTOPRAZOLE SODIUM 40 MG PO TBEC
40.0000 mg | DELAYED_RELEASE_TABLET | Freq: Every day | ORAL | Status: DC
  Administered 2022-03-11: 40 mg via ORAL
  Filled 2022-03-10: qty 1

## 2022-03-10 MED ORDER — FENTANYL CITRATE (PF) 100 MCG/2ML IJ SOLN
INTRAMUSCULAR | Status: AC
  Filled 2022-03-10: qty 2

## 2022-03-10 MED ORDER — DOCUSATE SODIUM 100 MG PO CAPS
100.0000 mg | ORAL_CAPSULE | Freq: Two times a day (BID) | ORAL | Status: DC
  Administered 2022-03-10 – 2022-03-11 (×2): 100 mg via ORAL
  Filled 2022-03-10 (×2): qty 1

## 2022-03-10 MED ORDER — FENTANYL CITRATE PF 50 MCG/ML IJ SOSY
25.0000 ug | PREFILLED_SYRINGE | INTRAMUSCULAR | Status: DC | PRN
  Administered 2022-03-10 (×3): 50 ug via INTRAVENOUS
  Filled 2022-03-10 (×3): qty 1

## 2022-03-10 MED ORDER — BUPIVACAINE-MELOXICAM ER 200-6 MG/7ML IJ SOLN
INTRAMUSCULAR | Status: DC | PRN
  Administered 2022-03-10: 400 mg

## 2022-03-10 MED ORDER — ONDANSETRON HCL 4 MG PO TABS: 4.0000 mg | ORAL_TABLET | Freq: Four times a day (QID) | ORAL | Status: DC | PRN

## 2022-03-10 MED ORDER — ORAL CARE MOUTH RINSE: 15.0000 mL | Freq: Once | OROMUCOSAL | Status: AC

## 2022-03-10 MED ORDER — POVIDONE-IODINE 10 % EX SWAB: 2.0000 | Freq: Once | CUTANEOUS | Status: DC

## 2022-03-10 MED ORDER — TRANEXAMIC ACID-NACL 1000-0.7 MG/100ML-% IV SOLN
1000.0000 mg | Freq: Once | INTRAVENOUS | Status: AC
  Administered 2022-03-10: 1000 mg via INTRAVENOUS
  Filled 2022-03-10: qty 100

## 2022-03-10 MED ORDER — OXYCODONE HCL 5 MG PO TABS
5.0000 mg | ORAL_TABLET | ORAL | Status: DC
  Administered 2022-03-10 – 2022-03-11 (×6): 5 mg via ORAL
  Filled 2022-03-10 (×7): qty 1

## 2022-03-10 MED ORDER — CEFAZOLIN SODIUM-DEXTROSE 2-4 GM/100ML-% IV SOLN
2.0000 g | INTRAVENOUS | Status: AC
  Administered 2022-03-10: 2 g via INTRAVENOUS

## 2022-03-10 MED ORDER — ASPIRIN 325 MG PO TBEC
325.0000 mg | DELAYED_RELEASE_TABLET | Freq: Every day | ORAL | Status: DC
  Administered 2022-03-11: 325 mg via ORAL
  Filled 2022-03-10: qty 1

## 2022-03-10 MED ORDER — TRANEXAMIC ACID-NACL 1000-0.7 MG/100ML-% IV SOLN
INTRAVENOUS | Status: AC
  Filled 2022-03-10: qty 100

## 2022-03-10 MED ORDER — HYDROMORPHONE HCL 1 MG/ML IJ SOLN
0.5000 mg | INTRAMUSCULAR | Status: DC | PRN
  Administered 2022-03-10: 1 mg via INTRAVENOUS
  Filled 2022-03-10: qty 1

## 2022-03-10 MED ORDER — PREGABALIN 50 MG PO CAPS
50.0000 mg | ORAL_CAPSULE | Freq: Three times a day (TID) | ORAL | Status: DC
  Administered 2022-03-10 – 2022-03-11 (×4): 50 mg via ORAL
  Filled 2022-03-10 (×4): qty 1

## 2022-03-10 MED ORDER — LORAZEPAM 1 MG PO TABS: 1.0000 mg | ORAL_TABLET | Freq: Three times a day (TID) | ORAL | Status: DC | PRN

## 2022-03-10 MED ORDER — CELECOXIB 100 MG PO CAPS
200.0000 mg | ORAL_CAPSULE | Freq: Every day | ORAL | Status: DC
  Administered 2022-03-10 – 2022-03-11 (×2): 200 mg via ORAL
  Filled 2022-03-10 (×2): qty 2

## 2022-03-10 MED ORDER — METOCLOPRAMIDE HCL 10 MG PO TABS: 5.0000 mg | ORAL_TABLET | Freq: Three times a day (TID) | ORAL | Status: DC | PRN

## 2022-03-10 MED ORDER — MIDAZOLAM HCL 5 MG/5ML IJ SOLN
INTRAMUSCULAR | Status: DC | PRN
  Administered 2022-03-10: 2 mg via INTRAVENOUS

## 2022-03-10 MED ORDER — HYDROMORPHONE HCL 1 MG/ML IJ SOLN
INTRAMUSCULAR | Status: AC
  Filled 2022-03-10: qty 0.5

## 2022-03-10 MED ORDER — OXYCODONE HCL 5 MG PO TABS
5.0000 mg | ORAL_TABLET | Freq: Once | ORAL | Status: AC | PRN
  Administered 2022-03-10: 5 mg via ORAL

## 2022-03-10 MED ORDER — OXYCODONE HCL 5 MG/5ML PO SOLN: 5.0000 mg | Freq: Once | ORAL | Status: AC | PRN

## 2022-03-10 MED ORDER — MENTHOL 3 MG MT LOZG: 1.0000 | LOZENGE | OROMUCOSAL | Status: DC | PRN

## 2022-03-10 MED ORDER — METHOCARBAMOL 1000 MG/10ML IJ SOLN: 500.0000 mg | Freq: Four times a day (QID) | INTRAVENOUS | Status: DC | PRN

## 2022-03-10 MED ORDER — ONDANSETRON HCL 4 MG/2ML IJ SOLN
4.0000 mg | Freq: Four times a day (QID) | INTRAMUSCULAR | Status: DC
  Filled 2022-03-10: qty 2

## 2022-03-10 MED ORDER — ROPIVACAINE HCL 5 MG/ML IJ SOLN
INTRAMUSCULAR | Status: AC
  Filled 2022-03-10: qty 30

## 2022-03-10 MED ORDER — SODIUM CHLORIDE 0.9 % IR SOLN
Status: DC | PRN
  Administered 2022-03-10: 3000 mL

## 2022-03-10 MED ORDER — VITAMIN B-12 100 MCG PO TABS
100.0000 ug | ORAL_TABLET | ORAL | Status: DC
  Administered 2022-03-11: 100 ug via ORAL
  Filled 2022-03-10: qty 1

## 2022-03-10 MED ORDER — PROPOFOL 500 MG/50ML IV EMUL
INTRAVENOUS | Status: DC | PRN
  Administered 2022-03-10: 70 ug/kg/min via INTRAVENOUS

## 2022-03-10 MED ORDER — PROPOFOL 10 MG/ML IV BOLUS
INTRAVENOUS | Status: AC
  Filled 2022-03-10: qty 20

## 2022-03-10 MED ORDER — ONDANSETRON HCL 4 MG/2ML IJ SOLN
4.0000 mg | Freq: Once | INTRAMUSCULAR | Status: AC | PRN
  Administered 2022-03-10: 4 mg via INTRAVENOUS
  Filled 2022-03-10: qty 2

## 2022-03-10 MED ORDER — CEFAZOLIN SODIUM-DEXTROSE 2-4 GM/100ML-% IV SOLN
INTRAVENOUS | Status: AC
  Filled 2022-03-10: qty 100

## 2022-03-10 MED ORDER — CHLORHEXIDINE GLUCONATE 0.12 % MT SOLN
15.0000 mL | Freq: Once | OROMUCOSAL | Status: AC
  Administered 2022-03-10: 15 mL via OROMUCOSAL

## 2022-03-10 MED ORDER — ACETAMINOPHEN 500 MG PO TABS
1000.0000 mg | ORAL_TABLET | Freq: Four times a day (QID) | ORAL | Status: AC
  Administered 2022-03-10 – 2022-03-11 (×4): 1000 mg via ORAL
  Filled 2022-03-10 (×3): qty 2

## 2022-03-10 MED ORDER — ACETAMINOPHEN 500 MG PO TABS
ORAL_TABLET | ORAL | Status: AC
  Filled 2022-03-10: qty 2

## 2022-03-10 MED ORDER — 0.9 % SODIUM CHLORIDE (POUR BTL) OPTIME
TOPICAL | Status: DC | PRN
  Administered 2022-03-10: 1000 mL

## 2022-03-10 MED ORDER — BUPIVACAINE-MELOXICAM ER 200-6 MG/7ML IJ SOLN
INTRAMUSCULAR | Status: AC
  Filled 2022-03-10: qty 2

## 2022-03-10 MED ORDER — BISACODYL 5 MG PO TBEC: 5.0000 mg | DELAYED_RELEASE_TABLET | Freq: Every day | ORAL | Status: DC | PRN

## 2022-03-10 MED ORDER — POLYETHYLENE GLYCOL 3350 17 G PO PACK
17.0000 g | PACK | Freq: Every day | ORAL | Status: DC
  Filled 2022-03-10: qty 1

## 2022-03-10 MED ORDER — ALUM & MAG HYDROXIDE-SIMETH 200-200-20 MG/5ML PO SUSP: 30.0000 mL | ORAL | Status: DC | PRN

## 2022-03-10 MED ORDER — METOCLOPRAMIDE HCL 5 MG/ML IJ SOLN: 5.0000 mg | Freq: Three times a day (TID) | INTRAMUSCULAR | Status: DC | PRN

## 2022-03-10 MED ORDER — METHOCARBAMOL 500 MG PO TABS
500.0000 mg | ORAL_TABLET | Freq: Four times a day (QID) | ORAL | Status: DC | PRN
  Administered 2022-03-10 – 2022-03-11 (×2): 500 mg via ORAL
  Filled 2022-03-10 (×2): qty 1

## 2022-03-10 MED ORDER — CHLORHEXIDINE GLUCONATE 0.12 % MT SOLN
OROMUCOSAL | Status: AC
  Filled 2022-03-10: qty 15

## 2022-03-10 MED ORDER — SODIUM CHLORIDE 0.9 % IV SOLN: INTRAVENOUS | Status: AC

## 2022-03-10 MED ORDER — CEFAZOLIN SODIUM-DEXTROSE 2-4 GM/100ML-% IV SOLN
2.0000 g | Freq: Four times a day (QID) | INTRAVENOUS | Status: AC
  Administered 2022-03-10: 2 g via INTRAVENOUS
  Filled 2022-03-10 (×2): qty 100

## 2022-03-10 MED ORDER — PROPOFOL 10 MG/ML IV BOLUS
INTRAVENOUS | Status: DC | PRN
  Administered 2022-03-10: 40 mg via INTRAVENOUS
  Administered 2022-03-10: 30 mg via INTRAVENOUS
  Administered 2022-03-10: 130 mg via INTRAVENOUS

## 2022-03-10 MED ORDER — TRANEXAMIC ACID-NACL 1000-0.7 MG/100ML-% IV SOLN
1000.0000 mg | INTRAVENOUS | Status: AC
  Administered 2022-03-10: 1000 mg via INTRAVENOUS

## 2022-03-10 MED ORDER — HYDROMORPHONE HCL 1 MG/ML IJ SOLN
0.2500 mg | INTRAMUSCULAR | Status: DC | PRN
  Administered 2022-03-10 (×2): 0.5 mg via INTRAVENOUS
  Filled 2022-03-10: qty 0.5

## 2022-03-10 MED ORDER — BUPIVACAINE HCL (PF) 0.25 % IJ SOLN
INTRAMUSCULAR | Status: DC | PRN
  Administered 2022-03-10: 30 mL via EPIDURAL

## 2022-03-10 MED ORDER — PHENOL 1.4 % MT LIQD: 1.0000 | OROMUCOSAL | Status: DC | PRN

## 2022-03-10 MED ORDER — MIDAZOLAM HCL 2 MG/2ML IJ SOLN
INTRAMUSCULAR | Status: AC
  Filled 2022-03-10: qty 2

## 2022-03-10 MED ORDER — FENTANYL CITRATE (PF) 100 MCG/2ML IJ SOLN
INTRAMUSCULAR | Status: DC | PRN
  Administered 2022-03-10 (×4): 50 ug via INTRAVENOUS

## 2022-03-10 MED ORDER — LACTATED RINGERS IV SOLN: INTRAVENOUS | Status: DC

## 2022-03-10 MED ORDER — CEFAZOLIN SODIUM-DEXTROSE 2-4 GM/100ML-% IV SOLN
INTRAVENOUS | Status: AC
  Administered 2022-03-10: 2 g via INTRAVENOUS
  Filled 2022-03-10: qty 100

## 2022-03-10 MED ORDER — TRANEXAMIC ACID-NACL 1000-0.7 MG/100ML-% IV SOLN
INTRAVENOUS | Status: AC
  Administered 2022-03-10: 1000 mg
  Filled 2022-03-10: qty 100

## 2022-03-10 MED ORDER — ONDANSETRON HCL 4 MG/2ML IJ SOLN: 4.0000 mg | Freq: Four times a day (QID) | INTRAMUSCULAR | Status: DC | PRN

## 2022-03-10 MED ORDER — OXYCODONE HCL 5 MG PO TABS
5.0000 mg | ORAL_TABLET | ORAL | Status: DC | PRN
  Administered 2022-03-11: 5 mg via ORAL
  Filled 2022-03-10: qty 2

## 2022-03-10 MED ORDER — METOPROLOL SUCCINATE ER 25 MG PO TB24
25.0000 mg | ORAL_TABLET | Freq: Every day | ORAL | Status: DC
  Administered 2022-03-11: 25 mg via ORAL
  Filled 2022-03-10: qty 1

## 2022-03-10 SURGICAL SUPPLY — 59 items
ATTUNE MED DOME PAT 38 KNEE (Knees) IMPLANT
ATTUNE PS FEM LT SZ 7 CEM KNEE (Femur) IMPLANT
BANDAGE ESMARK 6X9 LF (GAUZE/BANDAGES/DRESSINGS) ×1 IMPLANT
BASE TIBIAL CEM ATTUNE SZ 7 (Knees) ×1 IMPLANT
BASEPLATE TIB CEM ATTUNE SZ7 (Knees) IMPLANT
BLADE SAGITTAL 25.0X1.27X90 (BLADE) ×1 IMPLANT
BLADE SAW SGTL 11.0X1.19X90.0M (BLADE) ×1 IMPLANT
BLADE SURG SZ10 CARB STEEL (BLADE) ×1 IMPLANT
BNDG ESMARK 6X9 LF (GAUZE/BANDAGES/DRESSINGS) ×1
CEMENT HV SMART SET (Cement) ×2 IMPLANT
CLOTH BEACON ORANGE TIMEOUT ST (SAFETY) ×1 IMPLANT
COOLER ICEMAN CLASSIC (MISCELLANEOUS) ×1 IMPLANT
COVER LIGHT HANDLE STERIS (MISCELLANEOUS) ×2 IMPLANT
CUFF TOURN SGL QUICK 34 (TOURNIQUET CUFF) ×1
CUFF TRNQT CYL 34X4.125X (TOURNIQUET CUFF) ×1 IMPLANT
DRAPE BACK TABLE (DRAPES) ×1 IMPLANT
DRAPE EXTREMITY T 121X128X90 (DISPOSABLE) ×1 IMPLANT
DRESSING AQUACEL AG ADV 3.5X12 (MISCELLANEOUS) ×1 IMPLANT
DRSG AQUACEL AG ADV 3.5X12 (MISCELLANEOUS) ×1
DURAPREP 26ML APPLICATOR (WOUND CARE) ×2 IMPLANT
ELECT REM PT RETURN 9FT ADLT (ELECTROSURGICAL) ×1
ELECTRODE REM PT RTRN 9FT ADLT (ELECTROSURGICAL) ×1 IMPLANT
GLOVE BIO SURGEON STRL SZ7 (GLOVE) IMPLANT
GLOVE BIOGEL PI IND STRL 7.0 (GLOVE) ×3 IMPLANT
GLOVE BIOGEL PI IND STRL 8.5 (GLOVE) ×1 IMPLANT
GLOVE ECLIPSE 6.5 STRL STRAW (GLOVE) IMPLANT
GLOVE SKINSENSE STRL SZ8.0 LF (GLOVE) ×1 IMPLANT
GOWN STRL REUS W/TWL LRG LVL3 (GOWN DISPOSABLE) ×3 IMPLANT
GOWN STRL REUS W/TWL XL LVL3 (GOWN DISPOSABLE) ×1 IMPLANT
HANDPIECE INTERPULSE COAX TIP (DISPOSABLE) ×1
HOOD W/PEELAWAY (MISCELLANEOUS) ×4 IMPLANT
INSERT TIB ATTUNE FB SZ7X6 (Insert) IMPLANT
INST SET MAJOR BONE (KITS) ×1 IMPLANT
IV NS IRRIG 3000ML ARTHROMATIC (IV SOLUTION) ×1 IMPLANT
KIT BLADEGUARD II DBL (SET/KITS/TRAYS/PACK) ×1 IMPLANT
KIT TURNOVER KIT A (KITS) ×1 IMPLANT
MANIFOLD NEPTUNE II (INSTRUMENTS) ×1 IMPLANT
MARKER SKIN DUAL TIP RULER LAB (MISCELLANEOUS) ×1 IMPLANT
NS IRRIG 1000ML POUR BTL (IV SOLUTION) ×1 IMPLANT
PACK TOTAL JOINT (CUSTOM PROCEDURE TRAY) ×1 IMPLANT
PAD ARMBOARD 7.5X6 YLW CONV (MISCELLANEOUS) ×1 IMPLANT
PAD COLD SHLDR SM WRAP-ON (PAD) ×1 IMPLANT
PILLOW KNEE EXTENSION 0 DEG (MISCELLANEOUS) ×1 IMPLANT
PIN/DRILL PACK ORTHO 1/8X3.0 (PIN) ×1 IMPLANT
SAW OSC TIP CART 19.5X105X1.3 (SAW) ×1 IMPLANT
SET BASIN LINEN APH (SET/KITS/TRAYS/PACK) ×1 IMPLANT
SET HNDPC FAN SPRY TIP SCT (DISPOSABLE) ×1 IMPLANT
SOLUTION PRONTOSAN WOUND 350ML (IRRIGATION / IRRIGATOR) ×1 IMPLANT
STAPLER VISISTAT 35W (STAPLE) ×1 IMPLANT
SUT BRALON NAB BRD #1 30IN (SUTURE) ×1 IMPLANT
SUT MNCRL 0 VIOLET CTX 36 (SUTURE) ×1 IMPLANT
SUT MON AB 0 CT1 (SUTURE) ×1 IMPLANT
SUT MONOCRYL 0 CTX 36 (SUTURE) ×1
SYR BULB IRRIG 60ML STRL (SYRINGE) ×1 IMPLANT
TOWEL OR 17X26 4PK STRL BLUE (TOWEL DISPOSABLE) ×1 IMPLANT
TOWER CARTRIDGE SMART MIX (DISPOSABLE) ×1 IMPLANT
TRAY FOLEY MTR SLVR 16FR STAT (SET/KITS/TRAYS/PACK) ×1 IMPLANT
WATER STERILE IRR 1000ML POUR (IV SOLUTION) ×2 IMPLANT
YANKAUER SUCT 12FT TUBE ARGYLE (SUCTIONS) ×1 IMPLANT

## 2022-03-10 NOTE — Anesthesia Procedure Notes (Signed)
Anesthesia Regional Block: Adductor canal block   Pre-Anesthetic Checklist: , timeout performed,  Correct Patient, Correct Site, Correct Laterality,  Correct Procedure, Correct Position, site marked,  Risks and benefits discussed,  Surgical consent,  Pre-op evaluation,  At surgeon's request and post-op pain management  Laterality: Left  Prep: Maximum Sterile Barrier Precautions used, chloraprep       Needles:  Injection technique: Single-shot  Needle Type: Stimiplex     Needle Length: 15cm  Needle Gauge: 20   Needle insertion depth: 10 cm   Additional Needles:   Procedures:,,,, ultrasound used (permanent image in chart),,    Narrative:  Start time: 03/10/2022 10:30 AM End time: 03/10/2022 10:35 AM Injection made incrementally with aspirations every 5 mL.  Performed by: Personally  Anesthesiologist: [name], MD CRNA: Myna Bright, CRNA

## 2022-03-10 NOTE — Evaluation (Signed)
Physical Therapy Evaluation Patient Details Name: William Farley MRN: [id number] DOB: 12-Nov-1960 Today's Date: 03/10/2022   LEFT KNEE ROM:  0 - 100 degrees AMBULATION DISTANCE: 100 feet using RW with Supervision   History of Present Illness  William Farley is a 61 y/o male s/p Left TKA on 03/10/22 with the diagnosis of left knee osteoarthritis.  Clinical Impression  Patient demonstrates good return for moving LLE during bed mobility, transfers, and ambulation in room/hallway without loss of balance and fair/good return for left heel to toe stepping without loss of balance.  Patient tolerated sitting up in chair with LLE dangling after therapy - his spouse present in room.  Patient will benefit from continued skilled physical therapy in hospital and recommended venue below to increase strength, balance, endurance for safe ADLs and gait.        Recommendations for follow up therapy are one component of a multi-disciplinary discharge planning process, led by the attending physician.  Recommendations may be updated based on patient status, additional functional criteria and insurance authorization.  Follow Up Recommendations Home health PT      Assistance Recommended at Discharge Set up Supervision/Assistance  Patient can return home with the following  A little help with walking and/or transfers;A little help with bathing/dressing/bathroom;Help with stairs or ramp for entrance;Assistance with cooking/housework    Equipment Recommendations Rolling walker (2 wheels);BSC/3in1  Recommendations for Other Services       Functional Status Assessment Patient has had a recent decline in their functional status and demonstrates the ability to make significant improvements in function in a reasonable and predictable amount of time.     Precautions / Restrictions Precautions Precautions: Fall Restrictions Weight Bearing Restrictions: Yes LLE Weight Bearing: Weight bearing as tolerated       Mobility  Bed Mobility Overal bed mobility: Modified Independent                  Transfers Overall transfer level: Needs assistance Equipment used: Rolling walker (2 wheels) Transfers: Sit to/from Stand, Bed to chair/wheelchair/BSC Sit to Stand: Supervision   Step pivot transfers: Supervision       General transfer comment: increased time, labored movement    Ambulation/Gait Ambulation/Gait assistance: Supervision Gait Distance (Feet): 100 Feet Assistive device: Rolling walker (2 wheels) Gait Pattern/deviations: Decreased step length - right, Decreased step length - left, Decreased stride length, Antalgic, Decreased stance time - left Gait velocity: decreased     General Gait Details: slightly labored cadence with fair/good return for left heel to toe stepping without loss of balance, limited mostly due to fatigue and minor increase in left knee pain  Stairs            Wheelchair Mobility    Modified Rankin (Stroke Patients Only)       Balance Overall balance assessment: Needs assistance Sitting-balance support: Feet supported, No upper extremity supported Sitting balance-Leahy Scale: Good Sitting balance - Comments: seated at EOB   Standing balance support: During functional activity, No upper extremity supported Standing balance-Leahy Scale: Poor Standing balance comment: fair/poor without AD, fair/good using RW                             Pertinent Vitals/Pain Pain Assessment Pain Assessment: 0-10 Pain Score: 8  Pain Location: left knee Pain Descriptors / Indicators: Sore Pain Intervention(s): Limited activity within patient's tolerance, Monitored during session, Repositioned, RN gave pain meds during session    Home  Living Family/patient expects to be discharged to:: Private residence Living Arrangements: Spouse/significant other Available Help at Discharge: Family;Available 24 hours/day Type of Home: House Home Access:  Stairs to enter Entrance Stairs-Rails: None Entrance Stairs-Number of Steps: 1            Prior Function Prior Level of Function : Independent/Modified Independent;Driving             Mobility Comments: Hydrographic surveyor, drives ADLs Comments: Independent     Hand Dominance        Extremity/Trunk Assessment   Upper Extremity Assessment Upper Extremity Assessment: Overall WFL for tasks assessed    Lower Extremity Assessment Lower Extremity Assessment: Overall WFL for tasks assessed;LLE deficits/detail LLE Deficits / Details: grossly 4/5 LLE: Unable to fully assess due to pain LLE Sensation: WNL LLE Coordination: WNL    Cervical / Trunk Assessment Cervical / Trunk Assessment: Normal  Communication   Communication: No difficulties  Cognition Arousal/Alertness: Awake/alert Behavior During Therapy: WFL for tasks assessed/performed Overall Cognitive Status: Within Functional Limits for tasks assessed                                          General Comments      Exercises Total Joint Exercises Ankle Circles/Pumps: Supine, 10 reps, Left, Strengthening, AROM Quad Sets: AROM, Strengthening, Left, 10 reps, Supine Short Arc Quad: Supine, 10 reps, Left, Strengthening, AROM Heel Slides: AROM, Strengthening, Left, 10 reps, Supine Goniometric ROM: Left Knee ROM: 0 - 100   Assessment/Plan    PT Assessment Patient needs continued PT services  PT Problem List Decreased strength;Decreased activity tolerance;Decreased balance;Decreased mobility;Decreased range of motion       PT Treatment Interventions DME instruction;Gait training;Stair training;Functional mobility training;Therapeutic activities;Therapeutic exercise;Patient/family education;Balance training    PT Goals (Current goals can be found in the Care Plan section)  Acute Rehab PT Goals Patient Stated Goal: return home with family to assist PT Goal Formulation: With patient Time For Goal  Achievement: 03/12/22 Potential to Achieve Goals: Good    Frequency BID     Co-evaluation               AM-PAC PT "6 Clicks" Mobility  Outcome Measure Help needed turning from your back to your side while in a flat bed without using bedrails?: None Help needed moving from lying on your back to sitting on the side of a flat bed without using bedrails?: None Help needed moving to and from a bed to a chair (including a wheelchair)?: A Little Help needed standing up from a chair using your arms (e.g., wheelchair or bedside chair)?: A Little Help needed to walk in hospital room?: A Little Help needed climbing 3-5 steps with a railing? : A Little 6 Click Score: 20    End of Session   Activity Tolerance: Patient tolerated treatment well;Patient limited by fatigue;Patient limited by pain Patient left: in chair;with call bell/phone within reach;with family/visitor present Nurse Communication: Mobility status PT Visit Diagnosis: Unsteadiness on feet (R26.81);Other abnormalities of gait and mobility (R26.89);Muscle weakness (generalized) (M62.81)    Time: 3235-5732 PT Time Calculation (min) (ACUTE ONLY): 26 min   Charges:   PT Evaluation $PT Eval Moderate Complexity: 1 Mod PT Treatments $Therapeutic Activity: 23-37 mins        3:52 PM, 03/10/22 Lonell Grandchild, MPT Physical Therapist with [facility] [phone] office [phone] mobile phone

## 2022-03-10 NOTE — Anesthesia Preprocedure Evaluation (Signed)
Anesthesia Evaluation  Patient identified by MRN, date of birth, ID band Patient awake    Reviewed: Allergy & Precautions, H&P , NPO status , Patient's Chart, lab work & pertinent test results, reviewed documented beta blocker date and time   Airway Mallampati: II  TM Distance: >3 FB Neck ROM: full    Dental no notable dental hx.    Pulmonary sleep apnea , former smoker   Pulmonary exam normal breath sounds clear to auscultation       Cardiovascular Exercise Tolerance: Good  Rhythm:regular Rate:Normal     Neuro/Psych Seizures -, Well Controlled,  PSYCHIATRIC DISORDERS Anxiety Depression       GI/Hepatic Neg liver ROS,GERD  Medicated,,  Endo/Other  negative endocrine ROS    Renal/GU negative Renal ROS  negative genitourinary   Musculoskeletal   Abdominal   Peds  Hematology negative hematology ROS (+)   Anesthesia Other Findings   Reproductive/Obstetrics negative OB ROS                             Anesthesia Physical Anesthesia Plan  ASA: 2  Anesthesia Plan: Spinal   Post-op Pain Management:    Induction:   PONV Risk Score and Plan: Propofol infusion  Airway Management Planned:   Additional Equipment:   Intra-op Plan:   Post-operative Plan:   Informed Consent: I have reviewed the patients History and Physical, chart, labs and discussed the procedure including the risks, benefits and alternatives for the proposed anesthesia with the patient or authorized representative who has indicated his/her understanding and acceptance.     Dental Advisory Given  Plan Discussed with: CRNA  Anesthesia Plan Comments:        Anesthesia Quick Evaluation

## 2022-03-10 NOTE — Transfer of Care (Signed)
Immediate Anesthesia Transfer of Care Note  Patient: William Farley  Procedure(s) Performed: TOTAL KNEE ARTHROPLASTY (Left: Knee)  Patient Location: PACU  Anesthesia Type:General  Level of Consciousness: awake, alert , and oriented  Airway & Oxygen Therapy: Patient Spontanous Breathing and Patient connected to nasal cannula oxygen  Post-op Assessment: Report given to RN and Post -op Vital signs reviewed and stable  Post vital signs: Reviewed and stable  Last Vitals:  Vitals Value Taken Time  BP 142/84 03/10/22 1018  Temp    Pulse 62 03/10/22 1023  Resp 14 03/10/22 1023  SpO2 66 % 03/10/22 1023  Vitals shown include unvalidated device data.  Last Pain: There were no vitals filed for this visit.       Complications: No notable events documented.

## 2022-03-10 NOTE — Op Note (Signed)
03/10/2022  10:05 AM  PATIENT:  William Farley  61 y.o. male  PRE-OPERATIVE DIAGNOSIS:  left knee osteoarthritis  POST-OPERATIVE DIAGNOSIS:  left knee osteoarthritis  PROCEDURE:  Procedure(s): TOTAL KNEE ARTHROPLASTY (Left)  Implant DePuy fixed-bearing posterior stabilized attune total knee Femoral cut 9 mm distal femur Tibial cut 3 mm reference medial side proximal tibia Patella cut from 25 down to 15    SURGEON:  Surgeon(s) and Role:    Carole Civil, MD - Primary  Procedure was done  The patient was first seen in the preop area and reevaluated.  He was cleared for surgery and his left knee was confirmed as the surgical site and marked.  Chart review was performed and images were reevaluated.  Implants were in place  The patient was taken to the operating room for spinal anesthesia and then placed supine.  A catheter was placed in the bladder and the left leg was examined and then prepped and draped sterilely.  There was no preop flexion contracture he actually had a few degrees of hyperextension in his preop flexion angles approximate 115 degrees.  After the timeout was done the limb was exsanguinated with a 6 inch Esmarch the tourniquet was elevated to 275 mmHg  A midline incision was made with the knee in flexion the subcutaneous tissue was divided.  The extensor mechanism was marked and then a medial arthrotomy was performed.  The fat pad was excised the patella was everted the ACL and PCL were resected along with the anterior portions of the medial lateral meniscus.  3/8 inch drill bit was used to open the femoral canal and then a 5 degree left 9 mm resection intramedullary rod was placed. After distal femoral resection was completed distal femoral medial condylar resection measured 9 mm  The tibia this was so fixated forward and the remaining portions of the menisci were removed.  External alignment guide was sent for 3 degree posterior slope 3 mm resection from  the deficient medial side with a neutral varus valgus cut. Landmarks tibial spine medial lateral malleoli tibial crest  Proximal tibia was removed and measured 3 mm resection from the medial side with 9 mm resection from the lateral side  Extension gap was checked.  The knee balanced in extension between the 6 and 7 spacer block  The knee was then flexed and the femur was measured and measured in between a 6 and 7 we took the 7 and pinned it in place with appropriate external rotation 90 degrees from the Whitesides line  The spacer block 6 and 7 was placed the 7 seemed a little bit tight.  There is no need to move the femoral block in the anterior and posterior cuts were made followed by chamfer cuts  Spacer block was then placed again and again the size seems to be between a 6 and 7  After making the 7 notch cut and preparing the proximal tibia a trial reduction was performed with a 7 femur 7 tibia and a 7 polyethylene insert as the knee went into flexion and the polyethylene insert lifted up and therefore 6 spacer block was placed.  With the 6 in place the knee flexed to 120 degrees came to full extension and at 90 degrees had a stable anterior posterior drawer with only 3 to 5 mm of anterior motion.  We did a rotatory test at 90 degrees of flexion there was no lift off  Trial blocks components were removed  Patella  was prepared from a 25 down to a 15 and a 38 x 10 patella was prepared  After drilling holes in the patella knee was irrigated.  Drill holes were placed in the proximal tibial and sclerotic bone.  Cement was prepared implants were placed excess cement was removed the cement was allowed to cure  After implantation of all the implants and the 6 polyethylene repeat examination of range of motion and stability testing was performed and the knee was stable as described before.  Knee was irrigated again and closure was performed using interrupted #1 Braylon followed by an injection  of Zen relief x 2 vials and then subcutaneous tissue was performed with 0 Monocryl and a second layer.  Staples reapproximate the skin  Sterile dressing was applied tourniquet was released Cryo/Cuff was applied  The patient was taken to recovery room for postop saphenous nerve block  PHYSICIAN ASSISTANT:   ASSISTANTS: NICKI COX   ANESTHESIA:   spinal, general, and POST OP SAPHENOUS NERVE BLOCK   EBL:  MIN   BLOOD ADMINISTERED:none  DRAINS: none   LOCAL MEDICATIONS USED:  OTHER ZYNRELEF 2 VIALS   SPECIMEN:  No Specimen  DISPOSITION OF SPECIMEN:  N/A  COUNTS:  YES  TOURNIQUET:   Total Tourniquet Time Documented: Thigh (Left) - 97 minutes Total: Thigh (Left) - 97 minutes   DICTATION: .Viviann Spare Dictation and Note written in EPIC  PLAN OF CARE: Admit for overnight observation  PATIENT DISPOSITION:  PACU - hemodynamically stable.   Delay start of Pharmacological VTE agent (>24hrs) due to surgical blood loss or risk of bleeding: yes

## 2022-03-10 NOTE — Brief Op Note (Signed)
03/10/2022  10:05 AM  PATIENT:  William Farley  61 y.o. male  PRE-OPERATIVE DIAGNOSIS:  left knee osteoarthritis  POST-OPERATIVE DIAGNOSIS:  left knee osteoarthritis  PROCEDURE:  Procedure(s): TOTAL KNEE ARTHROPLASTY (Left)  SURGEON:  Surgeon(s) and Role:    Carole Civil, MD - Primary  PHYSICIAN ASSISTANT:   ASSISTANTS: NICKI COX   ANESTHESIA:   spinal, general, and POST OP SAPHENOUS NERVE BLOCK   EBL:  MIN   BLOOD ADMINISTERED:none  DRAINS: none   LOCAL MEDICATIONS USED:  OTHER ZYNRELEF 2 VIALS   SPECIMEN:  No Specimen  DISPOSITION OF SPECIMEN:  N/A  COUNTS:  YES  TOURNIQUET:   Total Tourniquet Time Documented: Thigh (Left) - 97 minutes Total: Thigh (Left) - 97 minutes   DICTATION: .Viviann Spare Dictation and Note written in EPIC  PLAN OF CARE: Admit for overnight observation  PATIENT DISPOSITION:  PACU - hemodynamically stable.   Delay start of Pharmacological VTE agent (>24hrs) due to surgical blood loss or risk of bleeding: yes

## 2022-03-10 NOTE — Interval H&P Note (Signed)
History and Physical Interval Note:  03/10/2022 7:21 AM  William Farley  has presented today for surgery, with the diagnosis of left knee osteoarthritis.  The various methods of treatment have been discussed with the patient and family. After consideration of risks, benefits and other options for treatment, the patient has consented to  Procedure(s): TOTAL KNEE ARTHROPLASTY (Left) as a surgical intervention.  The patient's history has been reviewed, patient examined, no change in status, stable for surgery.  I have reviewed the patient's chart and labs.  Questions were answered to the patient's satisfaction.     Arther Abbott

## 2022-03-10 NOTE — Anesthesia Procedure Notes (Signed)
Spinal  Start time: 03/10/2022 7:40 AM End time: 03/10/2022 7:54 AM Reason for block: surgical anesthesia Staffing Performed: resident/CRNA and anesthesiologist  Anesthesiologist: [name], MD Resident/CRNA: Myna Bright, CRNA Performed by: Myna Bright, CRNA Authorized by: [name], MD   Preanesthetic Checklist Completed: patient identified, IV checked, site marked, risks and benefits discussed, surgical consent, monitors and equipment checked, pre-op evaluation and timeout performed Spinal Block Patient position: sitting Prep: ChloraPrep Patient monitoring: heart rate, cardiac monitor, continuous pulse ox and blood pressure Approach: midline Location: L3-4 Injection technique: single-shot Needle Needle type: Sprotte and Pencan  Needle gauge: 22 G Needle length: 15 cm Needle insertion depth: 10 cm Assessment Sensory level: T8 Events: CSF return and second provider Additional Notes Attempt x 1 by Ilda Mori, CRNA at L4-5 with 10 cm pencan. No CSF return. Second attempt by Dr. Briant Cedar at L3-4 with 22g Sprotte. +CSF. No complications

## 2022-03-10 NOTE — Anesthesia Procedure Notes (Signed)
Procedure Name: LMA Insertion Date/Time: 03/10/2022 8:20 AM  Performed by: Myna Bright, CRNAPre-anesthesia Checklist: Patient identified, Emergency Drugs available, Suction available and Patient being monitored Patient Re-evaluated:Patient Re-evaluated prior to induction Oxygen Delivery Method: Circle system utilized Preoxygenation: Pre-oxygenation with 100% oxygen Induction Type: IV induction Ventilation: Mask ventilation without difficulty LMA: LMA inserted LMA Size: 4.0 Number of attempts: 1 Placement Confirmation: positive ETCO2 and breath sounds checked- equal and bilateral Tube secured with: Tape Dental Injury: Teeth and Oropharynx as per pre-operative assessment

## 2022-03-10 NOTE — Plan of Care (Signed)
  Problem: Acute Rehab PT Goals(only PT should resolve) Goal: Pt Will Go Supine/Side To Sit Outcome: Progressing Flowsheets (Taken 03/10/2022 1553) Pt will go Supine/Side to Sit: with modified independence Goal: Patient Will Transfer Sit To/From Stand Outcome: Progressing Flowsheets (Taken 03/10/2022 1553) Patient will transfer sit to/from stand: with modified independence Goal: Pt Will Transfer Bed To Chair/Chair To Bed Outcome: Progressing Flowsheets (Taken 03/10/2022 1553) Pt will Transfer Bed to Chair/Chair to Bed: with modified independence Goal: Pt Will Ambulate Outcome: Progressing Flowsheets (Taken 03/10/2022 1553) Pt will Ambulate:  > 125 feet  with modified independence  with rolling walker  3:54 PM, 03/10/22 Lonell Grandchild, MPT Physical Therapist with [facility] [phone] office [phone] mobile phone

## 2022-03-11 ENCOUNTER — Telehealth: Payer: Self-pay

## 2022-03-11 ENCOUNTER — Other Ambulatory Visit (INDEPENDENT_AMBULATORY_CARE_PROVIDER_SITE_OTHER): Payer: Self-pay | Admitting: Orthopedic Surgery

## 2022-03-11 ENCOUNTER — Ambulatory Visit: Payer: Federal, State, Local not specified - PPO | Admitting: Nurse Practitioner

## 2022-03-11 DIAGNOSIS — Z79899 Other long term (current) drug therapy: Secondary | ICD-10-CM | POA: Diagnosis not present

## 2022-03-11 DIAGNOSIS — Z87891 Personal history of nicotine dependence: Secondary | ICD-10-CM | POA: Diagnosis not present

## 2022-03-11 DIAGNOSIS — Z96652 Presence of left artificial knee joint: Secondary | ICD-10-CM | POA: Diagnosis not present

## 2022-03-11 DIAGNOSIS — M1712 Unilateral primary osteoarthritis, left knee: Secondary | ICD-10-CM | POA: Diagnosis not present

## 2022-03-11 DIAGNOSIS — I480 Paroxysmal atrial fibrillation: Secondary | ICD-10-CM | POA: Diagnosis not present

## 2022-03-11 DIAGNOSIS — I2510 Atherosclerotic heart disease of native coronary artery without angina pectoris: Secondary | ICD-10-CM | POA: Diagnosis not present

## 2022-03-11 DIAGNOSIS — Z96651 Presence of right artificial knee joint: Secondary | ICD-10-CM | POA: Diagnosis not present

## 2022-03-11 LAB — CBC
HCT: 35.2 % — ABNORMAL LOW (ref 39.0–52.0)
Hemoglobin: 12.2 g/dL — ABNORMAL LOW (ref 13.0–17.0)
MCH: 31.1 pg (ref 26.0–34.0)
MCHC: 34.7 g/dL (ref 30.0–36.0)
MCV: 89.8 fL (ref 80.0–100.0)
Platelets: 136 10*3/uL — ABNORMAL LOW (ref 150–400)
RBC: 3.92 MIL/uL — ABNORMAL LOW (ref 4.22–5.81)
RDW: 13.2 % (ref 11.5–15.5)
WBC: 7.2 10*3/uL (ref 4.0–10.5)
nRBC: 0 % (ref 0.0–0.2)

## 2022-03-11 LAB — BASIC METABOLIC PANEL
Anion gap: 6 (ref 5–15)
BUN: 10 mg/dL (ref 6–20)
CO2: 23 mmol/L (ref 22–32)
Calcium: 8 mg/dL — ABNORMAL LOW (ref 8.9–10.3)
Chloride: 107 mmol/L (ref 98–111)
Creatinine, Ser: 0.82 mg/dL (ref 0.61–1.24)
GFR, Estimated: >60 mL/min (ref >60)
Glucose, Bld: 149 mg/dL — ABNORMAL HIGH (ref 70–99)
Potassium: 3.9 mmol/L (ref 3.5–5.1)
Sodium: 136 mmol/L (ref 135–145)

## 2022-03-11 MED ORDER — OXYCODONE HCL 5 MG PO TABS: 5.0000 mg | ORAL_TABLET | ORAL | 0 refills | Status: AC

## 2022-03-11 MED ORDER — ASPIRIN 325 MG PO TBEC: 325.0000 mg | DELAYED_RELEASE_TABLET | Freq: Every day | ORAL | 0 refills | Status: DC

## 2022-03-11 MED ORDER — IBUPROFEN 800 MG PO TABS: 800.0000 mg | ORAL_TABLET | Freq: Three times a day (TID) | ORAL | 1 refills | Status: DC | PRN

## 2022-03-11 MED ORDER — METHOCARBAMOL 500 MG PO TABS: 500.0000 mg | ORAL_TABLET | Freq: Four times a day (QID) | ORAL | 0 refills | Status: AC | PRN

## 2022-03-11 MED ORDER — POLYETHYLENE GLYCOL 3350 17 G PO PACK: 17.0000 g | PACK | Freq: Every day | ORAL | 0 refills | Status: DC

## 2022-03-11 MED ORDER — BISACODYL 5 MG PO TBEC: 5.0000 mg | DELAYED_RELEASE_TABLET | Freq: Every day | ORAL | 0 refills | Status: DC | PRN

## 2022-03-11 NOTE — Telephone Encounter (Signed)
Patient's wife is here in the office needing a prior auth for Oxycodone 5 mg immediate release at [facility] on [address]. He also needs a RX for a walker. DOS 03/10/22

## 2022-03-11 NOTE — Telephone Encounter (Signed)
Auth submitted on [employer].  Pending with insurance.

## 2022-03-11 NOTE — Progress Notes (Signed)
Physical Therapy Treatment Patient Details Name: William Farley MRN: [id number] DOB: 11-11-1960 Today's Date: 03/11/2022   LEFT KNEE ROM:  0 - 106 degrees AMBULATION DISTANCE: 200 feet using RW with Modified Independent   History of Present Illness William Farley is a 61 y/o male s/p Left TKA on 03/10/22 with the diagnosis of left knee osteoarthritis.    PT Comments    Patient demonstrates increased endurance/distance for gait training with fair/good return for left heel to toe stepping, good return for going up/down 5 steps using 1 side rail without loss of balance with understanding acknowledged.  Patient tolerated sitting up in chair with LLE dangling after therapy.  Patient will benefit from continued skilled physical therapy in hospital and recommended venue below to increase strength, balance, endurance for safe ADLs and gait.    Recommendations for follow up therapy are one component of a multi-disciplinary discharge planning process, led by the attending physician.  Recommendations may be updated based on patient status, additional functional criteria and insurance authorization.  Follow Up Recommendations  Home health PT     Assistance Recommended at Discharge Set up Supervision/Assistance  Patient can return home with the following A little help with walking and/or transfers;A little help with bathing/dressing/bathroom;Help with stairs or ramp for entrance;Assistance with cooking/housework   Equipment Recommendations  Rolling walker (2 wheels);BSC/3in1    Recommendations for Other Services       Precautions / Restrictions Precautions Precautions: Fall Restrictions Weight Bearing Restrictions: Yes LLE Weight Bearing: Weight bearing as tolerated     Mobility  Bed Mobility Overal bed mobility: Modified Independent                  Transfers Overall transfer level: Modified independent                 General transfer comment: good return for  sit to stands from bedside and transferring to/from chair    Ambulation/Gait Ambulation/Gait assistance: Modified independent (Device/Increase time) Gait Distance (Feet): 200 Feet Assistive device: Rolling walker (2 wheels) Gait Pattern/deviations: Decreased step length - right, Decreased step length - left, Decreased stride length, Antalgic Gait velocity: decreased     General Gait Details: increased endurance/distance for gait training with fair/good return for left heel to toe stepping with pain at baseline, no loss of balance   Stairs Stairs: Yes Stairs assistance: Modified independent (Device/Increase time) Stair Management: One rail Right, Step to pattern Number of Stairs: 5 General stair comments: demonstrates good return for going up/down steps in stairwell using 1 siderail without loss of balance and understanding acknowledged   Wheelchair Mobility    Modified Rankin (Stroke Patients Only)       Balance Overall balance assessment: Needs assistance Sitting-balance support: Feet supported, No upper extremity supported Sitting balance-Leahy Scale: Good Sitting balance - Comments: seated at EOB   Standing balance support: During functional activity, Bilateral upper extremity supported Standing balance-Leahy Scale: Good Standing balance comment: using RW                            Cognition Arousal/Alertness: Awake/alert Behavior During Therapy: WFL for tasks assessed/performed Overall Cognitive Status: Within Functional Limits for tasks assessed                                          Exercises  General Comments        Pertinent Vitals/Pain Pain Assessment Pain Assessment: 0-10 Pain Score: 6  Pain Location: left knee Pain Descriptors / Indicators: Sore Pain Intervention(s): Limited activity within patient's tolerance, Monitored during session, Repositioned    Home Living                          Prior  Function            PT Goals (current goals can now be found in the care plan section) Acute Rehab PT Goals Patient Stated Goal: return home with family to assist PT Goal Formulation: With patient Time For Goal Achievement: 03/12/22 Potential to Achieve Goals: Good Progress towards PT goals: Progressing toward goals    Frequency    BID      PT Plan Current plan remains appropriate    Co-evaluation              AM-PAC PT "6 Clicks" Mobility   Outcome Measure  Help needed turning from your back to your side while in a flat bed without using bedrails?: None Help needed moving from lying on your back to sitting on the side of a flat bed without using bedrails?: None Help needed moving to and from a bed to a chair (including a wheelchair)?: None Help needed standing up from a chair using your arms (e.g., wheelchair or bedside chair)?: None Help needed to walk in hospital room?: A Little Help needed climbing 3-5 steps with a railing? : A Little 6 Click Score: 22    End of Session   Activity Tolerance: Patient tolerated treatment well Patient left: in chair;with call bell/phone within reach Nurse Communication: Mobility status PT Visit Diagnosis: Unsteadiness on feet (R26.81);Other abnormalities of gait and mobility (R26.89);Muscle weakness (generalized) (M62.81)     Time: 6010-9323 PT Time Calculation (min) (ACUTE ONLY): 18 min  Charges:  $Gait Training: 8-22 mins                     9:50 AM, 03/11/22 Lonell Grandchild, MPT Physical Therapist with [facility] [phone] office [phone] mobile phone  03/11/2022, 9:47 AM

## 2022-03-11 NOTE — Discharge Summary (Signed)
Physician Discharge Summary  Patient ID: William Farley MRN: [id number] DOB/AGE: [birth year] 59 y.o.  Admit date: 03/10/2022 Discharge date: 03/11/2022  Admission Diagnoses: Osteoarthritis left knee  Discharge Diagnoses: Osteoarthritis left knee  Discharged Condition: Stable   Procedure: Left total knee arthroplasty  Hospital Course:  The patient had uncomplicated surgery on his left knee on March 10, 2022.  He underwent left total knee arthroplasty with spinal and general anesthesia and a saphenous nerve block  He had a DePuy fixed-bearing posterior stabilized attune total knee with a size 7 femur size 7 tibia size 6 polyethylene insert and a 38 patella  He walked 100 feet day 1 he had 100 degree range of motion.  On day 2 he was afebrile vital signs were stable hemoglobin was 12 lab work was normal he was neurovascular intact awake alert and oriented x 3  BP 130/81 (BP Location: Left Arm)   Pulse 65   Temp 97.6 F (36.4 C) (Oral)   Resp 19   SpO2 98%      Disposition: Discharge disposition: 01-Home or Self Care       Discharge Instructions     CPM   Complete by: As directed    Continuous passive motion machine (CPM):      Use the CPM from 0 to 90 for 6 hours per day.      You may increase by 10 per day.  You may break it up into 2 or 3 sessions per day.      Use CPM for 2 weeks or until you are told to stop.   Call MD / Call 911   Complete by: As directed    If you experience chest pain or shortness of breath, CALL 911 and be transported to the hospital emergency room.  If you develope a fever above 101 F, pus (white drainage) or increased drainage or redness at the wound, or calf pain, call your surgeon's office.   Change dressing   Complete by: As directed    You do not have to change the dressing   Constipation Prevention   Complete by: As directed    Drink plenty of fluids.  Prune juice may be helpful.  You may use a stool softener, such as Colace  (over the counter) 100 mg twice a day.  Use MiraLax (over the counter) for constipation as needed.   Diet - low sodium heart healthy   Complete by: As directed    Discharge instructions   Complete by: As directed    Placed the blue foam pad under your heel twice a day for 30 minutes.  You will do this for 14 days   Driving restrictions   Complete by: As directed    No driving for 1 weeks   Increase activity slowly as tolerated   Complete by: As directed    Post-operative opioid taper instructions:   Complete by: As directed    POST-OPERATIVE OPIOID TAPER INSTRUCTIONS: It is important to wean off of your opioid medication as soon as possible. If you do not need pain medication after your surgery it is ok to stop day one. Opioids include: Codeine, Hydrocodone(Norco, Vicodin), Oxycodone(Percocet, oxycontin) and hydromorphone amongst others.  Long term and even short term use of opiods can cause: Increased pain response Dependence Constipation Depression Respiratory depression And more.  Withdrawal symptoms can include Flu like symptoms Nausea, vomiting And more Techniques to manage these symptoms Hydrate well Eat regular healthy meals Stay active Use relaxation  techniques(deep breathing, meditating, yoga) Do Not substitute Alcohol to help with tapering If you have been on opioids for less than two weeks and do not have pain than it is ok to stop all together.  Plan to wean off of opioids This plan should start within one week post op of your joint replacement. Maintain the same interval or time between taking each dose and first decrease the dose.  Cut the total daily intake of opioids by one tablet each day Next start to increase the time between doses. The last dose that should be eliminated is the evening dose.      TED hose   Complete by: As directed    Use stockings (TED hose) for 4 weeks on both leg(s).  You may remove them at night for sleeping.      Allergies as  of 03/11/2022       Reactions   Codeine Itching, Rash, Other (See Comments)        Medication List     STOP taking these medications    HYDROcodone-acetaminophen 5-325 MG tablet Commonly known as: NORCO/VICODIN       TAKE these medications    aspirin EC 325 MG tablet Take 1 tablet (325 mg total) by mouth daily with breakfast.   bisacodyl 5 MG EC tablet Commonly known as: DULCOLAX Take 1 tablet (5 mg total) by mouth daily as needed for moderate constipation.   DULoxetine 60 MG capsule Commonly known as: CYMBALTA Take 60 mg by mouth 2 (two) times daily.   ibuprofen 800 MG tablet Commonly known as: ADVIL Take 1 tablet (800 mg total) by mouth every 8 (eight) hours as needed.   IRON PO Take 55 mg by mouth at bedtime.   LORazepam 1 MG tablet Commonly known as: ATIVAN Take 1 mg by mouth 3 (three) times daily as needed for anxiety.   methocarbamol 500 MG tablet Commonly known as: ROBAXIN Take 1 tablet (500 mg total) by mouth every 6 (six) hours as needed for up to 14 days for muscle spasms.   metoprolol succinate 25 MG 24 hr tablet Commonly known as: TOPROL-XL TAKE 1 TABLET(25 MG) BY MOUTH DAILY   NON FORMULARY Pt uses a cpap nightly   omeprazole 20 MG capsule Commonly known as: PRILOSEC Take 20 mg by mouth in the morning.   oxyCODONE 5 MG immediate release tablet Commonly known as: Oxy IR/ROXICODONE Take 1 tablet (5 mg total) by mouth every 4 (four) hours for 7 days.   polyethylene glycol 17 g packet Commonly known as: MIRALAX / GLYCOLAX Take 17 g by mouth daily.   VITAMIN B12 PO Take 1 tablet by mouth every other day.   VITAMIN D3 PO Take 7,500 mcg by mouth daily.   zolpidem 10 MG tablet Commonly known as: AMBIEN Take 10 mg by mouth at bedtime.               Durable Medical Equipment  (From admission, onward)           Start     Ordered   03/10/22 1559  For home use only DME 3 n 1  Once        03/10/22 1558   03/10/22 1557  For  home use only DME Walker rolling  Once       Comments: Patient s/p Left TKA, unsteady on feet with increased left knee pain without AD, requires use of RW for safety, pain control with good return for using demonstrated  Question Answer Comment  Walker: With Proctor   Patient needs a walker to treat with the following condition Gait difficulty      03/10/22 1558              Discharge Care Instructions  (From admission, onward)           Start     Ordered   03/11/22 0000  Change dressing       Comments: You do not have to change the dressing   03/11/22 1000            Follow-up Information     Carole Civil, MD Follow up on 03/25/2022.   Specialties: Orthopedic Surgery, Radiology Contact information: [address] [place] [address] [phone]                 Signed: Arther Abbott 03/11/2022, 10:00 AM

## 2022-03-11 NOTE — Anesthesia Procedure Notes (Signed)
Anesthesia Regional Block: Adductor canal block   Pre-Anesthetic Checklist: , timeout performed,  Correct Patient, Correct Site, Correct Laterality,  Correct Procedure, Correct Position, site marked,  Risks and benefits discussed,  Surgical consent,  Pre-op evaluation,  At surgeon's request and post-op pain management  Laterality: Left  Prep: chloraprep       Needles:   Needle Type: Stimiplex     Needle Length: 10cm  Needle Gauge: 22     Additional Needles:   Procedures:,,,, ultrasound used (permanent image in chart),,    Narrative:  Start time: 03/10/2022 10:35 AM End time: 03/10/2022 10:41 AM Injection made incrementally with aspirations every 5 mL.

## 2022-03-11 NOTE — Progress Notes (Signed)
Patient ID: William Farley, male   DOB: 27-May-1960, 61 y.o.   MRN: [id number]   This patient is postop day 1 from a left total knee arthroplasty  He is doing well he says he has some knee soreness but not a lot of pain  His vital signs are stable his lab work looks good  He can flex his knee move his foot no signs of DVT he is awake alert and oriented x 3  We expect discharge today if he clears physical therapy parameters

## 2022-03-11 NOTE — Anesthesia Postprocedure Evaluation (Signed)
Anesthesia Post Note  Patient: William Farley  Procedure(s) Performed: TOTAL KNEE ARTHROPLASTY (Left: Knee)  Patient location during evaluation: Phase II Anesthesia Type: Spinal Level of consciousness: awake Pain management: pain level controlled Vital Signs Assessment: post-procedure vital signs reviewed and stable Respiratory status: spontaneous breathing and respiratory function stable Cardiovascular status: blood pressure returned to baseline and stable Postop Assessment: no headache and no apparent nausea or vomiting Anesthetic complications: no Comments: Late entry   No notable events documented.   Last Vitals:  Vitals:   03/11/22 0241 03/11/22 0555  BP: 138/88 130/81  Pulse: 68 65  Resp: 20 19  Temp: 37.2 C 36.4 C  SpO2: 96% 98%    Last Pain:  Vitals:   03/11/22 0555  TempSrc: Oral  PainSc:                  [name]

## 2022-03-12 DIAGNOSIS — G894 Chronic pain syndrome: Secondary | ICD-10-CM | POA: Diagnosis not present

## 2022-03-12 DIAGNOSIS — F419 Anxiety disorder, unspecified: Secondary | ICD-10-CM | POA: Diagnosis not present

## 2022-03-12 DIAGNOSIS — G4730 Sleep apnea, unspecified: Secondary | ICD-10-CM | POA: Diagnosis not present

## 2022-03-12 DIAGNOSIS — K219 Gastro-esophageal reflux disease without esophagitis: Secondary | ICD-10-CM | POA: Diagnosis not present

## 2022-03-12 DIAGNOSIS — Z96652 Presence of left artificial knee joint: Secondary | ICD-10-CM | POA: Diagnosis not present

## 2022-03-12 DIAGNOSIS — F515 Nightmare disorder: Secondary | ICD-10-CM | POA: Diagnosis not present

## 2022-03-12 DIAGNOSIS — Z471 Aftercare following joint replacement surgery: Secondary | ICD-10-CM | POA: Diagnosis not present

## 2022-03-12 DIAGNOSIS — F4312 Post-traumatic stress disorder, chronic: Secondary | ICD-10-CM | POA: Diagnosis not present

## 2022-03-12 DIAGNOSIS — F32A Depression, unspecified: Secondary | ICD-10-CM | POA: Diagnosis not present

## 2022-03-12 DIAGNOSIS — F1320 Sedative, hypnotic or anxiolytic dependence, uncomplicated: Secondary | ICD-10-CM | POA: Diagnosis not present

## 2022-03-12 DIAGNOSIS — G4700 Insomnia, unspecified: Secondary | ICD-10-CM | POA: Diagnosis not present

## 2022-03-12 DIAGNOSIS — M1990 Unspecified osteoarthritis, unspecified site: Secondary | ICD-10-CM | POA: Diagnosis not present

## 2022-03-12 DIAGNOSIS — I2510 Atherosclerotic heart disease of native coronary artery without angina pectoris: Secondary | ICD-10-CM | POA: Diagnosis not present

## 2022-03-12 DIAGNOSIS — I480 Paroxysmal atrial fibrillation: Secondary | ICD-10-CM | POA: Diagnosis not present

## 2022-03-12 DIAGNOSIS — D696 Thrombocytopenia, unspecified: Secondary | ICD-10-CM | POA: Diagnosis not present

## 2022-03-13 ENCOUNTER — Encounter: Payer: Self-pay | Admitting: Orthopedic Surgery

## 2022-03-13 ENCOUNTER — Ambulatory Visit (INDEPENDENT_AMBULATORY_CARE_PROVIDER_SITE_OTHER): Payer: Federal, State, Local not specified - PPO | Admitting: Orthopedic Surgery

## 2022-03-13 DIAGNOSIS — Z96652 Presence of left artificial knee joint: Secondary | ICD-10-CM

## 2022-03-13 DIAGNOSIS — M1712 Unilateral primary osteoarthritis, left knee: Secondary | ICD-10-CM | POA: Diagnosis not present

## 2022-03-13 NOTE — Progress Notes (Signed)
Chief Complaint  Patient presents with   Knee Pain    03/10/22 TKA left, having concerns about blood in dressing.     Mr. Wetzel Bjornstad was seen by therapy yesterday had 95 degrees of flexion but they were concerned about the bleeding and around his dressing and the swelling  So he was asked to come in to get it checked  We change the dressing he did have some bleeding on the dressing and it soaked through the dressing to some degree.  There is no bleeding once dressing was removed  He does have some ecchymosis around the knee and some swelling around the leg.  The calf is soft and supple DVT signs are negative  The dressings were changed  Wednesday 8 days postop he can use Saran wrap and takes a shower we given some dressings to change if needed  He has an appointment January 3 for staples to be removed and his PT appointment is set for January 4

## 2022-03-17 DIAGNOSIS — D696 Thrombocytopenia, unspecified: Secondary | ICD-10-CM | POA: Diagnosis not present

## 2022-03-17 DIAGNOSIS — F32A Depression, unspecified: Secondary | ICD-10-CM | POA: Diagnosis not present

## 2022-03-17 DIAGNOSIS — Z471 Aftercare following joint replacement surgery: Secondary | ICD-10-CM | POA: Diagnosis not present

## 2022-03-17 DIAGNOSIS — F1320 Sedative, hypnotic or anxiolytic dependence, uncomplicated: Secondary | ICD-10-CM | POA: Diagnosis not present

## 2022-03-17 DIAGNOSIS — K219 Gastro-esophageal reflux disease without esophagitis: Secondary | ICD-10-CM | POA: Diagnosis not present

## 2022-03-17 DIAGNOSIS — G894 Chronic pain syndrome: Secondary | ICD-10-CM | POA: Diagnosis not present

## 2022-03-17 DIAGNOSIS — G4700 Insomnia, unspecified: Secondary | ICD-10-CM | POA: Diagnosis not present

## 2022-03-17 DIAGNOSIS — M1990 Unspecified osteoarthritis, unspecified site: Secondary | ICD-10-CM | POA: Diagnosis not present

## 2022-03-17 DIAGNOSIS — Z96652 Presence of left artificial knee joint: Secondary | ICD-10-CM | POA: Diagnosis not present

## 2022-03-17 DIAGNOSIS — F515 Nightmare disorder: Secondary | ICD-10-CM | POA: Diagnosis not present

## 2022-03-17 DIAGNOSIS — F419 Anxiety disorder, unspecified: Secondary | ICD-10-CM | POA: Diagnosis not present

## 2022-03-17 DIAGNOSIS — I2510 Atherosclerotic heart disease of native coronary artery without angina pectoris: Secondary | ICD-10-CM | POA: Diagnosis not present

## 2022-03-17 DIAGNOSIS — I480 Paroxysmal atrial fibrillation: Secondary | ICD-10-CM | POA: Diagnosis not present

## 2022-03-17 DIAGNOSIS — G4730 Sleep apnea, unspecified: Secondary | ICD-10-CM | POA: Diagnosis not present

## 2022-03-17 DIAGNOSIS — F4312 Post-traumatic stress disorder, chronic: Secondary | ICD-10-CM | POA: Diagnosis not present

## 2022-03-18 ENCOUNTER — Encounter (HOSPITAL_COMMUNITY): Payer: Self-pay | Admitting: Orthopedic Surgery

## 2022-03-18 DIAGNOSIS — K219 Gastro-esophageal reflux disease without esophagitis: Secondary | ICD-10-CM | POA: Diagnosis not present

## 2022-03-18 DIAGNOSIS — G4700 Insomnia, unspecified: Secondary | ICD-10-CM | POA: Diagnosis not present

## 2022-03-18 DIAGNOSIS — Z96652 Presence of left artificial knee joint: Secondary | ICD-10-CM | POA: Diagnosis not present

## 2022-03-18 DIAGNOSIS — F515 Nightmare disorder: Secondary | ICD-10-CM | POA: Diagnosis not present

## 2022-03-18 DIAGNOSIS — D696 Thrombocytopenia, unspecified: Secondary | ICD-10-CM | POA: Diagnosis not present

## 2022-03-18 DIAGNOSIS — Z471 Aftercare following joint replacement surgery: Secondary | ICD-10-CM | POA: Diagnosis not present

## 2022-03-18 DIAGNOSIS — F32A Depression, unspecified: Secondary | ICD-10-CM | POA: Diagnosis not present

## 2022-03-18 DIAGNOSIS — I480 Paroxysmal atrial fibrillation: Secondary | ICD-10-CM | POA: Diagnosis not present

## 2022-03-18 DIAGNOSIS — G4730 Sleep apnea, unspecified: Secondary | ICD-10-CM | POA: Diagnosis not present

## 2022-03-18 DIAGNOSIS — F4312 Post-traumatic stress disorder, chronic: Secondary | ICD-10-CM | POA: Diagnosis not present

## 2022-03-18 DIAGNOSIS — M1990 Unspecified osteoarthritis, unspecified site: Secondary | ICD-10-CM | POA: Diagnosis not present

## 2022-03-18 DIAGNOSIS — F1320 Sedative, hypnotic or anxiolytic dependence, uncomplicated: Secondary | ICD-10-CM | POA: Diagnosis not present

## 2022-03-18 DIAGNOSIS — G894 Chronic pain syndrome: Secondary | ICD-10-CM | POA: Diagnosis not present

## 2022-03-18 DIAGNOSIS — I2510 Atherosclerotic heart disease of native coronary artery without angina pectoris: Secondary | ICD-10-CM | POA: Diagnosis not present

## 2022-03-18 DIAGNOSIS — F419 Anxiety disorder, unspecified: Secondary | ICD-10-CM | POA: Diagnosis not present

## 2022-03-20 DIAGNOSIS — G894 Chronic pain syndrome: Secondary | ICD-10-CM | POA: Diagnosis not present

## 2022-03-20 DIAGNOSIS — G4700 Insomnia, unspecified: Secondary | ICD-10-CM | POA: Diagnosis not present

## 2022-03-20 DIAGNOSIS — Z471 Aftercare following joint replacement surgery: Secondary | ICD-10-CM | POA: Diagnosis not present

## 2022-03-20 DIAGNOSIS — F515 Nightmare disorder: Secondary | ICD-10-CM | POA: Diagnosis not present

## 2022-03-20 DIAGNOSIS — G4730 Sleep apnea, unspecified: Secondary | ICD-10-CM | POA: Diagnosis not present

## 2022-03-20 DIAGNOSIS — I2510 Atherosclerotic heart disease of native coronary artery without angina pectoris: Secondary | ICD-10-CM | POA: Diagnosis not present

## 2022-03-20 DIAGNOSIS — F1320 Sedative, hypnotic or anxiolytic dependence, uncomplicated: Secondary | ICD-10-CM | POA: Diagnosis not present

## 2022-03-20 DIAGNOSIS — I480 Paroxysmal atrial fibrillation: Secondary | ICD-10-CM | POA: Diagnosis not present

## 2022-03-20 DIAGNOSIS — F419 Anxiety disorder, unspecified: Secondary | ICD-10-CM | POA: Diagnosis not present

## 2022-03-20 DIAGNOSIS — Z96652 Presence of left artificial knee joint: Secondary | ICD-10-CM | POA: Diagnosis not present

## 2022-03-20 DIAGNOSIS — D696 Thrombocytopenia, unspecified: Secondary | ICD-10-CM | POA: Diagnosis not present

## 2022-03-20 DIAGNOSIS — F32A Depression, unspecified: Secondary | ICD-10-CM | POA: Diagnosis not present

## 2022-03-20 DIAGNOSIS — M1990 Unspecified osteoarthritis, unspecified site: Secondary | ICD-10-CM | POA: Diagnosis not present

## 2022-03-20 DIAGNOSIS — K219 Gastro-esophageal reflux disease without esophagitis: Secondary | ICD-10-CM | POA: Diagnosis not present

## 2022-03-20 DIAGNOSIS — F4312 Post-traumatic stress disorder, chronic: Secondary | ICD-10-CM | POA: Diagnosis not present

## 2022-03-23 DIAGNOSIS — Z96652 Presence of left artificial knee joint: Secondary | ICD-10-CM | POA: Diagnosis not present

## 2022-03-23 DIAGNOSIS — M1712 Unilateral primary osteoarthritis, left knee: Secondary | ICD-10-CM | POA: Diagnosis not present

## 2022-03-24 DIAGNOSIS — I2510 Atherosclerotic heart disease of native coronary artery without angina pectoris: Secondary | ICD-10-CM | POA: Diagnosis not present

## 2022-03-24 DIAGNOSIS — G4730 Sleep apnea, unspecified: Secondary | ICD-10-CM | POA: Diagnosis not present

## 2022-03-24 DIAGNOSIS — Z471 Aftercare following joint replacement surgery: Secondary | ICD-10-CM | POA: Diagnosis not present

## 2022-03-24 DIAGNOSIS — F1320 Sedative, hypnotic or anxiolytic dependence, uncomplicated: Secondary | ICD-10-CM | POA: Diagnosis not present

## 2022-03-24 DIAGNOSIS — F4312 Post-traumatic stress disorder, chronic: Secondary | ICD-10-CM | POA: Diagnosis not present

## 2022-03-24 DIAGNOSIS — F515 Nightmare disorder: Secondary | ICD-10-CM | POA: Diagnosis not present

## 2022-03-24 DIAGNOSIS — M1990 Unspecified osteoarthritis, unspecified site: Secondary | ICD-10-CM | POA: Diagnosis not present

## 2022-03-24 DIAGNOSIS — I480 Paroxysmal atrial fibrillation: Secondary | ICD-10-CM | POA: Diagnosis not present

## 2022-03-24 DIAGNOSIS — Z96652 Presence of left artificial knee joint: Secondary | ICD-10-CM | POA: Diagnosis not present

## 2022-03-24 DIAGNOSIS — G4700 Insomnia, unspecified: Secondary | ICD-10-CM | POA: Diagnosis not present

## 2022-03-24 DIAGNOSIS — D696 Thrombocytopenia, unspecified: Secondary | ICD-10-CM | POA: Diagnosis not present

## 2022-03-24 DIAGNOSIS — G894 Chronic pain syndrome: Secondary | ICD-10-CM | POA: Diagnosis not present

## 2022-03-24 DIAGNOSIS — K219 Gastro-esophageal reflux disease without esophagitis: Secondary | ICD-10-CM | POA: Diagnosis not present

## 2022-03-24 DIAGNOSIS — F32A Depression, unspecified: Secondary | ICD-10-CM | POA: Diagnosis not present

## 2022-03-24 DIAGNOSIS — F419 Anxiety disorder, unspecified: Secondary | ICD-10-CM | POA: Diagnosis not present

## 2022-03-25 ENCOUNTER — Ambulatory Visit (INDEPENDENT_AMBULATORY_CARE_PROVIDER_SITE_OTHER): Payer: Federal, State, Local not specified - PPO | Admitting: Orthopedic Surgery

## 2022-03-25 ENCOUNTER — Encounter: Payer: Self-pay | Admitting: Orthopedic Surgery

## 2022-03-25 DIAGNOSIS — Z96652 Presence of left artificial knee joint: Secondary | ICD-10-CM

## 2022-03-25 MED ORDER — OXYCODONE HCL 5 MG PO CAPS: 5.0000 mg | ORAL_CAPSULE | Freq: Four times a day (QID) | ORAL | 0 refills | Status: DC | PRN

## 2022-03-25 NOTE — Progress Notes (Signed)
Chief Complaint  Patient presents with   Post-op Follow-up    03/10/22 left knee replacement    Medication Refill    Oxycodone/ needs 5 day supply of pain meds      Encounter Diagnosis  Name Primary?   Status post total left knee replacement 03/10/22 Yes   Today we removed the staples   The wound looks great   The swelling and bruising resolved   He can walk w/o support   Return in 3 weeks   Start tapering the opioids   Meds ordered this encounter  Medications   oxycodone (OXY-IR) 5 MG capsule    Sig: Take 1 capsule (5 mg total) by mouth every 6 (six) hours as needed for up to 5 days.    Dispense:  20 capsule    Refill:  0

## 2022-03-26 ENCOUNTER — Other Ambulatory Visit: Payer: Self-pay | Admitting: Orthopedic Surgery

## 2022-03-26 ENCOUNTER — Other Ambulatory Visit: Payer: Self-pay | Admitting: Radiology

## 2022-03-26 ENCOUNTER — Ambulatory Visit (HOSPITAL_COMMUNITY): Payer: Federal, State, Local not specified - PPO | Attending: Orthopedic Surgery | Admitting: Physical Therapy

## 2022-03-26 DIAGNOSIS — M25562 Pain in left knee: Secondary | ICD-10-CM | POA: Diagnosis not present

## 2022-03-26 DIAGNOSIS — R2689 Other abnormalities of gait and mobility: Secondary | ICD-10-CM | POA: Insufficient documentation

## 2022-03-26 DIAGNOSIS — M25662 Stiffness of left knee, not elsewhere classified: Secondary | ICD-10-CM | POA: Insufficient documentation

## 2022-03-26 DIAGNOSIS — Z01818 Encounter for other preprocedural examination: Secondary | ICD-10-CM | POA: Diagnosis not present

## 2022-03-26 DIAGNOSIS — M1712 Unilateral primary osteoarthritis, left knee: Secondary | ICD-10-CM | POA: Diagnosis not present

## 2022-03-26 DIAGNOSIS — Z96652 Presence of left artificial knee joint: Secondary | ICD-10-CM

## 2022-03-26 MED ORDER — OXYCODONE HCL 5 MG PO TABS: 5.0000 mg | ORAL_TABLET | ORAL | 0 refills | Status: DC | PRN

## 2022-03-26 MED ORDER — OXYCODONE HCL 5 MG PO TABS: 5.0000 mg | ORAL_TABLET | Freq: Four times a day (QID) | ORAL | 0 refills | Status: AC | PRN

## 2022-03-26 NOTE — Therapy (Signed)
OUTPATIENT PHYSICAL THERAPY LOWER EXTREMITY EVALUATION   Patient Name: William Farley MRN: [id number] DOB:Jan 22, 1961, 62 y.o., male Today's Date: 03/26/2022  END OF SESSION:  PT End of Session - 03/26/22 1149     Visit Number 1    Number of Visits 18    Date for PT Re-Evaluation 05/07/22    Authorization Type [employer]BS Federal    Progress Note Due on Visit 10    PT Start Time 1117    PT Stop Time 1157    PT Time Calculation (min) 40 min    Activity Tolerance Patient tolerated treatment well    Behavior During Therapy WFL for tasks assessed/performed             Past Medical History:  Diagnosis Date   Anxiety    Arthritis    CAD (coronary artery disease)    Minimal Non-obstructive CAD seen on coronary CTA 04/2021   Depression    GERD (gastroesophageal reflux disease)    History of kidney stones    History of thrombocytopenia    Hx of syncope    PAF (paroxysmal atrial fibrillation) (HCC)    ChadsVasc Score is 0.  Monitor revealed less than 1% PAF burden.   Seizure ([place]) 02/2021   Sleep apnea    wears CPAP    Past Surgical History:  Procedure Laterality Date   APPENDECTOMY     BIOPSY  05/01/2021   Procedure: BIOPSY;  Surgeon: Rogene Houston, MD;  [facility];  Service: Endoscopy;;   CERVICAL SPINE SURGERY  2018   CHOLECYSTECTOMY     COLONOSCOPY WITH PROPOFOL N/A 05/01/2021   Procedure: COLONOSCOPY WITH PROPOFOL;  Surgeon: Rogene Houston, MD;  [facility];  Service: Endoscopy;  Laterality: N/A;  1050   CYST EXCISION     CYSTOSCOPY     CYSTOSCOPY WITH RETROGRADE PYELOGRAM, URETEROSCOPY AND STENT PLACEMENT Right 08/08/2020   Procedure: CYSTOSCOPY WITH RIGHT RETROGRADE PYELOGRAM AND RIGHT URETEROSCOPY;  Surgeon: Cleon Gustin, MD;  Location: [facility];  Service: Urology;  Laterality: Right;   EXTRACORPOREAL SHOCK WAVE LITHOTRIPSY Right 05/21/2020   Procedure: EXTRACORPOREAL SHOCK WAVE LITHOTRIPSY (ESWL);  Surgeon: Cleon Gustin, MD;   Location: [facility];  Service: Urology;  Laterality: Right;   GASTRIC BYPASS OPEN  2003   INCISIONAL HERNIA REPAIR     kidney stone removal     KNEE ARTHROSCOPY Left    multiple   NECK SURGERY     POLYPECTOMY  05/01/2021   Procedure: POLYPECTOMY;  Surgeon: Rogene Houston, MD;  [facility];  Service: Endoscopy;;   REPLACEMENT TOTAL KNEE Right    RHINOPLASTY     STONE EXTRACTION WITH BASKET Right 08/08/2020   Procedure: STONE EXTRACTION WITH BASKET;  Surgeon: Cleon Gustin, MD;  Location: [facility];  Service: Urology;  Laterality: Right;   TOTAL KNEE ARTHROPLASTY Left 03/10/2022   Procedure: TOTAL KNEE ARTHROPLASTY;  Surgeon: Carole Civil, MD;  Location: [facility];  Service: Orthopedics;  Laterality: Left;   Patient Active Problem List   Diagnosis Date Noted   Primary osteoarthritis of left knee 03/10/2022   Osteoarthritis of left knee 03/10/2022   Insomnia 02/18/2022   Nightmares associated with chronic post-traumatic stress disorder 02/18/2022   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   Chronic pain syndrome 02/18/2022   Chronic post-traumatic stress disorder 02/18/2022   Difficulty controlling anger 02/18/2022   Seizures ([place]) 03/07/2021   Thrombocytopenia ([place]) 03/07/2021   Status post total knee  replacement 09/29/2013   Low serum testosterone level 03/11/2012    PCP: Garey Ham NP   REFERRING PROVIDER: Carole Civil, MD  REFERRING DIAG: [phone] (ICD-10-CM) - Unilateral primary osteoarthritis, left knee  THERAPY DIAG:  Left knee pain, unspecified chronicity - Plan: PT plan of care cert/re-cert  Stiffness of left knee, not elsewhere classified - Plan: PT plan of care cert/re-cert  Other abnormalities of gait and mobility - Plan: PT plan of care cert/re-cert  Rationale for Evaluation and Treatment: Rehabilitation  ONSET DATE: 03/10/22  SUBJECTIVE:   SUBJECTIVE STATEMENT: Patient presents to therapy with complaint of LT knee pain s/p LT TKA on  03/10/22. Patient is doing well overall. He is concerned about his incision, he feels it is "coming apart". Pain is moderate. He is no longer taking pain medication. He is walking with no AD. He did have home health for a few weeks  PERTINENT HISTORY: LT TKA 03/10/22, RT TKA 2015   PAIN:  Are you having pain? Yes: NPRS scale: 5/10 Pain location: Lt knee  Pain description: sore, aching, swollen  Aggravating factors: standing, walking, bending, WB Relieving factors: rest, non WB  PRECAUTIONS: None  WEIGHT BEARING RESTRICTIONS: No  FALLS:  Has patient fallen in last 6 months? No  LIVING ENVIRONMENT: Lives with: lives with their spouse Lives in: House/apartment Stairs: Yes: External: 1 steps; none Has following equipment at home: Single point cane and Walker - 2 wheeled  OCCUPATION: works for [employer]   PLOF: Independent  PATIENT GOALS: Have LT knee turn out as good as RT knee  NEXT MD VISIT:   OBJECTIVE:   DIAGNOSTIC FINDINGS: NA  PATIENT SURVEYS:  FOTO Complete next session   COGNITION: Overall cognitive status: Within functional limits for tasks assessed     SENSATION: WFL  EDEMA:  Mild/ moderate   PALPATION: Min TTP about incision   LOWER EXTREMITY ROM:  Active ROM Right eval Left eval  Hip flexion    Hip extension    Hip abduction    Hip adduction    Hip internal rotation    Hip external rotation    Knee flexion 125 90  Knee extension 0 -2  Ankle dorsiflexion    Ankle plantarflexion    Ankle inversion    Ankle eversion     (Blank rows = not tested)  LOWER EXTREMITY MMT:  MMT Right eval Left eval  Hip flexion 5 4+  Hip extension 4+ 4  Hip abduction 4+ 4  Hip adduction    Hip internal rotation    Hip external rotation    Knee flexion 5 4+   Knee extension 5 4  Ankle dorsiflexion 5 5  Ankle plantarflexion    Ankle inversion    Ankle eversion     (Blank rows = not tested)   FUNCTIONAL TESTS:  5 times sit to stand: 10.26 sec no UE 2  minute walk test: 450 feet with no AD   GAIT:  Decreased LT knee flexion, decreased stride   TODAY'S TREATMENT:  DATE:   03/26/22 Eval    PATIENT EDUCATION:  Education details: on Eval findings, POC and HEP Person educated: Patient Education method: Explanation Education comprehension: verbalized understanding  HOME EXERCISE PROGRAM: Access Code: [email or web address] URL: [email or web address] Date: 03/26/2022 Prepared by: Josue Hector  Exercises - Supine Bridge  - 3 x daily - 7 x weekly - 2 sets - 10 reps - Supine Active Straight Leg Raise  - 3 x daily - 7 x weekly - 2 sets - 10 reps - Supine Knee Extension Mobilization with Weight  - 3 x daily - 7 x weekly - 1 sets - 1 reps - 3-5 minutes hold  ASSESSMENT:  CLINICAL IMPRESSION: Patient is a 62 y.o. male who presents to physical therapy with complaint of LT knee pain s/p LT TKA. Patient demonstrates muscle weakness, reduced ROM, and fascial restrictions which are likely contributing to symptoms of pain and are negatively impacting patient ability to perform ADLs and functional mobility tasks. Patient will benefit from skilled physical therapy services to address these deficits to reduce pain and improve level of function with ADLs and functional mobility tasks.   OBJECTIVE IMPAIRMENTS: Abnormal gait, decreased activity tolerance, decreased balance, decreased mobility, difficulty walking, decreased ROM, decreased strength, increased edema, increased fascial restrictions, impaired flexibility, improper body mechanics, and pain.   ACTIVITY LIMITATIONS: carrying, lifting, bending, sitting, standing, squatting, sleeping, stairs, transfers, and locomotion level  PARTICIPATION LIMITATIONS: meal prep, cleaning, laundry, driving, shopping, community activity, occupation, and yard work  PERSONAL FACTORS:  NA   are also affecting patient's functional outcome.   REHAB POTENTIAL: Good  CLINICAL DECISION MAKING: Stable/uncomplicated  EVALUATION COMPLEXITY: Low   GOALS: SHORT TERM GOALS: Target date: 04/16/2022  Patient will be independent with initial HEP and self-management strategies to improve functional outcomes Baseline:  Goal status: INITIAL   LONG TERM GOALS: Target date: 05/07/2022  Patient will be independent with advanced HEP and self-management strategies to improve functional outcomes Baseline:  Goal status: INITIAL  2.  Patient will improve FOTO score to predicted value to indicate improvement in functional outcomes Baseline: Complete next session  Goal status: INITIAL  3.  Patient will have LT knee AROM 0-120 degrees to improve functional mobility and facilitate squatting to pick up items from floor. Baseline: -2 to 90 deg Goal status: INITIAL  4. Patient will have equal to or > 4+/5 MMT throughout LLE to improve ability to perform functional mobility, stair ambulation and ADLs.  Baseline: See MMT Goal status: INITIAL  PLAN:  PT FREQUENCY: 3x/week  PT DURATION: 6 weeks  PLANNED INTERVENTIONS: Therapeutic exercises, Therapeutic activity, Neuromuscular re-education, Balance training, Gait training, Patient/Family education, Joint manipulation, Joint mobilization, Stair training, Aquatic Therapy, Dry Needling, Electrical stimulation, Spinal manipulation, Spinal mobilization, Cryotherapy, Moist heat, scar mobilization, Taping, Traction, Ultrasound, Biofeedback, Ionotophoresis '4mg'$ /ml Dexamethasone, and Manual therapy.   PLAN FOR NEXT SESSION: Complete FOTO. Progress knee strength and ROM. Gait and balance, manual for pain and swelling as needed.   11:58 AM, 03/26/22 Josue Hector PT DPT  Physical Therapist with [facility]  [phone]

## 2022-03-26 NOTE — Progress Notes (Signed)
Meds ordered this encounter  Medications   oxyCODONE (OXY IR/ROXICODONE) 5 MG immediate release tablet    Sig: Take 1 tablet (5 mg total) by mouth every 6 (six) hours as needed for up to 5 days for severe pain.    Dispense:  20 tablet    Refill:  0   Tablets instead of capsules

## 2022-03-26 NOTE — Telephone Encounter (Signed)
Pharmacy wants Oxy IR changed to tablets instead of capsules They dont have capsules

## 2022-03-27 NOTE — Progress Notes (Unsigned)
Submitted prior auth for oxycodone to patients insurance plan through covermymeds.

## 2022-03-31 ENCOUNTER — Ambulatory Visit (HOSPITAL_COMMUNITY): Payer: Federal, State, Local not specified - PPO | Admitting: Physical Therapy

## 2022-03-31 DIAGNOSIS — M1712 Unilateral primary osteoarthritis, left knee: Secondary | ICD-10-CM | POA: Diagnosis not present

## 2022-03-31 DIAGNOSIS — M25562 Pain in left knee: Secondary | ICD-10-CM | POA: Diagnosis not present

## 2022-03-31 DIAGNOSIS — M25662 Stiffness of left knee, not elsewhere classified: Secondary | ICD-10-CM | POA: Diagnosis not present

## 2022-03-31 DIAGNOSIS — Z01818 Encounter for other preprocedural examination: Secondary | ICD-10-CM | POA: Diagnosis not present

## 2022-03-31 DIAGNOSIS — R2689 Other abnormalities of gait and mobility: Secondary | ICD-10-CM | POA: Diagnosis not present

## 2022-03-31 NOTE — Therapy (Signed)
OUTPATIENT PHYSICAL THERAPY TREATMENT   Patient Name: William Farley MRN: [id number] DOB:1960/05/23, 62 y.o., male Today's Date: 03/31/2022  END OF SESSION:  PT End of Session - 03/31/22 1039     Visit Number 2    Number of Visits 18    Date for PT Re-Evaluation 05/07/22    Authorization Type BCBS Federal    Progress Note Due on Visit 10    PT Start Time 1028    PT Stop Time 1110    PT Time Calculation (min) 42 min    Activity Tolerance Patient tolerated treatment well    Behavior During Therapy WFL for tasks assessed/performed             Past Medical History:  Diagnosis Date   Anxiety    Arthritis    CAD (coronary artery disease)    Minimal Non-obstructive CAD seen on coronary CTA 04/2021   Depression    GERD (gastroesophageal reflux disease)    History of kidney stones    History of thrombocytopenia    Hx of syncope    PAF (paroxysmal atrial fibrillation) (HCC)    ChadsVasc Score is 0.  Monitor revealed less than 1% PAF burden.   Seizure ([place]) 02/2021   Sleep apnea    wears CPAP    Past Surgical History:  Procedure Laterality Date   APPENDECTOMY     BIOPSY  05/01/2021   Procedure: BIOPSY;  Surgeon: Rogene Houston, MD;  [facility];  Service: Endoscopy;;   CERVICAL SPINE SURGERY  2018   CHOLECYSTECTOMY     COLONOSCOPY WITH PROPOFOL N/A 05/01/2021   Procedure: COLONOSCOPY WITH PROPOFOL;  Surgeon: Rogene Houston, MD;  [facility];  Service: Endoscopy;  Laterality: N/A;  1050   CYST EXCISION     CYSTOSCOPY     CYSTOSCOPY WITH RETROGRADE PYELOGRAM, URETEROSCOPY AND STENT PLACEMENT Right 08/08/2020   Procedure: CYSTOSCOPY WITH RIGHT RETROGRADE PYELOGRAM AND RIGHT URETEROSCOPY;  Surgeon: Cleon Gustin, MD;  Location: [facility];  Service: Urology;  Laterality: Right;   EXTRACORPOREAL SHOCK WAVE LITHOTRIPSY Right 05/21/2020   Procedure: EXTRACORPOREAL SHOCK WAVE LITHOTRIPSY (ESWL);  Surgeon: Cleon Gustin, MD;  Location: [facility];   Service: Urology;  Laterality: Right;   GASTRIC BYPASS OPEN  2003   INCISIONAL HERNIA REPAIR     kidney stone removal     KNEE ARTHROSCOPY Left    multiple   NECK SURGERY     POLYPECTOMY  05/01/2021   Procedure: POLYPECTOMY;  Surgeon: Rogene Houston, MD;  [facility];  Service: Endoscopy;;   REPLACEMENT TOTAL KNEE Right    RHINOPLASTY     STONE EXTRACTION WITH BASKET Right 08/08/2020   Procedure: STONE EXTRACTION WITH BASKET;  Surgeon: Cleon Gustin, MD;  Location: [facility];  Service: Urology;  Laterality: Right;   TOTAL KNEE ARTHROPLASTY Left 03/10/2022   Procedure: TOTAL KNEE ARTHROPLASTY;  Surgeon: Carole Civil, MD;  Location: [facility];  Service: Orthopedics;  Laterality: Left;   Patient Active Problem List   Diagnosis Date Noted   Primary osteoarthritis of left knee 03/10/2022   Osteoarthritis of left knee 03/10/2022   Insomnia 02/18/2022   Nightmares associated with chronic post-traumatic stress disorder 02/18/2022   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   Chronic pain syndrome 02/18/2022   Chronic post-traumatic stress disorder 02/18/2022   Difficulty controlling anger 02/18/2022   Seizures ([place]) 03/07/2021   Thrombocytopenia ([place]) 03/07/2021   Status post total knee replacement 09/29/2013  Low serum testosterone level 03/11/2012    PCP: Garey Ham NP   REFERRING PROVIDER: Carole Civil, MD  REFERRING DIAG: [phone] (ICD-10-CM) - Unilateral primary osteoarthritis, left knee  THERAPY DIAG:  Left knee pain, unspecified chronicity  Stiffness of left knee, not elsewhere classified  Rationale for Evaluation and Treatment: Rehabilitation  ONSET DATE: 03/10/22  SUBJECTIVE:   SUBJECTIVE STATEMENT: Pt states he has no pain other than at the very top of his incision and lateral knee.  States he's been using the ice and heat trying to help reduce the stiffness.  Reports compliance with HEP.  Evaluation:  Patient presents to therapy with  complaint of LT knee pain s/p LT TKA on 03/10/22. Patient is doing well overall. He is concerned about his incision, he feels it is "coming apart". Pain is moderate. He is no longer taking pain medication. He is walking with no AD. He did have home health for a few weeks  PERTINENT HISTORY: LT TKA 03/10/22, RT TKA 2015   PAIN:  Are you having pain? Yes: NPRS scale: 5/10 Pain location: Lt knee  Pain description: sore, aching, swollen  Aggravating factors: standing, walking, bending, WB Relieving factors: rest, non WB  PRECAUTIONS: None  WEIGHT BEARING RESTRICTIONS: No  FALLS:  Has patient fallen in last 6 months? No  LIVING ENVIRONMENT: Lives with: lives with their spouse Lives in: House/apartment Stairs: Yes: External: 1 steps; none Has following equipment at home: Single point cane and Walker - 2 wheeled  OCCUPATION: works for [employer]   PLOF: Independent  PATIENT GOALS: Have LT knee turn out as good as RT knee  NEXT MD VISIT:   OBJECTIVE:   DIAGNOSTIC FINDINGS: NA  PATIENT SURVEYS:  FOTO Complete next session   COGNITION: Overall cognitive status: Within functional limits for tasks assessed     SENSATION: WFL  EDEMA:  Mild/ moderate   PALPATION: Min TTP about incision   LOWER EXTREMITY ROM:  Active ROM Right eval Left eval Left 03/31/22  Hip flexion     Hip extension     Hip abduction     Hip adduction     Hip internal rotation     Hip external rotation     Knee flexion 125 90 105  Knee extension 0 -2 -2  Ankle dorsiflexion     Ankle plantarflexion     Ankle inversion     Ankle eversion      (Blank rows = not tested)  LOWER EXTREMITY MMT:  MMT Right eval Left eval  Hip flexion 5 4+  Hip extension 4+ 4  Hip abduction 4+ 4  Hip adduction    Hip internal rotation    Hip external rotation    Knee flexion 5 4+   Knee extension 5 4  Ankle dorsiflexion 5 5  Ankle plantarflexion    Ankle inversion    Ankle eversion     (Blank rows = not  tested)   FUNCTIONAL TESTS:  5 times sit to stand: 10.26 sec no UE 2 minute walk test: 450 feet with no AD   GAIT:  Decreased LT knee flexion, decreased stride   TODAY'S TREATMENT:  DATE:  03/31/22 Goal review Bike set 15 rocking 5 minutes Standing:  knee flexion stretch with 12" step 10X10"  Hamstring stretch 12" 5X20" with overpressure from UE  Knee flexion 10X  Heelraises 10X  Toeraises 10X Supine:  manual, STM and scar massage to proximal end, edema massage AROM 2-105 following  03/26/22 Eval    PATIENT EDUCATION:  Education details: on Eval findings, POC and HEP Person educated: Patient Education method: Explanation Education comprehension: verbalized understanding  HOME EXERCISE PROGRAM: Access Code: [email or web address] URL: [email or web address] Date: 03/26/2022 Prepared by: Josue Hector  Exercises - Supine Bridge  - 3 x daily - 7 x weekly - 2 sets - 10 reps - Supine Active Straight Leg Raise  - 3 x daily - 7 x weekly - 2 sets - 10 reps - Supine Knee Extension Mobilization with Weight  - 3 x daily - 7 x weekly - 1 sets - 1 reps - 3-5 minutes hold  ASSESSMENT:  CLINICAL IMPRESSION: Began session with bike to work on ROM.  Pt unable to make full revolutions at this point.  Progressed with standing stretches for both flexion and extension. Complaints at proximal scar revealed large amount of scar tissue present.  Able to manipulate and reduce adhesions in this area as well as reduced edema following decongestive massage.  AROM increased to 105 degrees following.  Pt reported overall improvement with less tightness and discomfort at end of session.  FOTO was not loaded for this patient so requested front end staff to do this prior to next appt.   Pt will continue to benefit from skilled physical therapy services to address deficits, reduce  pain and improve level of function with ADLs and functional mobility tasks.   OBJECTIVE IMPAIRMENTS: Abnormal gait, decreased activity tolerance, decreased balance, decreased mobility, difficulty walking, decreased ROM, decreased strength, increased edema, increased fascial restrictions, impaired flexibility, improper body mechanics, and pain.   ACTIVITY LIMITATIONS: carrying, lifting, bending, sitting, standing, squatting, sleeping, stairs, transfers, and locomotion level  PARTICIPATION LIMITATIONS: meal prep, cleaning, laundry, driving, shopping, community activity, occupation, and yard work  PERSONAL FACTORS:  NA  are also affecting patient's functional outcome.   REHAB POTENTIAL: Good  CLINICAL DECISION MAKING: Stable/uncomplicated  EVALUATION COMPLEXITY: Low   GOALS: SHORT TERM GOALS: Target date: 04/16/2022  Patient will be independent with initial HEP and self-management strategies to improve functional outcomes Baseline:  Goal status: IN PROGRESS   LONG TERM GOALS: Target date: 05/07/2022  Patient will be independent with advanced HEP and self-management strategies to improve functional outcomes Baseline:  Goal status: IN PROGRESS  2.  Patient will improve FOTO score to predicted value to indicate improvement in functional outcomes Baseline: Complete next session  Goal status: IN PROGRESS  3.  Patient will have LT knee AROM 0-120 degrees to improve functional mobility and facilitate squatting to pick up items from floor. Baseline: -2 to 90 deg Goal status: IN PROGRESS  4. Patient will have equal to or > 4+/5 MMT throughout LLE to improve ability to perform functional mobility, stair ambulation and ADLs.  Baseline: See MMT Goal status: IN PROGRESS  PLAN:  PT FREQUENCY: 3x/week  PT DURATION: 6 weeks  PLANNED INTERVENTIONS: Therapeutic exercises, Therapeutic activity, Neuromuscular re-education, Balance training, Gait training, Patient/Family education, Joint  manipulation, Joint mobilization, Stair training, Aquatic Therapy, Dry Needling, Electrical stimulation, Spinal manipulation, Spinal mobilization, Cryotherapy, Moist heat, scar mobilization, Taping, Traction, Ultrasound, Biofeedback, Ionotophoresis '4mg'$ /ml Dexamethasone, and Manual therapy.   PLAN FOR NEXT SESSION: Complete FOTO as  was not loaded (requested front end staff to do so 1/9). Progress knee strength and ROM. Gait and balance, manual for pain and swelling as needed.   10:41 AM, 03/31/22 Teena Irani, PTA/CLT [place] Ph: [phone]

## 2022-04-02 ENCOUNTER — Ambulatory Visit (HOSPITAL_COMMUNITY): Payer: Federal, State, Local not specified - PPO | Admitting: Physical Therapy

## 2022-04-02 DIAGNOSIS — M25562 Pain in left knee: Secondary | ICD-10-CM

## 2022-04-02 DIAGNOSIS — Z01818 Encounter for other preprocedural examination: Secondary | ICD-10-CM | POA: Diagnosis not present

## 2022-04-02 DIAGNOSIS — M25662 Stiffness of left knee, not elsewhere classified: Secondary | ICD-10-CM

## 2022-04-02 DIAGNOSIS — M1712 Unilateral primary osteoarthritis, left knee: Secondary | ICD-10-CM | POA: Diagnosis not present

## 2022-04-02 DIAGNOSIS — R2689 Other abnormalities of gait and mobility: Secondary | ICD-10-CM

## 2022-04-02 NOTE — Therapy (Signed)
OUTPATIENT PHYSICAL THERAPY TREATMENT   Patient Name: William Farley MRN: [id number] DOB:May 17, 1960, 62 y.o., male Today's Date: 04/02/2022  END OF SESSION:  PT End of Session - 04/02/22 1124     Visit Number 3    Number of Visits 18    Date for PT Re-Evaluation 05/07/22    Authorization Type [employer]    Progress Note Due on Visit 10    PT Start Time 1119    PT Stop Time 1205    PT Time Calculation (min) 46 min    Activity Tolerance Patient tolerated treatment well    Behavior During Therapy WFL for tasks assessed/performed             Past Medical History:  Diagnosis Date   Anxiety    Arthritis    CAD (coronary artery disease)    Minimal Non-obstructive CAD seen on coronary CTA 04/2021   Depression    GERD (gastroesophageal reflux disease)    History of kidney stones    History of thrombocytopenia    Hx of syncope    PAF (paroxysmal atrial fibrillation) (HCC)    ChadsVasc Score is 0.  Monitor revealed less than 1% PAF burden.   Seizure ([place]) 02/2021   Sleep apnea    wears CPAP    Past Surgical History:  Procedure Laterality Date   APPENDECTOMY     BIOPSY  05/01/2021   Procedure: BIOPSY;  Surgeon: Rogene Houston, MD;  [facility];  Service: Endoscopy;;   CERVICAL SPINE SURGERY  2018   CHOLECYSTECTOMY     COLONOSCOPY WITH PROPOFOL N/A 05/01/2021   Procedure: COLONOSCOPY WITH PROPOFOL;  Surgeon: Rogene Houston, MD;  [facility];  Service: Endoscopy;  Laterality: N/A;  1050   CYST EXCISION     CYSTOSCOPY     CYSTOSCOPY WITH RETROGRADE PYELOGRAM, URETEROSCOPY AND STENT PLACEMENT Right 08/08/2020   Procedure: CYSTOSCOPY WITH RIGHT RETROGRADE PYELOGRAM AND RIGHT URETEROSCOPY;  Surgeon: Cleon Gustin, MD;  Location: [facility];  Service: Urology;  Laterality: Right;   EXTRACORPOREAL SHOCK WAVE LITHOTRIPSY Right 05/21/2020   Procedure: EXTRACORPOREAL SHOCK WAVE LITHOTRIPSY (ESWL);  Surgeon: Cleon Gustin, MD;  Location: [facility];   Service: Urology;  Laterality: Right;   GASTRIC BYPASS OPEN  2003   INCISIONAL HERNIA REPAIR     kidney stone removal     KNEE ARTHROSCOPY Left    multiple   NECK SURGERY     POLYPECTOMY  05/01/2021   Procedure: POLYPECTOMY;  Surgeon: Rogene Houston, MD;  [facility];  Service: Endoscopy;;   REPLACEMENT TOTAL KNEE Right    RHINOPLASTY     STONE EXTRACTION WITH BASKET Right 08/08/2020   Procedure: STONE EXTRACTION WITH BASKET;  Surgeon: Cleon Gustin, MD;  Location: [facility];  Service: Urology;  Laterality: Right;   TOTAL KNEE ARTHROPLASTY Left 03/10/2022   Procedure: TOTAL KNEE ARTHROPLASTY;  Surgeon: Carole Civil, MD;  Location: [facility];  Service: Orthopedics;  Laterality: Left;   Patient Active Problem List   Diagnosis Date Noted   Primary osteoarthritis of left knee 03/10/2022   Osteoarthritis of left knee 03/10/2022   Insomnia 02/18/2022   Nightmares associated with chronic post-traumatic stress disorder 02/18/2022   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[place]) 02/18/2022   Chronic pain syndrome 02/18/2022   Chronic post-traumatic stress disorder 02/18/2022   Difficulty controlling anger 02/18/2022   Seizures ([place]) 03/07/2021   Thrombocytopenia ([place]) 03/07/2021   Status post total knee replacement 09/29/2013  Low serum testosterone level 03/11/2012    PCP: Garey Ham NP   REFERRING PROVIDER: Carole Civil, MD  REFERRING DIAG: [phone] (ICD-10-CM) - Unilateral primary osteoarthritis, left knee  THERAPY DIAG:  Left knee pain, unspecified chronicity  Stiffness of left knee, not elsewhere classified  Other abnormalities of gait and mobility  Rationale for Evaluation and Treatment: Rehabilitation  ONSET DATE: 03/10/22  SUBJECTIVE:   SUBJECTIVE STATEMENT: Pt states he continues to work on reducing the stiffness and tightness but feels it's still not leaving. Continues with ice and heat to help as well as self massage and stretching.  Evaluation:  Patient  presents to therapy with complaint of LT knee pain s/p LT TKA on 03/10/22. Patient is doing well overall. He is concerned about his incision, he feels it is "coming apart". Pain is moderate. He is no longer taking pain medication. He is walking with no AD. He did have home health for a few weeks  PERTINENT HISTORY: LT TKA 03/10/22, RT TKA 2015   PAIN:  Are you having pain? Yes: NPRS scale: 5/10 Pain location: Lt knee  Pain description: sore, aching, swollen  Aggravating factors: standing, walking, bending, WB Relieving factors: rest, non WB  PRECAUTIONS: None  WEIGHT BEARING RESTRICTIONS: No  FALLS:  Has patient fallen in last 6 months? No  LIVING ENVIRONMENT: Lives with: lives with their spouse Lives in: House/apartment Stairs: Yes: External: 1 steps; none Has following equipment at home: Single point cane and Walker - 2 wheeled  OCCUPATION: works for [employer]   PLOF: Independent  PATIENT GOALS: Have LT knee turn out as good as RT knee  NEXT MD VISIT:   OBJECTIVE:   DIAGNOSTIC FINDINGS: NA  PATIENT SURVEYS:  FOTO 49.35%  COGNITION: Overall cognitive status: Within functional limits for tasks assessed     SENSATION: WFL  EDEMA:  Mild/ moderate   PALPATION: Min TTP about incision   LOWER EXTREMITY ROM:  Active ROM Right eval Left eval Left 03/31/22 Left 04/02/22  Hip flexion      Hip extension      Hip abduction      Hip adduction      Hip internal rotation      Hip external rotation      Knee flexion 125 90 105 114  Knee extension 0 -2 -2 -2  Ankle dorsiflexion      Ankle plantarflexion      Ankle inversion      Ankle eversion       (Blank rows = not tested)  LOWER EXTREMITY MMT:  MMT Right eval Left eval  Hip flexion 5 4+  Hip extension 4+ 4  Hip abduction 4+ 4  Hip adduction    Hip internal rotation    Hip external rotation    Knee flexion 5 4+   Knee extension 5 4  Ankle dorsiflexion 5 5  Ankle plantarflexion    Ankle inversion     Ankle eversion     (Blank rows = not tested)   FUNCTIONAL TESTS:  5 times sit to stand: 10.26 sec no UE 2 minute walk test: 450 feet with no AD   GAIT:  Decreased LT knee flexion, decreased stride   TODAY'S TREATMENT:  DATE:  04/02/22 Bike seat 14 full revolutions 5 minutes Standing:  knee flexion stretch with 12" step 10X10"  Hamstring stretch 12" 3X20" with overpressure from UE  Knee flexion 20X  Heelraises 20X  Toeraises 20X Supine:  quad sets 10X SAQ 10X5" Heelslides 10X manual, STM and scar massage to proximal end, edema massage AROM 2-114 following  03/31/22 Goal review Bike set 15 rocking 5 minutes Standing:  knee flexion stretch with 12" step 10X10"  Hamstring stretch 12" 5X20" with overpressure from UE  Knee flexion 10X  Heelraises 10X  Toeraises 10X Supine:  manual, STM and scar massage to proximal end, edema massage AROM 2-105 following  03/26/22 Eval    PATIENT EDUCATION:  Education details: on Eval findings, POC and HEP Person educated: Patient Education method: Explanation Education comprehension: verbalized understanding  HOME EXERCISE PROGRAM: Access Code: [email or web address] URL: [email or web address] Date: 03/26/2022 Prepared by: Josue Hector  Exercises - Supine Bridge  - 3 x daily - 7 x weekly - 2 sets - 10 reps - Supine Active Straight Leg Raise  - 3 x daily - 7 x weekly - 2 sets - 10 reps - Supine Knee Extension Mobilization with Weight  - 3 x daily - 7 x weekly - 1 sets - 1 reps - 3-5 minutes hold  ASSESSMENT:  CLINICAL IMPRESSION: Began session with bike to work on ROM.  Pt able to make full revolutions backward initially then forward. Continued with standing stretches for both flexion and extension. Discussed his application of heat and ice and recommended 20 minute max periods as he was keeping these on  continuously. Continued soft tissue work to manipulate and reduce adhesions in the knee and improve ROM, reduce edema.  AROM increased to 114 degrees following.  Pt reported overall improvement with less tightness and discomfort at end of session.  FOTO completed this session with score of 49.35%.   Pt will continue to benefit from skilled physical therapy services to address deficits, reduce pain and improve level of function with ADLs and functional mobility tasks.   OBJECTIVE IMPAIRMENTS: Abnormal gait, decreased activity tolerance, decreased balance, decreased mobility, difficulty walking, decreased ROM, decreased strength, increased edema, increased fascial restrictions, impaired flexibility, improper body mechanics, and pain.   ACTIVITY LIMITATIONS: carrying, lifting, bending, sitting, standing, squatting, sleeping, stairs, transfers, and locomotion level  PARTICIPATION LIMITATIONS: meal prep, cleaning, laundry, driving, shopping, community activity, occupation, and yard work  PERSONAL FACTORS:  NA  are also affecting patient's functional outcome.   REHAB POTENTIAL: Good  CLINICAL DECISION MAKING: Stable/uncomplicated  EVALUATION COMPLEXITY: Low   GOALS: SHORT TERM GOALS: Target date: 04/16/2022  Patient will be independent with initial HEP and self-management strategies to improve functional outcomes Baseline:  Goal status: IN PROGRESS   LONG TERM GOALS: Target date: 05/07/2022  Patient will be independent with advanced HEP and self-management strategies to improve functional outcomes Baseline:  Goal status: IN PROGRESS  2.  Patient will improve FOTO score to predicted value to indicate improvement in functional outcomes Baseline: Complete next session  Goal status: IN PROGRESS  3.  Patient will have LT knee AROM 0-120 degrees to improve functional mobility and facilitate squatting to pick up items from floor. Baseline: -2 to 90 deg Goal status: IN PROGRESS  4. Patient will  have equal to or > 4+/5 MMT throughout LLE to improve ability to perform functional mobility, stair ambulation and ADLs.  Baseline: See MMT Goal status: IN PROGRESS  PLAN:  PT FREQUENCY: 3x/week  PT DURATION: 6  weeks  PLANNED INTERVENTIONS: Therapeutic exercises, Therapeutic activity, Neuromuscular re-education, Balance training, Gait training, Patient/Family education, Joint manipulation, Joint mobilization, Stair training, Aquatic Therapy, Dry Needling, Electrical stimulation, Spinal manipulation, Spinal mobilization, Cryotherapy, Moist heat, scar mobilization, Taping, Traction, Ultrasound, Biofeedback, Ionotophoresis '4mg'$ /ml Dexamethasone, and Manual therapy.   PLAN FOR NEXT SESSION:  Progress knee strength and ROM. Gait and balance, manual for pain and swelling as needed.   2:06 PM, 04/02/22 Teena Irani, PTA/CLT [place] Ph: [phone]

## 2022-04-07 ENCOUNTER — Encounter (HOSPITAL_COMMUNITY): Payer: Federal, State, Local not specified - PPO

## 2022-04-09 ENCOUNTER — Ambulatory Visit (HOSPITAL_COMMUNITY): Payer: Federal, State, Local not specified - PPO | Admitting: Physical Therapy

## 2022-04-09 DIAGNOSIS — R2689 Other abnormalities of gait and mobility: Secondary | ICD-10-CM

## 2022-04-09 DIAGNOSIS — M1712 Unilateral primary osteoarthritis, left knee: Secondary | ICD-10-CM | POA: Diagnosis not present

## 2022-04-09 DIAGNOSIS — M25662 Stiffness of left knee, not elsewhere classified: Secondary | ICD-10-CM

## 2022-04-09 DIAGNOSIS — Z01818 Encounter for other preprocedural examination: Secondary | ICD-10-CM | POA: Diagnosis not present

## 2022-04-09 DIAGNOSIS — M25562 Pain in left knee: Secondary | ICD-10-CM | POA: Diagnosis not present

## 2022-04-09 NOTE — Therapy (Signed)
OUTPATIENT PHYSICAL THERAPY TREATMENT   Patient Name: William Farley MRN: [id number] DOB:10/11/1960, 62 y.o., male Today's Date: 04/09/2022  END OF SESSION:  PT End of Session - 04/09/22 1112     Visit Number 4    Number of Visits 18    Date for PT Re-Evaluation 05/07/22    Authorization Type [employer] Federal    Progress Note Due on Visit 10    PT Start Time 1116    PT Stop Time 1155    PT Time Calculation (min) 39 min    Activity Tolerance Patient tolerated treatment well    Behavior During Therapy WFL for tasks assessed/performed             Past Medical History:  Diagnosis Date   Anxiety    Arthritis    CAD (coronary artery disease)    Minimal Non-obstructive CAD seen on coronary CTA 04/2021   Depression    GERD (gastroesophageal reflux disease)    History of kidney stones    History of thrombocytopenia    Hx of syncope    PAF (paroxysmal atrial fibrillation) (HCC)    ChadsVasc Score is 0.  Monitor revealed less than 1% PAF burden.   Seizure ([place]) 02/2021   Sleep apnea    wears CPAP    Past Surgical History:  Procedure Laterality Date   APPENDECTOMY     BIOPSY  05/01/2021   Procedure: BIOPSY;  Surgeon: Rogene Houston, MD;  [facility];  Service: Endoscopy;;   CERVICAL SPINE SURGERY  2018   CHOLECYSTECTOMY     COLONOSCOPY WITH PROPOFOL N/A 05/01/2021   Procedure: COLONOSCOPY WITH PROPOFOL;  Surgeon: Rogene Houston, MD;  [facility];  Service: Endoscopy;  Laterality: N/A;  1050   CYST EXCISION     CYSTOSCOPY     CYSTOSCOPY WITH RETROGRADE PYELOGRAM, URETEROSCOPY AND STENT PLACEMENT Right 08/08/2020   Procedure: CYSTOSCOPY WITH RIGHT RETROGRADE PYELOGRAM AND RIGHT URETEROSCOPY;  Surgeon: Cleon Gustin, MD;  Location[facility];  Service: Urology;  Laterality: Right;   EXTRACORPOREAL SHOCK WAVE LITHOTRIPSY Right 05/21/2020   Procedure: EXTRACORPOREAL SHOCK WAVE LITHOTRIPSY (ESWL);  Surgeon: Cleon Gustin, MD;  Location: [facility];   Service: Urology;  Laterality: Right;   GASTRIC BYPASS OPEN  2003   INCISIONAL HERNIA REPAIR     kidney stone removal     KNEE ARTHROSCOPY Left    multiple   NECK SURGERY     POLYPECTOMY  05/01/2021   Procedure: POLYPECTOMY;  Surgeon: Rogene Houston, MD;  [facility];  Service: Endoscopy;;   REPLACEMENT TOTAL KNEE Right    RHINOPLASTY     STONE EXTRACTION WITH BASKET Right 08/08/2020   Procedure: STONE EXTRACTION WITH BASKET;  Surgeon: Cleon Gustin, MD;  Location: [facility];  Service: Urology;  Laterality: Right;   TOTAL KNEE ARTHROPLASTY Left 03/10/2022   Procedure: TOTAL KNEE ARTHROPLASTY;  Surgeon: Carole Civil, MD;  Location: [facility];  Service: Orthopedics;  Laterality: Left;   Patient Active Problem List   Diagnosis Date Noted   Primary osteoarthritis of left knee 03/10/2022   Osteoarthritis of left knee 03/10/2022   Insomnia 02/18/2022   Nightmares associated with chronic post-traumatic stress disorder 02/18/2022   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[place]) 02/18/2022   Chronic pain syndrome 02/18/2022   Chronic post-traumatic stress disorder 02/18/2022   Difficulty controlling anger 02/18/2022   Seizures ([place]) 03/07/2021   Thrombocytopenia ([place]) 03/07/2021   Status post total knee replacement 09/29/2013  Low serum testosterone level 03/11/2012    PCP: Garey Ham NP   REFERRING PROVIDER: Carole Civil, MD  REFERRING DIAG: [phone] (ICD-10-CM) - Unilateral primary osteoarthritis, left knee  THERAPY DIAG:  Left knee pain, unspecified chronicity  Stiffness of left knee, not elsewhere classified  Other abnormalities of gait and mobility  Rationale for Evaluation and Treatment: Rehabilitation  ONSET DATE: 03/10/22  SUBJECTIVE:   SUBJECTIVE STATEMENT: Patient says he is doing better. Walking better. Swelling is down. Still dealing with some stiffness and feels knee is not moving as good as it should but has been doing well with using heating pad. Pain  is down, decreasing pain medication as able.  Evaluation:  Patient presents to therapy with complaint of LT knee pain s/p LT TKA on 03/10/22. Patient is doing well overall. He is concerned about his incision, he feels it is "coming apart". Pain is moderate. He is no longer taking pain medication. He is walking with no AD. He did have home health for a few weeks  PERTINENT HISTORY: LT TKA 03/10/22, RT TKA 2015   PAIN:  Are you having pain? Yes: NPRS scale: 2-3/10 Pain location: Lt knee  Pain description: sore, aching, swollen  Aggravating factors: standing, walking, bending, WB Relieving factors: rest, non WB  PRECAUTIONS: None  WEIGHT BEARING RESTRICTIONS: No  FALLS:  Has patient fallen in last 6 months? No  LIVING ENVIRONMENT: Lives with: lives with their spouse Lives in: House/apartment Stairs: Yes: External: 1 steps; none Has following equipment at home: Single point cane and Walker - 2 wheeled  OCCUPATION: works for [employer]   PLOF: Independent  PATIENT GOALS: Have LT knee turn out as good as RT knee  NEXT MD VISIT:   OBJECTIVE:   DIAGNOSTIC FINDINGS: NA  PATIENT SURVEYS:  FOTO 49.35%  COGNITION: Overall cognitive status: Within functional limits for tasks assessed     SENSATION: WFL  EDEMA:  Mild/ moderate   PALPATION: Min TTP about incision   LOWER EXTREMITY ROM:  Active ROM Right eval Left eval Left 03/31/22 Left 04/02/22  Hip flexion      Hip extension      Hip abduction      Hip adduction      Hip internal rotation      Hip external rotation      Knee flexion 125 90 105 114  Knee extension 0 -2 -2 -2  Ankle dorsiflexion      Ankle plantarflexion      Ankle inversion      Ankle eversion       (Blank rows = not tested)  LOWER EXTREMITY MMT:  MMT Right eval Left eval  Hip flexion 5 4+  Hip extension 4+ 4  Hip abduction 4+ 4  Hip adduction    Hip internal rotation    Hip external rotation    Knee flexion 5 4+   Knee extension 5 4   Ankle dorsiflexion 5 5  Ankle plantarflexion    Ankle inversion    Ankle eversion     (Blank rows = not tested)   FUNCTIONAL TESTS:  5 times sit to stand: 10.26 sec no UE 2 minute walk test: 450 feet with no AD   GAIT:  Decreased LT knee flexion, decreased stride   TODAY'S TREATMENT:  DATE:  04/09/22 Rec bike 5 min (seat 11)  Slant board 3 x 30" Heel raise 2 x 10 Toe raise 2 x 10 Knee drive 10 Z61" Step up 7 inch 2  x10 HHA x 1 Side step up 7 inch 2 x 10 HHA x 1 Step down 7 inch 2 x 10 HHA x 2  Mini squat 2 x 10 Tandem stance 2 x 20"  SLS 3  x10"   Clinic amb 1 RT no AD   Manual edema massage LT knee Scar tissue massage    (LT knee AROM 0-117 deg)   04/02/22 Bike seat 14 full revolutions 5 minutes Standing:  knee flexion stretch with 12" step 10X10"  Hamstring stretch 12" 3X20" with overpressure from UE  Knee flexion 20X  Heelraises 20X  Toeraises 20X Supine:  quad sets 10X SAQ 10X5" Heelslides 10X manual, STM and scar massage to proximal end, edema massage AROM 2-114 following  03/31/22 Goal review Bike set 15 rocking 5 minutes Standing:  knee flexion stretch with 12" step 10X10"  Hamstring stretch 12" 5X20" with overpressure from UE  Knee flexion 10X  Heelraises 10X  Toeraises 10X Supine:  manual, STM and scar massage to proximal end, edema massage AROM 2-105 following  03/26/22 Eval    PATIENT EDUCATION:  Education details: on Eval findings, POC and HEP Person educated: Patient Education method: Explanation Education comprehension: verbalized understanding  HOME EXERCISE PROGRAM: Access Code: [email or web address] URL: [email or web address] Date: 03/26/2022 Prepared by: Josue Hector  Exercises - Supine Bridge  - 3 x daily - 7 x weekly - 2 sets - 10 reps - Supine Active Straight Leg Raise  - 3 x daily - 7 x weekly -  2 sets - 10 reps - Supine Knee Extension Mobilization with Weight  - 3 x daily - 7 x weekly - 1 sets - 1 reps - 3-5 minutes hold  ASSESSMENT:  CLINICAL IMPRESSION: Patient demos good progress. Good ROM improvement and able to comfortably complete full revolution on recumbent bike today. Progressed LE strengthening and introduced balance activity. Patient challenged maintaining single leg stance on LLE. Swelling continues to improve. Encouraged ongoing scar tissue massage to address adhesion/ restriction at incisional area. Patient will continue to benefit from skilled therapy services to reduce remaining deficits and improve functional ability.    OBJECTIVE IMPAIRMENTS: Abnormal gait, decreased activity tolerance, decreased balance, decreased mobility, difficulty walking, decreased ROM, decreased strength, increased edema, increased fascial restrictions, impaired flexibility, improper body mechanics, and pain.   ACTIVITY LIMITATIONS: carrying, lifting, bending, sitting, standing, squatting, sleeping, stairs, transfers, and locomotion level  PARTICIPATION LIMITATIONS: meal prep, cleaning, laundry, driving, shopping, community activity, occupation, and yard work  PERSONAL FACTORS:  NA  are also affecting patient's functional outcome.   REHAB POTENTIAL: Good  CLINICAL DECISION MAKING: Stable/uncomplicated  EVALUATION COMPLEXITY: Low   GOALS: SHORT TERM GOALS: Target date: 04/16/2022  Patient will be independent with initial HEP and self-management strategies to improve functional outcomes Baseline:  Goal status: IN PROGRESS   LONG TERM GOALS: Target date: 05/07/2022  Patient will be independent with advanced HEP and self-management strategies to improve functional outcomes Baseline:  Goal status: IN PROGRESS  2.  Patient will improve FOTO score to predicted value to indicate improvement in functional outcomes Baseline: Complete next session  Goal status: IN PROGRESS  3.  Patient  will have LT knee AROM 0-120 degrees to improve functional mobility and facilitate squatting to pick up items from floor. Baseline: -2 to 90 deg  Goal status: IN PROGRESS  4. Patient will have equal to or > 4+/5 MMT throughout LLE to improve ability to perform functional mobility, stair ambulation and ADLs.  Baseline: See MMT Goal status: IN PROGRESS  PLAN:  PT FREQUENCY: 3x/week  PT DURATION: 6 weeks  PLANNED INTERVENTIONS: Therapeutic exercises, Therapeutic activity, Neuromuscular re-education, Balance training, Gait training, Patient/Family education, Joint manipulation, Joint mobilization, Stair training, Aquatic Therapy, Dry Needling, Electrical stimulation, Spinal manipulation, Spinal mobilization, Cryotherapy, Moist heat, scar mobilization, Taping, Traction, Ultrasound, Biofeedback, Ionotophoresis '4mg'$ /ml Dexamethasone, and Manual therapy.   PLAN FOR NEXT SESSION:  Progress knee strength and ROM. Gait and balance, manual for pain and swelling as needed.   1:39 PM, 04/09/22 Josue Hector PT DPT  Physical Therapist with [facility]  [phone]

## 2022-04-14 ENCOUNTER — Ambulatory Visit (HOSPITAL_COMMUNITY): Payer: Federal, State, Local not specified - PPO | Admitting: Physical Therapy

## 2022-04-14 DIAGNOSIS — R2689 Other abnormalities of gait and mobility: Secondary | ICD-10-CM

## 2022-04-14 DIAGNOSIS — M25662 Stiffness of left knee, not elsewhere classified: Secondary | ICD-10-CM

## 2022-04-14 DIAGNOSIS — M1712 Unilateral primary osteoarthritis, left knee: Secondary | ICD-10-CM | POA: Diagnosis not present

## 2022-04-14 DIAGNOSIS — M25562 Pain in left knee: Secondary | ICD-10-CM | POA: Diagnosis not present

## 2022-04-14 DIAGNOSIS — Z01818 Encounter for other preprocedural examination: Secondary | ICD-10-CM | POA: Diagnosis not present

## 2022-04-14 DIAGNOSIS — K219 Gastro-esophageal reflux disease without esophagitis: Secondary | ICD-10-CM | POA: Insufficient documentation

## 2022-04-14 DIAGNOSIS — R55 Syncope and collapse: Secondary | ICD-10-CM | POA: Insufficient documentation

## 2022-04-14 NOTE — Therapy (Signed)
OUTPATIENT PHYSICAL THERAPY TREATMENT   Patient Name: William Farley MRN: [id number] DOB:30-Aug-1960, 62 y.o., male Today's Date: 04/09/2022  END OF SESSION:  PT End of Session - 04/09/22 1112     Visit Number 4    Number of Visits 18    Date for PT Re-Evaluation 05/07/22    Authorization Type [employer]    Progress Note Due on Visit 10    PT Start Time 1116    PT Stop Time 1155    PT Time Calculation (min) 39 min    Activity Tolerance Patient tolerated treatment well    Behavior During Therapy WFL for tasks assessed/performed             Past Medical History:  Diagnosis Date   Anxiety    Arthritis    CAD (coronary artery disease)    Minimal Non-obstructive CAD seen on coronary CTA 04/2021   Depression    GERD (gastroesophageal reflux disease)    History of kidney stones    History of thrombocytopenia    Hx of syncope    PAF (paroxysmal atrial fibrillation) (HCC)    ChadsVasc Score is 0.  Monitor revealed less than 1% PAF burden.   Seizure ([place]) 02/2021   Sleep apnea    wears CPAP    Past Surgical History:  Procedure Laterality Date   APPENDECTOMY     BIOPSY  05/01/2021   Procedure: BIOPSY;  Surgeon: Rogene Houston, MD;  [facility];  Service: Endoscopy;;   CERVICAL SPINE SURGERY  2018   CHOLECYSTECTOMY     COLONOSCOPY WITH PROPOFOL N/A 05/01/2021   Procedure: COLONOSCOPY WITH PROPOFOL;  Surgeon: Rogene Houston, MD;  [facility];  Service: Endoscopy;  Laterality: N/A;  1050   CYST EXCISION     CYSTOSCOPY     CYSTOSCOPY WITH RETROGRADE PYELOGRAM, URETEROSCOPY AND STENT PLACEMENT Right 08/08/2020   Procedure: CYSTOSCOPY WITH RIGHT RETROGRADE PYELOGRAM AND RIGHT URETEROSCOPY;  Surgeon: Cleon Gustin, MD;  Location: [facility];  Service: Urology;  Laterality: Right;   EXTRACORPOREAL SHOCK WAVE LITHOTRIPSY Right 05/21/2020   Procedure: EXTRACORPOREAL SHOCK WAVE LITHOTRIPSY (ESWL);  Surgeon: Cleon Gustin, MD;  Location: [facility];   Service: Urology;  Laterality: Right;   GASTRIC BYPASS OPEN  2003   INCISIONAL HERNIA REPAIR     kidney stone removal     KNEE ARTHROSCOPY Left    multiple   NECK SURGERY     POLYPECTOMY  05/01/2021   Procedure: POLYPECTOMY;  Surgeon: Rogene Houston, MD;  [facility];  Service: Endoscopy;;   REPLACEMENT TOTAL KNEE Right    RHINOPLASTY     STONE EXTRACTION WITH BASKET Right 08/08/2020   Procedure: STONE EXTRACTION WITH BASKET;  Surgeon: Cleon Gustin, MD;  Location: [facility];  Service: Urology;  Laterality: Right;   TOTAL KNEE ARTHROPLASTY Left 03/10/2022   Procedure: TOTAL KNEE ARTHROPLASTY;  Surgeon: Carole Civil, MD;  Location: [facility];  Service: Orthopedics;  Laterality: Left;   Patient Active Problem List   Diagnosis Date Noted   Primary osteoarthritis of left knee 03/10/2022   Osteoarthritis of left knee 03/10/2022   Insomnia 02/18/2022   Nightmares associated with chronic post-traumatic stress disorder 02/18/2022   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[place]) 02/18/2022   Chronic pain syndrome 02/18/2022   Chronic post-traumatic stress disorder 02/18/2022   Difficulty controlling anger 02/18/2022   Seizures ([place]) 03/07/2021   Thrombocytopenia ([place]) 03/07/2021   Status post total knee replacement 09/29/2013  Low serum testosterone level 03/11/2012    PCP: Garey Ham NP   REFERRING PROVIDER: Carole Civil, MD  REFERRING DIAG: [phone] (ICD-10-CM) - Unilateral primary osteoarthritis, left knee  THERAPY DIAG:  Left knee pain, unspecified chronicity  Stiffness of left knee, not elsewhere classified  Other abnormalities of gait and mobility  Rationale for Evaluation and Treatment: Rehabilitation  ONSET DATE: 03/10/22  SUBJECTIVE:   SUBJECTIVE STATEMENT: Patient reports no pain just a little irritation in his hamstring area.  States he's able to step up into his truck bed with Lt LE leading without issue or pain.  Evaluation:  Patient presents to  therapy with complaint of LT knee pain s/p LT TKA on 03/10/22. Patient is doing well overall. He is concerned about his incision, he feels it is "coming apart". Pain is moderate. He is no longer taking pain medication. He is walking with no AD. He did have home health for a few weeks  PERTINENT HISTORY: LT TKA 03/10/22, RT TKA 2015   PAIN:  Are you having pain? No  PRECAUTIONS: None  WEIGHT BEARING RESTRICTIONS: No  FALLS:  Has patient fallen in last 6 months? No  LIVING ENVIRONMENT: Lives with: lives with their spouse Lives in: House/apartment Stairs: Yes: External: 1 steps; none Has following equipment at home: Single point cane and Walker - 2 wheeled  OCCUPATION: works for [employer]   PLOF: Independent  PATIENT GOALS: Have LT knee turn out as good as RT knee  NEXT MD VISIT:   OBJECTIVE:   DIAGNOSTIC FINDINGS: NA  PATIENT SURVEYS:  FOTO 49.35%  COGNITION: Overall cognitive status: Within functional limits for tasks assessed     SENSATION: WFL  EDEMA:  Mild/ moderate   PALPATION: Min TTP about incision   LOWER EXTREMITY ROM:  Active ROM Right eval Left eval Left 03/31/22 Left 04/02/22 Left 04/14/22  Hip flexion       Hip extension       Hip abduction       Hip adduction       Hip internal rotation       Hip external rotation       Knee flexion 125 90 105 114 125  Knee extension 0 -2 -2 -2 0  Ankle dorsiflexion       Ankle plantarflexion       Ankle inversion       Ankle eversion        (Blank rows = not tested)  LOWER EXTREMITY MMT:  MMT Right eval Left eval  Hip flexion 5 4+  Hip extension 4+ 4  Hip abduction 4+ 4  Hip adduction    Hip internal rotation    Hip external rotation    Knee flexion 5 4+   Knee extension 5 4  Ankle dorsiflexion 5 5  Ankle plantarflexion    Ankle inversion    Ankle eversion     (Blank rows = not tested)   FUNCTIONAL TESTS:  5 times sit to stand: 10.26 sec no UE 2 minute walk test: 450 feet with no AD    GAIT:  Decreased LT knee flexion, decreased stride   TODAY'S TREATMENT:  DATE:  04/14/22 Rec bike 5 min (seat 11) Level 3  Standing:  Slant board 3 x 30" Heel raise on incline 20X Toe raise on incline 20X Knee drive onto 12" step 10 x10" Step up 8 inch 2  x10 HHA x 1 Side step up 8 inch 2 x 10 HHA x 1 Step down 8 inch 2 x 10 HHA x 2  7" stair negotiation reciprocally 5RT SLS 3 x 30" on foam  Vectors on Lt LE and foam with minimal UE assist 10X5" each Leg press 5PL 3X10 AROM Lt knee supine 0-125 degrees  04/09/22 Rec bike 5 min (seat 11)  Slant board 3 x 30" Heel raise 2 x 10 Toe raise 2 x 10 Knee drive 10 D14" Step up 7 inch 2  x10 HHA x 1 Side step up 7 inch 2 x 10 HHA x 1 Step down 7 inch 2 x 10 HHA x 2  Mini squat 2 x 10 Tandem stance 2 x 20"  SLS 3  x10"   Clinic amb 1 RT no AD   Manual edema massage LT knee Scar tissue massage    (LT knee AROM 0-117 deg)   04/02/22 Bike seat 14 full revolutions 5 minutes Standing:  knee flexion stretch with 12" step 10X10"  Hamstring stretch 12" 3X20" with overpressure from UE  Knee flexion 20X  Heelraises 20X  Toeraises 20X Supine:  quad sets 10X SAQ 10X5" Heelslides 10X manual, STM and scar massage to proximal end, edema massage AROM 2-114 following  03/31/22 Goal review Bike set 15 rocking 5 minutes Standing:  knee flexion stretch with 12" step 10X10"  Hamstring stretch 12" 5X20" with overpressure from UE  Knee flexion 10X  Heelraises 10X  Toeraises 10X Supine:  manual, STM and scar massage to proximal end, edema massage AROM 2-105 following  03/26/22 Eval    PATIENT EDUCATION:  Education details: on Eval findings, POC and HEP Person educated: Patient Education method: Explanation Education comprehension: verbalized understanding  HOME EXERCISE PROGRAM: Access Code: [email or web address] URL:  [email or web address] Date: 03/26/2022 Prepared by: Josue Hector  Exercises - Supine Bridge  - 3 x daily - 7 x weekly - 2 sets - 10 reps - Supine Active Straight Leg Raise  - 3 x daily - 7 x weekly - 2 sets - 10 reps - Supine Knee Extension Mobilization with Weight  - 3 x daily - 7 x weekly - 1 sets - 1 reps - 3-5 minutes hold  ASSESSMENT:  CLINICAL IMPRESSION: Focus this session on functional strengthening as ROM is now WNL and equal to Rt knee.  Warm up on bike with full revolutions completed. Progressed step with increase to 8" and balance challenges with foam surface. Pt able to negotiate stairs reciprocally without issue or gait deviations.  Began leg press for improving strength.  Encouraged ongoing scar tissue massage to address adhesion/ restriction at incisional area. Patient will continue to benefit from skilled therapy services to reduce remaining deficits and improve functional ability.    OBJECTIVE IMPAIRMENTS: Abnormal gait, decreased activity tolerance, decreased balance, decreased mobility, difficulty walking, decreased ROM, decreased strength, increased edema, increased fascial restrictions, impaired flexibility, improper body mechanics, and pain.   ACTIVITY LIMITATIONS: carrying, lifting, bending, sitting, standing, squatting, sleeping, stairs, transfers, and locomotion level  PARTICIPATION LIMITATIONS: meal prep, cleaning, laundry, driving, shopping, community activity, occupation, and yard work  PERSONAL FACTORS:  NA  are also affecting patient's functional outcome.   REHAB POTENTIAL: Good  CLINICAL DECISION MAKING:  Stable/uncomplicated  EVALUATION COMPLEXITY: Low   GOALS: SHORT TERM GOALS: Target date: 04/16/2022  Patient will be independent with initial HEP and self-management strategies to improve functional outcomes Baseline:  Goal status: IN PROGRESS   LONG TERM GOALS: Target date: 05/07/2022  Patient will be independent with advanced HEP  and self-management strategies to improve functional outcomes Baseline:  Goal status: IN PROGRESS  2.  Patient will improve FOTO score to predicted value to indicate improvement in functional outcomes Baseline: Complete next session  Goal status: IN PROGRESS  3.  Patient will have LT knee AROM 0-120 degrees to improve functional mobility and facilitate squatting to pick up items from floor. Baseline: -2 to 90 deg Goal status: IN PROGRESS  4. Patient will have equal to or > 4+/5 MMT throughout LLE to improve ability to perform functional mobility, stair ambulation and ADLs.  Baseline: See MMT Goal status: IN PROGRESS  PLAN:  PT FREQUENCY: 3x/week  PT DURATION: 6 weeks  PLANNED INTERVENTIONS: Therapeutic exercises, Therapeutic activity, Neuromuscular re-education, Balance training, Gait training, Patient/Family education, Joint manipulation, Joint mobilization, Stair training, Aquatic Therapy, Dry Needling, Electrical stimulation, Spinal manipulation, Spinal mobilization, Cryotherapy, Moist heat, scar mobilization, Taping, Traction, Ultrasound, Biofeedback, Ionotophoresis '4mg'$ /ml Dexamethasone, and Manual therapy.   PLAN FOR NEXT SESSION:  Progress knee strength and ROM. Gait and balance, manual for pain and swelling as needed.   1:39 PM, 04/09/22 Teena Irani, PTA/CLT [place] Ph: [phone]

## 2022-04-15 ENCOUNTER — Ambulatory Visit (INDEPENDENT_AMBULATORY_CARE_PROVIDER_SITE_OTHER): Payer: Federal, State, Local not specified - PPO | Admitting: Orthopedic Surgery

## 2022-04-15 DIAGNOSIS — Z96652 Presence of left artificial knee joint: Secondary | ICD-10-CM

## 2022-04-15 NOTE — Progress Notes (Signed)
Chief Complaint  Patient presents with   Post-op Follow-up    Left knee replacement flexion to 125 in therapy walking well, states improving 03/10/22 total knee replacement    5 weeks postop left total knee.  Patient is doing well  Complains of some tightness on the lateral side of the knee but otherwise he is regained his motion he is walking without any support he has full extension  I plan to see him in mid February and release him back to work around that time

## 2022-04-16 ENCOUNTER — Encounter (HOSPITAL_COMMUNITY): Payer: Self-pay | Admitting: Physical Therapy

## 2022-04-16 ENCOUNTER — Ambulatory Visit (HOSPITAL_COMMUNITY): Payer: Federal, State, Local not specified - PPO | Admitting: Physical Therapy

## 2022-04-16 DIAGNOSIS — R2689 Other abnormalities of gait and mobility: Secondary | ICD-10-CM | POA: Diagnosis not present

## 2022-04-16 DIAGNOSIS — M25662 Stiffness of left knee, not elsewhere classified: Secondary | ICD-10-CM

## 2022-04-16 DIAGNOSIS — Z01818 Encounter for other preprocedural examination: Secondary | ICD-10-CM | POA: Diagnosis not present

## 2022-04-16 DIAGNOSIS — M1712 Unilateral primary osteoarthritis, left knee: Secondary | ICD-10-CM | POA: Diagnosis not present

## 2022-04-16 DIAGNOSIS — M25562 Pain in left knee: Secondary | ICD-10-CM | POA: Diagnosis not present

## 2022-04-16 NOTE — Therapy (Signed)
OUTPATIENT PHYSICAL THERAPY TREATMENT   Patient Name: William Farley MRN: [id number] DOB:1960-09-06, 62 y.o., male Today's Date: 04/16/2022  END OF SESSION:  PT End of Session - 04/16/22 1036     Visit Number 6    Number of Visits 18    Date for PT Re-Evaluation 05/07/22    Authorization Type [employer] Federal    Progress Note Due on Visit 10    PT Start Time 1033    PT Stop Time 1113    PT Time Calculation (min) 40 min    Activity Tolerance Patient tolerated treatment well    Behavior During Therapy WFL for tasks assessed/performed             Past Medical History:  Diagnosis Date   Anxiety    Arthritis    CAD (coronary artery disease)    Minimal Non-obstructive CAD seen on coronary CTA 04/2021   Depression    GERD (gastroesophageal reflux disease)    History of kidney stones    History of thrombocytopenia    Hx of syncope    PAF (paroxysmal atrial fibrillation) (HCC)    ChadsVasc Score is 0.  Monitor revealed less than 1% PAF burden.   Seizure ([place]) 02/2021   Sleep apnea    wears CPAP    Past Surgical History:  Procedure Laterality Date   APPENDECTOMY     BIOPSY  05/01/2021   Procedure: BIOPSY;  Surgeon: Rogene Houston, MD;  [facility];  Service: Endoscopy;;   CERVICAL SPINE SURGERY  2018   CHOLECYSTECTOMY     COLONOSCOPY WITH PROPOFOL N/A 05/01/2021   Procedure: COLONOSCOPY WITH PROPOFOL;  Surgeon: Rogene Houston, MD;  [facility];  Service: Endoscopy;  Laterality: N/A;  1050   CYST EXCISION     CYSTOSCOPY     CYSTOSCOPY WITH RETROGRADE PYELOGRAM, URETEROSCOPY AND STENT PLACEMENT Right 08/08/2020   Procedure: CYSTOSCOPY WITH RIGHT RETROGRADE PYELOGRAM AND RIGHT URETEROSCOPY;  Surgeon: Cleon Gustin, MD;  Location: [facility];  Service: Urology;  Laterality: Right;   EXTRACORPOREAL SHOCK WAVE LITHOTRIPSY Right 05/21/2020   Procedure: EXTRACORPOREAL SHOCK WAVE LITHOTRIPSY (ESWL);  Surgeon: Cleon Gustin, MD;  Location: [facility];   Service: Urology;  Laterality: Right;   GASTRIC BYPASS OPEN  2003   INCISIONAL HERNIA REPAIR     kidney stone removal     KNEE ARTHROSCOPY Left    multiple   NECK SURGERY     POLYPECTOMY  05/01/2021   Procedure: POLYPECTOMY;  Surgeon: Rogene Houston, MD;  [facility];  Service: Endoscopy;;   REPLACEMENT TOTAL KNEE Right    RHINOPLASTY     STONE EXTRACTION WITH BASKET Right 08/08/2020   Procedure: STONE EXTRACTION WITH BASKET;  Surgeon: Cleon Gustin, MD;  Location: [facility];  Service: Urology;  Laterality: Right;   TOTAL KNEE ARTHROPLASTY Left 03/10/2022   Procedure: TOTAL KNEE ARTHROPLASTY;  Surgeon: Carole Civil, MD;  Location: [facility];  Service: Orthopedics;  Laterality: Left;   Patient Active Problem List   Diagnosis Date Noted   Gastro-esophageal reflux disease without esophagitis 04/14/2022   Syncope and collapse 04/14/2022   Primary osteoarthritis of left knee 03/10/2022   Osteoarthritis of left knee 03/10/2022   Insomnia 02/18/2022   Nightmares associated with chronic post-traumatic stress disorder 02/18/2022   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   Chronic pain syndrome 02/18/2022   Chronic post-traumatic stress disorder 02/18/2022   Difficulty controlling anger 02/18/2022   Seizures ([place])  03/07/2021   Thrombocytopenia ([place]) 03/07/2021   Status post total knee replacement 09/29/2013   Low serum testosterone level 03/11/2012    PCP: Garey Ham NP   REFERRING PROVIDER: Carole Civil, MD  REFERRING DIAG: [phone] (ICD-10-CM) - Unilateral primary osteoarthritis, left knee  THERAPY DIAG:  Stiffness of left knee, not elsewhere classified  Rationale for Evaluation and Treatment: Rehabilitation  ONSET DATE: 03/10/22  SUBJECTIVE:   SUBJECTIVE STATEMENT: Patient reports hamstring is tight. Not sure why. Had f/u with Dr. Aline Brochure and is doing well. Plan is to return to work in February.   Evaluation:  Patient presents to therapy with  complaint of LT knee pain s/p LT TKA on 03/10/22. Patient is doing well overall. He is concerned about his incision, he feels it is "coming apart". Pain is moderate. He is no longer taking pain medication. He is walking with no AD. He did have home health for a few weeks  PERTINENT HISTORY: LT TKA 03/10/22, RT TKA 2015   PAIN:  Are you having pain? No  PRECAUTIONS: None  WEIGHT BEARING RESTRICTIONS: No  FALLS:  Has patient fallen in last 6 months? No  LIVING ENVIRONMENT: Lives with: lives with their spouse Lives in: House/apartment Stairs: Yes: External: 1 steps; none Has following equipment at home: Single point cane and Walker - 2 wheeled  OCCUPATION: works for [employer]   PLOF: Independent  PATIENT GOALS: Have LT knee turn out as good as RT knee  NEXT MD VISIT:   OBJECTIVE:   DIAGNOSTIC FINDINGS: NA  PATIENT SURVEYS:  FOTO 49.35%  COGNITION: Overall cognitive status: Within functional limits for tasks assessed     SENSATION: WFL  EDEMA:  Mild/ moderate   PALPATION: Min TTP about incision   LOWER EXTREMITY ROM:  Active ROM Right eval Left eval Left 03/31/22 Left 04/02/22 Left 04/14/22  Hip flexion       Hip extension       Hip abduction       Hip adduction       Hip internal rotation       Hip external rotation       Knee flexion 125 90 105 114 125  Knee extension 0 -2 -2 -2 0  Ankle dorsiflexion       Ankle plantarflexion       Ankle inversion       Ankle eversion        (Blank rows = not tested)  LOWER EXTREMITY MMT:  MMT Right eval Left eval  Hip flexion 5 4+  Hip extension 4+ 4  Hip abduction 4+ 4  Hip adduction    Hip internal rotation    Hip external rotation    Knee flexion 5 4+   Knee extension 5 4  Ankle dorsiflexion 5 5  Ankle plantarflexion    Ankle inversion    Ankle eversion     (Blank rows = not tested)   FUNCTIONAL TESTS:  5 times sit to stand: 10.26 sec no UE 2 minute walk test: 450 feet with no AD   GAIT:   Decreased LT knee flexion, decreased stride   TODAY'S TREATMENT:  DATE:  04/16/22 Rec bike lv 5 5 min dynamic warmup   Slant board 3 x 30" Heel raise on incline 20X Toe raise on incline 20X  Standing hip abduction GTB 2 x 10 Standing  hip extension GTB 2 x10 Standing hip flexion GTB 2 x 10 Step up 8 inch 2  x10 HHA x 1 Step down 8 inch 2 x 10 HHA x 2   Leg press 5PL 3X10 Hamstring curl machine 5 plates 3 x 10  Manual IASTM to LT hamstring   04/14/22 Rec bike 5 min (seat 11) Level 3  Standing:  Slant board 3 x 30" Heel raise on incline 20X Toe raise on incline 20X Knee drive onto 12" step 10 x10" Step up 8 inch 2  x10 HHA x 1 Side step up 8 inch 2 x 10 HHA x 1 Step down 8 inch 2 x 10 HHA x 2  7" stair negotiation reciprocally 5RT SLS 3 x 30" on foam  Vectors on Lt LE and foam with minimal UE assist 10X5" each Leg press 5PL 3X10 AROM Lt knee supine 0-125 degrees  04/09/22 Rec bike 5 min (seat 11)  Slant board 3 x 30" Heel raise 2 x 10 Toe raise 2 x 10 Knee drive 10 R60" Step up 7 inch 2  x10 HHA x 1 Side step up 7 inch 2 x 10 HHA x 1 Step down 7 inch 2 x 10 HHA x 2  Mini squat 2 x 10 Tandem stance 2 x 20"  SLS 3  x10"   Clinic amb 1 RT no AD   Manual edema massage LT knee Scar tissue massage    (LT knee AROM 0-117 deg)     PATIENT EDUCATION:  Education details: on Eval findings, POC and HEP Person educated: Patient Education method: Explanation Education comprehension: verbalized understanding  HOME EXERCISE PROGRAM: Access Code: [email or web address] URL: [email or web address] Date: 03/26/2022 Prepared by: Josue Hector  Exercises - Supine Bridge  - 3 x daily - 7 x weekly - 2 sets - 10 reps - Supine Active Straight Leg Raise  - 3 x daily - 7 x weekly - 2 sets - 10 reps - Supine Knee Extension Mobilization with Weight  - 3 x  daily - 7 x weekly - 1 sets - 1 reps - 3-5 minutes hold  ASSESSMENT:  CLINICAL IMPRESSION: Patient progressing well. Good return with stairs and standing hip series with band resistance. Added hamstring curl machine for improved posterior chain function and reduced hamstring restriction. Also performed manual IASTM to address subjective complaint of hamstring tightness. Patient noting improved symptoms following. Patient will continue to benefit from skilled therapy services to reduce remaining deficits and improve functional ability.    OBJECTIVE IMPAIRMENTS: Abnormal gait, decreased activity tolerance, decreased balance, decreased mobility, difficulty walking, decreased ROM, decreased strength, increased edema, increased fascial restrictions, impaired flexibility, improper body mechanics, and pain.   ACTIVITY LIMITATIONS: carrying, lifting, bending, sitting, standing, squatting, sleeping, stairs, transfers, and locomotion level  PARTICIPATION LIMITATIONS: meal prep, cleaning, laundry, driving, shopping, community activity, occupation, and yard work  PERSONAL FACTORS:  NA  are also affecting patient's functional outcome.   REHAB POTENTIAL: Good  CLINICAL DECISION MAKING: Stable/uncomplicated  EVALUATION COMPLEXITY: Low   GOALS: SHORT TERM GOALS: Target date: 04/16/2022  Patient will be independent with initial HEP and self-management strategies to improve functional outcomes Baseline:  Goal status: IN PROGRESS   LONG TERM GOALS: Target date: 05/07/2022  Patient will be independent with advanced  HEP and self-management strategies to improve functional outcomes Baseline:  Goal status: IN PROGRESS  2.  Patient will improve FOTO score to predicted value to indicate improvement in functional outcomes Baseline: Complete next session  Goal status: IN PROGRESS  3.  Patient will have LT knee AROM 0-120 degrees to improve functional mobility and facilitate squatting to pick up items from  floor. Baseline: -2 to 90 deg Goal status: IN PROGRESS  4. Patient will have equal to or > 4+/5 MMT throughout LLE to improve ability to perform functional mobility, stair ambulation and ADLs.  Baseline: See MMT Goal status: IN PROGRESS  PLAN:  PT FREQUENCY: 3x/week  PT DURATION: 6 weeks  PLANNED INTERVENTIONS: Therapeutic exercises, Therapeutic activity, Neuromuscular re-education, Balance training, Gait training, Patient/Family education, Joint manipulation, Joint mobilization, Stair training, Aquatic Therapy, Dry Needling, Electrical stimulation, Spinal manipulation, Spinal mobilization, Cryotherapy, Moist heat, scar mobilization, Taping, Traction, Ultrasound, Biofeedback, Ionotophoresis '4mg'$ /ml Dexamethasone, and Manual therapy.   PLAN FOR NEXT SESSION:  Progress knee strength and ROM. Gait and balance, manual for pain and swelling as needed.   11:00 AM, 04/16/22 Josue Hector PT DPT  Physical Therapist with [facility]  [phone]

## 2022-04-21 ENCOUNTER — Ambulatory Visit (HOSPITAL_COMMUNITY): Payer: Federal, State, Local not specified - PPO

## 2022-04-21 DIAGNOSIS — M25562 Pain in left knee: Secondary | ICD-10-CM

## 2022-04-21 DIAGNOSIS — R2689 Other abnormalities of gait and mobility: Secondary | ICD-10-CM

## 2022-04-21 DIAGNOSIS — M1712 Unilateral primary osteoarthritis, left knee: Secondary | ICD-10-CM | POA: Diagnosis not present

## 2022-04-21 DIAGNOSIS — Z01818 Encounter for other preprocedural examination: Secondary | ICD-10-CM | POA: Diagnosis not present

## 2022-04-21 DIAGNOSIS — M25662 Stiffness of left knee, not elsewhere classified: Secondary | ICD-10-CM

## 2022-04-21 NOTE — Therapy (Signed)
OUTPATIENT PHYSICAL THERAPY TREATMENT   Patient Name: William Farley MRN: [id number] DOB:Jan 21, 1961, 62 y.o., male Today's Date: 04/21/2022  END OF SESSION:  PT End of Session - 04/21/22 1032     Visit Number 7    Number of Visits 18    Date for PT Re-Evaluation 05/07/22    Authorization Type [employer]BS Federal    Progress Note Due on Visit 10    PT Start Time 1032    PT Stop Time 1115    PT Time Calculation (min) 43 min    Activity Tolerance Patient tolerated treatment well    Behavior During Therapy WFL for tasks assessed/performed             Past Medical History:  Diagnosis Date   Anxiety    Arthritis    CAD (coronary artery disease)    Minimal Non-obstructive CAD seen on coronary CTA 04/2021   Depression    GERD (gastroesophageal reflux disease)    History of kidney stones    History of thrombocytopenia    Hx of syncope    PAF (paroxysmal atrial fibrillation) (HCC)    ChadsVasc Score is 0.  Monitor revealed less than 1% PAF burden.   Seizure ([place]) 02/2021   Sleep apnea    wears CPAP    Past Surgical History:  Procedure Laterality Date   APPENDECTOMY     BIOPSY  05/01/2021   Procedure: BIOPSY;  Surgeon: Rogene Houston, MD;  [facility];  Service: Endoscopy;;   CERVICAL SPINE SURGERY  2018   CHOLECYSTECTOMY     COLONOSCOPY WITH PROPOFOL N/A 05/01/2021   Procedure: COLONOSCOPY WITH PROPOFOL;  Surgeon: Rogene Houston, MD;  [facility];  Service: Endoscopy;  Laterality: N/A;  1050   CYST EXCISION     CYSTOSCOPY     CYSTOSCOPY WITH RETROGRADE PYELOGRAM, URETEROSCOPY AND STENT PLACEMENT Right 08/08/2020   Procedure: CYSTOSCOPY WITH RIGHT RETROGRADE PYELOGRAM AND RIGHT URETEROSCOPY;  Surgeon: Cleon Gustin, MD;  Location: [facility];  Service: Urology;  Laterality: Right;   EXTRACORPOREAL SHOCK WAVE LITHOTRIPSY Right 05/21/2020   Procedure: EXTRACORPOREAL SHOCK WAVE LITHOTRIPSY (ESWL);  Surgeon: Cleon Gustin, MD;  Location: [facility];   Service: Urology;  Laterality: Right;   GASTRIC BYPASS OPEN  2003   INCISIONAL HERNIA REPAIR     kidney stone removal     KNEE ARTHROSCOPY Left    multiple   NECK SURGERY     POLYPECTOMY  05/01/2021   Procedure: POLYPECTOMY;  Surgeon: Rogene Houston, MD;  [facility];  Service: Endoscopy;;   REPLACEMENT TOTAL KNEE Right    RHINOPLASTY     STONE EXTRACTION WITH BASKET Right 08/08/2020   Procedure: STONE EXTRACTION WITH BASKET;  Surgeon: Cleon Gustin, MD;  Location: [facility];  Service: Urology;  Laterality: Right;   TOTAL KNEE ARTHROPLASTY Left 03/10/2022   Procedure: TOTAL KNEE ARTHROPLASTY;  Surgeon: Carole Civil, MD;  Location: [facility];  Service: Orthopedics;  Laterality: Left;   Patient Active Problem List   Diagnosis Date Noted   Gastro-esophageal reflux disease without esophagitis 04/14/2022   Syncope and collapse 04/14/2022   Primary osteoarthritis of left knee 03/10/2022   Osteoarthritis of left knee 03/10/2022   Insomnia 02/18/2022   Nightmares associated with chronic post-traumatic stress disorder 02/18/2022   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[place]) 02/18/2022   Chronic pain syndrome 02/18/2022   Chronic post-traumatic stress disorder 02/18/2022   Difficulty controlling anger 02/18/2022   Seizures ([place])  03/07/2021   Thrombocytopenia ([place]) 03/07/2021   Status post total knee replacement 09/29/2013   Low serum testosterone level 03/11/2012    PCP: William Ham NP   REFERRING PROVIDER: Carole Civil, MD  REFERRING DIAG: [phone] (ICD-10-CM) - Unilateral primary osteoarthritis, left knee  THERAPY DIAG:  Stiffness of left knee, not elsewhere classified  Other abnormalities of gait and mobility  Left knee pain, unspecified chronicity  Rationale for Evaluation and Treatment: Rehabilitation  ONSET DATE: 03/10/22  SUBJECTIVE:   SUBJECTIVE STATEMENT: William Farley 2/14; he got his tractor stuck over the weekend and so his knee is sore and somewhat  swollen from climbing up and down to get on and off the tractor.    Evaluation:  Patient presents to therapy with complaint of LT knee pain s/p LT TKA on 03/10/22. Patient is doing well overall. He is concerned about his incision, he feels it is "coming apart". Pain is moderate. He is no longer taking pain medication. He is walking with no AD. He did have home health for a few weeks  PERTINENT HISTORY: LT TKA 03/10/22, RT TKA 2015   PAIN:  Are you having pain? No  PRECAUTIONS: None  WEIGHT BEARING RESTRICTIONS: No  FALLS:  Has patient fallen in last 6 months? No  LIVING ENVIRONMENT: Lives with: lives with their spouse Lives in: House/apartment Stairs: Yes: External: 1 steps; none Has following equipment at home: Single point cane and Walker - 2 wheeled  OCCUPATION: works for [employer]   PLOF: Independent  PATIENT GOALS: Have LT knee turn out as good as RT knee  NEXT MD VISIT:   OBJECTIVE:   DIAGNOSTIC FINDINGS: NA  PATIENT SURVEYS:  FOTO 49.35%  COGNITION: Overall cognitive status: Within functional limits for tasks assessed     SENSATION: WFL  EDEMA:  Mild/ moderate   PALPATION: Min TTP about incision   LOWER EXTREMITY ROM:  Active ROM Right eval Left eval Left 03/31/22 Left 04/02/22 Left 04/14/22 Left 04/21/22  Hip flexion        Hip extension        Hip abduction        Hip adduction        Hip internal rotation        Hip external rotation        Knee flexion 125 90 105 114 125 126  Knee extension 0 -2 -2 -2 0 -4  Ankle dorsiflexion        Ankle plantarflexion        Ankle inversion        Ankle eversion         (Blank rows = not tested)  LOWER EXTREMITY MMT:  MMT Right eval Left eval  Hip flexion 5 4+  Hip extension 4+ 4  Hip abduction 4+ 4  Hip adduction    Hip internal rotation    Hip external rotation    Knee flexion 5 4+   Knee extension 5 4  Ankle dorsiflexion 5 5  Ankle plantarflexion    Ankle inversion    Ankle eversion      (Blank rows = not tested)   FUNCTIONAL TESTS:  5 times sit to stand: 10.26 sec no UE 2 minute walk test: 450 feet with no AD   GAIT:  Decreased LT knee flexion, decreased stride   TODAY'S TREATMENT:  DATE:  04/21/22 Rec bike level 5 x 5 min dynamic warm up  Heel/toe raises 2 x 10 Slant board 5 x 20" 8" box step up and overs x 20 3# hip abduction 2 x 10 3# hip extension 2 x 10  Body craft Leg press 5 plates 3 x 10 Cybex hamstring curls 5 plates 3 x 10 Body craft TKE 4 plates x 30  AROM: left knee knee extension -4; knee flexion 125  Sit to stand 2 x 5 with right leg slightly forward     04/16/22 Rec bike lv 5 5 min dynamic warmup   Slant board 3 x 30" Heel raise on incline 20X Toe raise on incline 20X  Standing hip abduction GTB 2 x 10 Standing  hip extension GTB 2 x10 Standing hip flexion GTB 2 x 10 Step up 8 inch 2  x10 HHA x 1 Step down 8 inch 2 x 10 HHA x 2   Leg press 5PL 3X10 Hamstring curl machine 5 plates 3 x 10  Manual IASTM to LT hamstring   04/14/22 Rec bike 5 min (seat 11) Level 3  Standing:  Slant board 3 x 30" Heel raise on incline 20X Toe raise on incline 20X Knee drive onto 12" step 10 x10" Step up 8 inch 2  x10 HHA x 1 Side step up 8 inch 2 x 10 HHA x 1 Step down 8 inch 2 x 10 HHA x 2  7" stair negotiation reciprocally 5RT SLS 3 x 30" on foam  Vectors on Lt LE and foam with minimal UE assist 10X5" each Leg press 5PL 3X10 AROM Lt knee supine 0-125 degrees  04/09/22 Rec bike 5 min (seat 11)  Slant board 3 x 30" Heel raise 2 x 10 Toe raise 2 x 10 Knee drive 10 G92" Step up 7 inch 2  x10 HHA x 1 Side step up 7 inch 2 x 10 HHA x 1 Step down 7 inch 2 x 10 HHA x 2  Mini squat 2 x 10 Tandem stance 2 x 20"  SLS 3  x10"   Clinic amb 1 RT no AD   Manual edema massage LT knee Scar tissue massage    (LT knee AROM  0-117 deg)     PATIENT EDUCATION:  Education details: on Eval findings, POC and HEP Person educated: Patient Education method: Explanation Education comprehension: verbalized understanding  HOME EXERCISE PROGRAM: Access Code: [email or web address] URL: [email or web address] Date: 03/26/2022 Prepared by: Josue Hector  Exercises - Supine Bridge  - 3 x daily - 7 x weekly - 2 sets - 10 reps - Supine Active Straight Leg Raise  - 3 x daily - 7 x weekly - 2 sets - 10 reps - Supine Knee Extension Mobilization with Weight  - 3 x daily - 7 x weekly - 1 sets - 1 reps - 3-5 minutes hold  ASSESSMENT:  CLINICAL IMPRESSION: Added sit to stand with weight shifted to left to continued to work on quad strength; mild lag today due to increased swelling; otherwise progressing well.  Patient will continue to benefit from skilled therapy services to reduce remaining deficits and improve functional ability.    OBJECTIVE IMPAIRMENTS: Abnormal gait, decreased activity tolerance, decreased balance, decreased mobility, difficulty walking, decreased ROM, decreased strength, increased edema, increased fascial restrictions, impaired flexibility, improper body mechanics, and pain.   ACTIVITY LIMITATIONS: carrying, lifting, bending, sitting, standing, squatting, sleeping, stairs, transfers, and locomotion level  PARTICIPATION LIMITATIONS: meal prep, cleaning, laundry, driving, shopping,  community activity, occupation, and yard work  PERSONAL FACTORS:  NA  are also affecting patient's functional outcome.   REHAB POTENTIAL: Good  CLINICAL DECISION MAKING: Stable/uncomplicated  EVALUATION COMPLEXITY: Low   GOALS: SHORT TERM GOALS: Target date: 04/16/2022  Patient will be independent with initial HEP and self-management strategies to improve functional outcomes Baseline:  Goal status: IN PROGRESS   LONG TERM GOALS: Target date: 05/07/2022  Patient will be independent with advanced HEP and  self-management strategies to improve functional outcomes Baseline:  Goal status: IN PROGRESS  2.  Patient will improve FOTO score to predicted value to indicate improvement in functional outcomes Baseline: Complete next session  Goal status: IN PROGRESS  3.  Patient will have LT knee AROM 0-120 degrees to improve functional mobility and facilitate squatting to pick up items from floor. Baseline: -2 to 90 deg Goal status: IN PROGRESS  4. Patient will have equal to or > 4+/5 MMT throughout LLE to improve ability to perform functional mobility, stair ambulation and ADLs.  Baseline: See MMT Goal status: IN PROGRESS  PLAN:  PT FREQUENCY: 3x/week  PT DURATION: 6 weeks  PLANNED INTERVENTIONS: Therapeutic exercises, Therapeutic activity, Neuromuscular re-education, Balance training, Gait training, Patient/Family education, Joint manipulation, Joint mobilization, Stair training, Aquatic Therapy, Dry Needling, Electrical stimulation, Spinal manipulation, Spinal mobilization, Cryotherapy, Moist heat, scar mobilization, Taping, Traction, Ultrasound, Biofeedback, Ionotophoresis '4mg'$ /ml Dexamethasone, and Manual therapy.   PLAN FOR NEXT SESSION:  Progress knee strength and ROM. Gait and balance, manual for pain and swelling as needed.   11:13 AM, 04/21/22 Rashada Klontz Small Dominque Levandowski MPT [facility] [place] [phone]

## 2022-04-23 ENCOUNTER — Ambulatory Visit (HOSPITAL_COMMUNITY): Payer: Federal, State, Local not specified - PPO | Attending: Orthopedic Surgery | Admitting: Physical Therapy

## 2022-04-23 DIAGNOSIS — R2689 Other abnormalities of gait and mobility: Secondary | ICD-10-CM | POA: Diagnosis not present

## 2022-04-23 DIAGNOSIS — M25562 Pain in left knee: Secondary | ICD-10-CM | POA: Insufficient documentation

## 2022-04-23 DIAGNOSIS — M25662 Stiffness of left knee, not elsewhere classified: Secondary | ICD-10-CM | POA: Insufficient documentation

## 2022-04-23 NOTE — Therapy (Signed)
OUTPATIENT PHYSICAL THERAPY TREATMENT   Patient Name: William Farley MRN: [id number] DOB:Sep 13, 1960, 62 y.o., male Today's Date: 04/23/2022  END OF SESSION:  PT End of Session - 04/23/22 1034     Visit Number 8    Number of Visits 18    Date for PT Re-Evaluation 05/07/22    Authorization Type [employer]BS Federal    Progress Note Due on Visit 10    PT Start Time 1034    PT Stop Time 1113    PT Time Calculation (min) 39 min    Activity Tolerance Patient tolerated treatment well    Behavior During Therapy WFL for tasks assessed/performed             Past Medical History:  Diagnosis Date   Anxiety    Arthritis    CAD (coronary artery disease)    Minimal Non-obstructive CAD seen on coronary CTA 04/2021   Depression    GERD (gastroesophageal reflux disease)    History of kidney stones    History of thrombocytopenia    Hx of syncope    PAF (paroxysmal atrial fibrillation) (HCC)    ChadsVasc Score is 0.  Monitor revealed less than 1% PAF burden.   Seizure ([place]) 02/2021   Sleep apnea    wears CPAP    Past Surgical History:  Procedure Laterality Date   APPENDECTOMY     BIOPSY  05/01/2021   Procedure: BIOPSY;  Surgeon: Rogene Houston, MD;  [facility];  Service: Endoscopy;;   CERVICAL SPINE SURGERY  2018   CHOLECYSTECTOMY     COLONOSCOPY WITH PROPOFOL N/A 05/01/2021   Procedure: COLONOSCOPY WITH PROPOFOL;  Surgeon: Rogene Houston, MD;  [facility];  Service: Endoscopy;  Laterality: N/A;  1050   CYST EXCISION     CYSTOSCOPY     CYSTOSCOPY WITH RETROGRADE PYELOGRAM, URETEROSCOPY AND STENT PLACEMENT Right 08/08/2020   Procedure: CYSTOSCOPY WITH RIGHT RETROGRADE PYELOGRAM AND RIGHT URETEROSCOPY;  Surgeon: Cleon Gustin, MD;  Location[facility];  Service: Urology;  Laterality: Right;   EXTRACORPOREAL SHOCK WAVE LITHOTRIPSY Right 05/21/2020   Procedure: EXTRACORPOREAL SHOCK WAVE LITHOTRIPSY (ESWL);  Surgeon: Cleon Gustin, MD;  Location: [facility];   Service: Urology;  Laterality: Right;   GASTRIC BYPASS OPEN  2003   INCISIONAL HERNIA REPAIR     kidney stone removal     KNEE ARTHROSCOPY Left    multiple   NECK SURGERY     POLYPECTOMY  05/01/2021   Procedure: POLYPECTOMY;  Surgeon: Rogene Houston, MD;  [facility];  Service: Endoscopy;;   REPLACEMENT TOTAL KNEE Right    RHINOPLASTY     STONE EXTRACTION WITH BASKET Right 08/08/2020   Procedure: STONE EXTRACTION WITH BASKET;  Surgeon: Cleon Gustin, MD;  Location: [facility];  Service: Urology;  Laterality: Right;   TOTAL KNEE ARTHROPLASTY Left 03/10/2022   Procedure: TOTAL KNEE ARTHROPLASTY;  Surgeon: Carole Civil, MD;  Location: [facility];  Service: Orthopedics;  Laterality: Left;   Patient Active Problem List   Diagnosis Date Noted   Gastro-esophageal reflux disease without esophagitis 04/14/2022   Syncope and collapse 04/14/2022   Primary osteoarthritis of left knee 03/10/2022   Osteoarthritis of left knee 03/10/2022   Insomnia 02/18/2022   Nightmares associated with chronic post-traumatic stress disorder 02/18/2022   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[place]) 02/18/2022   Chronic pain syndrome 02/18/2022   Chronic post-traumatic stress disorder 02/18/2022   Difficulty controlling anger 02/18/2022   Seizures ([place])  03/07/2021   Thrombocytopenia ([place]) 03/07/2021   Status post total knee replacement 09/29/2013   Low serum testosterone level 03/11/2012    PCP: Garey Ham NP   REFERRING PROVIDER: Carole Civil, MD  REFERRING DIAG: [phone] (ICD-10-CM) - Unilateral primary osteoarthritis, left knee  THERAPY DIAG:  Stiffness of left knee, not elsewhere classified  Left knee pain, unspecified chronicity  Other abnormalities of gait and mobility  Rationale for Evaluation and Treatment: Rehabilitation  ONSET DATE: 03/10/22  SUBJECTIVE:   SUBJECTIVE STATEMENT: Knee is a little sore from doing exercises yesterday. Overall not bad, about a 3.   Evaluation:   Patient presents to therapy with complaint of LT knee pain s/p LT TKA on 03/10/22. Patient is doing well overall. He is concerned about his incision, he feels it is "coming apart". Pain is moderate. He is no longer taking pain medication. He is walking with no AD. He did have home health for a few weeks  PERTINENT HISTORY: LT TKA 03/10/22, RT TKA 2015   PAIN:  Are you having pain? Yes: NPRS scale: 3/10 Pain location: Lt knee  Pain description: sore, aching, swollen  Aggravating factors: standing, walking, bending, WB Relieving factors: rest, non WB  PRECAUTIONS: None  WEIGHT BEARING RESTRICTIONS: No  FALLS:  Has patient fallen in last 6 months? No  LIVING ENVIRONMENT: Lives with: lives with their spouse Lives in: House/apartment Stairs: Yes: External: 1 steps; none Has following equipment at home: Single point cane and Walker - 2 wheeled  OCCUPATION: works for [employer]   PLOF: Independent  PATIENT GOALS: Have LT knee turn out as good as RT knee  NEXT MD VISIT:   OBJECTIVE:   DIAGNOSTIC FINDINGS: NA  PATIENT SURVEYS:  FOTO 49.35%  COGNITION: Overall cognitive status: Within functional limits for tasks assessed     SENSATION: WFL  EDEMA:  Mild/ moderate   PALPATION: Min TTP about incision   LOWER EXTREMITY ROM:  Active ROM Right eval Left eval Left 03/31/22 Left 04/02/22 Left 04/14/22 Left 04/21/22  Hip flexion        Hip extension        Hip abduction        Hip adduction        Hip internal rotation        Hip external rotation        Knee flexion 125 90 105 114 125 126  Knee extension 0 -2 -2 -2 0 -4  Ankle dorsiflexion        Ankle plantarflexion        Ankle inversion        Ankle eversion         (Blank rows = not tested)  LOWER EXTREMITY MMT:  MMT Right eval Left eval  Hip flexion 5 4+  Hip extension 4+ 4  Hip abduction 4+ 4  Hip adduction    Hip internal rotation    Hip external rotation    Knee flexion 5 4+   Knee extension 5 4   Ankle dorsiflexion 5 5  Ankle plantarflexion    Ankle inversion    Ankle eversion     (Blank rows = not tested)   FUNCTIONAL TESTS:  5 times sit to stand: 10.26 sec no UE 2 minute walk test: 450 feet with no AD   GAIT:  Decreased LT knee flexion, decreased stride   TODAY'S TREATMENT:  DATE:  04/23/22 Rec bike lv 3 5 min   Slant board 3 x 30"  Heel raise 2 x10  Toe raise 2 x 10  TKE 4 plates x20  Retro walk 4 plates x10  Leg press 6 plates 2 x 10 Hamstring curl 6 plates 2 x 10  SLS on foam 2 x 30" SLS vectors on foam 3 way 2 x 10" Stairs 7 inch 4 RT reciprocal   Seated heel prop with 2lb 3 min    04/21/22 Rec bike level 5 x 5 min dynamic warm up  Heel/toe raises 2 x 10 Slant board 5 x 20" 8" box step up and overs x 20 3# hip abduction 2 x 10 3# hip extension 2 x 10  Body craft Leg press 5 plates 3 x 10 Cybex hamstring curls 5 plates 3 x 10 Body craft TKE 4 plates x 30  AROM: left knee knee extension -4; knee flexion 125  Sit to stand 2 x 5 with right leg slightly forward  04/16/22 Rec bike lv 5 5 min dynamic warmup   Slant board 3 x 30" Heel raise on incline 20X Toe raise on incline 20X  Standing hip abduction GTB 2 x 10 Standing  hip extension GTB 2 x10 Standing hip flexion GTB 2 x 10 Step up 8 inch 2  x10 HHA x 1 Step down 8 inch 2 x 10 HHA x 2   Leg press 5PL 3X10 Hamstring curl machine 5 plates 3 x 10  Manual IASTM to LT hamstring     PATIENT EDUCATION:  Education details: on Eval findings, POC and HEP Person educated: Patient Education method: Explanation Education comprehension: verbalized understanding  HOME EXERCISE PROGRAM: Access Code: [email or web address] URL: [email or web address] Date: 03/26/2022 Prepared by: Josue Hector  Exercises - Supine Bridge  - 3 x daily - 7 x weekly - 2 sets - 10 reps -  Supine Active Straight Leg Raise  - 3 x daily - 7 x weekly - 2 sets - 10 reps - Supine Knee Extension Mobilization with Weight  - 3 x daily - 7 x weekly - 1 sets - 1 reps - 3-5 minutes hold  ASSESSMENT:  CLINICAL IMPRESSION: Patient progressing well. Progressed resistance exercises for improve LE strengthening. Patient now able to ambulate 7 inch steps with reciprocal pattern and minimal compensation. Slight knee extension restriction ongoing. Added seated heel prop to address. Patient educated on purpose and function. Patient will continue to benefit from skilled therapy services to reduce remaining deficits and improve functional ability.    OBJECTIVE IMPAIRMENTS: Abnormal gait, decreased activity tolerance, decreased balance, decreased mobility, difficulty walking, decreased ROM, decreased strength, increased edema, increased fascial restrictions, impaired flexibility, improper body mechanics, and pain.   ACTIVITY LIMITATIONS: carrying, lifting, bending, sitting, standing, squatting, sleeping, stairs, transfers, and locomotion level  PARTICIPATION LIMITATIONS: meal prep, cleaning, laundry, driving, shopping, community activity, occupation, and yard work  PERSONAL FACTORS:  NA  are also affecting patient's functional outcome.   REHAB POTENTIAL: Good  CLINICAL DECISION MAKING: Stable/uncomplicated  EVALUATION COMPLEXITY: Low   GOALS: SHORT TERM GOALS: Target date: 04/16/2022  Patient will be independent with initial HEP and self-management strategies to improve functional outcomes Baseline:  Goal status: IN PROGRESS   LONG TERM GOALS: Target date: 05/07/2022  Patient will be independent with advanced HEP and self-management strategies to improve functional outcomes Baseline:  Goal status: IN PROGRESS  2.  Patient will improve FOTO score to predicted value to indicate improvement in functional  outcomes Baseline: Complete next session  Goal status: IN PROGRESS  3.  Patient will  have LT knee AROM 0-120 degrees to improve functional mobility and facilitate squatting to pick up items from floor. Baseline: -2 to 90 deg Goal status: IN PROGRESS  4. Patient will have equal to or > 4+/5 MMT throughout LLE to improve ability to perform functional mobility, stair ambulation and ADLs.  Baseline: See MMT Goal status: IN PROGRESS  PLAN:  PT FREQUENCY: 3x/week  PT DURATION: 6 weeks  PLANNED INTERVENTIONS: Therapeutic exercises, Therapeutic activity, Neuromuscular re-education, Balance training, Gait training, Patient/Family education, Joint manipulation, Joint mobilization, Stair training, Aquatic Therapy, Dry Needling, Electrical stimulation, Spinal manipulation, Spinal mobilization, Cryotherapy, Moist heat, scar mobilization, Taping, Traction, Ultrasound, Biofeedback, Ionotophoresis '4mg'$ /ml Dexamethasone, and Manual therapy.   PLAN FOR NEXT SESSION:  Progress knee strength and ROM. Gait and balance, manual for pain and swelling as needed.   11:15 AM, 04/23/22 Josue Hector PT DPT  Physical Therapist with [facility]  [phone]

## 2022-04-28 ENCOUNTER — Encounter (HOSPITAL_COMMUNITY): Payer: Self-pay | Admitting: Physical Therapy

## 2022-04-28 ENCOUNTER — Ambulatory Visit (HOSPITAL_COMMUNITY): Payer: Federal, State, Local not specified - PPO | Admitting: Physical Therapy

## 2022-04-28 DIAGNOSIS — R2689 Other abnormalities of gait and mobility: Secondary | ICD-10-CM | POA: Diagnosis not present

## 2022-04-28 DIAGNOSIS — M25662 Stiffness of left knee, not elsewhere classified: Secondary | ICD-10-CM | POA: Diagnosis not present

## 2022-04-28 DIAGNOSIS — M25562 Pain in left knee: Secondary | ICD-10-CM | POA: Diagnosis not present

## 2022-04-28 NOTE — Therapy (Signed)
OUTPATIENT PHYSICAL THERAPY TREATMENT   Patient Name: William Farley MRN: [id number] DOB:12-22-1960, 62 y.o., male Today's Date: 04/28/2022  END OF SESSION:  PT End of Session - 04/28/22 1037     Visit Number 9    Number of Visits 18    Date for PT Re-Evaluation 05/07/22    Authorization Type [employer]BS Federal    Progress Note Due on Visit 10    PT Start Time 1034    PT Stop Time 1114    PT Time Calculation (min) 40 min    Activity Tolerance Patient tolerated treatment well    Behavior During Therapy WFL for tasks assessed/performed             Past Medical History:  Diagnosis Date   Anxiety    Arthritis    CAD (coronary artery disease)    Minimal Non-obstructive CAD seen on coronary CTA 04/2021   Depression    GERD (gastroesophageal reflux disease)    History of kidney stones    History of thrombocytopenia    Hx of syncope    PAF (paroxysmal atrial fibrillation) (HCC)    ChadsVasc Score is 0.  Monitor revealed less than 1% PAF burden.   Seizure ([place]) 02/2021   Sleep apnea    wears CPAP    Past Surgical History:  Procedure Laterality Date   APPENDECTOMY     BIOPSY  05/01/2021   Procedure: BIOPSY;  Surgeon: Rogene Houston, MD;  [facility];  Service: Endoscopy;;   CERVICAL SPINE SURGERY  2018   CHOLECYSTECTOMY     COLONOSCOPY WITH PROPOFOL N/A 05/01/2021   Procedure: COLONOSCOPY WITH PROPOFOL;  Surgeon: Rogene Houston, MD;  [facility];  Service: Endoscopy;  Laterality: N/A;  1050   CYST EXCISION     CYSTOSCOPY     CYSTOSCOPY WITH RETROGRADE PYELOGRAM, URETEROSCOPY AND STENT PLACEMENT Right 08/08/2020   Procedure: CYSTOSCOPY WITH RIGHT RETROGRADE PYELOGRAM AND RIGHT URETEROSCOPY;  Surgeon: Cleon Gustin, MD;  Location: [facility];  Service: Urology;  Laterality: Right;   EXTRACORPOREAL SHOCK WAVE LITHOTRIPSY Right 05/21/2020   Procedure: EXTRACORPOREAL SHOCK WAVE LITHOTRIPSY (ESWL);  Surgeon: Cleon Gustin, MD;  Location: [facility];   Service: Urology;  Laterality: Right;   GASTRIC BYPASS OPEN  2003   INCISIONAL HERNIA REPAIR     kidney stone removal     KNEE ARTHROSCOPY Left    multiple   NECK SURGERY     POLYPECTOMY  05/01/2021   Procedure: POLYPECTOMY;  Surgeon: Rogene Houston, MD;  [facility];  Service: Endoscopy;;   REPLACEMENT TOTAL KNEE Right    RHINOPLASTY     STONE EXTRACTION WITH BASKET Right 08/08/2020   Procedure: STONE EXTRACTION WITH BASKET;  Surgeon: Cleon Gustin, MD;  Location: [facility];  Service: Urology;  Laterality: Right;   TOTAL KNEE ARTHROPLASTY Left 03/10/2022   Procedure: TOTAL KNEE ARTHROPLASTY;  Surgeon: Carole Civil, MD;  Location: [facility];  Service: Orthopedics;  Laterality: Left;   Patient Active Problem List   Diagnosis Date Noted   Gastro-esophageal reflux disease without esophagitis 04/14/2022   Syncope and collapse 04/14/2022   Primary osteoarthritis of left knee 03/10/2022   Osteoarthritis of left knee 03/10/2022   Insomnia 02/18/2022   Nightmares associated with chronic post-traumatic stress disorder 02/18/2022   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[place]) 02/18/2022   Chronic pain syndrome 02/18/2022   Chronic post-traumatic stress disorder 02/18/2022   Difficulty controlling anger 02/18/2022   Seizures ([place])  03/07/2021   Thrombocytopenia ([place]) 03/07/2021   Status post total knee replacement 09/29/2013   Low serum testosterone level 03/11/2012    PCP: Garey Ham NP   REFERRING PROVIDER: Carole Civil, MD  REFERRING DIAG: [phone] (ICD-10-CM) - Unilateral primary osteoarthritis, left knee  THERAPY DIAG:  Stiffness of left knee, not elsewhere classified  Left knee pain, unspecified chronicity  Other abnormalities of gait and mobility  Rationale for Evaluation and Treatment: Rehabilitation  ONSET DATE: 03/10/22  SUBJECTIVE:   SUBJECTIVE STATEMENT: Mild soreness ongoing. Was working on his tractor which likely contributed. Otherwise doing fairly  well.   Evaluation:  Patient presents to therapy with complaint of LT knee pain s/p LT TKA on 03/10/22. Patient is doing well overall. He is concerned about his incision, he feels it is "coming apart". Pain is moderate. He is no longer taking pain medication. He is walking with no AD. He did have home health for a few weeks  PERTINENT HISTORY: LT TKA 03/10/22, RT TKA 2015   PAIN:  Are you having pain? Yes: NPRS scale: 3/10 Pain location: Lt knee  Pain description: sore, aching, swollen  Aggravating factors: standing, walking, bending, WB Relieving factors: rest, non WB  PRECAUTIONS: None  WEIGHT BEARING RESTRICTIONS: No  FALLS:  Has patient fallen in last 6 months? No  LIVING ENVIRONMENT: Lives with: lives with their spouse Lives in: House/apartment Stairs: Yes: External: 1 steps; none Has following equipment at home: Single point cane and Walker - 2 wheeled  OCCUPATION: works for [employer]   PLOF: Independent  PATIENT GOALS: Have LT knee turn out as good as RT knee  NEXT MD VISIT:   OBJECTIVE:   DIAGNOSTIC FINDINGS: NA  PATIENT SURVEYS:  FOTO 49.35%  COGNITION: Overall cognitive status: Within functional limits for tasks assessed     SENSATION: WFL  EDEMA:  Mild/ moderate   PALPATION: Min TTP about incision   LOWER EXTREMITY ROM:  Active ROM Right eval Left eval Left 03/31/22 Left 04/02/22 Left 04/14/22 Left 04/21/22 Left 04/28/22  Hip flexion         Hip extension         Hip abduction         Hip adduction         Hip internal rotation         Hip external rotation         Knee flexion 125 90 105 114 125 126 123  Knee extension 0 -2 -2 -2 0 -4 0  Ankle dorsiflexion         Ankle plantarflexion         Ankle inversion         Ankle eversion          (Blank rows = not tested)  LOWER EXTREMITY MMT:  MMT Right eval Left eval  Hip flexion 5 4+  Hip extension 4+ 4  Hip abduction 4+ 4  Hip adduction    Hip internal rotation    Hip external  rotation    Knee flexion 5 4+   Knee extension 5 4  Ankle dorsiflexion 5 5  Ankle plantarflexion    Ankle inversion    Ankle eversion     (Blank rows = not tested)   FUNCTIONAL TESTS:  5 times sit to stand: 10.26 sec no UE 2 minute walk test: 450 feet with no AD   GAIT:  Decreased LT knee flexion, decreased stride   TODAY'S TREATMENT:  DATE:   04/28/22 Rec bike lv 3 5 min   Slant board 3 x 30"  Heel raise 2 x10  Toe raise 2 x 10 8 inch power up 2 x 10 Band sidestep GTB 4 RT  TKE 4 plates x20  Retro walk 5 plates x10  Leg press 6 plates 2 x 10 Hamstring curl 6 plates 2 x 10  SLS vectors on foam 3 way 2 x 10" Tandem gait 2 RT   Prone quad stretch 3 x 30"  Prone hang 5 x 10"   04/23/22 Rec bike lv 3 5 min   Slant board 3 x 30"  Heel raise 2 x10  Toe raise 2 x 10  TKE 4 plates x20  Retro walk 4 plates x10  Leg press 6 plates 2 x 10 Hamstring curl 6 plates 2 x 10  SLS on foam 2 x 30" SLS vectors on foam 3 way 2 x 10" Stairs 7 inch 4 RT reciprocal   Seated heel prop with 2lb 3 min    04/21/22 Rec bike level 5 x 5 min dynamic warm up  Heel/toe raises 2 x 10 Slant board 5 x 20" 8" box step up and overs x 20 3# hip abduction 2 x 10 3# hip extension 2 x 10  Body craft Leg press 5 plates 3 x 10 Cybex hamstring curls 5 plates 3 x 10 Body craft TKE 4 plates x 30  AROM: left knee knee extension -4; knee flexion 125  Sit to stand 2 x 5 with right leg slightly forward   PATIENT EDUCATION:  Education details: on Eval findings, POC and HEP Person educated: Patient Education method: Explanation Education comprehension: verbalized understanding  HOME EXERCISE PROGRAM: Access Code: [email or web address] URL: [email or web address] 04/28/22  Access Code: [email or web address] URL: [email or web address] Date: 04/28/2022 Prepared by:  Josue Hector  Exercises - Supine Bridge  - 2 x daily - 7 x weekly - 2 sets - 10 reps - Supine Active Straight Leg Raise  - 2 x daily - 7 x weekly - 2 sets - 10 reps - Supine Knee Extension Mobilization with Weight  - 2 x daily - 7 x weekly - 1 sets - 1 reps - 3-5 minutes hold - Standing Heel Raise  - 2 x daily - 7 x weekly - 2 sets - 10 reps - Standing Toe Raises at Chair  - 2 x daily - 7 x weekly - 2 sets - 10 reps - Squat with Chair Touch  - 2 x daily - 7 x weekly - 2 sets - 10 reps - Side Stepping with Resistance at Ankles  - 2 x daily - 7 x weekly - 2 sets - 10 reps - Standing Terminal Knee Extension with Resistance  - 2 x daily - 7 x weekly - 2 sets - 10 reps - Single Leg Balance on Foam Pad  - 2 x daily - 7 x weekly - 1 sets - 3 reps - 30 second hold - Prone Quadriceps Stretch with Strap  - 2 x daily - 7 x weekly - 1 sets - 5 reps - 20 second hold - Prone Knee Extension with Ankle Weight  - 2 x daily - 7 x weekly - 1 sets - 1 reps - 3-5 minutes  hold  Date: 03/26/2022 Prepared by: Josue Hector  Exercises - Supine Bridge  - 3 x daily - 7 x weekly - 2 sets - 10 reps - Supine Active Straight Leg Raise  -  3 x daily - 7 x weekly - 2 sets - 10 reps - Supine Knee Extension Mobilization with Weight  - 3 x daily - 7 x weekly - 1 sets - 1 reps - 3-5 minutes hold  ASSESSMENT:  CLINICAL IMPRESSION: Patient showing continued progress. Increased resistance with strengthening exercise. Progressed dynamic balance with added tandem gait and 8 inch box power ups. Patient tolerated well with no increased complaint of knee pain. Shows good functional mobility and normalized AROM. He remains slightly limited by mild edema. Discussed continued use of compression garments, elevating, icing etc to help reduce this. Reassess next session with anticipated DC if all goals met.    OBJECTIVE IMPAIRMENTS: Abnormal gait, decreased activity tolerance, decreased balance, decreased mobility, difficulty  walking, decreased ROM, decreased strength, increased edema, increased fascial restrictions, impaired flexibility, improper body mechanics, and pain.   ACTIVITY LIMITATIONS: carrying, lifting, bending, sitting, standing, squatting, sleeping, stairs, transfers, and locomotion level  PARTICIPATION LIMITATIONS: meal prep, cleaning, laundry, driving, shopping, community activity, occupation, and yard work  PERSONAL FACTORS:  NA  are also affecting patient's functional outcome.   REHAB POTENTIAL: Good  CLINICAL DECISION MAKING: Stable/uncomplicated  EVALUATION COMPLEXITY: Low   GOALS: SHORT TERM GOALS: Target date: 04/16/2022  Patient will be independent with initial HEP and self-management strategies to improve functional outcomes Baseline:  Goal status: IN PROGRESS   LONG TERM GOALS: Target date: 05/07/2022  Patient will be independent with advanced HEP and self-management strategies to improve functional outcomes Baseline:  Goal status: IN PROGRESS  2.  Patient will improve FOTO score to predicted value to indicate improvement in functional outcomes Baseline: Complete next session  Goal status: IN PROGRESS  3.  Patient will have LT knee AROM 0-120 degrees to improve functional mobility and facilitate squatting to pick up items from floor. Baseline: -2 to 90 deg Goal status: IN PROGRESS  4. Patient will have equal to or > 4+/5 MMT throughout LLE to improve ability to perform functional mobility, stair ambulation and ADLs.  Baseline: See MMT Goal status: IN PROGRESS  PLAN:  PT FREQUENCY: 3x/week  PT DURATION: 6 weeks  PLANNED INTERVENTIONS: Therapeutic exercises, Therapeutic activity, Neuromuscular re-education, Balance training, Gait training, Patient/Family education, Joint manipulation, Joint mobilization, Stair training, Aquatic Therapy, Dry Needling, Electrical stimulation, Spinal manipulation, Spinal mobilization, Cryotherapy, Moist heat, scar mobilization, Taping,  Traction, Ultrasound, Biofeedback, Ionotophoresis '4mg'$ /ml Dexamethasone, and Manual therapy.   PLAN FOR NEXT SESSION:  Progress knee strength and ROM. Gait and balance, manual for pain and swelling as needed.   10:38 AM, 04/28/22 Josue Hector PT DPT  Physical Therapist with [facility]  [phone]

## 2022-04-29 ENCOUNTER — Encounter (HOSPITAL_COMMUNITY): Payer: Self-pay | Admitting: Orthopedic Surgery

## 2022-04-30 ENCOUNTER — Ambulatory Visit (HOSPITAL_COMMUNITY): Payer: Federal, State, Local not specified - PPO | Admitting: Physical Therapy

## 2022-04-30 ENCOUNTER — Encounter (HOSPITAL_COMMUNITY): Payer: Self-pay | Admitting: Physical Therapy

## 2022-04-30 DIAGNOSIS — M25662 Stiffness of left knee, not elsewhere classified: Secondary | ICD-10-CM

## 2022-04-30 DIAGNOSIS — M25562 Pain in left knee: Secondary | ICD-10-CM | POA: Diagnosis not present

## 2022-04-30 DIAGNOSIS — R2689 Other abnormalities of gait and mobility: Secondary | ICD-10-CM | POA: Diagnosis not present

## 2022-04-30 NOTE — Therapy (Signed)
OUTPATIENT PHYSICAL THERAPY TREATMENT   Patient Name: William Farley MRN: [id number] DOB:16-Apr-1960, 62 y.o., male Today's Date: 04/30/2022  PHYSICAL THERAPY DISCHARGE SUMMARY  Visits from Start of Care: 10  Current functional level related to goals / functional outcomes: See below    Remaining deficits: See below    Education / Equipment: See assessment    Patient agrees to discharge. Patient goals were met. Patient is being discharged due to meeting the stated rehab goals.  END OF SESSION:  PT End of Session - 04/30/22 1038     Visit Number 10    Number of Visits 18    Date for PT Re-Evaluation 05/07/22    Authorization Type BCBS Federal    Progress Note Due on Visit 10    PT Start Time 1035    PT Stop Time 1113    PT Time Calculation (min) 38 min    Activity Tolerance Patient tolerated treatment well    Behavior During Therapy WFL for tasks assessed/performed             Past Medical History:  Diagnosis Date   Anxiety    Arthritis    CAD (coronary artery disease)    Minimal Non-obstructive CAD seen on coronary CTA 04/2021   Depression    GERD (gastroesophageal reflux disease)    History of kidney stones    History of thrombocytopenia    Hx of syncope    PAF (paroxysmal atrial fibrillation) (HCC)    ChadsVasc Score is 0.  Monitor revealed less than 1% PAF burden.   Seizure ([place]) 02/2021   Sleep apnea    wears CPAP    Past Surgical History:  Procedure Laterality Date   APPENDECTOMY     BIOPSY  05/01/2021   Procedure: BIOPSY;  Surgeon: Rogene Houston, MD;  [facility];  Service: Endoscopy;;   CERVICAL SPINE SURGERY  2018   CHOLECYSTECTOMY     COLONOSCOPY WITH PROPOFOL N/A 05/01/2021   Procedure: COLONOSCOPY WITH PROPOFOL;  Surgeon: Rogene Houston, MD;  [facility];  Service: Endoscopy;  Laterality: N/A;  1050   CYST EXCISION     CYSTOSCOPY     CYSTOSCOPY WITH RETROGRADE PYELOGRAM, URETEROSCOPY AND STENT PLACEMENT Right  08/08/2020   Procedure: CYSTOSCOPY WITH RIGHT RETROGRADE PYELOGRAM AND RIGHT URETEROSCOPY;  Surgeon: Cleon Gustin, MD;  Location: [facility];  Service: Urology;  Laterality: Right;   EXTRACORPOREAL SHOCK WAVE LITHOTRIPSY Right 05/21/2020   Procedure: EXTRACORPOREAL SHOCK WAVE LITHOTRIPSY (ESWL);  Surgeon: Cleon Gustin, MD;  Location: [facility];  Service: Urology;  Laterality: Right;   GASTRIC BYPASS OPEN  2003   INCISIONAL HERNIA REPAIR     kidney stone removal     KNEE ARTHROSCOPY Left    multiple   NECK SURGERY     POLYPECTOMY  05/01/2021   Procedure: POLYPECTOMY;  Surgeon: Rogene Houston, MD;  [facility];  Service: Endoscopy;;   REPLACEMENT TOTAL KNEE Right    RHINOPLASTY     STONE EXTRACTION WITH BASKET Right 08/08/2020   Procedure: STONE EXTRACTION WITH BASKET;  Surgeon: Cleon Gustin, MD;  Location: [facility];  Service: Urology;  Laterality: Right;   TOTAL KNEE ARTHROPLASTY Left 03/10/2022   Procedure: TOTAL KNEE ARTHROPLASTY;  Surgeon: Carole Civil, MD;  Location: [facility];  Service: Orthopedics;  Laterality: Left;   Patient Active Problem List   Diagnosis Date Noted   Gastro-esophageal reflux disease without esophagitis 04/14/2022   Syncope  and collapse 04/14/2022   Primary osteoarthritis of left knee 03/10/2022   Osteoarthritis of left knee 03/10/2022   Insomnia 02/18/2022   Nightmares associated with chronic post-traumatic stress disorder 02/18/2022   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   Chronic pain syndrome 02/18/2022   Chronic post-traumatic stress disorder 02/18/2022   Difficulty controlling anger 02/18/2022   Seizures ([place]) 03/07/2021   Thrombocytopenia ([place]) 03/07/2021   Status post total knee replacement 09/29/2013   Low serum testosterone level 03/11/2012    PCP: Garey Ham NP   REFERRING PROVIDER: Carole Civil, MD  REFERRING DIAG: [phone] (ICD-10-CM) - Unilateral primary osteoarthritis, left knee  THERAPY DIAG:   Stiffness of left knee, not elsewhere classified  Left knee pain, unspecified chronicity  Rationale for Evaluation and Treatment: Rehabilitation  ONSET DATE: 03/10/22  SUBJECTIVE:   SUBJECTIVE STATEMENT: Patient says he is doing well. He is able to do most activity around home, and has been able to kneel to perform various tasks and yard work. He does note slight ongoing swelling. He says he feels that his LT leg isn't as strong as it should be yet. He reports 80-85% improves overall.   Evaluation:  Patient presents to therapy with complaint of LT knee pain s/p LT TKA on 03/10/22. Patient is doing well overall. He is concerned about his incision, he feels it is "coming apart". Pain is moderate. He is no longer taking pain medication. He is walking with no AD. He did have home health for a few weeks  PERTINENT HISTORY: LT TKA 03/10/22, RT TKA 2015   PAIN:  Are you having pain? Yes: NPRS scale: 3/10 Pain location: Lt knee  Pain description: sore, aching, swollen  Aggravating factors: standing, walking, bending, WB Relieving factors: rest, non WB  PRECAUTIONS: None  WEIGHT BEARING RESTRICTIONS: No  FALLS:  Has patient fallen in last 6 months? No  LIVING ENVIRONMENT: Lives with: lives with their spouse Lives in: House/apartment Stairs: Yes: External: 1 steps; none Has following equipment at home: Single point cane and Walker - 2 wheeled  OCCUPATION: works for [employer]   PLOF: Independent  PATIENT GOALS: Have LT knee turn out as good as RT knee  NEXT MD VISIT:   OBJECTIVE:   DIAGNOSTIC FINDINGS: NA  PATIENT SURVEYS:  FOTO 69% (was 49.35%)  COGNITION: Overall cognitive status: Within functional limits for tasks assessed     SENSATION: WFL  EDEMA:  Mild/ moderate   PALPATION: Min TTP about incision   LOWER EXTREMITY ROM:  Active ROM Right eval Left eval Left 03/31/22 Left 04/02/22 Left 04/14/22 Left 04/21/22 Left 04/28/22 Left 04/30/22  Hip flexion           Hip extension          Hip abduction          Hip adduction          Hip internal rotation          Hip external rotation          Knee flexion 125 90 105 114 125 126 123 126  Knee extension 0 -2 -2 -2 0 -4 0 0  Ankle dorsiflexion          Ankle plantarflexion          Ankle inversion          Ankle eversion           (Blank rows = not tested)  LOWER EXTREMITY MMT:  MMT Right eval Left eval Right  04/30/22 Left 04/30/22  Hip flexion 5 4+ 5 5  Hip extension 4+ 4 4+ 4+  Hip abduction 4+ 4 5 4+  Hip adduction      Hip internal rotation      Hip external rotation      Knee flexion 5 4+  5 5  Knee extension '5 4 5 '$ 4+  Ankle dorsiflexion '5 5 5 5  '$ Ankle plantarflexion      Ankle inversion      Ankle eversion       (Blank rows = not tested)   FUNCTIONAL TESTS:  5 times sit to stand: 10.26 sec no UE 2 minute walk test: 450 feet with no AD   GAIT:  Decreased LT knee flexion, decreased stride   TODAY'S TREATMENT:                                                                                                                              DATE:  04/30/22 Reassess FOTO AROM MMT  Rec bike lv 5 5 min Prone quad stretch 3 x 30" Manual OP for knee extension 10 x 5"   04/28/22 Rec bike lv 3 5 min   Slant board 3 x 30"  Heel raise 2 x10  Toe raise 2 x 10 8 inch power up 2 x 10 Band sidestep GTB 4 RT  TKE 4 plates x20  Retro walk 5 plates x10  Leg press 6 plates 2 x 10 Hamstring curl 6 plates 2 x 10  SLS vectors on foam 3 way 2 x 10" Tandem gait 2 RT   Prone quad stretch 3 x 30"  Prone hang 5 x 10"   04/23/22 Rec bike lv 3 5 min   Slant board 3 x 30"  Heel raise 2 x10  Toe raise 2 x 10  TKE 4 plates x20  Retro walk 4 plates x10  Leg press 6 plates 2 x 10 Hamstring curl 6 plates 2 x 10  SLS on foam 2 x 30" SLS vectors on foam 3 way 2 x 10" Stairs 7 inch 4 RT reciprocal   Seated heel prop with 2lb 3 min     PATIENT EDUCATION:  Education details: on Eval  findings, POC and HEP Person educated: Patient Education method: Explanation Education comprehension: verbalized understanding  HOME EXERCISE PROGRAM: Access Code: [email or web address] URL: [email or web address] 04/28/22  Access Code: [email or web address] URL: [email or web address] Date: 04/28/2022 Prepared by: Josue Hector  Exercises - Supine Bridge  - 2 x daily - 7 x weekly - 2 sets - 10 reps - Supine Active Straight Leg Raise  - 2 x daily - 7 x weekly - 2 sets - 10 reps - Supine Knee Extension Mobilization with Weight  - 2 x daily - 7 x weekly - 1 sets - 1 reps - 3-5 minutes hold - Standing Heel Raise  - 2 x daily - 7 x weekly - 2 sets - 10 reps - Standing  Toe Raises at Chair  - 2 x daily - 7 x weekly - 2 sets - 10 reps - Squat with Chair Touch  - 2 x daily - 7 x weekly - 2 sets - 10 reps - Side Stepping with Resistance at Ankles  - 2 x daily - 7 x weekly - 2 sets - 10 reps - Standing Terminal Knee Extension with Resistance  - 2 x daily - 7 x weekly - 2 sets - 10 reps - Single Leg Balance on Foam Pad  - 2 x daily - 7 x weekly - 1 sets - 3 reps - 30 second hold - Prone Quadriceps Stretch with Strap  - 2 x daily - 7 x weekly - 1 sets - 5 reps - 20 second hold - Prone Knee Extension with Ankle Weight  - 2 x daily - 7 x weekly - 1 sets - 1 reps - 3-5 minutes  hold  Date: 03/26/2022 Prepared by: Josue Hector  Exercises - Supine Bridge  - 3 x daily - 7 x weekly - 2 sets - 10 reps - Supine Active Straight Leg Raise  - 3 x daily - 7 x weekly - 2 sets - 10 reps - Supine Knee Extension Mobilization with Weight  - 3 x daily - 7 x weekly - 1 sets - 1 reps - 3-5 minutes hold  ASSESSMENT:  CLINICAL IMPRESSION: Patient shows very good progress and has currently met all therapy goals. [place]os good functional strength, full AROM, good activity tolerance and balance. Is able to ambulate level and unlevel surfaces without issues, also able to navigate stairs well. Patient being DC today  with all therapy goals met. Reviewed comprehensive HEP and answered all patient question. Patient encouraged to follow up with therapy services with any further questions or concerns.   OBJECTIVE IMPAIRMENTS: Abnormal gait, decreased activity tolerance, decreased balance, decreased mobility, difficulty walking, decreased ROM, decreased strength, increased edema, increased fascial restrictions, impaired flexibility, improper body mechanics, and pain.   ACTIVITY LIMITATIONS: carrying, lifting, bending, sitting, standing, squatting, sleeping, stairs, transfers, and locomotion level  PARTICIPATION LIMITATIONS: meal prep, cleaning, laundry, driving, shopping, community activity, occupation, and yard work  PERSONAL FACTORS:  NA  are also affecting patient's functional outcome.   REHAB POTENTIAL: Good  CLINICAL DECISION MAKING: Stable/uncomplicated  EVALUATION COMPLEXITY: Low   GOALS: SHORT TERM GOALS: Target date: 04/16/2022  Patient will be independent with initial HEP and self-management strategies to improve functional outcomes Baseline:  Goal status: MET   LONG TERM GOALS: Target date: 05/07/2022  Patient will be independent with advanced HEP and self-management strategies to improve functional outcomes Baseline:  Goal status: MET  2.  Patient will improve FOTO score to predicted value to indicate improvement in functional outcomes Baseline: 69% function  Goal status: MET  3.  Patient will have LT knee AROM 0-120 degrees to improve functional mobility and facilitate squatting to pick up items from floor. Baseline: 0-126 deg  Goal status: MET  4. Patient will have equal to or > 4+/5 MMT throughout LLE to improve ability to perform functional mobility, stair ambulation and ADLs.  Baseline: See MMT Goal status: MET  PLAN:  PT FREQUENCY: 3x/week  PT DURATION: 6 weeks  PLANNED INTERVENTIONS: Therapeutic exercises, Therapeutic activity, Neuromuscular re-education, Balance  training, Gait training, Patient/Family education, Joint manipulation, Joint mobilization, Stair training, Aquatic Therapy, Dry Needling, Electrical stimulation, Spinal manipulation, Spinal mobilization, Cryotherapy, Moist heat, scar mobilization, Taping, Traction, Ultrasound, Biofeedback, Ionotophoresis '4mg'$ /ml Dexamethasone, and Manual therapy.  PLAN FOR NEXT SESSION:  DC to HEP   2:27 PM, 04/30/22 Josue Hector PT DPT  Physical Therapist with [facility]  [phone]

## 2022-05-06 ENCOUNTER — Encounter: Payer: Self-pay | Admitting: Orthopedic Surgery

## 2022-05-06 ENCOUNTER — Ambulatory Visit (INDEPENDENT_AMBULATORY_CARE_PROVIDER_SITE_OTHER): Payer: Federal, State, Local not specified - PPO | Admitting: Orthopedic Surgery

## 2022-05-06 DIAGNOSIS — Z96652 Presence of left artificial knee joint: Secondary | ICD-10-CM

## 2022-05-06 NOTE — Patient Instructions (Signed)
LETTER TO EMPLOYER   RETURN TO WORK WITH RESTRICTIONS FOR NEXT 3 MONTHS: AVOID LADDERS AND STEPS

## 2022-05-06 NOTE — Progress Notes (Unsigned)
Chief Complaint  Patient presents with   Post-op Follow-up    Left knee replacement 03/10/22 improving / finished therapy last week     54-monthstatus post left total knee arthroplasty  The only issue right now the patient has is with climbing ladders and steps  Otherwise he is doing well walking without assistance no limp he is able to do some light chores and is well at home  He is ready to return to work  His incision looks good his knee comes to full extension knee stable in all planes flexion arc is 120 degrees  Recommend return to work on the 20th with 3 months restrictions for ladders and steps  Follow-up in 3 months

## 2022-05-21 ENCOUNTER — Encounter: Payer: Self-pay | Admitting: Radiology

## 2022-05-26 ENCOUNTER — Ambulatory Visit: Payer: Federal, State, Local not specified - PPO | Admitting: Family Medicine

## 2022-05-26 ENCOUNTER — Telehealth: Payer: Self-pay | Admitting: Orthopedic Surgery

## 2022-05-26 ENCOUNTER — Encounter: Payer: Self-pay | Admitting: Family Medicine

## 2022-05-26 VITALS — BP 126/86 | HR 76 | Ht 71.0 in | Wt 242.1 lb

## 2022-05-26 DIAGNOSIS — R399 Unspecified symptoms and signs involving the genitourinary system: Secondary | ICD-10-CM

## 2022-05-26 DIAGNOSIS — E559 Vitamin D deficiency, unspecified: Secondary | ICD-10-CM | POA: Diagnosis not present

## 2022-05-26 DIAGNOSIS — R7301 Impaired fasting glucose: Secondary | ICD-10-CM

## 2022-05-26 DIAGNOSIS — E039 Hypothyroidism, unspecified: Secondary | ICD-10-CM | POA: Diagnosis not present

## 2022-05-26 DIAGNOSIS — K580 Irritable bowel syndrome with diarrhea: Secondary | ICD-10-CM | POA: Diagnosis not present

## 2022-05-26 DIAGNOSIS — L03116 Cellulitis of left lower limb: Secondary | ICD-10-CM

## 2022-05-26 DIAGNOSIS — R7989 Other specified abnormal findings of blood chemistry: Secondary | ICD-10-CM

## 2022-05-26 DIAGNOSIS — R5383 Other fatigue: Secondary | ICD-10-CM

## 2022-05-26 DIAGNOSIS — E7849 Other hyperlipidemia: Secondary | ICD-10-CM | POA: Diagnosis not present

## 2022-05-26 DIAGNOSIS — D696 Thrombocytopenia, unspecified: Secondary | ICD-10-CM

## 2022-05-26 DIAGNOSIS — Z1159 Encounter for screening for other viral diseases: Secondary | ICD-10-CM

## 2022-05-26 DIAGNOSIS — Z114 Encounter for screening for human immunodeficiency virus [HIV]: Secondary | ICD-10-CM

## 2022-05-26 MED ORDER — LOPERAMIDE HCL 2 MG PO TABS: 2.0000 mg | ORAL_TABLET | Freq: Four times a day (QID) | ORAL | 0 refills | Status: DC | PRN

## 2022-05-26 MED ORDER — CEPHALEXIN 500 MG PO CAPS: 2000.0000 mg | ORAL_CAPSULE | Freq: Once | ORAL | 2 refills | Status: AC

## 2022-05-26 MED ORDER — CEPHALEXIN 500 MG PO CAPS: 500.0000 mg | ORAL_CAPSULE | Freq: Four times a day (QID) | ORAL | 0 refills | Status: DC

## 2022-05-26 NOTE — Progress Notes (Unsigned)
New Patient Office Visit  Subjective:  Patient ID: William Farley, male    DOB: May 03, 1960  Age: 62 y.o. MRN: [id number] mate CC:  Chief Complaint  Patient presents with   Establish Care    New patient, needs a pcp. Pt reports having a knee surgery 2 months ago, still in pain, reports being cold all the time. Pt reports apetite changes has been losing weight, also reports urinary concerns.     HPI DE William Farley is a 62 y.o. male with past medical history of right and left knee arthroplasty, GERD, insomnia, seizures and chronic pain syndrome presents for establishing care. For the details of today's visit, please refer to the assessment and plan.      Past Medical History:  Diagnosis Date   Anxiety    Arthritis    CAD (coronary artery disease)    Minimal Non-obstructive CAD seen on coronary CTA 04/2021   Depression    GERD (gastroesophageal reflux disease)    History of kidney stones    History of thrombocytopenia    Hx of syncope    PAF (paroxysmal atrial fibrillation) (HCC)    ChadsVasc Score is 0.  Monitor revealed less than 1% PAF burden.   Seizure ([place]) 02/2021   Sleep apnea    wears CPAP     Past Surgical History:  Procedure Laterality Date   APPENDECTOMY     BIOPSY  05/01/2021   Procedure: BIOPSY;  Surgeon: Rogene Houston, MD;  [facility];  Service: Endoscopy;;   CERVICAL SPINE SURGERY  2018   CHOLECYSTECTOMY     COLONOSCOPY WITH PROPOFOL N/A 05/01/2021   Procedure: COLONOSCOPY WITH PROPOFOL;  Surgeon: Rogene Houston, MD;  [facility];  Service: Endoscopy;  Laterality: N/A;  1050   CYST EXCISION     CYSTOSCOPY     CYSTOSCOPY WITH RETROGRADE PYELOGRAM, URETEROSCOPY AND STENT PLACEMENT Right 08/08/2020   Procedure: CYSTOSCOPY WITH RIGHT RETROGRADE PYELOGRAM AND RIGHT URETEROSCOPY;  Surgeon: Cleon Gustin, MD;  Location: [facility];  Service: Urology;  Laterality: Right;   EXTRACORPOREAL SHOCK WAVE LITHOTRIPSY Right 05/21/2020    Procedure: EXTRACORPOREAL SHOCK WAVE LITHOTRIPSY (ESWL);  Surgeon: Cleon Gustin, MD;  Location: [facility];  Service: Urology;  Laterality: Right;   GASTRIC BYPASS OPEN  2003   INCISIONAL HERNIA REPAIR     kidney stone removal     KNEE ARTHROSCOPY Left    multiple   NECK SURGERY     POLYPECTOMY  05/01/2021   Procedure: POLYPECTOMY;  Surgeon: Rogene Houston, MD;  [facility];  Service: Endoscopy;;   REPLACEMENT TOTAL KNEE Right    RHINOPLASTY     STONE EXTRACTION WITH BASKET Right 08/08/2020   Procedure: STONE EXTRACTION WITH BASKET;  Surgeon: Cleon Gustin, MD;  Location: [facility];  Service: Urology;  Laterality: Right;   TOTAL KNEE ARTHROPLASTY Left 03/10/2022   Procedure: TOTAL KNEE ARTHROPLASTY;  Surgeon: Carole Civil, MD;  Location: [facility];  Service: Orthopedics;  Laterality: Left;    Family History  Problem Relation Age of Onset   Heart disease Father 12   Kidney failure Father    Colon cancer Neg Hx     Social History   Socioeconomic History   Marital status: Married    Spouse name: Not on file   Number of children: Not on file   Years of education: Not on file   Highest education level: Not on file  Occupational  History   Occupation: Environmental manager  Tobacco Use   Smoking status: Former    Packs/day: 0.50    Years: 10.00    Total pack years: 5.00    Types: Cigarettes    Quit date: 05/20/1996    Years since quitting: 26.0   Smokeless tobacco: Never  Vaping Use   Vaping Use: Never used  Substance and Sexual Activity   Alcohol use: Not Currently   Drug use: Yes    Types: Marijuana    Comment: 1 or 2 x weeks   Sexual activity: Yes  Other Topics Concern   Not on file  Social History Narrative   Right handed    Married wife   One story home   Social Determinants of Health   Financial Resource Strain: Not on file  Food Insecurity: No Food Insecurity (03/10/2022)   Hunger Vital Sign    Worried About Running Out of Food in  the Last Year: Never true    Ran Out of Food in the Last Year: Never true  Transportation Needs: No Transportation Needs (03/10/2022)   PRAPARE - Hydrologist (Medical): No    Lack of Transportation (Non-Medical): No  Physical Activity: Not on file  Stress: Not on file  Social Connections: Not on file  Intimate Partner Violence: Not At Risk (03/10/2022)   Humiliation, Afraid, Rape, and Kick questionnaire    Fear of Current or Ex-Partner: No    Emotionally Abused: No    Physically Abused: No    Sexually Abused: No    ROS Review of Systems  Constitutional:  Positive for fatigue. Negative for fever.  Eyes:  Negative for visual disturbance.  Respiratory:  Negative for chest tightness and shortness of breath.   Cardiovascular:  Negative for chest pain and palpitations.  Musculoskeletal:  Positive for arthralgias.  Neurological:  Negative for dizziness and headaches.    Objective:   Today's Vitals: BP 126/86 (BP Location: Left Arm)   Pulse 76   Ht '5\' 11"'$  (1.803 m)   Wt 242 lb 1.9 oz (109.8 kg)   SpO2 96%   BMI 33.77 kg/m   Physical Exam HENT:     Head: Normocephalic.     Right Ear: External ear normal.     Left Ear: External ear normal.     Nose: No congestion or rhinorrhea.     Mouth/Throat:     Mouth: Mucous membranes are moist.  Cardiovascular:     Rate and Rhythm: Regular rhythm.     Heart sounds: No murmur heard. Pulmonary:     Effort: No respiratory distress.     Breath sounds: Normal breath sounds.  Musculoskeletal:     Right knee: No swelling, erythema or ecchymosis. No tenderness.     Left knee: Swelling, erythema and ecchymosis present. Tenderness present.  Skin:    Comments: ecchymosis on the inner thigh  Neurological:     Mental Status: He is alert.      Assessment & Plan:   Irritable bowel syndrome with diarrhea Assessment & Plan: Chronic condition Reports having 5-6 loose stools daily No pus or mucus in the  stool He reports sometimes having blood in his stools but notes having internal hemorrhoids No systemic symptoms reported Encouraged to take Imodium as needed for diarrhea He complains of right lower quadrant abdominal pain of unknown etiology Of note, he has a history of cholecystectomy He has had unremarkable imaging study of his right lower quadrant He  reports receiving a nerve block in the right lower quadrant for pain relief Pain is excruciating and debilitating placing him in a fetal position Duration of symptoms 3-5 minutes Pain is rated 10 out of 10 during acute episodes No pain reported today We will place a referral into GI for further evaluation   Orders: -     Ambulatory referral to Gastroenterology -     Loperamide HCl; Take 1 tablet (2 mg total) by mouth 4 (four) times daily as needed for diarrhea or loose stools.  Dispense: 30 tablet; Refill: 0  Lower urinary tract symptoms (LUTS) Assessment & Plan: Reports following up with the urology, Dr. Saralyn Pilar  He reports weak urinary stream with nighttime frequency No fever or chills reported Encouraged to continue following up with his urologist     Thrombocytopenia [facility]) Assessment & Plan: Complains of bruising on his lower extremity Will assess his platelet level today   Other fatigue Assessment & Plan: History of depression since 2016 Complains of fatigue and decreased desire for food Appetite is intact No systemic symptoms reported He does have a history of low Testerone  Will assess his vitamin D, thyroid levels, hemoglobin levels, and testosterone levels today    Cellulitis of left knee Assessment & Plan: Status post left knee arthroplasty The left knee is warm with palpation, with redness, swelling and tenderness noted Reports increased pain in the left knee Will treat prophylactically with Keflex  500 mg to take 4 times daily for 10 days Encouraged to take over-the-counter extra strength Tylenol and  heat application at the affected site   Orders: -     Cephalexin; Take 1 capsule (500 mg total) by mouth 4 (four) times daily for 7 days.  Dispense: 28 capsule; Refill: 0  IFG (impaired fasting glucose) -     Hemoglobin A1c  Vitamin D deficiency -     VITAMIN D 25 Hydroxy (Vit-D Deficiency, Fractures)  Hypothyroidism (acquired) -     TSH + free T4  Other hyperlipidemia -     Lipid panel -     CMP14+EGFR -     CBC with Differential/Platelet  Encounter for screening for HIV -     HIV Antibody (routine testing w rflx)  Need for hepatitis C screening test -     Hepatitis C antibody  Low serum testosterone level -     Testosterone,Free and Total     Follow-up: Return in about 3 months (around 08/26/2022).   Alvira Monday, FNP

## 2022-05-26 NOTE — Telephone Encounter (Signed)
Left message, asked him to call me he is already on Keflex I sent in a Rx for him to take before dental work per protocol but his PCP currently has him on an antibiotic (Keflex) he likely will need to be done with that and  any infection cleared before he proceeds with dental work

## 2022-05-26 NOTE — Telephone Encounter (Signed)
Patient presented to the office requesting a letter clearing him to have dental work.  He will be having that done next week.  Pt's # [phone]

## 2022-05-26 NOTE — Telephone Encounter (Signed)
The patient called back, stated he probably missed your appointments because he had a couple other appointments.  I explained what you noted in his chart, but he needs you to call him tomorrow.  [phone]

## 2022-05-26 NOTE — Patient Instructions (Addendum)
I appreciate the opportunity to provide care to you today!    Follow up:  3 months  Labs: please stop by the lab today to get your blood drawn (CBC, CMP, TSH, Lipid profile, HgA1c, Vit D)  Screening: HIV, Hep C, testosterone levels  Please pick up your antibiotic at the pharmacy and complete your prescription    Referrals today- Urology and GI   Please continue to a heart-healthy diet and increase your physical activities. Try to exercise for 81mns at least five times a week.      It was a pleasure to see you and I look forward to continuing to work together on your health and well-being. Please do not hesitate to call the office if you need care or have questions about your care.   Have a wonderful day and week. With Gratitude, GAlvira MondayMSN, FNP-BC

## 2022-05-27 LAB — LIPID PANEL
Chol/HDL Ratio: 3 ratio (ref 0.0–5.0)
Cholesterol, Total: 152 mg/dL (ref 100–199)
HDL: 51 mg/dL (ref >39)
LDL Chol Calc (NIH): 86 mg/dL (ref 0–99)
Triglycerides: 79 mg/dL (ref 0–149)
VLDL Cholesterol Cal: 15 mg/dL (ref 5–40)

## 2022-05-27 LAB — CBC WITH DIFFERENTIAL/PLATELET
Basophils Absolute: 0 10*3/uL (ref 0.0–0.2)
Basos: 1 %
EOS (ABSOLUTE): 0.1 10*3/uL (ref 0.0–0.4)
Eos: 3 %
Hematocrit: 42.8 % (ref 37.5–51.0)
Hemoglobin: 14.6 g/dL (ref 13.0–17.7)
Immature Grans (Abs): 0 10*3/uL (ref 0.0–0.1)
Immature Granulocytes: 0 %
Lymphocytes Absolute: 0.8 10*3/uL (ref 0.7–3.1)
Lymphs: 15 %
MCH: 29.8 pg (ref 26.6–33.0)
MCHC: 34.1 g/dL (ref 31.5–35.7)
MCV: 87 fL (ref 79–97)
Monocytes Absolute: 0.6 10*3/uL (ref 0.1–0.9)
Monocytes: 11 %
Neutrophils Absolute: 3.8 10*3/uL (ref 1.4–7.0)
Neutrophils: 70 %
Platelets: 182 10*3/uL (ref 150–450)
RBC: 4.9 x10E6/uL (ref 4.14–5.80)
RDW: 12.7 % (ref 11.6–15.4)
WBC: 5.3 10*3/uL (ref 3.4–10.8)

## 2022-05-27 LAB — CMP14+EGFR
ALT: 13 IU/L (ref 0–44)
AST: 15 IU/L (ref 0–40)
Albumin/Globulin Ratio: 2 (ref 1.2–2.2)
Albumin: 4.5 g/dL (ref 3.8–4.9)
Alkaline Phosphatase: 104 IU/L (ref 44–121)
BUN/Creatinine Ratio: 13 (ref 9–20)
BUN: 12 mg/dL (ref 6–24)
Bilirubin Total: 0.7 mg/dL (ref 0.0–1.2)
CO2: 20 mmol/L (ref 20–29)
Calcium: 9.3 mg/dL (ref 8.7–10.2)
Chloride: 106 mmol/L (ref 96–106)
Creatinine, Ser: 0.92 mg/dL (ref 0.76–1.27)
Globulin, Total: 2.3 g/dL (ref 1.5–4.5)
Glucose: 108 mg/dL — ABNORMAL HIGH (ref 70–99)
Potassium: 4.2 mmol/L (ref 3.5–5.2)
Sodium: 143 mmol/L (ref 134–144)
Total Protein: 6.8 g/dL (ref 6.0–8.5)
eGFR: 96 mL/min/{1.73_m2} (ref >59)

## 2022-05-27 LAB — TSH+FREE T4
Free T4: 1.09 ng/dL (ref 0.82–1.77)
TSH: 2.11 u[IU]/mL (ref 0.450–4.500)

## 2022-05-27 LAB — HEMOGLOBIN A1C
Est. average glucose Bld gHb Est-mCnc: 100 mg/dL
Hgb A1c MFr Bld: 5.1 % (ref 4.8–5.6)

## 2022-05-27 LAB — HEPATITIS C ANTIBODY: Hep C Virus Ab: NONREACTIVE

## 2022-05-27 LAB — HIV ANTIBODY (ROUTINE TESTING W REFLEX): HIV Screen 4th Generation wRfx: NONREACTIVE

## 2022-05-27 LAB — VITAMIN D 25 HYDROXY (VIT D DEFICIENCY, FRACTURES): Vit D, 25-Hydroxy: 27 ng/mL — ABNORMAL LOW (ref 30.0–100.0)

## 2022-05-27 NOTE — Telephone Encounter (Signed)
He has Rx for Keflex on file due to Warmth in the knee that his primary care noted on exam He said he is not taking it yet I told him he needs antibiotics for any dental work Dentist needs note ok for dental work I told him he may need to be complete with the antibiotics and well first  before the dental work  Please review and advise.

## 2022-05-28 DIAGNOSIS — L03116 Cellulitis of left lower limb: Secondary | ICD-10-CM | POA: Insufficient documentation

## 2022-05-28 DIAGNOSIS — R5383 Other fatigue: Secondary | ICD-10-CM | POA: Insufficient documentation

## 2022-05-28 DIAGNOSIS — R399 Unspecified symptoms and signs involving the genitourinary system: Secondary | ICD-10-CM | POA: Insufficient documentation

## 2022-05-28 DIAGNOSIS — K580 Irritable bowel syndrome with diarrhea: Secondary | ICD-10-CM | POA: Insufficient documentation

## 2022-05-28 NOTE — Assessment & Plan Note (Addendum)
Chronic condition Reports having 5-6 loose stools daily No pus or mucus in the stool He reports sometimes having blood in his stools but notes having internal hemorrhoids No systemic symptoms reported Encouraged to take Imodium as needed for diarrhea He complains of right lower quadrant abdominal pain of unknown etiology Of note, he has a history of cholecystectomy He has had unremarkable imaging study of his right lower quadrant He reports receiving a nerve block in the right lower quadrant for pain relief Pain is excruciating and debilitating placing him in a fetal position Duration of symptoms 3-5 minutes Pain is rated 10 out of 10 during acute episodes No pain reported today We will place a referral into GI for further evaluation

## 2022-05-28 NOTE — Assessment & Plan Note (Addendum)
Status post left knee arthroplasty The left knee is warm with palpation, with redness, swelling and tenderness noted Reports increased pain in the left knee Will treat prophylactically with Keflex  500 mg to take 4 times daily for 10 days Encouraged to take over-the-counter extra strength Tylenol and heat application at the affected site

## 2022-05-28 NOTE — Assessment & Plan Note (Addendum)
Reports following up with the urology, Dr. Saralyn Pilar  He reports weak urinary stream with nighttime frequency No fever or chills reported Encouraged to continue following up with his urologist

## 2022-05-28 NOTE — Assessment & Plan Note (Addendum)
History of depression since 2016 Complains of fatigue and decreased desire for food Appetite is intact No systemic symptoms reported He does have a history of low Testerone  Will assess his vitamin D, thyroid levels, hemoglobin levels, and testosterone levels today

## 2022-05-28 NOTE — Assessment & Plan Note (Signed)
Complains of bruising on his lower extremity Will assess his platelet level today

## 2022-05-29 IMAGING — DX DG ABDOMEN 1V
1 series · 2 of 2 positions shown · non-contrast
Comparison: Abdominal radiographs, 05/20/2020

CLINICAL DATA: Pre-procedure, nephrolithiasis

EXAM:
ABDOMEN - 1 VIEW

[Series 1: abdomen kub · 0.13mm/px · 2 of 2 slices shown]
[im 1/2]
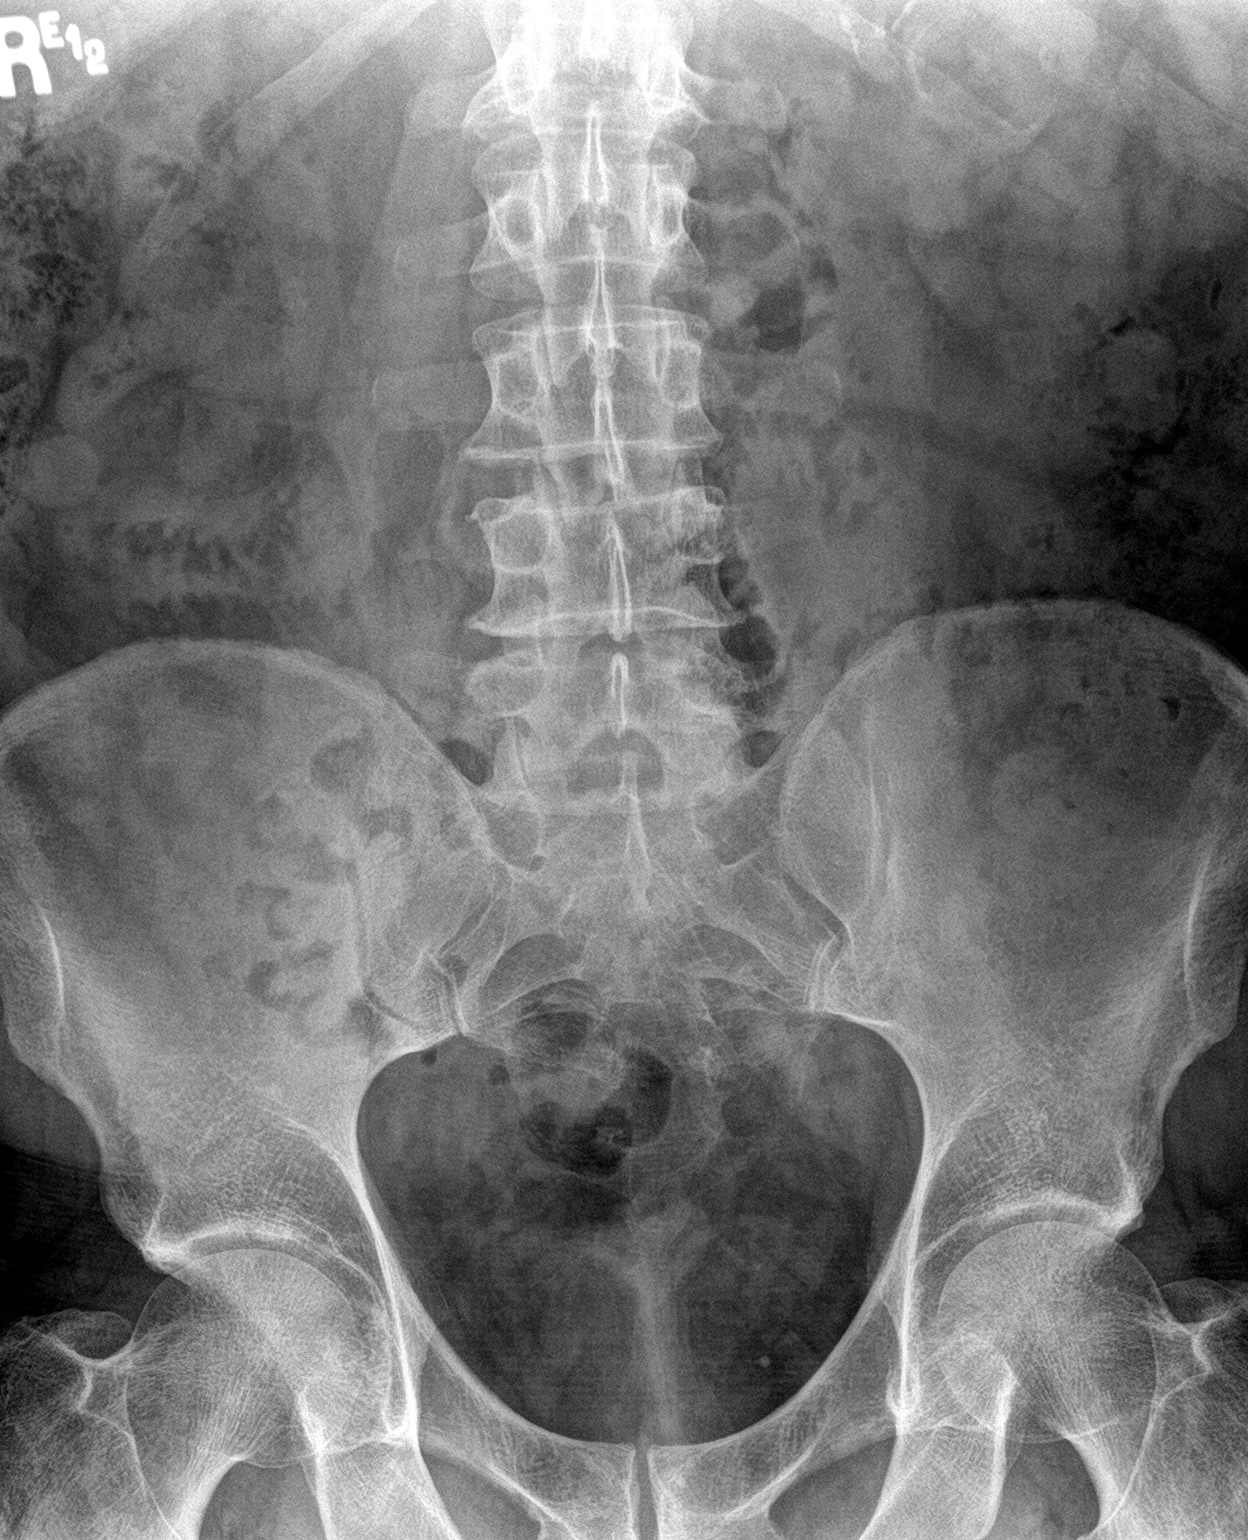
[im 2/2]
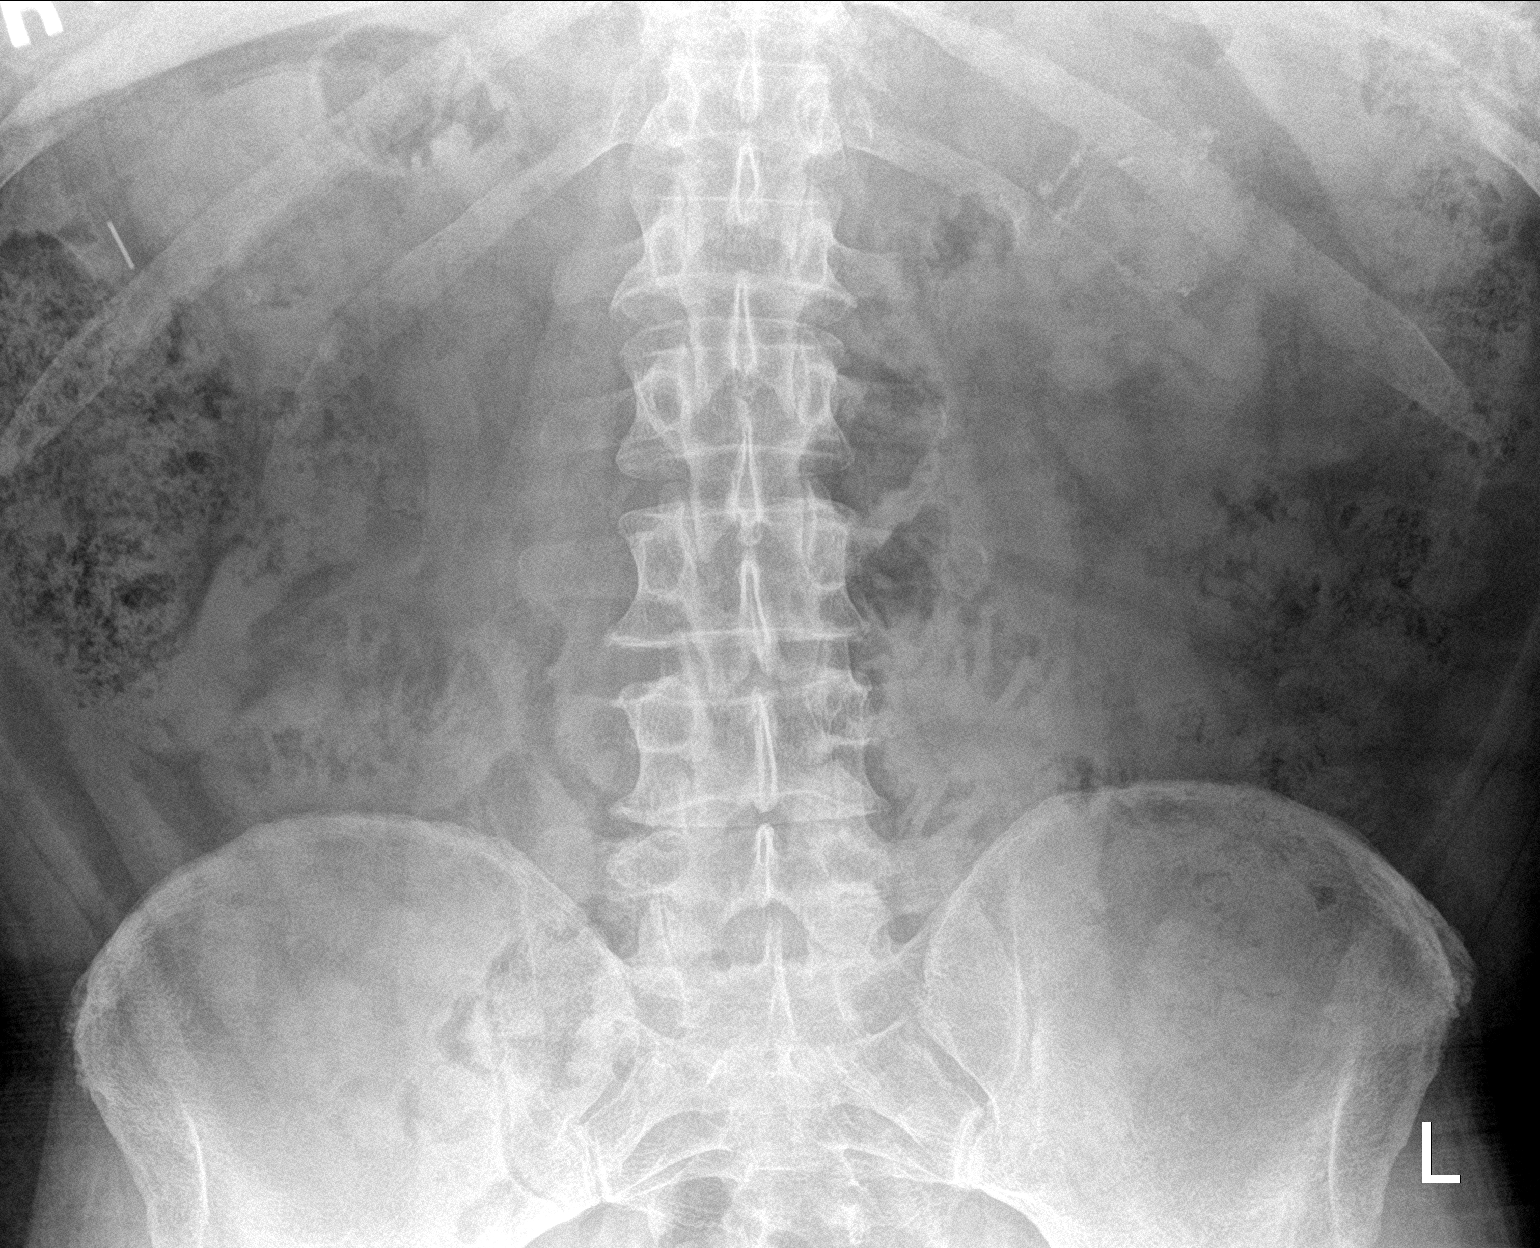

[2 of 2 positions shown; findings below may reference images not displayed]

FINDINGS: The bowel gas pattern is normal. Bowel suture in the upper abdomen
in keeping with gastric bypass. Small nonobstructive calculus
projecting over the inferior pole of the right kidney is unchanged.
IMPRESSION: Small nonobstructive calculus projecting over the inferior pole of
the right kidney is unchanged.

## 2022-06-02 ENCOUNTER — Encounter: Payer: Self-pay | Admitting: Orthopedic Surgery

## 2022-06-02 ENCOUNTER — Ambulatory Visit (INDEPENDENT_AMBULATORY_CARE_PROVIDER_SITE_OTHER): Payer: Federal, State, Local not specified - PPO | Admitting: Orthopedic Surgery

## 2022-06-02 DIAGNOSIS — L03116 Cellulitis of left lower limb: Secondary | ICD-10-CM

## 2022-06-02 MED ORDER — CEPHALEXIN 500 MG PO CAPS: 500.0000 mg | ORAL_CAPSULE | Freq: Four times a day (QID) | ORAL | 0 refills | Status: AC

## 2022-06-02 NOTE — Progress Notes (Signed)
Chief Complaint  Patient presents with   Post-op Follow-up    TKR 03/10/22 states needs dental work clearance, went to PCP who put him on Keflex due to warmth of knee    Mr. William Farley was brought in because his primary care doctor put him on Keflex for warmth in his knee  He is also scheduled to have a dental procedure which is her orthodontist or oral surgeon wants clearance from me to do  His knee looks great there is no signs of infection  I told him to go back on his Keflex for the pending or procedure and then take to 2000 mg 1 hour before  He will keep his regular appointment  Meds ordered this encounter  Medications   cephALEXin (KEFLEX) 500 MG capsule    Sig: Take 1 capsule (500 mg total) by mouth 4 (four) times daily for 7 days. On the day of the dental procedure take 4 tablets 1 hour before dental procedure    Dispense:  28 capsule    Refill:  0

## 2022-06-02 NOTE — Patient Instructions (Signed)
To the dentist or oral surgeon  Mr. William Farley is cleared for dental/oral surgery  He is on 500 mg of Keflex times a day and 2000 mg 1 hour before the dental procedure

## 2022-06-08 DIAGNOSIS — K029 Dental caries, unspecified: Secondary | ICD-10-CM | POA: Diagnosis not present

## 2022-06-10 ENCOUNTER — Encounter: Payer: Self-pay | Admitting: Internal Medicine

## 2022-06-10 ENCOUNTER — Ambulatory Visit (INDEPENDENT_AMBULATORY_CARE_PROVIDER_SITE_OTHER): Payer: Federal, State, Local not specified - PPO | Admitting: Internal Medicine

## 2022-06-10 VITALS — BP 148/89 | HR 72 | Temp 97.9°F | Ht 71.0 in | Wt 246.9 lb

## 2022-06-10 DIAGNOSIS — R1032 Left lower quadrant pain: Secondary | ICD-10-CM | POA: Diagnosis not present

## 2022-06-10 DIAGNOSIS — K219 Gastro-esophageal reflux disease without esophagitis: Secondary | ICD-10-CM

## 2022-06-10 DIAGNOSIS — R197 Diarrhea, unspecified: Secondary | ICD-10-CM

## 2022-06-10 MED ORDER — DICYCLOMINE HCL 10 MG PO CAPS: 10.0000 mg | ORAL_CAPSULE | Freq: Three times a day (TID) | ORAL | 11 refills | Status: DC

## 2022-06-10 NOTE — Patient Instructions (Signed)
Continue on omeprazole daily for your chronic reflux.  You likely have a component of irritable bowel syndrome with diarrhea predominance.  I am going to start you on a new medication called dicyclomine 10 mg which you can take up to 4 times a day.  I would start it twice daily and see how you do and increase or decrease as needed.  You will be due for surveillance colonoscopy 2030.  Follow-up in 3 to 4 months.  It was very nice meeting you today.  Thank you for your service to our country.  Dr. Abbey Chatters

## 2022-06-10 NOTE — Progress Notes (Signed)
Primary Care Physician:  Alvira Monday, [place] Primary Gastroenterologist:  Dr. Abbey Chatters  Chief Complaint  Patient presents with   Gastroesophageal Reflux    Patient reports he was referred for GERD. Last EGD at [place] in [place].when asked about IBS he said he has pain on right side that he has had for 40 - 50 years.     HPI:   William Farley is a 62 y.o. male who presents to the clinic as a new patient.   History of chronic GERD well-controlled on omeprazole 20 mg daily.  No dysphagia odynophagia.  No epigastric or chest pain.  States he had an EGD many years ago.  Unsure of results.  Last colonoscopy 05/01/2021 with a few small polyps removed, 1 tubular adenoma.  Recommended 7-year recall.  No family history of colorectal malignancy.  No melena hematochezia.  Does have issues with chronically loose stools up to 4-5 bowel movements daily.  Symptoms moderate, constant.  Some associated abdominal pain/cramping.  States he has been dealing with this for nearly 40 years.  Takes Imodium which does not help.  Status postcholecystectomy years ago.  Past Medical History:  Diagnosis Date   Anxiety    Arthritis    CAD (coronary artery disease)    Minimal Non-obstructive CAD seen on coronary CTA 04/2021   Depression    GERD (gastroesophageal reflux disease)    History of kidney stones    History of thrombocytopenia    Hx of syncope    PAF (paroxysmal atrial fibrillation) (HCC)    ChadsVasc Score is 0.  Monitor revealed less than 1% PAF burden.   Seizure ([place]) 02/2021   Sleep apnea    wears CPAP     Past Surgical History:  Procedure Laterality Date   APPENDECTOMY     BIOPSY  05/01/2021   Procedure: BIOPSY;  Surgeon: Rogene Houston, MD;  [facility];  Service: Endoscopy;;   CERVICAL SPINE SURGERY  2018   CHOLECYSTECTOMY     COLONOSCOPY WITH PROPOFOL N/A 05/01/2021   Procedure: COLONOSCOPY WITH PROPOFOL;  Surgeon: Rogene Houston, MD;  [facility];  Service:  Endoscopy;  Laterality: N/A;  1050   CYST EXCISION     CYSTOSCOPY     CYSTOSCOPY WITH RETROGRADE PYELOGRAM, URETEROSCOPY AND STENT PLACEMENT Right 08/08/2020   Procedure: CYSTOSCOPY WITH RIGHT RETROGRADE PYELOGRAM AND RIGHT URETEROSCOPY;  Surgeon: Cleon Gustin, MD;  Location[facility];  Service: Urology;  Laterality: Right;   EXTRACORPOREAL SHOCK WAVE LITHOTRIPSY Right 05/21/2020   Procedure: EXTRACORPOREAL SHOCK WAVE LITHOTRIPSY (ESWL);  Surgeon: Cleon Gustin, MD;  Location: [facility];  Service: Urology;  Laterality: Right;   GASTRIC BYPASS OPEN  2003   INCISIONAL HERNIA REPAIR     kidney stone removal     KNEE ARTHROSCOPY Left    multiple   NECK SURGERY     POLYPECTOMY  05/01/2021   Procedure: POLYPECTOMY;  Surgeon: Rogene Houston, MD;  [facility];  Service: Endoscopy;;   REPLACEMENT TOTAL KNEE Right    RHINOPLASTY     STONE EXTRACTION WITH BASKET Right 08/08/2020   Procedure: STONE EXTRACTION WITH BASKET;  Surgeon: Cleon Gustin, MD;  Location: [facility];  Service: Urology;  Laterality: Right;   TOTAL KNEE ARTHROPLASTY Left 03/10/2022   Procedure: TOTAL KNEE ARTHROPLASTY;  Surgeon: Carole Civil, MD;  Location: [facility];  Service: Orthopedics;  Laterality: Left;    Current Outpatient Medications  Medication Sig Dispense  Refill   Cholecalciferol (VITAMIN D3 PO) Take 7,500 mcg by mouth daily.     Cyanocobalamin (VITAMIN B12 PO) Take 1 tablet by mouth every other day.     DULoxetine (CYMBALTA) 60 MG capsule Take 60 mg by mouth 2 (two) times daily.     ibuprofen (ADVIL) 800 MG tablet Take 1 tablet (800 mg total) by mouth every 8 (eight) hours as needed. 90 tablet 1   loperamide (IMODIUM A-D) 2 MG tablet Take 1 tablet (2 mg total) by mouth 4 (four) times daily as needed for diarrhea or loose stools. 30 tablet 0   LORazepam (ATIVAN) 1 MG tablet Take 1 mg by mouth 3 (three) times daily as needed for anxiety.     metoprolol succinate (TOPROL-XL) 25 MG 24 hr tablet  TAKE 1 TABLET(25 MG) BY MOUTH DAILY 90 tablet 3   NON FORMULARY Pt uses a cpap nightly     omeprazole (PRILOSEC) 20 MG capsule Take 20 mg by mouth in the morning.     zolpidem (AMBIEN) 10 MG tablet Take 10 mg by mouth at bedtime.     No current facility-administered medications for this visit.    Allergies as of 06/10/2022 - Review Complete 06/10/2022  Allergen Reaction Noted   Codeine Itching, Rash, and Other (See Comments) 03/11/2012    Family History  Problem Relation Age of Onset   Heart disease Father 41   Kidney failure Father    Colon cancer Neg Hx     Social History   Socioeconomic History   Marital status: Married    Spouse name: Not on file   Number of children: Not on file   Years of education: Not on file   Highest education level: Not on file  Occupational History   Occupation: Environmental manager  Tobacco Use   Smoking status: Former    Packs/day: 0.50    Years: 10.00    Additional pack years: 0.00    Total pack years: 5.00    Types: Cigarettes    Quit date: 05/20/1996    Years since quitting: 26.0    Passive exposure: Past   Smokeless tobacco: Never  Vaping Use   Vaping Use: Never used  Substance and Sexual Activity   Alcohol use: Not Currently   Drug use: Yes    Types: Marijuana    Comment: 1 or 2 x weeks   Sexual activity: Yes  Other Topics Concern   Not on file  Social History Narrative   Right handed    Married wife   One story home   Social Determinants of Health   Financial Resource Strain: Not on file  Food Insecurity: No Food Insecurity (03/10/2022)   Hunger Vital Sign    Worried About Running Out of Food in the Last Year: Never true    Ran Out of Food in the Last Year: Never true  Transportation Needs: No Transportation Needs (03/10/2022)   PRAPARE - Hydrologist (Medical): No    Lack of Transportation (Non-Medical): No  Physical Activity: Not on file  Stress: Not on file  Social Connections:  Not on file  Intimate Partner Violence: Not At Risk (03/10/2022)   Humiliation, Afraid, Rape, and Kick questionnaire    Fear of Current or Ex-Partner: No    Emotionally Abused: No    Physically Abused: No    Sexually Abused: No    Subjective: Review of Systems  Constitutional:  Negative for chills and fever.  HENT:  Negative for congestion and hearing loss.   Eyes:  Negative for blurred vision and double vision.  Respiratory:  Negative for cough and shortness of breath.   Cardiovascular:  Negative for chest pain and palpitations.  Gastrointestinal:  Positive for abdominal pain, diarrhea and heartburn. Negative for blood in stool, constipation, melena and vomiting.  Genitourinary:  Negative for dysuria and urgency.  Musculoskeletal:  Negative for joint pain and myalgias.  Skin:  Negative for itching and rash.  Neurological:  Negative for dizziness and headaches.  Psychiatric/Behavioral:  Negative for depression. The patient is not nervous/anxious.        Objective: BP (!) 148/89 (BP Location: Left Arm, Patient Position: Sitting, Cuff Size: Large)   Pulse 72   Temp 97.9 F (36.6 C) (Oral)   Ht 5\' 11"  (1.803 m)   Wt 246 lb 14.4 oz (112 kg)   BMI 34.44 kg/m  Physical Exam Constitutional:      Appearance: Normal appearance.  HENT:     Head: Normocephalic and atraumatic.  Eyes:     Extraocular Movements: Extraocular movements intact.     Conjunctiva/sclera: Conjunctivae normal.  Cardiovascular:     Rate and Rhythm: Normal rate and regular rhythm.  Pulmonary:     Effort: Pulmonary effort is normal.     Breath sounds: Normal breath sounds.  Abdominal:     General: Bowel sounds are normal.     Palpations: Abdomen is soft.  Musculoskeletal:        General: Normal range of motion.     Cervical back: Normal range of motion and neck supple.  Skin:    General: Skin is warm.  Neurological:     General: No focal deficit present.     Mental Status: He is alert and oriented to  person, place, and time.  Psychiatric:        Mood and Affect: Mood normal.        Behavior: Behavior normal.      Assessment: *Chronic GERD-well-controlled omeprazole daily *Abdominal pain *Chronic diarrhea  Plan: Chronic GERD well-controlled on omeprazole daily.  Will continue.  Chronic diarrhea and abdominal pain likely due to irritable bowel syndrome with diarrhea predominance.  Imodium has not helped.  Will trial on dicyclomine to take up to 4 times daily as needed.  Colonoscopy for surveillance purposes due to 2030  Follow-up in 3 to 4 months.    06/10/2022 11:45 AM   Disclaimer: This note was dictated with voice recognition software. Similar sounding words can inadvertently be transcribed and may not be corrected upon review.

## 2022-06-26 ENCOUNTER — Ambulatory Visit: Payer: No Typology Code available for payment source | Admitting: Urology

## 2022-07-08 ENCOUNTER — Other Ambulatory Visit: Payer: Self-pay

## 2022-07-08 ENCOUNTER — Ambulatory Visit (HOSPITAL_COMMUNITY)
Admission: RE | Admit: 2022-07-08 | Discharge: 2022-07-08 | Disposition: A | Discharge status: Home / Self Care | Payer: Federal, State, Local not specified - PPO | Source: Ambulatory Visit | Attending: Urology | Admitting: Urology

## 2022-07-08 DIAGNOSIS — N200 Calculus of kidney: Secondary | ICD-10-CM | POA: Diagnosis not present

## 2022-07-13 ENCOUNTER — Ambulatory Visit: Payer: No Typology Code available for payment source | Admitting: Urology

## 2022-07-15 ENCOUNTER — Ambulatory Visit: Payer: Federal, State, Local not specified - PPO | Admitting: Urology

## 2022-07-15 VITALS — BP 122/74 | HR 56

## 2022-07-15 DIAGNOSIS — N401 Enlarged prostate with lower urinary tract symptoms: Secondary | ICD-10-CM | POA: Diagnosis not present

## 2022-07-15 DIAGNOSIS — R3912 Poor urinary stream: Secondary | ICD-10-CM

## 2022-07-15 DIAGNOSIS — N200 Calculus of kidney: Secondary | ICD-10-CM

## 2022-07-15 DIAGNOSIS — N138 Other obstructive and reflux uropathy: Secondary | ICD-10-CM

## 2022-07-15 MED ORDER — TAMSULOSIN HCL 0.4 MG PO CAPS: 0.4000 mg | ORAL_CAPSULE | Freq: Every day | ORAL | 11 refills | Status: DC

## 2022-07-15 NOTE — Progress Notes (Signed)
07/15/2022 4:25 PM   [name] 04/07/1960 [id number]  Referring provider: Alden Hipp, NP [address] [place],  [place] [address]  Followup nephrolithiasis   HPI: [name] is a 62yo here for followup for nephrolithiasis. He has passed several stone in the past year. He has had multiple 24 hour workups which were normal per patient. No flank pain currently. IPSS 18 QOL 3 on no BPH therapy. He previously took flomax but does not recall his response to the medication.    PMH: Past Medical History:  Diagnosis Date   Anxiety    Arthritis    CAD (coronary artery disease)    Minimal Non-obstructive CAD seen on coronary CTA 04/2021   Depression    GERD (gastroesophageal reflux disease)    History of kidney stones    History of thrombocytopenia    Hx of syncope    PAF (paroxysmal atrial fibrillation) (HCC)    ChadsVasc Score is 0.  Monitor revealed less than 1% PAF burden.   Seizure (HCC) 02/2021   Sleep apnea    wears CPAP     Surgical History: Past Surgical History:  Procedure Laterality Date   APPENDECTOMY     BIOPSY  05/01/2021   Procedure: BIOPSY;  Surgeon: Malissa Hippo, MD;  [facility];  Service: Endoscopy;;   CERVICAL SPINE SURGERY  2018   CHOLECYSTECTOMY     COLONOSCOPY WITH PROPOFOL N/A 05/01/2021   Procedure: COLONOSCOPY WITH PROPOFOL;  Surgeon: Malissa Hippo, MD;  [facility];  Service: Endoscopy;  Laterality: N/A;  1050   CYST EXCISION     CYSTOSCOPY     CYSTOSCOPY WITH RETROGRADE PYELOGRAM, URETEROSCOPY AND STENT PLACEMENT Right 08/08/2020   Procedure: CYSTOSCOPY WITH RIGHT RETROGRADE PYELOGRAM AND RIGHT URETEROSCOPY;  Surgeon: Malen Gauze, MD;  Location: [facility];  Service: Urology;  Laterality: Right;   EXTRACORPOREAL SHOCK WAVE LITHOTRIPSY Right 05/21/2020   Procedure: EXTRACORPOREAL SHOCK WAVE LITHOTRIPSY (ESWL);  Surgeon: Malen Gauze, MD;  Location: [facility];  Service: Urology;  Laterality: Right;   GASTRIC  BYPASS OPEN  2003   INCISIONAL HERNIA REPAIR     kidney stone removal     KNEE ARTHROSCOPY Left    multiple   NECK SURGERY     POLYPECTOMY  05/01/2021   Procedure: POLYPECTOMY;  Surgeon: Malissa Hippo, MD;  [facility];  Service: Endoscopy;;   REPLACEMENT TOTAL KNEE Right    RHINOPLASTY     STONE EXTRACTION WITH BASKET Right 08/08/2020   Procedure: STONE EXTRACTION WITH BASKET;  Surgeon: Malen Gauze, MD;  [facility]: [facility];  Service: Urology;  Laterality: Right;   TOTAL KNEE ARTHROPLASTY Left 03/10/2022   Procedure: TOTAL KNEE ARTHROPLASTY;  Surgeon: Vickki Hearing, MD;  Location: [facility];  Service: Orthopedics;  Laterality: Left;    Home Medications:  Allergies as of 07/15/2022       Reactions   Codeine Itching, Rash, Other (See Comments)        Medication List        Accurate as of July 15, 2022  4:25 PM. If you have any questions, ask your nurse or doctor.          dicyclomine 10 MG capsule Commonly known as: BENTYL Take 1 capsule (10 mg total) by mouth 4 (four) times daily -  before meals and at bedtime.   DULoxetine 60 MG capsule Commonly known as: CYMBALTA Take 60 mg by mouth 2 (two) times  daily.   ibuprofen 800 MG tablet Commonly known as: ADVIL Take 1 tablet (800 mg total) by mouth every 8 (eight) hours as needed.   loperamide 2 MG tablet Commonly known as: IMODIUM A-D Take 1 tablet (2 mg total) by mouth 4 (four) times daily as needed for diarrhea or loose stools.   LORazepam 1 MG tablet Commonly known as: ATIVAN Take 1 mg by mouth 3 (three) times daily as needed for anxiety.   metoprolol succinate 25 MG 24 hr tablet Commonly known as: TOPROL-XL TAKE 1 TABLET(25 MG) BY MOUTH DAILY   NON FORMULARY Pt uses a cpap nightly   omeprazole 20 MG capsule Commonly known as: PRILOSEC Take 20 mg by mouth in the morning.   VITAMIN B12 PO Take 1 tablet by mouth every other day.   VITAMIN D3 PO Take 7,500 mcg by mouth daily.    zolpidem 10 MG tablet Commonly known as: AMBIEN Take 10 mg by mouth at bedtime.        Allergies:  Allergies  Allergen Reactions   Codeine Itching, Rash and Other (See Comments)    Family History: Family History  Problem Relation Age of Onset   Heart disease Father 44   Kidney failure Father    Colon cancer Neg Hx     Social History:  reports that he quit smoking about 26 years ago. His smoking use included cigarettes. He has a 5.00 pack-year smoking history. He has been exposed to tobacco smoke. He has never used smokeless tobacco. He reports that he does not currently use alcohol. He reports current drug use. Drug: Marijuana.  ROS: All other review of systems were reviewed and are negative except what is noted above in HPI  Physical Exam: BP 122/74   Pulse (!) 56   Constitutional:  Alert and oriented, No acute distress. HEENT: [place] AT, moist mucus membranes.  Trachea midline, no masses. Cardiovascular: No clubbing, cyanosis, or edema. Respiratory: Normal respiratory effort, no increased work of breathing. GI: Abdomen is soft, nontender, nondistended, no abdominal masses GU: No CVA tenderness.  Lymph: No cervical or inguinal lymphadenopathy. Skin: No rashes, bruises or suspicious lesions. Neurologic: Grossly intact, no focal deficits, moving all 4 extremities. Psychiatric: Normal mood and affect.  Laboratory Data: Lab Results  Component Value Date   WBC 5.3 05/26/2022   HGB 14.6 05/26/2022   HCT 42.8 05/26/2022   MCV 87 05/26/2022   PLT 182 05/26/2022    Lab Results  Component Value Date   CREATININE 0.92 05/26/2022    No results found for: "PSA"  No results found for: "TESTOSTERONE"  Lab Results  Component Value Date   HGBA1C 5.1 05/26/2022    Urinalysis    Component Value Date/Time   APPEARANCEUR Clear 06/11/2021 1137   GLUCOSEU Negative 06/11/2021 1137   BILIRUBINUR Negative 06/11/2021 1137   PROTEINUR Negative 06/11/2021 1137   NITRITE  Negative 06/11/2021 1137   LEUKOCYTESUR Negative 06/11/2021 1137    Lab Results  Component Value Date   LABMICR Comment 06/11/2021   WBCUA None seen 06/26/2020   LABEPIT 0-10 06/26/2020   BACTERIA None seen 06/26/2020    Pertinent Imaging: KUB 07/08/2022: Images reviewed and discussed with the patient  Results for orders placed in visit on 07/08/22  DG Abd 1 View  Narrative CLINICAL DATA:  Kidney stones, lithotripsy RIGHT 6 months ago, has passed 5 stones since  EXAM: ABDOMEN - 1 VIEW  COMPARISON:  06/10/2021  FINDINGS: Tiny calculus inferior pole LEFT kidney.  Question 7 mm calculus upper pole LEFT kidney.  No additional urinary tract calcifications.  Bowel gas pattern normal.  Osseous structures unremarkable.  IMPRESSION: Potentially 2 LEFT renal calculi.   Electronically Signed By: Ulyses Southward M.D. On: 07/11/2022 11:44  No results found for this or any previous visit.  No results found for this or any previous visit.  No results found for this or any previous visit.  Results for orders placed during the hospital encounter of 10/11/20  Ultrasound renal complete  Narrative CLINICAL DATA:  History of nephrolithiasis.  EXAM: RENAL / URINARY TRACT ULTRASOUND COMPLETE  COMPARISON:  None.  FINDINGS: Right Kidney:  Renal measurements: 11.8 cm x 7.1 cm x 6.7 cm = volume: 293 mL. Echogenicity within normal limits. A 6 mm shadowing echogenic renal calculus is seen within the lower pole of the right kidney. No mass or hydronephrosis visualized.  Left Kidney:  Renal measurements: 13.8 cm x 5.7 cm x 4.8 cm = volume: 200 mL. Echogenicity within normal limits. No mass or hydronephrosis visualized.  Bladder:  Appears normal for degree of bladder distention.  Other:  None.  IMPRESSION: 6 mm nonobstructing renal stone within the right kidney.   Electronically Signed By: Aram Candela M.D. On: 10/12/2020 19:10  No valid procedures  specified. No results found for this or any previous visit.  Results for orders placed in visit on 11/19/20  CT RENAL STONE STUDY  Narrative CLINICAL DATA:  Two weeks of intermittent sharp severe nonradiating right flank pain.  EXAM: CT ABDOMEN AND PELVIS WITHOUT CONTRAST  TECHNIQUE: Multidetector CT imaging of the abdomen and pelvis was performed following the standard protocol without IV contrast.  COMPARISON:  CT February 29, 2020  FINDINGS: Lower chest: No acute abnormality.  Hepatobiliary: Unremarkable noncontrast appearance of the hepatic parenchyma. Gallbladder surgically absent. No biliary ductal dilation.  Pancreas: Within normal limits.  Spleen: Within normal limits.  Adrenals/Urinary Tract: Bilateral adrenal glands are unremarkable. No hydronephrosis. Left kidney is unremarkable. Punctate nonobstructive right lower pole renal stone. No obstructive ureteral or bladder calculus visualized. Urinary bladder is decompressed limiting evaluation.  Stomach/Bowel: Prior gastric bypass. No pathologic dilation of small or large bowel. Appendix is surgically absent.  Vascular/Lymphatic: Aortic atherosclerosis without abdominal aortic aneurysm. No pathologically enlarged abdominal or pelvic lymph nodes.  Reproductive: Prostate is unremarkable.  Other: Fat in bilateral inguinal canals.  No abdominopelvic ascites.  Musculoskeletal: Mild thoracolumbar spondylosis. No acute osseous abnormality.  IMPRESSION: 1. No acute findings in the abdomen or pelvis. 2. Punctate nonobstructive right lower pole renal stone. No obstructive ureteral or bladder calculus visualized. 3. Prior gastric bypass and cholecystectomy. 4.  Aortic Atherosclerosis (ICD10-I70.0).   Electronically Signed By: Maudry Mayhew M.D. On: 11/20/2020 11:11   Assessment & Plan:    1. Nephrolithiasis -we will proceed with metabolic evaluation - Urinalysis, Routine w reflex microscopic  2. Weak  urinary stream -flomax 0.4mg  daily   No follow-ups on file.  Wilkie Aye, MD  [facility] [place]

## 2022-07-16 DIAGNOSIS — N200 Calculus of kidney: Secondary | ICD-10-CM | POA: Diagnosis not present

## 2022-07-16 LAB — URINALYSIS, ROUTINE W REFLEX MICROSCOPIC
Bilirubin, UA: NEGATIVE
Glucose, UA: NEGATIVE
Ketones, UA: NEGATIVE
Leukocytes,UA: NEGATIVE
Nitrite, UA: NEGATIVE
Protein,UA: NEGATIVE
RBC, UA: NEGATIVE
Specific Gravity, UA: 1.025 (ref 1.005–1.030)
Urobilinogen, Ur: 0.2 mg/dL (ref 0.2–1.0)
pH, UA: 5 (ref 5.0–7.5)

## 2022-07-17 LAB — BASIC METABOLIC PANEL
BUN/Creatinine Ratio: 15 (ref 9–20)
BUN: 14 mg/dL (ref 6–24)
CO2: 21 mmol/L (ref 20–29)
Calcium: 9.2 mg/dL (ref 8.7–10.2)
Chloride: 104 mmol/L (ref 96–106)
Creatinine, Ser: 0.95 mg/dL (ref 0.76–1.27)
Glucose: 110 mg/dL — ABNORMAL HIGH (ref 70–99)
Potassium: 4.9 mmol/L (ref 3.5–5.2)
Sodium: 141 mmol/L (ref 134–144)
eGFR: 92 mL/min/{1.73_m2} (ref >59)

## 2022-07-17 LAB — URIC ACID: Uric Acid: 7.1 mg/dL (ref 3.8–8.4)

## 2022-07-17 LAB — PTH, INTACT AND CALCIUM: PTH: 30 pg/mL (ref 15–65)

## 2022-07-21 ENCOUNTER — Encounter: Payer: Self-pay | Admitting: Urology

## 2022-07-21 NOTE — Patient Instructions (Signed)

## 2022-07-22 LAB — CALCULI, WITH PHOTOGRAPH (CLINICAL LAB)
Calcium Oxalate Dihydrate: 20 %
Calcium Oxalate Monohydrate: 80 %
Weight Calculi: 32 mg

## 2022-07-27 ENCOUNTER — Other Ambulatory Visit: Payer: Self-pay | Admitting: Urology

## 2022-08-01 LAB — LITHOLINK 24HR URINE PANEL
Ammonium, Urine: 53 mmol/24 hr (ref 15–60)
Calcium Oxalate Saturation: 7.13 (ref 6.00–10.00)
Calcium Phosphate Saturation: 0.22 — ABNORMAL LOW (ref 0.50–2.00)
Calcium, Urine: 140 mg/24 hr (ref <250)
Calcium/Creatinine Ratio: 70 mg/g creat (ref 34–196)
Calcium/Kg Body Weight: 1.3 mg/24 hr/kg (ref <4.0)
Chloride, Urine: 220 mmol/24 hr (ref 70–250)
Citrate, Urine: 882 mg/24 hr (ref >450)
Creatinine, Urine: 2009 mg/24 hr
Creatinine/Kg Body Weight: 19.1 mg/24 hr/kg (ref 11.9–24.4)
Cystine, Urine, Qualitative: NEGATIVE
Magnesium, Urine: 126 mg/24 hr — ABNORMAL HIGH (ref 30–120)
Oxalate, Urine: 70 mg/24 hr — ABNORMAL HIGH (ref 20–40)
Phosphorus, Urine: 1696 mg/24 hr — ABNORMAL HIGH (ref 600–1200)
Potassium, Urine: 92 mmol/24 hr (ref 20–100)
Protein Catabolic Rate: 1 g/kg/24 hr (ref 0.8–1.4)
Sodium, Urine: 204 mmol/24 hr — ABNORMAL HIGH (ref 50–150)
Sulfate, Urine: 53 meq/24 hr (ref 20–80)
Urea Nitrogen, Urine: 13.71 g/24 hr (ref 6.00–14.00)
Uric Acid Saturation: 2.77 — ABNORMAL HIGH (ref <1.00)
Uric Acid, Urine: 867 mg/24 hr — ABNORMAL HIGH (ref <800)
Urine Volume (Preserved): 1870 mL/24 hr (ref 500–4000)
pH, 24 hr, Urine: 5.27 — ABNORMAL LOW (ref 5.800–6.200)

## 2022-08-05 ENCOUNTER — Ambulatory Visit: Payer: Federal, State, Local not specified - PPO | Admitting: Orthopedic Surgery

## 2022-08-10 ENCOUNTER — Ambulatory Visit: Payer: Federal, State, Local not specified - PPO | Admitting: Orthopedic Surgery

## 2022-08-10 ENCOUNTER — Encounter: Payer: Self-pay | Admitting: Orthopedic Surgery

## 2022-08-10 VITALS — BP 118/66 | HR 77 | Ht 71.0 in | Wt 243.0 lb

## 2022-08-10 DIAGNOSIS — Z96652 Presence of left artificial knee joint: Secondary | ICD-10-CM

## 2022-08-10 NOTE — Progress Notes (Signed)
Chief Complaint  Patient presents with   Knee Pain    L/ it is getting there. Pain level is 3   Date of surgery 03/10/2022  Patient is 5 months postop seems to be doing well pain level is a 3  Allah is retiring and 67 days.  In review  He had his right knee replaced with a robotic technique and his left knee was done by me with standard technique  He has some swelling and some discomfort after climbing the stairs  He has a little bit of clicking and popping when he bends and straightens the knee but otherwise he is doing very well he says he made a turn for the better couple days ago and feels like the numbness on the lateral side of the incision is no longer bothering him  He has full extension and full flexion of the knee  Will do an x-ray at his annual visit

## 2022-08-20 ENCOUNTER — Encounter: Payer: Self-pay | Admitting: Orthopedic Surgery

## 2022-09-01 ENCOUNTER — Encounter: Payer: Self-pay | Admitting: Family Medicine

## 2022-09-01 ENCOUNTER — Ambulatory Visit: Payer: Federal, State, Local not specified - PPO | Admitting: Family Medicine

## 2022-09-01 VITALS — BP 124/75 | HR 60 | Ht 71.0 in | Wt 243.1 lb

## 2022-09-01 DIAGNOSIS — E7849 Other hyperlipidemia: Secondary | ICD-10-CM | POA: Diagnosis not present

## 2022-09-01 DIAGNOSIS — R7301 Impaired fasting glucose: Secondary | ICD-10-CM | POA: Diagnosis not present

## 2022-09-01 DIAGNOSIS — E559 Vitamin D deficiency, unspecified: Secondary | ICD-10-CM

## 2022-09-01 DIAGNOSIS — Z96651 Presence of right artificial knee joint: Secondary | ICD-10-CM

## 2022-09-01 DIAGNOSIS — F411 Generalized anxiety disorder: Secondary | ICD-10-CM | POA: Diagnosis not present

## 2022-09-01 DIAGNOSIS — E0789 Other specified disorders of thyroid: Secondary | ICD-10-CM

## 2022-09-01 NOTE — Progress Notes (Signed)
Established Patient Office Visit  Subjective:  Patient ID: William Farley, male    DOB: 10-14-1960  Age: 62 y.o. MRN: [id number]  CC:  Chief Complaint  Patient presents with   Chronic Care Management    3 month f/u    HPI [name] is a 63 y.o. male with past medical history of PTSD, s/p total knee replacement, and irritable bowel syndrome with diarrhea presents for f/u of  chronic medical conditions. For the details of today's visit, please refer to the assessment and plan.     Past Medical History:  Diagnosis Date   Anxiety    Arthritis    CAD (coronary artery disease)    Minimal Non-obstructive CAD seen on coronary CTA 04/2021   Depression    GERD (gastroesophageal reflux disease)    History of kidney stones    History of thrombocytopenia    Hx of syncope    PAF (paroxysmal atrial fibrillation) (HCC)    ChadsVasc Score is 0.  Monitor revealed less than 1% PAF burden.   Seizure (HCC) 02/2021   Sleep apnea    wears CPAP     Past Surgical History:  Procedure Laterality Date   APPENDECTOMY     BIOPSY  05/01/2021   Procedure: BIOPSY;  Surgeon: Malissa Hippo, MD;  [facility];  Service: Endoscopy;;   CERVICAL SPINE SURGERY  2018   CHOLECYSTECTOMY     COLONOSCOPY WITH PROPOFOL N/A 05/01/2021   Procedure: COLONOSCOPY WITH PROPOFOL;  Surgeon: Malissa Hippo, MD;  [facility];  Service: Endoscopy;  Laterality: N/A;  1050   CYST EXCISION     CYSTOSCOPY     CYSTOSCOPY WITH RETROGRADE PYELOGRAM, URETEROSCOPY AND STENT PLACEMENT Right 08/08/2020   Procedure: CYSTOSCOPY WITH RIGHT RETROGRADE PYELOGRAM AND RIGHT URETEROSCOPY;  Surgeon: Malen Gauze, MD;  [facility];  Service: Urology;  Laterality: Right;   EXTRACORPOREAL SHOCK WAVE LITHOTRIPSY Right 05/21/2020   Procedure: EXTRACORPOREAL SHOCK WAVE LITHOTRIPSY (ESWL);  Surgeon: Malen Gauze, MD;  Location: [facility];  Service: Urology;  Laterality: Right;   GASTRIC BYPASS OPEN  2003    INCISIONAL HERNIA REPAIR     kidney stone removal     KNEE ARTHROSCOPY Left    multiple   NECK SURGERY     POLYPECTOMY  05/01/2021   Procedure: POLYPECTOMY;  Surgeon: Malissa Hippo, MD;  [facility];  Service: Endoscopy;;   REPLACEMENT TOTAL KNEE Right    RHINOPLASTY     STONE EXTRACTION WITH BASKET Right 08/08/2020   Procedure: STONE EXTRACTION WITH BASKET;  Surgeon: Malen Gauze, MD;  [facility];  Service: Urology;  Laterality: Right;   TOTAL KNEE ARTHROPLASTY Left 03/10/2022   Procedure: TOTAL KNEE ARTHROPLASTY;  Surgeon: Vickki Hearing, MD;  Location: [facility];  Service: Orthopedics;  Laterality: Left;    Family History  Problem Relation Age of Onset   Heart disease Father 42   Kidney failure Father    Colon cancer Neg Hx     Social History   Socioeconomic History   Marital status: Married    Spouse name: Not on file   Number of children: Not on file   Years of education: Not on file   Highest education level: Associate degree: occupational, Scientist, product/process development, or vocational program  Occupational History   Occupation: Economist  Tobacco Use   Smoking status: Former    Packs/day: 0.50    Years: 10.00  Additional pack years: 0.00    Total pack years: 5.00    Types: Cigarettes    Quit date: 05/20/1996    Years since quitting: 26.3    Passive exposure: Past   Smokeless tobacco: Never  Vaping Use   Vaping Use: Never used  Substance and Sexual Activity   Alcohol use: Not Currently   Drug use: Yes    Types: Marijuana    Comment: 1 or 2 x weeks   Sexual activity: Yes  Other Topics Concern   Not on file  Social History Narrative   Right handed    Married wife   One story home   Social Determinants of Health   Financial Resource Strain: Low Risk  (08/30/2022)   Overall Financial Resource Strain (CARDIA)    Difficulty of Paying Living Expenses: Not hard at all  Food Insecurity: No Food Insecurity (08/30/2022)   Hunger Vital  Sign    Worried About Running Out of Food in the Last Year: Never true    Ran Out of Food in the Last Year: Never true  Transportation Needs: No Transportation Needs (08/30/2022)   PRAPARE - Administrator, Civil Service (Medical): No    Lack of Transportation (Non-Medical): No  Physical Activity: Insufficiently Active (08/30/2022)   Exercise Vital Sign    Days of Exercise per Week: 3 days    Minutes of Exercise per Session: 10 min  Stress: Stress Concern Present (08/30/2022)   [employer] of Occupational Health - Occupational Stress Questionnaire    Feeling of Stress : Very much  Social Connections: Moderately Integrated (08/30/2022)   Social Connection and Isolation Panel [NHANES]    Frequency of Communication with Friends and Family: Twice a week    Frequency of Social Gatherings with Friends and Family: Three times a week    Attends Religious Services: Never    Active Member of Clubs or Organizations: Yes    Attends Banker Meetings: More than 4 times per year    Marital Status: Married  Catering manager Violence: Not At Risk (03/10/2022)   Humiliation, Afraid, Rape, and Kick questionnaire    Fear of Current or Ex-Partner: No    Emotionally Abused: No    Physically Abused: No    Sexually Abused: No    Outpatient Medications Prior to Visit  Medication Sig Dispense Refill   Cholecalciferol (VITAMIN D3 PO) Take 7,500 mcg by mouth daily.     Cyanocobalamin (VITAMIN B12 PO) Take 1 tablet by mouth every other day.     dicyclomine (BENTYL) 10 MG capsule Take 1 capsule (10 mg total) by mouth 4 (four) times daily -  before meals and at bedtime. 120 capsule 11   DULoxetine (CYMBALTA) 60 MG capsule Take 60 mg by mouth 2 (two) times daily.     ibuprofen (ADVIL) 800 MG tablet Take 1 tablet (800 mg total) by mouth every 8 (eight) hours as needed. 90 tablet 1   loperamide (IMODIUM A-D) 2 MG tablet Take 1 tablet (2 mg total) by mouth 4 (four) times daily as needed for  diarrhea or loose stools. 30 tablet 0   LORazepam (ATIVAN) 1 MG tablet Take 1 mg by mouth 3 (three) times daily as needed for anxiety.     metoprolol succinate (TOPROL-XL) 25 MG 24 hr tablet TAKE 1 TABLET(25 MG) BY MOUTH DAILY 90 tablet 3   NON FORMULARY Pt uses a cpap nightly     omeprazole (PRILOSEC) 20 MG capsule Take 20  mg by mouth in the morning.     tamsulosin (FLOMAX) 0.4 MG CAPS capsule Take 1 capsule (0.4 mg total) by mouth daily after supper. 30 capsule 11   zolpidem (AMBIEN) 10 MG tablet Take 10 mg by mouth at bedtime.     No facility-administered medications prior to visit.    Allergies  Allergen Reactions   Codeine Itching, Rash and Other (See Comments)    ROS Review of Systems  Constitutional:  Negative for fatigue and fever.  Eyes:  Negative for visual disturbance.  Respiratory:  Negative for chest tightness and shortness of breath.   Cardiovascular:  Negative for chest pain and palpitations.  Neurological:  Negative for dizziness and headaches.      Objective:    Physical Exam  BP 124/75   Pulse 60   Ht 5\' 11"  (1.803 m)   Wt 243 lb 1.9 oz (110.3 kg)   SpO2 96%   BMI 33.91 kg/m  Wt Readings from Last 3 Encounters:  09/01/22 243 lb 1.9 oz (110.3 kg)  08/10/22 243 lb (110.2 kg)  06/10/22 246 lb 14.4 oz (112 kg)    Lab Results  Component Value Date   TSH 2.110 05/26/2022   Lab Results  Component Value Date   WBC 5.3 05/26/2022   HGB 14.6 05/26/2022   HCT 42.8 05/26/2022   MCV 87 05/26/2022   PLT 182 05/26/2022   Lab Results  Component Value Date   NA 141 07/16/2022   K 4.9 07/16/2022   CO2 21 07/16/2022   GLUCOSE 110 (H) 07/16/2022   BUN 14 07/16/2022   CREATININE 0.95 07/16/2022   BILITOT 0.7 05/26/2022   ALKPHOS 104 05/26/2022   AST 15 05/26/2022   ALT 13 05/26/2022   PROT 6.8 05/26/2022   ALBUMIN 4.5 05/26/2022   CALCIUM 9.2 07/16/2022   ANIONGAP 6 03/11/2022   EGFR 92 07/16/2022   Lab Results  Component Value Date   CHOL 152  05/26/2022   Lab Results  Component Value Date   HDL 51 05/26/2022   Lab Results  Component Value Date   LDLCALC 86 05/26/2022   Lab Results  Component Value Date   TRIG 79 05/26/2022   Lab Results  Component Value Date   CHOLHDL 3.0 05/26/2022   Lab Results  Component Value Date   HGBA1C 5.1 05/26/2022      Assessment & Plan:  Generalized anxiety disorder Assessment & Plan: GAD-7 is a 20 Reports that his anxiety is linked to PTSD from being in the [employer] He wants to continue to follow-up with his psychiatrist on the [place] He currently takes Cymbalta 60 mg daily reports minimal relief He denies suicidal thoughts and ideation Will defer dose augmentation to his psychiatrist at this time Encouraged to continue to follow-up with his psychiatrist as scheduled     Status post total right knee replacement Assessment & Plan: He followed up with orthopedics surgery on 08/10/2022 No pain, swelling, no evidence of infection noted He had right knee replaced with robotic technique of his right knee completed by Dr. Romeo Apple and he is 5 months postop and reports doing well He has full extension and full flexion of the knee and will be getting imaging study at his next visit with Dr. Romeo Apple   Vitamin D deficiency -     VITAMIN D 25 Hydroxy (Vit-D Deficiency, Fractures)  Other hyperlipidemia -     CBC with Differential/Platelet -     CMP14+EGFR -  Lipid panel  Impaired fasting blood sugar -     Hemoglobin A1c  Other specified disorders of thyroid -     TSH    Follow-up: Return in about 3 months (around 12/02/2022).   Gilmore Laroche, FNP

## 2022-09-01 NOTE — Patient Instructions (Addendum)
I appreciate the opportunity to provide care to you today!    Follow up:  3 months  Labs: please stop by the lab today/during the week to get your blood drawn (CBC, CMP, TSH, Lipid profile, HgA1c, Vit D)    Please continue to a heart-healthy diet and increase your physical activities. Try to exercise for at least five days a week.      It was a pleasure to see you and I look forward to continuing to work together on your health and well-being. Please do not hesitate to call the office if you need care or have questions about your care.   Have a wonderful day and week. With Gratitude, Gilmore Laroche MSN, FNP-BC

## 2022-09-01 NOTE — Assessment & Plan Note (Addendum)
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

## 2022-09-01 NOTE — Assessment & Plan Note (Addendum)
He followed up with orthopedics surgery on 08/10/2022 No pain, swelling, no evidence of infection noted He had right knee replaced with robotic technique of his right knee completed by Dr. Romeo Apple and he is 5 months postop and reports doing well He has full extension and full flexion of the knee and will be getting imaging study at his next visit with Dr. Romeo Apple

## 2022-09-02 LAB — CMP14+EGFR
ALT: 16 IU/L (ref 0–44)
AST: 19 IU/L (ref 0–40)
Albumin/Globulin Ratio: 2
Albumin: 4.2 g/dL (ref 3.8–4.9)
Alkaline Phosphatase: 112 IU/L (ref 44–121)
BUN/Creatinine Ratio: 15 (ref 9–20)
BUN: 14 mg/dL (ref 6–24)
Bilirubin Total: 0.4 mg/dL (ref 0.0–1.2)
CO2: 24 mmol/L (ref 20–29)
Calcium: 8.9 mg/dL (ref 8.7–10.2)
Chloride: 104 mmol/L (ref 96–106)
Creatinine, Ser: 0.95 mg/dL (ref 0.76–1.27)
Globulin, Total: 2.1 g/dL (ref 1.5–4.5)
Glucose: 96 mg/dL (ref 70–99)
Potassium: 4.8 mmol/L (ref 3.5–5.2)
Sodium: 140 mmol/L (ref 134–144)
Total Protein: 6.3 g/dL (ref 6.0–8.5)
eGFR: 92 mL/min/{1.73_m2} (ref >59)

## 2022-09-02 LAB — LIPID PANEL
Chol/HDL Ratio: 2.4 ratio (ref 0.0–5.0)
Cholesterol, Total: 149 mg/dL (ref 100–199)
HDL: 61 mg/dL (ref >39)
LDL Chol Calc (NIH): 72 mg/dL (ref 0–99)
Triglycerides: 83 mg/dL (ref 0–149)
VLDL Cholesterol Cal: 16 mg/dL (ref 5–40)

## 2022-09-02 LAB — CBC WITH DIFFERENTIAL/PLATELET
Basophils Absolute: 0.1 10*3/uL (ref 0.0–0.2)
Basos: 1 %
EOS (ABSOLUTE): 0.2 10*3/uL (ref 0.0–0.4)
Eos: 4 %
Hematocrit: 41.3 % (ref 37.5–51.0)
Hemoglobin: 14 g/dL (ref 13.0–17.7)
Immature Grans (Abs): 0 10*3/uL (ref 0.0–0.1)
Immature Granulocytes: 0 %
Lymphocytes Absolute: 0.7 10*3/uL (ref 0.7–3.1)
Lymphs: 15 %
MCH: 29.7 pg (ref 26.6–33.0)
MCHC: 33.9 g/dL (ref 31.5–35.7)
MCV: 88 fL (ref 79–97)
Monocytes Absolute: 0.5 10*3/uL (ref 0.1–0.9)
Monocytes: 10 %
Neutrophils Absolute: 3.4 10*3/uL (ref 1.4–7.0)
Neutrophils: 70 %
Platelets: 161 10*3/uL (ref 150–450)
RBC: 4.71 x10E6/uL (ref 4.14–5.80)
RDW: 13.4 % (ref 11.6–15.4)
WBC: 4.9 10*3/uL (ref 3.4–10.8)

## 2022-09-02 LAB — HEMOGLOBIN A1C
Est. average glucose Bld gHb Est-mCnc: 105 mg/dL
Hgb A1c MFr Bld: 5.3 % (ref 4.8–5.6)

## 2022-09-02 LAB — TSH: TSH: 3.19 u[IU]/mL (ref 0.450–4.500)

## 2022-09-02 LAB — VITAMIN D 25 HYDROXY (VIT D DEFICIENCY, FRACTURES): Vit D, 25-Hydroxy: 31.9 ng/mL (ref 30.0–100.0)

## 2022-09-14 ENCOUNTER — Ambulatory Visit: Payer: Federal, State, Local not specified - PPO | Admitting: Urology

## 2022-09-14 VITALS — BP 105/59 | HR 56

## 2022-09-14 DIAGNOSIS — R3912 Poor urinary stream: Secondary | ICD-10-CM

## 2022-09-14 DIAGNOSIS — N401 Enlarged prostate with lower urinary tract symptoms: Secondary | ICD-10-CM

## 2022-09-14 DIAGNOSIS — N200 Calculus of kidney: Secondary | ICD-10-CM | POA: Diagnosis not present

## 2022-09-14 DIAGNOSIS — N138 Other obstructive and reflux uropathy: Secondary | ICD-10-CM | POA: Diagnosis not present

## 2022-09-14 LAB — URINALYSIS, ROUTINE W REFLEX MICROSCOPIC
Bilirubin, UA: NEGATIVE
Glucose, UA: NEGATIVE
Ketones, UA: NEGATIVE
Leukocytes,UA: NEGATIVE
Nitrite, UA: NEGATIVE
Protein,UA: NEGATIVE
RBC, UA: NEGATIVE
Specific Gravity, UA: 1.015 (ref 1.005–1.030)
Urobilinogen, Ur: 0.2 mg/dL (ref 0.2–1.0)
pH, UA: 5.5 (ref 5.0–7.5)

## 2022-09-14 MED ORDER — TAMSULOSIN HCL 0.4 MG PO CAPS: 0.4000 mg | ORAL_CAPSULE | Freq: Every day | ORAL | 11 refills | Status: DC

## 2022-09-14 MED ORDER — ALLOPURINOL 300 MG PO TABS: 300.0000 mg | ORAL_TABLET | Freq: Every day | ORAL | 11 refills | Status: DC

## 2022-09-14 NOTE — Progress Notes (Signed)
09/14/2022 1:22 PM   William Farley November 03, 1960 [id number]  Referring provider: Gilmore Laroche, FNP [address] [place],  [place] [address]  Followup BPh and nephrolithiasis   HPI: William Farley is a 61yo here for followup for BPH and nephrolithiasis. IPSS 13 QOl 2 on flomax 0.4mg . Nocturia 0-1x. Uirne stream strong. No straining to urinate.  24 hr urine showed low pH and high uric acid. He passed one stone since last visit.    PMH: Past Medical History:  Diagnosis Date   Anxiety    Arthritis    CAD (coronary artery disease)    Minimal Non-obstructive CAD seen on coronary CTA 04/2021   Depression    GERD (gastroesophageal reflux disease)    History of kidney stones    History of thrombocytopenia    Hx of syncope    PAF (paroxysmal atrial fibrillation) (HCC)    ChadsVasc Score is 0.  Monitor revealed less than 1% PAF burden.   Seizure (HCC) 02/2021   Sleep apnea    wears CPAP     Surgical History: Past Surgical History:  Procedure Laterality Date   APPENDECTOMY     BIOPSY  05/01/2021   Procedure: BIOPSY;  Surgeon: Malissa Hippo, MD;  [facility];  Service: Endoscopy;;   CERVICAL SPINE SURGERY  2018   CHOLECYSTECTOMY     COLONOSCOPY WITH PROPOFOL N/A 05/01/2021   Procedure: COLONOSCOPY WITH PROPOFOL;  Surgeon: Malissa Hippo, MD;  [facility];  Service: Endoscopy;  Laterality: N/A;  1050   CYST EXCISION     CYSTOSCOPY     CYSTOSCOPY WITH RETROGRADE PYELOGRAM, URETEROSCOPY AND STENT PLACEMENT Right 08/08/2020   Procedure: CYSTOSCOPY WITH RIGHT RETROGRADE PYELOGRAM AND RIGHT URETEROSCOPY;  Surgeon: Malen Gauze, MD;  Location: [facility];  Service: Urology;  Laterality: Right;   EXTRACORPOREAL SHOCK WAVE LITHOTRIPSY Right 05/21/2020   Procedure: EXTRACORPOREAL SHOCK WAVE LITHOTRIPSY (ESWL);  Surgeon: Malen Gauze, MD;  Location: [facility];  Service: Urology;  Laterality: Right;   GASTRIC BYPASS OPEN  2003   INCISIONAL HERNIA REPAIR      kidney stone removal     KNEE ARTHROSCOPY Left    multiple   NECK SURGERY     POLYPECTOMY  05/01/2021   Procedure: POLYPECTOMY;  Surgeon: Malissa Hippo, MD;  [facility];  Service: Endoscopy;;   REPLACEMENT TOTAL KNEE Right    RHINOPLASTY     STONE EXTRACTION WITH BASKET Right 08/08/2020   Procedure: STONE EXTRACTION WITH BASKET;  Surgeon: Malen Gauze, MD;  Location: [facility];  Service: Urology;  Laterality: Right;   TOTAL KNEE ARTHROPLASTY Left 03/10/2022   Procedure: TOTAL KNEE ARTHROPLASTY;  Surgeon: Vickki Hearing, MD;  Location: [facility];  Service: Orthopedics;  Laterality: Left;    Home Medications:  Allergies as of 09/14/2022       Reactions   Codeine Itching, Rash, Other (See Comments)        Medication List        Accurate as of September 14, 2022  1:22 PM. If you have any questions, ask your nurse or doctor.          dicyclomine 10 MG capsule Commonly known as: BENTYL Take 1 capsule (10 mg total) by mouth 4 (four) times daily -  before meals and at bedtime.   DULoxetine 60 MG capsule Commonly known as: CYMBALTA Take 60 mg by mouth 2 (two) times daily.   ibuprofen 800 MG  tablet Commonly known as: ADVIL Take 1 tablet (800 mg total) by mouth every 8 (eight) hours as needed.   loperamide 2 MG tablet Commonly known as: IMODIUM A-D Take 1 tablet (2 mg total) by mouth 4 (four) times daily as needed for diarrhea or loose stools.   LORazepam 1 MG tablet Commonly known as: ATIVAN Take 1 mg by mouth 3 (three) times daily as needed for anxiety.   metoprolol succinate 25 MG 24 hr tablet Commonly known as: TOPROL-XL TAKE 1 TABLET(25 MG) BY MOUTH DAILY   NON FORMULARY Pt uses a cpap nightly   omeprazole 20 MG capsule Commonly known as: PRILOSEC Take 20 mg by mouth in the morning.   tamsulosin 0.4 MG Caps capsule Commonly known as: FLOMAX Take 1 capsule (0.4 mg total) by mouth daily after supper.   VITAMIN B12 PO Take 1 tablet by mouth  every other day.   VITAMIN D3 PO Take 7,500 mcg by mouth daily.   zolpidem 10 MG tablet Commonly known as: AMBIEN Take 10 mg by mouth at bedtime.        Allergies:  Allergies  Allergen Reactions   Codeine Itching, Rash and Other (See Comments)    Family History: Family History  Problem Relation Age of Onset   Heart disease Father 15   Kidney failure Father    Colon cancer Neg Hx     Social History:  reports that he quit smoking about 26 years ago. His smoking use included cigarettes. He has a 5.00 pack-year smoking history. He has been exposed to tobacco smoke. He has never used smokeless tobacco. He reports that he does not currently use alcohol. He reports current drug use. Drug: Marijuana.  ROS: All other review of systems were reviewed and are negative except what is noted above in HPI  Physical Exam: BP (!) 105/59   Pulse (!) 56   Constitutional:  Alert and oriented, No acute distress. HEENT: [place] AT, moist mucus membranes.  Trachea midline, no masses. Cardiovascular: No clubbing, cyanosis, or edema. Respiratory: Normal respiratory effort, no increased work of breathing. GI: Abdomen is soft, nontender, nondistended, no abdominal masses GU: No CVA tenderness.  Lymph: No cervical or inguinal lymphadenopathy. Skin: No rashes, bruises or suspicious lesions. Neurologic: Grossly intact, no focal deficits, moving all 4 extremities. Psychiatric: Normal mood and affect.  Laboratory Data: Lab Results  Component Value Date   WBC 4.9 09/01/2022   HGB 14.0 09/01/2022   HCT 41.3 09/01/2022   MCV 88 09/01/2022   PLT 161 09/01/2022    Lab Results  Component Value Date   CREATININE 0.95 09/01/2022    No results found for: "PSA"  No results found for: "TESTOSTERONE"  Lab Results  Component Value Date   HGBA1C 5.3 09/01/2022    Urinalysis    Component Value Date/Time   APPEARANCEUR Clear 07/15/2022 1545   GLUCOSEU Negative 07/15/2022 1545   BILIRUBINUR  Negative 07/15/2022 1545   PROTEINUR Negative 07/15/2022 1545   NITRITE Negative 07/15/2022 1545   LEUKOCYTESUR Negative 07/15/2022 1545    Lab Results  Component Value Date   LABMICR Comment 07/15/2022   WBCUA None seen 06/26/2020   LABEPIT 0-10 06/26/2020   BACTERIA None seen 06/26/2020    Pertinent Imaging:  Results for orders placed in visit on 07/08/22  DG Abd 1 View  Narrative CLINICAL DATA:  Kidney stones, lithotripsy RIGHT 6 months ago, has passed 5 stones since  EXAM: ABDOMEN - 1 VIEW  COMPARISON:  06/10/2021  FINDINGS: Tiny calculus inferior pole LEFT kidney.  Question 7 mm calculus upper pole LEFT kidney.  No additional urinary tract calcifications.  Bowel gas pattern normal.  Osseous structures unremarkable.  IMPRESSION: Potentially 2 LEFT renal calculi.   Electronically Signed By: Ulyses Southward M.D. On: 07/11/2022 11:44  No results found for this or any previous visit.  No results found for this or any previous visit.  No results found for this or any previous visit.  Results for orders placed during the hospital encounter of 10/11/20  Ultrasound renal complete  Narrative CLINICAL DATA:  History of nephrolithiasis.  EXAM: RENAL / URINARY TRACT ULTRASOUND COMPLETE  COMPARISON:  None.  FINDINGS: Right Kidney:  Renal measurements: 11.8 cm x 7.1 cm x 6.7 cm = volume: 293 mL. Echogenicity within normal limits. A 6 mm shadowing echogenic renal calculus is seen within the lower pole of the right kidney. No mass or hydronephrosis visualized.  Left Kidney:  Renal measurements: 13.8 cm x 5.7 cm x 4.8 cm = volume: 200 mL. Echogenicity within normal limits. No mass or hydronephrosis visualized.  Bladder:  Appears normal for degree of bladder distention.  Other:  None.  IMPRESSION: 6 mm nonobstructing renal stone within the right kidney.   Electronically Signed By: Aram Candela M.D. On: 10/12/2020 19:10  No valid  procedures specified. No results found for this or any previous visit.  Results for orders placed in visit on 11/19/20  CT RENAL STONE STUDY  Narrative CLINICAL DATA:  Two weeks of intermittent sharp severe nonradiating right flank pain.  EXAM: CT ABDOMEN AND PELVIS WITHOUT CONTRAST  TECHNIQUE: Multidetector CT imaging of the abdomen and pelvis was performed following the standard protocol without IV contrast.  COMPARISON:  CT February 29, 2020  FINDINGS: Lower chest: No acute abnormality.  Hepatobiliary: Unremarkable noncontrast appearance of the hepatic parenchyma. Gallbladder surgically absent. No biliary ductal dilation.  Pancreas: Within normal limits.  Spleen: Within normal limits.  Adrenals/Urinary Tract: Bilateral adrenal glands are unremarkable. No hydronephrosis. Left kidney is unremarkable. Punctate nonobstructive right lower pole renal stone. No obstructive ureteral or bladder calculus visualized. Urinary bladder is decompressed limiting evaluation.  Stomach/Bowel: Prior gastric bypass. No pathologic dilation of small or large bowel. Appendix is surgically absent.  Vascular/Lymphatic: Aortic atherosclerosis without abdominal aortic aneurysm. No pathologically enlarged abdominal or pelvic lymph nodes.  Reproductive: Prostate is unremarkable.  Other: Fat in bilateral inguinal canals.  No abdominopelvic ascites.  Musculoskeletal: Mild thoracolumbar spondylosis. No acute osseous abnormality.  IMPRESSION: 1. No acute findings in the abdomen or pelvis. 2. Punctate nonobstructive right lower pole renal stone. No obstructive ureteral or bladder calculus visualized. 3. Prior gastric bypass and cholecystectomy. 4.  Aortic Atherosclerosis (ICD10-I70.0).   Electronically Signed By: Maudry Mayhew M.D. On: 11/20/2020 11:11   Assessment & Plan:    1. Nephrolithiasis -allopurinol 300mg  daily - Urinalysis, Routine w reflex microscopic  2. Benign  prostatic hyperplasia with urinary obstruction Flomax 0.4mg  daily  3. Weak urinary stream Flomax 0.4mg  daily   No follow-ups on file.  Wilkie Aye, MD  [facility] [place]

## 2022-09-20 ENCOUNTER — Encounter: Payer: Self-pay | Admitting: Urology

## 2022-09-20 NOTE — Patient Instructions (Signed)

## 2022-10-12 DIAGNOSIS — K029 Dental caries, unspecified: Secondary | ICD-10-CM | POA: Diagnosis not present

## 2022-10-13 ENCOUNTER — Ambulatory Visit: Payer: Federal, State, Local not specified - PPO | Admitting: Gastroenterology

## 2022-10-13 ENCOUNTER — Encounter: Payer: Self-pay | Admitting: Gastroenterology

## 2022-10-13 VITALS — BP 125/77 | HR 63 | Temp 98.6°F | Ht 71.0 in | Wt 240.6 lb

## 2022-10-13 DIAGNOSIS — K580 Irritable bowel syndrome with diarrhea: Secondary | ICD-10-CM

## 2022-10-13 DIAGNOSIS — K029 Dental caries, unspecified: Secondary | ICD-10-CM | POA: Diagnosis not present

## 2022-10-13 NOTE — Progress Notes (Signed)
Gastroenterology Office Note     Primary Care Physician:  Gilmore Laroche, FNP  Primary Gastroenterologist: Dr. Marletta Lor   Chief Complaint   Chief Complaint  Patient presents with   Follow-up    Follow up on diarrhea and reflux     History of Present Illness   William Farley is a 62 y.o. male presenting today with history of chronic GERD, chronic loose stools for 40 years, last seen in March 2024.   Was given trial of dicyclomine prescribed in March 2024 when last seen. Felt likely dealing with IBS-D.   Pain chronically in right-side for past 30 years. Just lives with it. Has had multiple tests in the past including biopsy per patient. Pain present to gastric bypass. Pain continued s/p cholecystectomy 6 years ago.   Dicyclomine QID. Decreased frequency but consistency the same. No rectal bleeding. Not taking imodium. Sometimes stool green. Sometimes greasy/oily.   Had diarrhea prior to gastric bypass. Gastric bypass in 2003. Not currently smoking but has history of smoking.    Last colonoscopy 05/01/2021 with a few small polyps removed, 1 tubular adenoma. Recommended 7-year recall. No family history of colorectal malignancy   Past Medical History:  Diagnosis Date   Anxiety    Arthritis    CAD (coronary artery disease)    Minimal Non-obstructive CAD seen on coronary CTA 04/2021   Depression    GERD (gastroesophageal reflux disease)    History of kidney stones    History of thrombocytopenia    Hx of syncope    PAF (paroxysmal atrial fibrillation) (HCC)    ChadsVasc Score is 0.  Monitor revealed less than 1% PAF burden.   Seizure (HCC) 02/2021   Sleep apnea    wears CPAP     Past Surgical History:  Procedure Laterality Date   APPENDECTOMY     BIOPSY  05/01/2021   Procedure: BIOPSY;  Surgeon: Malissa Hippo, MD;  [facility];  Service: Endoscopy;;   CERVICAL SPINE SURGERY  2018   CHOLECYSTECTOMY     COLONOSCOPY WITH PROPOFOL N/A 05/01/2021    Procedure: COLONOSCOPY WITH PROPOFOL;  Surgeon: Malissa Hippo, MD;  [facility];  Service: Endoscopy;  Laterality: N/A;  1050   CYST EXCISION     CYSTOSCOPY     CYSTOSCOPY WITH RETROGRADE PYELOGRAM, URETEROSCOPY AND STENT PLACEMENT Right 08/08/2020   Procedure: CYSTOSCOPY WITH RIGHT RETROGRADE PYELOGRAM AND RIGHT URETEROSCOPY;  Surgeon: Malen Gauze, MD;  [facility];  Service: Urology;  Laterality: Right;   EXTRACORPOREAL SHOCK WAVE LITHOTRIPSY Right 05/21/2020   Procedure: EXTRACORPOREAL SHOCK WAVE LITHOTRIPSY (ESWL);  Surgeon: Malen Gauze, MD;  Location[facility];  Service: Urology;  Laterality: Right;   GASTRIC BYPASS OPEN  2003   INCISIONAL HERNIA REPAIR     kidney stone removal     KNEE ARTHROSCOPY Left    multiple   NECK SURGERY     POLYPECTOMY  05/01/2021   Procedure: POLYPECTOMY;  Surgeon: Malissa Hippo, MD;  [facility];  Service: Endoscopy;;   REPLACEMENT TOTAL KNEE Right    RHINOPLASTY     STONE EXTRACTION WITH BASKET Right 08/08/2020   Procedure: STONE EXTRACTION WITH BASKET;  Surgeon: Malen Gauze, MD;  Location[facility];  Service: Urology;  Laterality: Right;   TOTAL KNEE ARTHROPLASTY Left 03/10/2022   Procedure: TOTAL KNEE ARTHROPLASTY;  Surgeon: Vickki Hearing, MD;  Location: [facility];  Service:  Orthopedics;  Laterality: Left;    Current Outpatient Medications  Medication Sig Dispense Refill   allopurinol (ZYLOPRIM) 300 MG tablet Take 1 tablet (300 mg total) by mouth daily. 30 tablet 11   Cholecalciferol (VITAMIN D3 PO) Take 7,500 mcg by mouth daily.     Cyanocobalamin (VITAMIN B12 PO) Take 1 tablet by mouth every other day.     dicyclomine (BENTYL) 10 MG capsule Take 1 capsule (10 mg total) by mouth 4 (four) times daily -  before meals and at bedtime. 120 capsule 11   DULoxetine (CYMBALTA) 60 MG capsule Take 60 mg by mouth 2 (two) times daily.     ibuprofen (ADVIL) 800 MG tablet Take 1 tablet (800 mg total) by mouth  every 8 (eight) hours as needed. 90 tablet 1   loperamide (IMODIUM A-D) 2 MG tablet Take 1 tablet (2 mg total) by mouth 4 (four) times daily as needed for diarrhea or loose stools. 30 tablet 0   LORazepam (ATIVAN) 1 MG tablet Take 1 mg by mouth 3 (three) times daily as needed for anxiety.     metoprolol succinate (TOPROL-XL) 25 MG 24 hr tablet TAKE 1 TABLET(25 MG) BY MOUTH DAILY 90 tablet 3   NON FORMULARY Pt uses a cpap nightly     omeprazole (PRILOSEC) 20 MG capsule Take 20 mg by mouth in the morning.     tamsulosin (FLOMAX) 0.4 MG CAPS capsule Take 1 capsule (0.4 mg total) by mouth daily after supper. 30 capsule 11   zolpidem (AMBIEN) 10 MG tablet Take 10 mg by mouth at bedtime.     No current facility-administered medications for this visit.    Allergies as of 10/13/2022 - Review Complete 10/13/2022  Allergen Reaction Noted   Codeine Itching, Rash, and Other (See Comments) 03/11/2012    Family History  Problem Relation Age of Onset   Heart disease Father 7   Kidney failure Father    Colon cancer Neg Hx     Social History   Socioeconomic History   Marital status: Married    Spouse name: Not on file   Number of children: Not on file   Years of education: Not on file   Highest education level: Associate degree: occupational, Scientist, product/process development, or vocational program  Occupational History   Occupation: Economist  Tobacco Use   Smoking status: Former    Current packs/day: 0.00    Average packs/day: 0.5 packs/day for 10.0 years (5.0 ttl pk-yrs)    Types: Cigarettes    Start date: 05/20/1986    Quit date: 05/20/1996    Years since quitting: 26.4    Passive exposure: Past   Smokeless tobacco: Never  Vaping Use   Vaping status: Never Used  Substance and Sexual Activity   Alcohol use: Not Currently   Drug use: Yes    Types: Marijuana    Comment: 1 or 2 x weeks   Sexual activity: Yes  Other Topics Concern   Not on file  Social History Narrative   Right handed     Married wife   One story home   Social Determinants of Health   Financial Resource Strain: Low Risk  (08/30/2022)   Overall Financial Resource Strain (CARDIA)    Difficulty of Paying Living Expenses: Not hard at all  Food Insecurity: No Food Insecurity (08/30/2022)   Hunger Vital Sign    Worried About Running Out of Food in the Last Year: Never true    Ran Out of Food in  the Last Year: Never true  Transportation Needs: No Transportation Needs (08/30/2022)   PRAPARE - Administrator, Civil Service (Medical): No    Lack of Transportation (Non-Medical): No  Physical Activity: Insufficiently Active (08/30/2022)   Exercise Vital Sign    Days of Exercise per Week: 3 days    Minutes of Exercise per Session: 10 min  Stress: Stress Concern Present (08/30/2022)   [employer] of Occupational Health - Occupational Stress Questionnaire    Feeling of Stress : Very much  Social Connections: Moderately Integrated (08/30/2022)   Social Connection and Isolation Panel [NHANES]    Frequency of Communication with Friends and Family: Twice a week    Frequency of Social Gatherings with Friends and Family: Three times a week    Attends Religious Services: Never    Active Member of Clubs or Organizations: Yes    Attends Banker Meetings: More than 4 times per year    Marital Status: Married  Catering manager Violence: Not At Risk (03/10/2022)   Humiliation, Afraid, Rape, and Kick questionnaire    Fear of Current or Ex-Partner: No    Emotionally Abused: No    Physically Abused: No    Sexually Abused: No     Review of Systems   Gen: Denies any fever, chills, fatigue, weight loss, lack of appetite.  CV: Denies chest pain, heart palpitations, peripheral edema, syncope.  Resp: Denies shortness of breath at rest or with exertion. Denies wheezing or cough.  GI: Denies dysphagia or odynophagia. Denies jaundice, hematemesis, fecal incontinence. GU : Denies urinary burning, urinary  frequency, urinary hesitancy MS: Denies joint pain, muscle weakness, cramps, or limitation of movement.  Derm: Denies rash, itching, dry skin Psych: Denies depression, anxiety, memory loss, and confusion Heme: Denies bruising, bleeding, and enlarged lymph nodes.   Physical Exam   BP 125/77   Pulse 63   Temp 98.6 F (37 C)   Ht 5\' 11"  (1.803 m)   Wt 240 lb 9.6 oz (109.1 kg)   BMI 33.56 kg/m  General:   Alert and oriented. Pleasant and cooperative. Well-nourished and well-developed.  Head:  Normocephalic and atraumatic. Eyes:  Without icterus Abdomen:  +BS, soft, non-tender and non-distended. No HSM noted. No guarding or rebound. No masses appreciated.  Rectal:  Deferred  Msk:  Symmetrical without gross deformities. Normal posture. Extremities:  Without edema. Neurologic:  Alert and  oriented x4;  grossly normal neurologically. Skin:  Intact without significant lesions or rashes. Psych:  Alert and cooperative. Normal mood and affect.   Assessment   William Farley is a 62 y.o. male presenting today with history of chronic GERD, chronic loose stools for 40 years, last seen in March 2024.   GERD: controlled on PPI. No alarm signs/symptoms.  Diarrhea: chronic as noted and present prior to cholecystectomy and gastric bypass. Colonoscopy on file from Feb 2023 unrevealing but no random biopsies. Unclear if TI visualized. Some improvement in frequency with dicyclomine QID but not consistency. He is curious to try other therapies. Unclear why chronic right-sided pain for 30 years, but he reports this has been extensively evaluated and remains unchanged.     PLAN    Will check celiac serologies Stop dicyclomine and trial Zenpep 60k units with meals Message with update Resume dicyclomine if no improvement on PERT 3 month follow-up   Gelene Mink, PhD, ANP-BC [facility]

## 2022-10-13 NOTE — Patient Instructions (Signed)
Please complete the labs at [facility] when you are able.  I have given samples of Zenpep (pancreatic enzymes) to try. Take 1 capsule while eating (breakfast, lunch, dinner). Do not take before or after: take during the meal for best effect. Let me know how this works for you! Stop dicyclomine while taking this.   If no improvement on Zenpep, resume the dicyclomine.  Please let me know how the samples work for you! We have room to increase it if needed.  We will see you in 3 months!  It was a pleasure to see you today. I want to create trusting relationships with patients and provide genuine, compassionate, and quality care. I truly value your feedback, so please be on the lookout for a survey regarding your visit with me today. I appreciate your time in completing this!         Gelene Mink, PhD, ANP-BC [facility]

## 2022-10-28 DIAGNOSIS — K580 Irritable bowel syndrome with diarrhea: Secondary | ICD-10-CM | POA: Diagnosis not present

## 2022-11-03 MED ORDER — ZENPEP 60000-189600 UNITS PO CPEP: 60000.0000 [IU] | ORAL_CAPSULE | Freq: Three times a day (TID) | ORAL | 3 refills | Status: DC

## 2022-11-04 ENCOUNTER — Telehealth: Payer: Self-pay

## 2022-11-04 NOTE — Telephone Encounter (Signed)
PA denied. Documentation on your desk in the yellow folder. Alternatives has to be tried first. Please advise

## 2022-11-04 NOTE — Telephone Encounter (Signed)
PA attempted for the pt's Zenpep. Documentation stopped/kicked out the system due to the fact the pt's insurance requires the pt to have tried several other medications and failed. Advised the Dr of this and was advised to continue with the Zenpep.   Phoned the pt's insurance and spoke with Jan was advised that this will require pt to have tried the medications first, but Jan from the pt's insurance is sending documentation for the dr to see what the next step will be. Waiting on documenttion

## 2022-11-05 ENCOUNTER — Other Ambulatory Visit: Payer: Self-pay

## 2022-11-05 NOTE — Telephone Encounter (Signed)
On the pt's formulary: Creon, Pancreaze, Pancrelipase and Pertzye. Please advise

## 2022-11-05 NOTE — Telephone Encounter (Signed)
What alternatives does his insurance state need to be done instead of Zenpep?

## 2022-11-05 NOTE — Telephone Encounter (Signed)
Viokace or Creon

## 2022-11-10 ENCOUNTER — Telehealth: Payer: Self-pay

## 2022-11-10 MED ORDER — PANCRELIPASE (LIP-PROT-AMYL) 36000-114000 UNITS PO CPEP: ORAL_CAPSULE | ORAL | 11 refills | Status: DC

## 2022-11-10 MED ORDER — PANCRELIPASE (LIP-PROT-AMYL) 36000-114000 UNITS PO CPEP: 36000.0000 [IU] | ORAL_CAPSULE | Freq: Three times a day (TID) | ORAL | 2 refills | Status: DC

## 2022-11-10 NOTE — Addendum Note (Signed)
Addended by: Gelene Mink on: 11/10/2022 11:26 AM   Modules accepted: Orders

## 2022-11-10 NOTE — Telephone Encounter (Signed)
noted 

## 2022-11-10 NOTE — Telephone Encounter (Signed)
Anna  The pharmacy called and wanted to know if she can change the pt's Creon from 240 caps to 250 caps because they are not allowed to open new packages. Heather from the pharmacy phoned. Please advise

## 2022-11-10 NOTE — Telephone Encounter (Signed)
Sure thing!

## 2022-11-10 NOTE — Telephone Encounter (Signed)
I sent in Creon instead. Take 2 capsules with meals and 1 with snacks.

## 2022-11-25 ENCOUNTER — Encounter: Payer: Self-pay | Admitting: Gastroenterology

## 2022-12-01 ENCOUNTER — Ambulatory Visit: Payer: Federal, State, Local not specified - PPO | Admitting: Family Medicine

## 2022-12-01 ENCOUNTER — Encounter: Payer: Self-pay | Admitting: Family Medicine

## 2022-12-01 VITALS — BP 109/70 | HR 55 | Ht 71.0 in | Wt 236.0 lb

## 2022-12-01 DIAGNOSIS — K219 Gastro-esophageal reflux disease without esophagitis: Secondary | ICD-10-CM | POA: Diagnosis not present

## 2022-12-01 DIAGNOSIS — K580 Irritable bowel syndrome with diarrhea: Secondary | ICD-10-CM

## 2022-12-01 DIAGNOSIS — Z23 Encounter for immunization: Secondary | ICD-10-CM | POA: Diagnosis not present

## 2022-12-01 DIAGNOSIS — R7301 Impaired fasting glucose: Secondary | ICD-10-CM

## 2022-12-01 DIAGNOSIS — F411 Generalized anxiety disorder: Secondary | ICD-10-CM

## 2022-12-01 DIAGNOSIS — E559 Vitamin D deficiency, unspecified: Secondary | ICD-10-CM

## 2022-12-01 DIAGNOSIS — E038 Other specified hypothyroidism: Secondary | ICD-10-CM

## 2022-12-01 DIAGNOSIS — E7849 Other hyperlipidemia: Secondary | ICD-10-CM

## 2022-12-01 NOTE — Patient Instructions (Signed)
I appreciate the opportunity to provide care to you today!    Follow up:  4 months  Labs: please stop by the lab 2-3 before your next appointment to get your blood drawn (CBC, CMP, TSH, Lipid profile, HgA1c, Vit D)   Attached with your AVS, you will find valuable resources for self-education. I highly recommend dedicating some time to thoroughly examine them.   Please continue to a heart-healthy diet and increase your physical activities. Try to exercise for at least five days a week.    It was a pleasure to see you and I look forward to continuing to work together on your health and well-being. Please do not hesitate to call the office if you need care or have questions about your care.  In case of emergency, please visit the Emergency Department for urgent care, or contact our clinic at [phone] to schedule an appointment. We're here to help you!   Have a wonderful day and week. With Gratitude, Gilmore Laroche MSN, FNP-BC

## 2022-12-01 NOTE — Progress Notes (Unsigned)
Established Patient Office Visit  Subjective:  Patient ID: William Farley, male    DOB: [birth year]  Age: 62 y.o. MRN: [id number]  CC:  Chief Complaint  Patient presents with   Care Management    Follow up    HPI William Farley is a 62 y.o. male with past medical history of irritable bowel syndrome, GERD, generalized anxiety disorder presents for f/u of  chronic medical conditions. For the details of today's visit, please refer to the assessment and plan.     Past Medical History:  Diagnosis Date   Anxiety    Arthritis    CAD (coronary artery disease)    Minimal Non-obstructive CAD seen on coronary CTA 04/2021   Depression    GERD (gastroesophageal reflux disease)    History of kidney stones    History of thrombocytopenia    Hx of syncope    PAF (paroxysmal atrial fibrillation) (HCC)    ChadsVasc Score is 0.  Monitor revealed less than 1% PAF burden.   Seizure (HCC) 02/2021   Sleep apnea    wears CPAP     Past Surgical History:  Procedure Laterality Date   APPENDECTOMY     BIOPSY  05/01/2021   Procedure: BIOPSY;  Surgeon: Malissa Hippo, MD;  [facility];  Service: Endoscopy;;   CERVICAL SPINE SURGERY  2018   CHOLECYSTECTOMY     COLONOSCOPY WITH PROPOFOL N/A 05/01/2021   Procedure: COLONOSCOPY WITH PROPOFOL;  Surgeon: Malissa Hippo, MD;  [facility];  Service: Endoscopy;  Laterality: N/A;  1050   CYST EXCISION     CYSTOSCOPY     CYSTOSCOPY WITH RETROGRADE PYELOGRAM, URETEROSCOPY AND STENT PLACEMENT Right 08/08/2020   Procedure: CYSTOSCOPY WITH RIGHT RETROGRADE PYELOGRAM AND RIGHT URETEROSCOPY;  Surgeon: Malen Gauze, MD;  Location: [facility];  Service: Urology;  Laterality: Right;   EXTRACORPOREAL SHOCK WAVE LITHOTRIPSY Right 05/21/2020   Procedure: EXTRACORPOREAL SHOCK WAVE LITHOTRIPSY (ESWL);  Surgeon: Malen Gauze, MD;  Location: [facility];  Service: Urology;  Laterality: Right;   GASTRIC BYPASS OPEN  2003   INCISIONAL HERNIA  REPAIR     kidney stone removal     KNEE ARTHROSCOPY Left    multiple   NECK SURGERY     POLYPECTOMY  05/01/2021   Procedure: POLYPECTOMY;  Surgeon: Malissa Hippo, MD;  [facility];  Service: Endoscopy;;   REPLACEMENT TOTAL KNEE Right    RHINOPLASTY     STONE EXTRACTION WITH BASKET Right 08/08/2020   Procedure: STONE EXTRACTION WITH BASKET;  Surgeon: Malen Gauze, MD;  Location: [facility];  Service: Urology;  Laterality: Right;   TOTAL KNEE ARTHROPLASTY Left 03/10/2022   Procedure: TOTAL KNEE ARTHROPLASTY;  Surgeon: Vickki Hearing, MD;  Location: [facility];  Service: Orthopedics;  Laterality: Left;    Family History  Problem Relation Age of Onset   Heart disease Father 79   Kidney failure Father    Colon cancer Neg Hx     Social History   Socioeconomic History   Marital status: Married    Spouse name: Not on file   Number of children: Not on file   Years of education: Not on file   Highest education level: Associate degree: occupational, Scientist, product/process development, or vocational program  Occupational History   Occupation: Economist  Tobacco Use   Smoking status: Former    Current packs/day: 0.00    Average packs/day: 0.5 packs/day for 10.0 years (5.0  ttl pk-yrs)    Types: Cigarettes    Start date: 05/20/1986    Quit date: 05/20/1996    Years since quitting: 26.5    Passive exposure: Past   Smokeless tobacco: Never  Vaping Use   Vaping status: Never Used  Substance and Sexual Activity   Alcohol use: Not Currently   Drug use: Yes    Types: Marijuana    Comment: 1 or 2 x weeks   Sexual activity: Yes  Other Topics Concern   Not on file  Social History Narrative   Right handed    Married wife   One story home   Social Determinants of Health   Financial Resource Strain: Low Risk  (08/30/2022)   Overall Financial Resource Strain (CARDIA)    Difficulty of Paying Living Expenses: Not hard at all  Food Insecurity: No Food Insecurity (08/30/2022)   Hunger  Vital Sign    Worried About Running Out of Food in the Last Year: Never true    Ran Out of Food in the Last Year: Never true  Transportation Needs: No Transportation Needs (08/30/2022)   PRAPARE - Administrator, Civil Service (Medical): No    Lack of Transportation (Non-Medical): No  Physical Activity: Insufficiently Active (08/30/2022)   Exercise Vital Sign    Days of Exercise per Week: 3 days    Minutes of Exercise per Session: 10 min  Stress: Stress Concern Present (08/30/2022)   [employer] of Occupational Health - Occupational Stress Questionnaire    Feeling of Stress : Very much  Social Connections: Moderately Integrated (08/30/2022)   Social Connection and Isolation Panel [NHANES]    Frequency of Communication with Friends and Family: Twice a week    Frequency of Social Gatherings with Friends and Family: Three times a week    Attends Religious Services: Never    Active Member of Clubs or Organizations: Yes    Attends Banker Meetings: More than 4 times per year    Marital Status: Married  Catering manager Violence: Not At Risk (03/10/2022)   Humiliation, Afraid, Rape, and Kick questionnaire    Fear of Current or Ex-Partner: No    Emotionally Abused: No    Physically Abused: No    Sexually Abused: No    Outpatient Medications Prior to Visit  Medication Sig Dispense Refill   allopurinol (ZYLOPRIM) 300 MG tablet Take 1 tablet (300 mg total) by mouth daily. 30 tablet 11   Cholecalciferol (VITAMIN D3 PO) Take 7,500 mcg by mouth daily.     Cyanocobalamin (VITAMIN B12 PO) Take 1 tablet by mouth every other day.     dicyclomine (BENTYL) 10 MG capsule Take 1 capsule (10 mg total) by mouth 4 (four) times daily -  before meals and at bedtime. 120 capsule 11   DULoxetine (CYMBALTA) 60 MG capsule Take 60 mg by mouth 2 (two) times daily.     ibuprofen (ADVIL) 800 MG tablet Take 1 tablet (800 mg total) by mouth every 8 (eight) hours as needed. 90 tablet 1    lipase/protease/amylase (CREON) 36000 UNITS CPEP capsule Take 2 capsules (72,000 Units total) by mouth 3 (three) times daily with meals. May also take 1 capsule (36,000 Units total) as needed (with snacks). 240 capsule 11   loperamide (IMODIUM A-D) 2 MG tablet Take 1 tablet (2 mg total) by mouth 4 (four) times daily as needed for diarrhea or loose stools. 30 tablet 0   LORazepam (ATIVAN) 1 MG tablet Take 1  mg by mouth 3 (three) times daily as needed for anxiety.     metoprolol succinate (TOPROL-XL) 25 MG 24 hr tablet TAKE 1 TABLET(25 MG) BY MOUTH DAILY 90 tablet 3   NON FORMULARY Pt uses a cpap nightly     omeprazole (PRILOSEC) 20 MG capsule Take 20 mg by mouth in the morning.     tamsulosin (FLOMAX) 0.4 MG CAPS capsule Take 1 capsule (0.4 mg total) by mouth daily after supper. 30 capsule 11   zolpidem (AMBIEN) 10 MG tablet Take 10 mg by mouth at bedtime.     No facility-administered medications prior to visit.    Allergies  Allergen Reactions   Codeine Itching, Rash and Other (See Comments)    ROS Review of Systems  Constitutional:  Negative for chills, fatigue and fever.  Eyes:  Negative for visual disturbance.  Respiratory:  Negative for chest tightness and shortness of breath.   Cardiovascular:  Negative for chest pain and palpitations.  Neurological:  Negative for dizziness and headaches.      Objective:    Physical Exam HENT:     Head: Normocephalic.     Right Ear: External ear normal.     Left Ear: External ear normal.     Nose: No congestion or rhinorrhea.     Mouth/Throat:     Mouth: Mucous membranes are moist.  Cardiovascular:     Rate and Rhythm: Regular rhythm.     Heart sounds: No murmur heard. Pulmonary:     Effort: No respiratory distress.     Breath sounds: Normal breath sounds.  Neurological:     Mental Status: He is alert.     BP 109/70   Pulse (!) 55   Ht 5\' 11"  (1.803 m)   Wt 236 lb 0.6 oz (107.1 kg)   SpO2 94%   BMI 32.92 kg/m  Wt Readings  from Last 3 Encounters:  12/01/22 236 lb 0.6 oz (107.1 kg)  10/13/22 240 lb 9.6 oz (109.1 kg)  09/01/22 243 lb 1.9 oz (110.3 kg)    Lab Results  Component Value Date   TSH 3.190 09/01/2022   Lab Results  Component Value Date   WBC 4.9 09/01/2022   HGB 14.0 09/01/2022   HCT 41.3 09/01/2022   MCV 88 09/01/2022   PLT 161 09/01/2022   Lab Results  Component Value Date   NA 140 09/01/2022   K 4.8 09/01/2022   CO2 24 09/01/2022   GLUCOSE 96 09/01/2022   BUN 14 09/01/2022   CREATININE 0.95 09/01/2022   BILITOT 0.4 09/01/2022   ALKPHOS 112 09/01/2022   AST 19 09/01/2022   ALT 16 09/01/2022   PROT 6.3 09/01/2022   ALBUMIN 4.2 09/01/2022   CALCIUM 8.9 09/01/2022   ANIONGAP 6 03/11/2022   EGFR 92 09/01/2022   Lab Results  Component Value Date   CHOL 149 09/01/2022   Lab Results  Component Value Date   HDL 61 09/01/2022   Lab Results  Component Value Date   LDLCALC 72 09/01/2022   Lab Results  Component Value Date   TRIG 83 09/01/2022   Lab Results  Component Value Date   CHOLHDL 2.4 09/01/2022   Lab Results  Component Value Date   HGBA1C 5.3 09/01/2022      Assessment & Plan:  Irritable bowel syndrome with diarrhea Assessment & Plan: The patient is under the care of Gastroenterology (GI). He is encouraged to continue regular follow-ups. No complaints were reported during today's  Generalized anxiety disorder Assessment & Plan: The patient is following up with his psychiatrist at the [facility] and is currently taking Cymbalta 60 mg daily. He denies any suicidal thoughts or ideation. He is encouraged to continue attending his scheduled follow-up appointments with his psychiatrist.     Gastro-esophageal reflux disease without esophagitis Assessment & Plan: Stable on omeprazole 20 mg daily GERD diet encourage   IFG (impaired fasting glucose) -     Hemoglobin A1c  Vitamin D deficiency -     VITAMIN D 25 Hydroxy (Vit-D Deficiency, Fractures)  Other  specified hypothyroidism -     TSH + free T4  Other hyperlipidemia -     Lipid panel -     CMP14+EGFR -     CBC with Differential/Platelet  Encounter for immunization -     Flu vaccine trivalent PF, 6mos and older(Flulaval,Afluria,Fluarix,Fluzone)  Note: This chart has been completed using Engineer, civil (consulting) software, and while attempts have been made to ensure accuracy, certain words and phrases may not be transcribed as intended.    Follow-up: Return in about 4 months (around 04/02/2023).   Gilmore Laroche, FNP

## 2022-12-02 NOTE — Assessment & Plan Note (Signed)
Stable on omeprazole 20 mg daily GERD diet encourage

## 2022-12-02 NOTE — Assessment & Plan Note (Addendum)
The patient is under the care of Gastroenterology (GI). He is encouraged to continue regular follow-ups. No complaints were reported during today's

## 2022-12-02 NOTE — Assessment & Plan Note (Addendum)
The patient is following up with his psychiatrist at the [facility] and is currently taking Cymbalta 60 mg daily. He denies any suicidal thoughts or ideation. He is encouraged to continue attending his scheduled follow-up appointments with his psychiatrist.

## 2022-12-15 ENCOUNTER — Ambulatory Visit (HOSPITAL_COMMUNITY)
Admission: RE | Admit: 2022-12-15 | Discharge: 2022-12-15 | Disposition: A | Discharge status: Home / Self Care | Payer: Federal, State, Local not specified - PPO | Source: Ambulatory Visit | Attending: Urology | Admitting: Urology

## 2022-12-15 DIAGNOSIS — N200 Calculus of kidney: Secondary | ICD-10-CM | POA: Diagnosis not present

## 2022-12-18 ENCOUNTER — Ambulatory Visit: Payer: Federal, State, Local not specified - PPO | Admitting: Urology

## 2023-01-20 ENCOUNTER — Ambulatory Visit: Payer: Federal, State, Local not specified - PPO | Admitting: Urology

## 2023-01-25 NOTE — Progress Notes (Unsigned)
Name: William Farley DOB: [birth year] MRN: [id number]  History of Present Illness: Mr. Epps is a 62 y.o. male who presents today for follow up visit at [facility]. - GU History: 1. BPH.  - PSA normal (0.46) on 05/21/2021. - Taking Flomax 0.4 mg daily. 2. Kidney stones. - 07/27/2022: Litholink 24 hr urine showed low pH and high uric acid.  At last visit with Dr. Ronne Binning on 09/14/2022: Started on Allopurinol 300 mg daily for stone prevention.   Since last visit: > 12/15/2022: RUS showed a 6 mm right kidney stone; no hydronephrosis.  Today: He reports passing several stones over the past few weeks. Today he denies acute flank pain or abdominal pain.  He reports bothersome urinary frequency, urgency, and terminal dribbling. Denies dysuria, gross hematuria, hesitancy, straining to void, or sensations of incomplete emptying. He reports significant caffeine intake (2 liters of Coke Zero daily), which he has struggled to cut down on.   Fall Screening: Do you usually have a device to assist in your mobility? No  Medications: Current Outpatient Medications  Medication Sig Dispense Refill   allopurinol (ZYLOPRIM) 300 MG tablet Take 1 tablet (300 mg total) by mouth daily. 30 tablet 11   Cholecalciferol (VITAMIN D3 PO) Take 7,500 mcg by mouth daily.     Cyanocobalamin (VITAMIN B12 PO) Take 1 tablet by mouth every other day.     dicyclomine (BENTYL) 10 MG capsule Take 1 capsule (10 mg total) by mouth 4 (four) times daily -  before meals and at bedtime. 120 capsule 11   DULoxetine (CYMBALTA) 60 MG capsule Take 60 mg by mouth 2 (two) times daily.     ibuprofen (ADVIL) 800 MG tablet Take 1 tablet (800 mg total) by mouth every 8 (eight) hours as needed. 90 tablet 1   lipase/protease/amylase (CREON) 36000 UNITS CPEP capsule Take 2 capsules (72,000 Units total) by mouth 3 (three) times daily with meals. May also take 1 capsule (36,000 Units total) as needed (with snacks). 240  capsule 11   loperamide (IMODIUM A-D) 2 MG tablet Take 1 tablet (2 mg total) by mouth 4 (four) times daily as needed for diarrhea or loose stools. (Patient not taking: Reported on 01/26/2023) 30 tablet 0   LORazepam (ATIVAN) 1 MG tablet Take 1 mg by mouth 3 (three) times daily as needed for anxiety.     metoprolol succinate (TOPROL-XL) 25 MG 24 hr tablet TAKE 1 TABLET(25 MG) BY MOUTH DAILY 90 tablet 3   NON FORMULARY Pt uses a cpap nightly     omeprazole (PRILOSEC) 20 MG capsule Take 20 mg by mouth in the morning.     tamsulosin (FLOMAX) 0.4 MG CAPS capsule Take 1 capsule (0.4 mg total) by mouth daily after supper. 30 capsule 11   zolpidem (AMBIEN) 10 MG tablet Take 10 mg by mouth at bedtime.     No current facility-administered medications for this visit.    Allergies: Allergies  Allergen Reactions   Codeine Itching, Rash and Other (See Comments)    Past Medical History:  Diagnosis Date   Anxiety    Arthritis    CAD (coronary artery disease)    Minimal Non-obstructive CAD seen on coronary CTA 04/2021   Depression    GERD (gastroesophageal reflux disease)    History of kidney stones    History of thrombocytopenia    Hx of syncope    PAF (paroxysmal atrial fibrillation) (HCC)    ChadsVasc Score is 0.  Monitor revealed  less than 1% PAF burden.   Seizure (HCC) 02/2021   Sleep apnea    wears CPAP    Past Surgical History:  Procedure Laterality Date   APPENDECTOMY     BIOPSY  05/01/2021   Procedure: BIOPSY;  Surgeon: Malissa Hippo, MD;  [facility];  Service: Endoscopy;;   CERVICAL SPINE SURGERY  2018   CHOLECYSTECTOMY     COLONOSCOPY WITH PROPOFOL N/A 05/01/2021   Procedure: COLONOSCOPY WITH PROPOFOL;  Surgeon: Malissa Hippo, MD;  [facility];  Service: Endoscopy;  Laterality: N/A;  1050   CYST EXCISION     CYSTOSCOPY     CYSTOSCOPY WITH RETROGRADE PYELOGRAM, URETEROSCOPY AND STENT PLACEMENT Right 08/08/2020   Procedure: CYSTOSCOPY WITH RIGHT  RETROGRADE PYELOGRAM AND RIGHT URETEROSCOPY;  Surgeon: Malen Gauze, MD;  Location: [facility];  Service: Urology;  Laterality: Right;   EXTRACORPOREAL SHOCK WAVE LITHOTRIPSY Right 05/21/2020   Procedure: EXTRACORPOREAL SHOCK WAVE LITHOTRIPSY (ESWL);  Surgeon: Malen Gauze, MD;  Location: [facility];  Service: Urology;  Laterality: Right;   GASTRIC BYPASS OPEN  2003   INCISIONAL HERNIA REPAIR     kidney stone removal     KNEE ARTHROSCOPY Left    multiple   NECK SURGERY     POLYPECTOMY  05/01/2021   Procedure: POLYPECTOMY;  Surgeon: Malissa Hippo, MD;  [facility];  Service: Endoscopy;;   REPLACEMENT TOTAL KNEE Right    RHINOPLASTY     STONE EXTRACTION WITH BASKET Right 08/08/2020   Procedure: STONE EXTRACTION WITH BASKET;  Surgeon: Malen Gauze, MD;  Location: [facility];  Service: Urology;  Laterality: Right;   TOTAL KNEE ARTHROPLASTY Left 03/10/2022   Procedure: TOTAL KNEE ARTHROPLASTY;  Surgeon: Vickki Hearing, MD;  Location: [facility];  Service: Orthopedics;  Laterality: Left;   Family History  Problem Relation Age of Onset   Heart disease Father 19   Kidney failure Father    Colon cancer Neg Hx    Social History   Socioeconomic History   Marital status: Married    Spouse name: Not on file   Number of children: Not on file   Years of education: Not on file   Highest education level: Associate degree: occupational, Scientist, product/process development, or vocational program  Occupational History   Occupation: Economist  Tobacco Use   Smoking status: Former    Current packs/day: 0.00    Average packs/day: 0.5 packs/day for 10.0 years (5.0 ttl pk-yrs)    Types: Cigarettes    Start date: 05/20/1986    Quit date: 05/20/1996    Years since quitting: 26.7    Passive exposure: Past   Smokeless tobacco: Never  Vaping Use   Vaping status: Never Used  Substance and Sexual Activity   Alcohol use: Not Currently   Drug use: Yes    Types: Marijuana    Comment: 1 or 2 x  weeks   Sexual activity: Yes  Other Topics Concern   Not on file  Social History Narrative   Right handed    Married wife   One story home   Social Determinants of Health   Financial Resource Strain: Low Risk  (08/30/2022)   Overall Financial Resource Strain (CARDIA)    Difficulty of Paying Living Expenses: Not hard at all  Food Insecurity: No Food Insecurity (08/30/2022)   Hunger Vital Sign    Worried About Running Out of Food in the Last Year: Never true  Ran Out of Food in the Last Year: Never true  Transportation Needs: No Transportation Needs (08/30/2022)   PRAPARE - Administrator, Civil Service (Medical): No    Lack of Transportation (Non-Medical): No  Physical Activity: Insufficiently Active (08/30/2022)   Exercise Vital Sign    Days of Exercise per Week: 3 days    Minutes of Exercise per Session: 10 min  Stress: Stress Concern Present (08/30/2022)   [employer] of Occupational Health - Occupational Stress Questionnaire    Feeling of Stress : Very much  Social Connections: Moderately Integrated (08/30/2022)   Social Connection and Isolation Panel [NHANES]    Frequency of Communication with Friends and Family: Twice a week    Frequency of Social Gatherings with Friends and Family: Three times a week    Attends Religious Services: Never    Active Member of Clubs or Organizations: Yes    Attends Banker Meetings: More than 4 times per year    Marital Status: Married  Catering manager Violence: Not At Risk (03/10/2022)   Humiliation, Afraid, Rape, and Kick questionnaire    Fear of Current or Ex-Partner: No    Emotionally Abused: No    Physically Abused: No    Sexually Abused: No    SUBJECTIVE  Review of Systems Constitutional: Patient denies any unintentional weight loss or change in strength Cardiovascular: Patient denies chest pain or syncope Respiratory: Patient denies shortness of breath Gastrointestinal: Patient denies nausea, vomiting,  constipation, or diarrhea Musculoskeletal: Patient denies muscle cramps or weakness Neurologic: Patient denies convulsions or seizures Psychiatric: Reports anxiety Hematologic/Lymphatic: Patient denies bleeding tendencies Endocrine: Patient denies heat/cold intolerance  GU: As per HPI.  OBJECTIVE Vitals:   01/26/23 1152  BP: 117/69  Pulse: 71  Temp: 98.2 F (36.8 C)   There is no height or weight on file to calculate BMI.  Physical Examination Constitutional: No obvious distress; patient is non-toxic appearing  Cardiovascular: No visible lower extremity edema.  Respiratory: The patient does not have audible wheezing/stridor; respirations do not appear labored  Gastrointestinal: Abdomen non-distended Musculoskeletal: Normal ROM of UEs  Skin: No obvious rashes/open sores  Neurologic: CN 2-12 grossly intact Psychiatric: Answered questions appropriately  Hematologic/Lymphatic/Immunologic: No obvious bruises or sites of spontaneous bleeding  UA: negative  PVR: 0 ml  ASSESSMENT Nephrolithiasis - Plan: Urinalysis, Routine w reflex microscopic, US RENAL  Benign prostatic hyperplasia with post-void dribbling - Plan: Urinalysis, Routine w reflex microscopic, BLADDER SCAN AMB NON-IMAGING  Lower urinary tract symptoms (LUTS) - Plan: Urinalysis, Routine w reflex microscopic, BLADDER SCAN AMB NON-IMAGING  Hyperuricosuria - Plan: US RENAL  Caffeine use  Urinary frequency  1. Kidney stones. - Advised repeat 24 hour urine study to re-evaluate uricosuria now that he has been on Allopurinol for several months. He would like to repeat that in a few months after he's had more time to reduce his soda / caffeine use to see if that also helps to optimally decreased his metabolic stone risk. - In addition to hyperuricosuria we discussed other suspected contributing factors for his kidney stone disease such as dehydration related to his chronic diarrhea and malabsorption / electrolyte  abnormalities related to his prior gastric bypass.   2. BPH.  - Flomax seems to be helpful; normal PVR.  - He was reassured that his PSA was normal last year therefore his prostate cancer risk is considered to be quite low.  3. LUTS / OAB (urinary frequency / urgency / terminal dribbling). We discussed the  symptoms of overactive bladder (OAB), which include urinary urgency, frequency, nocturia, with or without urge incontinence. While we may not know the exact etiology of OAB, several risk factors can be identified. Likely exacerbated by his significant caffeine intake.   We discussed the following management options in detail including potential benefits, risks, and side effects: Behavioral therapy: Decreasing caffeine intake Bladder retraining / timed voiding Double voiding Medication(s)  He decided to proceed with work on minimizing caffeine intake.    Will plan to follow up with Dr. Ronne Binning in 3 months with repeat 24 hour urine study and RUS prior. Pt verbalized understanding and agreement. All questions were answered.  PLAN Advised the following: Work on gradually minimizing caffeine intake. Maintain adequate fluid intake daily (primarily water). Continue Allopurinol 300 mg daily.  Continue Flomax 0.4 mg daily. Return in about 3 months (around 04/28/2023) for f/u with Dr. Ronne Binning with RUS & repeat 24 hour urine study 2 weeks prior.  Orders Placed This Encounter  Procedures   US RENAL    Standing Status:   Future    Standing Expiration Date:   01/26/2024    Order Specific Question:   Reason for Exam (SYMPTOM  OR DIAGNOSIS REQUIRED)    Answer:   kidney stone known or suspected    Order Specific Question:   Preferred imaging location?    Answer:   [facility]   Urinalysis, Routine w reflex microscopic   BLADDER SCAN AMB NON-IMAGING    It has been explained that the patient is to follow regularly with their PCP in addition to all other providers involved in their  care and to follow instructions provided by these respective offices. Patient advised to contact urology clinic if any urologic-pertaining questions, concerns, new symptoms or problems arise in the interim period.  There are no Patient Instructions on file for this visit.  Electronically signed by:  Donnita Falls, MSN, FNP-C, CUNP 01/26/2023 1:04 PM

## 2023-01-26 ENCOUNTER — Ambulatory Visit: Payer: Federal, State, Local not specified - PPO | Admitting: Urology

## 2023-01-26 ENCOUNTER — Encounter: Payer: Self-pay | Admitting: Urology

## 2023-01-26 VITALS — BP 117/69 | HR 71 | Temp 98.2°F

## 2023-01-26 DIAGNOSIS — N200 Calculus of kidney: Secondary | ICD-10-CM

## 2023-01-26 DIAGNOSIS — R82993 Hyperuricosuria: Secondary | ICD-10-CM

## 2023-01-26 DIAGNOSIS — E559 Vitamin D deficiency, unspecified: Secondary | ICD-10-CM | POA: Insufficient documentation

## 2023-01-26 DIAGNOSIS — N401 Enlarged prostate with lower urinary tract symptoms: Secondary | ICD-10-CM

## 2023-01-26 DIAGNOSIS — F419 Anxiety disorder, unspecified: Secondary | ICD-10-CM | POA: Insufficient documentation

## 2023-01-26 DIAGNOSIS — N138 Other obstructive and reflux uropathy: Secondary | ICD-10-CM

## 2023-01-26 DIAGNOSIS — R399 Unspecified symptoms and signs involving the genitourinary system: Secondary | ICD-10-CM | POA: Diagnosis not present

## 2023-01-26 DIAGNOSIS — N3943 Post-void dribbling: Secondary | ICD-10-CM

## 2023-01-26 DIAGNOSIS — I10 Essential (primary) hypertension: Secondary | ICD-10-CM | POA: Insufficient documentation

## 2023-01-26 DIAGNOSIS — F32A Depression, unspecified: Secondary | ICD-10-CM | POA: Insufficient documentation

## 2023-01-26 DIAGNOSIS — R350 Frequency of micturition: Secondary | ICD-10-CM

## 2023-01-26 DIAGNOSIS — Z789 Other specified health status: Secondary | ICD-10-CM

## 2023-01-26 LAB — URINALYSIS, ROUTINE W REFLEX MICROSCOPIC
Bilirubin, UA: NEGATIVE
Glucose, UA: NEGATIVE
Ketones, UA: NEGATIVE
Leukocytes,UA: NEGATIVE
Nitrite, UA: NEGATIVE
Protein,UA: NEGATIVE
RBC, UA: NEGATIVE
Specific Gravity, UA: 1.03 (ref 1.005–1.030)
Urobilinogen, Ur: 1 mg/dL (ref 0.2–1.0)
pH, UA: 6 (ref 5.0–7.5)

## 2023-01-26 NOTE — Progress Notes (Signed)
post void residual=0 ?

## 2023-02-22 ENCOUNTER — Other Ambulatory Visit (INDEPENDENT_AMBULATORY_CARE_PROVIDER_SITE_OTHER): Payer: Federal, State, Local not specified - PPO

## 2023-02-22 ENCOUNTER — Encounter: Payer: Self-pay | Admitting: Orthopedic Surgery

## 2023-02-22 ENCOUNTER — Ambulatory Visit: Payer: Federal, State, Local not specified - PPO | Admitting: Orthopedic Surgery

## 2023-02-22 ENCOUNTER — Other Ambulatory Visit: Payer: Self-pay

## 2023-02-22 VITALS — BP 111/71 | HR 57 | Ht 61.0 in | Wt 242.0 lb

## 2023-02-22 DIAGNOSIS — Z96652 Presence of left artificial knee joint: Secondary | ICD-10-CM

## 2023-02-22 DIAGNOSIS — G8929 Other chronic pain: Secondary | ICD-10-CM

## 2023-02-22 DIAGNOSIS — Z96651 Presence of right artificial knee joint: Secondary | ICD-10-CM

## 2023-02-22 DIAGNOSIS — M25561 Pain in right knee: Secondary | ICD-10-CM

## 2023-02-22 NOTE — Progress Notes (Signed)
   VISIT TYPE: FOLLOW UP   Chief Complaint  Patient presents with   Knee Pain    FU Tka left knee and chronic pain of the right knee     Encounter Diagnoses  Name Primary?   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  Range of Motion  Extension:  normal  Right knee flexion: 125.   Tests  Drawer:  Anterior - negative    Posterior - negative   Left Knee Exam  Left knee exam is normal.  Range of Motion  Extension:  normal  Left knee flexion: 125.   Tests  Drawer:  Anterior - negative     Posterior - negative  Other  Erythema: absent Effusion: effusion present  Comments:  Small joint effusion  Normal gait       Imaging right total knee stable implant;   left total knee stable implant  A/P Encounter Diagnoses  Name Primary?   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

## 2023-02-23 ENCOUNTER — Other Ambulatory Visit: Payer: Self-pay | Admitting: Cardiovascular Disease

## 2023-02-24 ENCOUNTER — Other Ambulatory Visit: Payer: Self-pay | Admitting: Cardiovascular Disease

## 2023-03-25 ENCOUNTER — Other Ambulatory Visit: Payer: Self-pay | Admitting: Cardiovascular Disease

## 2023-03-26 DIAGNOSIS — E038 Other specified hypothyroidism: Secondary | ICD-10-CM | POA: Diagnosis not present

## 2023-03-26 DIAGNOSIS — E7849 Other hyperlipidemia: Secondary | ICD-10-CM | POA: Diagnosis not present

## 2023-03-26 DIAGNOSIS — E559 Vitamin D deficiency, unspecified: Secondary | ICD-10-CM | POA: Diagnosis not present

## 2023-03-26 DIAGNOSIS — R7301 Impaired fasting glucose: Secondary | ICD-10-CM | POA: Diagnosis not present

## 2023-03-27 LAB — LIPID PANEL
Chol/HDL Ratio: 2.6 {ratio} (ref 0.0–5.0)
Cholesterol, Total: 166 mg/dL (ref 100–199)
HDL: 65 mg/dL (ref >39)
LDL Chol Calc (NIH): 89 mg/dL (ref 0–99)
Triglycerides: 61 mg/dL (ref 0–149)
VLDL Cholesterol Cal: 12 mg/dL (ref 5–40)

## 2023-03-27 LAB — CMP14+EGFR
ALT: 14 [IU]/L (ref 0–44)
AST: 16 [IU]/L (ref 0–40)
Albumin: 4.5 g/dL (ref 3.8–4.9)
Alkaline Phosphatase: 106 [IU]/L (ref 44–121)
BUN/Creatinine Ratio: 13 (ref 10–24)
BUN: 13 mg/dL (ref 8–27)
Bilirubin Total: 0.7 mg/dL (ref 0.0–1.2)
CO2: 24 mmol/L (ref 20–29)
Calcium: 9.4 mg/dL (ref 8.6–10.2)
Chloride: 103 mmol/L (ref 96–106)
Creatinine, Ser: 1.03 mg/dL (ref 0.76–1.27)
Globulin, Total: 2.1 g/dL (ref 1.5–4.5)
Glucose: 108 mg/dL — ABNORMAL HIGH (ref 70–99)
Potassium: 4.9 mmol/L (ref 3.5–5.2)
Sodium: 143 mmol/L (ref 134–144)
Total Protein: 6.6 g/dL (ref 6.0–8.5)
eGFR: 83 mL/min/{1.73_m2} (ref >59)

## 2023-03-27 LAB — CBC WITH DIFFERENTIAL/PLATELET
Basophils Absolute: 0.1 10*3/uL (ref 0.0–0.2)
Basos: 1 %
EOS (ABSOLUTE): 0.2 10*3/uL (ref 0.0–0.4)
Eos: 4 %
Hematocrit: 43.9 % (ref 37.5–51.0)
Hemoglobin: 15.1 g/dL (ref 13.0–17.7)
Immature Grans (Abs): 0 10*3/uL (ref 0.0–0.1)
Immature Granulocytes: 0 %
Lymphocytes Absolute: 0.8 10*3/uL (ref 0.7–3.1)
Lymphs: 14 %
MCH: 30.6 pg (ref 26.6–33.0)
MCHC: 34.4 g/dL (ref 31.5–35.7)
MCV: 89 fL (ref 79–97)
Monocytes Absolute: 0.5 10*3/uL (ref 0.1–0.9)
Monocytes: 9 %
Neutrophils Absolute: 3.8 10*3/uL (ref 1.4–7.0)
Neutrophils: 72 %
Platelets: 171 10*3/uL (ref 150–450)
RBC: 4.93 x10E6/uL (ref 4.14–5.80)
RDW: 13.1 % (ref 11.6–15.4)
WBC: 5.3 10*3/uL (ref 3.4–10.8)

## 2023-03-27 LAB — VITAMIN D 25 HYDROXY (VIT D DEFICIENCY, FRACTURES): Vit D, 25-Hydroxy: 26.7 ng/mL — ABNORMAL LOW (ref 30.0–100.0)

## 2023-03-27 LAB — TSH+FREE T4
Free T4: 1.1 ng/dL (ref 0.82–1.77)
TSH: 2.4 u[IU]/mL (ref 0.450–4.500)

## 2023-03-27 LAB — HEMOGLOBIN A1C
Est. average glucose Bld gHb Est-mCnc: 105 mg/dL
Hgb A1c MFr Bld: 5.3 % (ref 4.8–5.6)

## 2023-04-01 ENCOUNTER — Encounter: Payer: Self-pay | Admitting: Family Medicine

## 2023-04-01 ENCOUNTER — Telehealth (INDEPENDENT_AMBULATORY_CARE_PROVIDER_SITE_OTHER): Payer: Federal, State, Local not specified - PPO | Admitting: Family Medicine

## 2023-04-01 VITALS — Ht 61.0 in

## 2023-04-01 DIAGNOSIS — E162 Hypoglycemia, unspecified: Secondary | ICD-10-CM | POA: Diagnosis not present

## 2023-04-01 DIAGNOSIS — G894 Chronic pain syndrome: Secondary | ICD-10-CM

## 2023-04-01 MED ORDER — CYCLOBENZAPRINE HCL 5 MG PO TABS: 5.0000 mg | ORAL_TABLET | Freq: Every evening | ORAL | 1 refills | Status: DC | PRN

## 2023-04-01 NOTE — Progress Notes (Signed)
 Virtual Visit via Video Note  I connected with William Farley on 04/01/23 at  8:40 AM EST by a video enabled telemedicine application and verified that I am speaking with the correct person using two identifiers.  Patient Location: Skilled Nursing Facility Provider Location: Home Office  I discussed the limitations, risks, security, and privacy concerns of performing an evaluation and management service by video and the availability of in person appointments. I also discussed with the patient that there may be a patient responsible charge related to this service. The patient expressed understanding and agreed to proceed.  Subjective: PCP: Tashara Suder, FNP  Chief Complaint  Patient presents with   Care Management    4 month f/u, pt reports massive back pain , pain level is 7 out of 10. Also having blood sugar issues, it has been dropping has been readings as low as  40.    HPI  The patient complains of hypoglycemic spells that usually last about 20 minutes and are self-limiting without intervention. He reports that the symptoms have been ongoing for about six months. These episodes occur intermittently, with blood sugar dropping into the 40s.The patient states that he spoke with his [facility] provider, who prescribed glucose tablets. However, he admits to not taking the glucose tablets when his blood sugar drops, indicating that intervention is usually not warranted as the levels typically self-resolve.Of note, the patient does not have a history of diabetes and admits to poor caloric intake due to chronic back pain. He also reports a sedentary lifestyle due to increased lower back pain.He last followed up with pain management two years ago and was receiving injections in his lower back; however, he has not established care with pain management since then.The patient currently takes lorazepam  and zolpidem . He rates his back pain as 7/10, with no recent injury or trauma reported. No bowel or  bladder incontinence noted       No data to display          Monthse   Eposides for quite sometime  ROS: Per HPI  Current Outpatient Medications:    allopurinol  (ZYLOPRIM ) 300 MG tablet, Take 1 tablet (300 mg total) by mouth daily., Disp: 30 tablet, Rfl: 11   Cholecalciferol (VITAMIN D3 PO), Take 7,500 mcg by mouth daily., Disp: , Rfl:    Cyanocobalamin  (VITAMIN B12 PO), Take 1 tablet by mouth every other day., Disp: , Rfl:    cyclobenzaprine  (FLEXERIL ) 5 MG tablet, Take 1 tablet (5 mg total) by mouth at bedtime as needed., Disp: 30 tablet, Rfl: 1   DULoxetine  (CYMBALTA ) 60 MG capsule, Take 60 mg by mouth 2 (two) times daily., Disp: , Rfl:    LORazepam  (ATIVAN ) 1 MG tablet, Take 1 mg by mouth 3 (three) times daily as needed for anxiety., Disp: , Rfl:    metoprolol  succinate (TOPROL -XL) 25 MG 24 hr tablet, TAKE 1 TABLET(25 MG) BY MOUTH DAILY, Disp: 15 tablet, Rfl: 0   NON FORMULARY, Pt uses a cpap nightly, Disp: , Rfl:    omeprazole (PRILOSEC) 20 MG capsule, Take 20 mg by mouth in the morning., Disp: , Rfl:    tamsulosin  (FLOMAX ) 0.4 MG CAPS capsule, Take 1 capsule (0.4 mg total) by mouth daily after supper., Disp: 30 capsule, Rfl: 11   zolpidem  (AMBIEN ) 10 MG tablet, Take 10 mg by mouth at bedtime., Disp: , Rfl:    dicyclomine  (BENTYL ) 10 MG capsule, Take 1 capsule (10 mg total) by mouth 4 (four) times daily -  before meals and at bedtime., Disp: 120 capsule, Rfl: 11   ibuprofen  (ADVIL ) 800 MG tablet, Take 1 tablet (800 mg total) by mouth every 8 (eight) hours as needed., Disp: 90 tablet, Rfl: 1   lipase/protease/amylase (CREON ) 36000 UNITS CPEP capsule, Take 2 capsules (72,000 Units total) by mouth 3 (three) times daily with meals. May also take 1 capsule (36,000 Units total) as needed (with snacks)., Disp: 240 capsule, Rfl: 11   loperamide  (IMODIUM  A-D) 2 MG tablet, Take 1 tablet (2 mg total) by mouth 4 (four) times daily as needed for diarrhea or loose stools. (Patient not taking:  Reported on 01/26/2023), Disp: 30 tablet, Rfl: 0  Observations/Objective: Today's Vitals   04/01/23 0828  Height: 5' 1 (1.549 m)   Physical Exam Head is normocephalic, atraumatic.  No labored breathing.  Speech is clear and coherent with logical content.  Patient is alert and oriented at baseline.   Assessment and Plan: Hypoglycemia -     Ambulatory referral to Endocrinology  Chronic pain syndrome -     Ambulatory referral to Pain Clinic -     Cyclobenzaprine  HCl; Take 1 tablet (5 mg total) by mouth at bedtime as needed.  Dispense: 30 tablet; Refill: 1  Encourage a high-calorie, high-protein diet to support weight gain and improve nutrition. Eat 5 to 6 small meals a day instead of 3 large meals. This can help you eat more frequently and avoid feeling overly full. Keep easy-to-eat snacks around and try to eat often, even if you don't feel hungry. Drink high-protein nutritional supplement drinks (e.g., Ensure, Boost, Carnation Instant Breakfast) to add extra calories and protein to your diet  Non-pharmacological interventions were discussed for the management of low back pain. The patient is encouraged to continue taking duloxetine , and a prescription for Flexeril  has been sent to his pharmacy.   Follow Up Instructions: No follow-ups on file.   I discussed the assessment and treatment plan with the patient. The patient was provided an opportunity to ask questions, and all were answered. The patient agreed with the plan and demonstrated an understanding of the instructions.   The patient was advised to call back or seek an in-person evaluation if the symptoms worsen or if the condition fails to improve as anticipated.  The above assessment and management plan was discussed with the patient. The patient verbalized understanding of and has agreed to the management plan.   Dalayza Zambrana, FNP

## 2023-04-02 ENCOUNTER — Ambulatory Visit: Payer: Federal, State, Local not specified - PPO | Admitting: Family Medicine

## 2023-04-03 ENCOUNTER — Other Ambulatory Visit: Payer: Self-pay | Admitting: Family Medicine

## 2023-04-03 DIAGNOSIS — E559 Vitamin D deficiency, unspecified: Secondary | ICD-10-CM

## 2023-04-03 MED ORDER — VITAMIN D (ERGOCALCIFEROL) 1.25 MG (50000 UNIT) PO CAPS: 50000.0000 [IU] | ORAL_CAPSULE | ORAL | 1 refills | Status: DC

## 2023-04-05 ENCOUNTER — Other Ambulatory Visit: Payer: Self-pay | Admitting: Urology

## 2023-04-08 ENCOUNTER — Ambulatory Visit (INDEPENDENT_AMBULATORY_CARE_PROVIDER_SITE_OTHER): Payer: Federal, State, Local not specified - PPO | Admitting: Orthopaedic Surgery

## 2023-04-08 ENCOUNTER — Encounter: Payer: Self-pay | Admitting: Orthopaedic Surgery

## 2023-04-08 ENCOUNTER — Other Ambulatory Visit (INDEPENDENT_AMBULATORY_CARE_PROVIDER_SITE_OTHER): Payer: Self-pay

## 2023-04-08 VITALS — BP 138/82 | HR 80

## 2023-04-08 DIAGNOSIS — M545 Low back pain, unspecified: Secondary | ICD-10-CM

## 2023-04-08 MED ORDER — CYCLOBENZAPRINE HCL 10 MG PO TABS: 10.0000 mg | ORAL_TABLET | Freq: Every day | ORAL | 0 refills | Status: DC

## 2023-04-08 MED ORDER — PREDNISONE 5 MG (21) PO TBPK: ORAL_TABLET | ORAL | 0 refills | Status: DC

## 2023-04-08 NOTE — Progress Notes (Signed)
My back hurts.  He has a long history of lower back pain.  He has been seen at the [facility] for this multiple times.  He has had injections to the back with little help.  He has been to pain clinic with little results.  About five or six months ago his pain has gotten worse.  He denies any trauma, no weakness, no numbness.  The pain is localized to the mid lower back.  He has not had any X-rays recently or MRI.  He saw his family doctor just recently and is being referred to another pain clinic.  Dr. Romeo Apple did total knee on him last year and he wanted our opinion.  He is tired of hurting.  Lower back is tender in the mid portion and more to the right.  NV intact.  SLR negative.  ROM is very limited secondary to pain.  He has no spasm.  Gait is good.  Muscle tone and strength are normal.  Encounter Diagnosis  Name Primary?   Lumbar pain Yes   X-rays were done of the lumbar spine, reported separately.  I will get MRI.  He is taking no pain medicine.  He says it does not help.  He is taking Flexeril 5 and I will increase to 10 mgm at bedtime.  I will give prednisone dose pack.  I will schedule for MRI of the lumbar spine.  Return in one week.  Call if any problem.  Precautions discussed.  Electronically Signed Darreld Mclean, MD 1/16/20259:20 AM

## 2023-04-08 NOTE — Patient Instructions (Signed)
 Hypoglycemia Hypoglycemia is when the amount of sugar, or glucose, in your blood is too low. Low blood sugar can happen if you have diabetes or if you don't have diabetes. It may be an emergency. What are the causes? Low blood sugar happens most often in people who have diabetes. It may be caused by: Diabetes medicine. Not eating enough, or not eating often enough. Being more active than normal. If you don't have diabetes, you may still get low blood sugar if: There's a tumor in your pancreas. A tumor is a growth of cells that isn't normal. You don't eat enough, or you fast. Fasting is when you don't eat for long periods at a time. You have a bad infection or illness. You have problems after weight loss surgery. You have kidney or liver problems. You take certain medicines. What increases the risk? You're more likely to have low blood sugar if: You have diabetes and take medicine for it. You drink a lot of alcohol. You get sick. What are the signs or symptoms? Mild Hunger or feeling like you may vomit. Sweating and feeling cold to the touch. Feeling dizzy or light-headed. Being sleepy or having trouble sleeping. A headache. Blurry vision. Mood changes. These include feeling worried, nervous, or easily annoyed. Moderate Feeling confused. Changes in the way you act. Weakness. An uneven heartbeat. Very bad Having very low blood sugar is an emergency. It can cause: Fainting. Seizures. A coma. Death. How is this diagnosed?  Low blood sugar can be found with a blood test. This test tells you how much sugar is in your blood. It's done while you're having symptoms. Your health care provider may also do an exam and look at your medical history. How is this treated? Treating low blood sugar If you have low blood sugar, eat or drink something with sugar in it right away. The food or drink should have 15 grams of a fast-acting carbohydrate (carb). Options include: 4 oz (120 mL) of  fruit juice. 4 oz (120 mL) of soda (not diet soda). A few pieces of hard candy. Check food labels to see how many pieces to eat. 1 Tbsp (15 mL) of sugar or honey. 4 glucose tablets. 1 tube of glucose gel. Treating low blood sugar if you have diabetes Talk with your provider about how much carb you should take. If you're alert and can swallow safely, you may follow the 15:15 rule: Take 15 grams of a fast-acting carb. Check your blood sugar 15 minutes after you take the carb. If your blood sugar is still at or below 70 mg/dL (3.9 mmol/L), take 15 grams of a carb again. If your blood sugar doesn't go above 70 mg/dL (3.9 mmol/L) after 3 tries, get help right away. After your blood sugar goes back to normal, eat a meal or a snack within 1 hour. Always keep 15 grams of a fast-acting carb with you. This could be: 4 glucose tablets. A few pieces of hard candy. 1 Tbsp (15 mL) of honey or sugar. 1 tube of glucose gel. Treating very low blood sugar If your blood sugar is less than 54 mg/dL (3 mmol/L), it's an emergency. Get help right away. If you can't eat or drink, you will need to be given glucagon. A family member or friend should learn how to check your blood sugar and give you glucagon. Ask your provider if you should keep a glucagon kit at home. You may also need to be treated in a hospital. Follow  these instructions at home: If you have diabetes: Always keep a fast-acting carb (15 grams) with you. Follow your diabetes care plan. Make sure you: Know the symptoms of low blood sugar. Check your blood sugar as often as told. Always check it before and after you exercise. Always check your blood sugar before you drive. Take your medicines as told. Eat on time. Do not skip meals. Share your diabetes care plan with: Your work or school. The people you live with. Wear an alert bracelet or carry a card that says you have diabetes. General instructions If you drink alcohol: Limit how much you  have to: 0-1 drink a day if you're male. 0-2 drinks a day if you're male. Know how much alcohol is in your drink. In the [place] one drink is one 12 oz bottle of beer (355 mL), one 5 oz glass of wine (148 mL), or one 1 oz glass of hard liquor (44 mL). Be sure to eat food when you drink alcohol. Be sure to check your blood sugar after you drink. Alcohol may lead to low blood sugar later. Where to find more information American Diabetes Association (ADA): diabetes.org Contact a health care provider if: You have low blood sugar often. You have diabetes and are having trouble keeping your blood sugar in the right range. Get help right away if: You can't get your blood sugar above 70 mg/dL (3.9 mmol/L) after 3 tries. Your blood sugar is below 54 mg/dL (3 mmol/L). You have a seizure. You faint. These symptoms may be an emergency. Call 911 right away. Do not wait to see if the symptoms will go away. Do not drive yourself to the hospital. This information is not intended to replace advice given to you by your health care provider. Make sure you discuss any questions you have with your health care provider. Document Revised: 05/27/2022 Document Reviewed: 05/27/2022 Elsevier Patient Education  2024 [employer].

## 2023-04-09 ENCOUNTER — Other Ambulatory Visit: Payer: Self-pay | Admitting: Cardiovascular Disease

## 2023-04-09 ENCOUNTER — Encounter: Payer: Self-pay | Admitting: Orthopaedic Surgery

## 2023-04-09 ENCOUNTER — Ambulatory Visit: Payer: Federal, State, Local not specified - PPO | Admitting: Nurse Practitioner

## 2023-04-09 ENCOUNTER — Encounter: Payer: Self-pay | Admitting: Nurse Practitioner

## 2023-04-09 VITALS — BP 98/64 | HR 71 | Ht 71.0 in | Wt 224.0 lb

## 2023-04-09 DIAGNOSIS — E162 Hypoglycemia, unspecified: Secondary | ICD-10-CM

## 2023-04-09 MED ORDER — DEXCOM G7 SENSOR MISC: 1.0000 | 3 refills | Status: DC

## 2023-04-09 NOTE — Progress Notes (Signed)
Endocrinology Consult Note                                            04/09/2023, 10:39 AM   Subjective:    Patient ID: William Farley, male    DOB: 1960-12-28, PCP Gilmore Laroche, FNP   Past Medical History:  Diagnosis Date   Anxiety    Arthritis    CAD (coronary artery disease)    Minimal Non-obstructive CAD seen on coronary CTA 04/2021   Depression    GERD (gastroesophageal reflux disease)    History of kidney stones    History of thrombocytopenia    Hx of syncope    PAF (paroxysmal atrial fibrillation) (HCC)    ChadsVasc Score is 0.  Monitor revealed less than 1% PAF burden.   Seizure (HCC) 02/2021   Sleep apnea    wears CPAP    Past Surgical History:  Procedure Laterality Date   APPENDECTOMY     BIOPSY  05/01/2021   Procedure: BIOPSY;  Surgeon: Malissa Hippo, MD;  [facility]: [facility];  Service: Endoscopy;;   CERVICAL SPINE SURGERY  2018   CHOLECYSTECTOMY     COLONOSCOPY WITH PROPOFOL N/A 05/01/2021   Procedure: COLONOSCOPY WITH PROPOFOL;  Surgeon: Malissa Hippo, MD;  Location: [facility];  Service: Endoscopy;  Laterality: N/A;  1050   CYST EXCISION     CYSTOSCOPY     CYSTOSCOPY WITH RETROGRADE PYELOGRAM, URETEROSCOPY AND STENT PLACEMENT Right 08/08/2020   Procedure: CYSTOSCOPY WITH RIGHT RETROGRADE PYELOGRAM AND RIGHT URETEROSCOPY;  Surgeon: Malen Gauze, MD;  Location: [facility];  Service: Urology;  Laterality: Right;   EXTRACORPOREAL SHOCK WAVE LITHOTRIPSY Right 05/21/2020   Procedure: EXTRACORPOREAL SHOCK WAVE LITHOTRIPSY (ESWL);  Surgeon: Malen Gauze, MD;  Location: [facility];  Service: Urology;  Laterality: Right;   GASTRIC BYPASS OPEN  2003   INCISIONAL HERNIA REPAIR     kidney stone removal     KNEE ARTHROSCOPY Left    multiple   NECK SURGERY     POLYPECTOMY  05/01/2021   Procedure: POLYPECTOMY;  Surgeon: Malissa Hippo, MD;  [facility];  Service: Endoscopy;;   REPLACEMENT TOTAL KNEE Right    RHINOPLASTY     STONE  EXTRACTION WITH BASKET Right 08/08/2020   Procedure: STONE EXTRACTION WITH BASKET;  Surgeon: Malen Gauze, MD;  Location: [facility];  Service: Urology;  Laterality: Right;   TOTAL KNEE ARTHROPLASTY Left 03/10/2022   Procedure: TOTAL KNEE ARTHROPLASTY;  Surgeon: Vickki Hearing, MD;  Location: [facility];  Service: Orthopedics;  Laterality: Left;   Social History   Socioeconomic History   Marital status: Married    Spouse name: Not on file   Number of children: Not on file   Years of education: Not on file   Highest education level: Associate degree: occupational, Scientist, product/process development, or vocational program  Occupational History   Occupation: Economist  Tobacco Use   Smoking status: Former    Current packs/day: 0.00    Average packs/day: 0.5 packs/day for 10.0 years (5.0 ttl pk-yrs)    Types: Cigarettes    Start date: 05/20/1986    Quit date: 05/20/1996    Years since quitting: 26.9    Passive exposure: Past   Smokeless tobacco: Never  Vaping Use   Vaping status: Never Used  Substance and Sexual Activity   Alcohol use: Not Currently   Drug use: Yes    Types: Marijuana    Comment: 1 or 2 x weeks   Sexual activity: Yes  Other Topics Concern   Not on file  Social History Narrative   Right handed    Married wife   One story home   Social Drivers of Health   Financial Resource Strain: Low Risk  (08/30/2022)   Overall Financial Resource Strain (CARDIA)    Difficulty of Paying Living Expenses: Not hard at all  Food Insecurity: No Food Insecurity (08/30/2022)   Hunger Vital Sign    Worried About Running Out of Food in the Last Year: Never true    Ran Out of Food in the Last Year: Never true  Transportation Needs: No Transportation Needs (08/30/2022)   PRAPARE - Administrator, Civil Service (Medical): No    Lack of Transportation (Non-Medical): No  Physical Activity: Insufficiently Active (08/30/2022)   Exercise Vital Sign    Days of Exercise per Week: 3 days     Minutes of Exercise per Session: 10 min  Stress: Stress Concern Present (08/30/2022)   [employer] of Occupational Health - Occupational Stress Questionnaire    Feeling of Stress : Very much  Social Connections: Moderately Integrated (08/30/2022)   Social Connection and Isolation Panel [NHANES]    Frequency of Communication with Friends and Family: Twice a week    Frequency of Social Gatherings with Friends and Family: Three times a week    Attends Religious Services: Never    Active Member of Clubs or Organizations: Yes    Attends Engineer, structural: More than 4 times per year    Marital Status: Married   Family History  Problem Relation Age of Onset   Heart disease Father 78   Kidney failure Father    Colon cancer Neg Hx    Outpatient Encounter Medications as of 04/09/2023  Medication Sig   allopurinol (ZYLOPRIM) 300 MG tablet Take 1 tablet (300 mg total) by mouth daily.   Cholecalciferol (VITAMIN D3 PO) Take 7,500 mcg by mouth daily.   Continuous Glucose Sensor (DEXCOM G7 SENSOR) MISC Inject 1 Application into the skin as directed. Change sensor every 10 days as directed.   Cyanocobalamin (VITAMIN B12 PO) Take 1 tablet by mouth every other day.   cyclobenzaprine (FLEXERIL) 10 MG tablet Take 1 tablet (10 mg total) by mouth at bedtime. One tablet every night at bedtime as needed for spasm.   DULoxetine (CYMBALTA) 60 MG capsule Take 60 mg by mouth 2 (two) times daily.   LORazepam (ATIVAN) 1 MG tablet Take 1 mg by mouth 3 (three) times daily as needed for anxiety.   metoprolol succinate (TOPROL-XL) 25 MG 24 hr tablet TAKE 1 TABLET(25 MG) BY MOUTH DAILY   NON FORMULARY Pt uses a cpap nightly   omeprazole (PRILOSEC) 20 MG capsule Take 20 mg by mouth in the morning.   predniSONE (STERAPRED UNI-PAK 21 TAB) 5 MG (21) TBPK tablet Take 6 pills first day; 5 pills second day; 4 pills third day; 3 pills fourth day; 2 pills next day and 1 pill last day.   tamsulosin (FLOMAX) 0.4 MG  CAPS capsule Take 1 capsule (0.4 mg total) by mouth daily after supper.   Vitamin D, Ergocalciferol, (DRISDOL) 1.25 MG (50000 UNIT) CAPS capsule Take 1 capsule (50,000 Units total) by mouth every 7 (seven) days.   zolpidem (AMBIEN) 10 MG tablet Take  10 mg by mouth at bedtime.   cyclobenzaprine (FLEXERIL) 5 MG tablet Take 1 tablet (5 mg total) by mouth at bedtime as needed. (Patient not taking: Reported on 04/09/2023)   [DISCONTINUED] dicyclomine (BENTYL) 10 MG capsule Take 1 capsule (10 mg total) by mouth 4 (four) times daily -  before meals and at bedtime.   [DISCONTINUED] ibuprofen (ADVIL) 800 MG tablet Take 1 tablet (800 mg total) by mouth every 8 (eight) hours as needed.   [DISCONTINUED] lipase/protease/amylase (CREON) 36000 UNITS CPEP capsule Take 2 capsules (72,000 Units total) by mouth 3 (three) times daily with meals. May also take 1 capsule (36,000 Units total) as needed (with snacks).   [DISCONTINUED] loperamide (IMODIUM A-D) 2 MG tablet Take 1 tablet (2 mg total) by mouth 4 (four) times daily as needed for diarrhea or loose stools. (Patient not taking: Reported on 01/26/2023)   No facility-administered encounter medications on file as of 04/09/2023.   ALLERGIES: Allergies  Allergen Reactions   Codeine Itching, Rash and Other (See Comments)    VACCINATION STATUS: Immunization History  Administered Date(s) Administered   DTaP 08/18/2014   Influenza Inj Mdck Quad Pf 01/02/2016, 06/14/2017, 03/07/2021   Influenza, Seasonal, Injecte, Preservative Fre 12/01/2022   Influenza-Unspecified 12/24/2014, 01/22/2019, 01/15/2020, 02/21/2020, 03/23/2021, 11/21/2021, 01/28/2022   Janssen (J&J) SARS-COV-2 Vaccination 05/29/2019   Moderna Covid-19 Fall Seasonal Vaccine 78yrs & older 12/28/2022   Moderna Covid-19 Vaccine Bivalent Booster 76yrs & up 02/21/2020, 01/27/2021   Moderna Sars-Covid-2 Vaccination 01/15/2020   Pneumococcal Polysaccharide-23 10/02/2014   Pneumococcal-Unspecified 10/02/2014    Tdap 10/02/2014, 01/22/2020, 12/04/2021   Zoster Recombinant(Shingrix) 03/23/2021, 05/05/2021    HPI ANTWOIN SAWAYA is 63 y.o. male who presents today with a medical history as above. he is being seen in consultation for hypoglycemia requested by Gilmore Laroche, FNP.  he has been dealing with symptoms of hypoglycemia for approximately 6 months.  He notes episodes typically last about 20-30 minutes and self-corrects with time.  There is pattern to his low glucose levels.  He was given a glucometer by the [place] and has checked fasting levels as well as times he had symptoms of low glucose.  Fasting glucose ranges between the 90-100 range.  His hypoglycemic events have been 40-60 range.  Per the referral, it states he does not have much of an appetite due to severe back pain, so it is possible that his hypoglycemia is related to malnutrition.    He has history of IBS, also history of gastric bypass surgery more than 10 years ago.  He drinks Coke Zero drinks or SF grape flavoring for his water.  He does not have a routine pattern with eating but admits he is a picky eater.   He also has a history of heavy alcohol abuse (quit over 8 years ago)- denies any hx of pancreatitis.  He does admit to using THC to help calm him.  Review of systems  Constitutional: +rapidly decreasing body weight,  current Body mass index is 31.24 kg/m. , no fatigue, no subjective hyperthermia, no subjective hypothermia Eyes: no blurry vision, no xerophthalmia ENT: no sore throat, no nodules palpated in throat, no dysphagia/odynophagia, no hoarseness Cardiovascular: no chest pain, no shortness of breath, no palpitations, no leg swelling Respiratory: no cough, no shortness of breath Gastrointestinal: + intermittent nausea Musculoskeletal: + back pain-severe (sees pain specialist) Skin: no rashes, no hyperemia Neurological: no tremors, no numbness, no tingling, no dizziness Psychiatric: hx of PTSD and depression currently  controlled  Objective:  BP Readings from Last 3 Encounters:  04/09/23 98/64  04/08/23 138/82  02/22/23 111/71    BP 98/64 (BP Location: Left Arm, Patient Position: Sitting, Cuff Size: Large)   Pulse 71   Ht 5\' 11"  (1.803 m)   Wt 224 lb (101.6 kg)   BMI 31.24 kg/m   Wt Readings from Last 3 Encounters:  04/09/23 224 lb (101.6 kg)  02/22/23 242 lb (109.8 kg)  12/01/22 236 lb 0.6 oz (107.1 kg)     Physical Exam- Limited  Constitutional:  Body mass index is 31.24 kg/m. , not in acute distress, normal state of mind Eyes:  EOMI, no exophthalmos Musculoskeletal: no gross deformities, strength intact in all four extremities, no gross restriction of joint movements Skin:  no rashes, no hyperemia Neurological: no tremor with outstretched hands  CMP ( most recent) CMP     Component Value Date/Time   NA 143 03/26/2023 0926   K 4.9 03/26/2023 0926   CL 103 03/26/2023 0926   CO2 24 03/26/2023 0926   GLUCOSE 108 (H) 03/26/2023 0926   GLUCOSE 149 (H) 03/11/2022 0430   BUN 13 03/26/2023 0926   CREATININE 1.03 03/26/2023 0926   CALCIUM 9.4 03/26/2023 0926   PROT 6.6 03/26/2023 0926   ALBUMIN 4.5 03/26/2023 0926   AST 16 03/26/2023 0926   ALT 14 03/26/2023 0926   ALKPHOS 106 03/26/2023 0926   BILITOT 0.7 03/26/2023 0926   EGFR 83 03/26/2023 0926   GFRNONAA >60 03/11/2022 0430     Diabetic Labs (most recent): Lab Results  Component Value Date   HGBA1C 5.3 03/26/2023   HGBA1C 5.3 09/01/2022   HGBA1C 5.1 05/26/2022     Lipid Panel ( most recent) Lipid Panel     Component Value Date/Time   CHOL 166 03/26/2023 0926   TRIG 61 03/26/2023 0926   HDL 65 03/26/2023 0926   CHOLHDL 2.6 03/26/2023 0926   LDLCALC 89 03/26/2023 0926   LABVLDL 12 03/26/2023 0926      Lab Results  Component Value Date   TSH 2.400 03/26/2023   TSH 3.190 09/01/2022   TSH 2.110 05/26/2022   FREET4 1.10 03/26/2023   FREET4 1.09 05/26/2022           Assessment & Plan:   1.  Hypoglycemia (Primary)  - William Farley  is being seen at a kind request of Gilmore Laroche, FNP.  - I have reviewed his available records and clinically evaluated the patient.  - Based on these reviews, he has hypoglycemia related to inadequate oral intake vs poor quality foods.  His most recent A1c was 5.3%, eliminating diabetes or prediabetes as a potential cause.  I also reviewed CT of abdomen done 02/2020 which shows normal pancreas, no tumors noted, ruling out insulinoma.  We did discuss the importance of nutrition today.  Of the importance of eating 3 well balanced meals so that the body has the fuel it needs to work properly.    -  Suggestion is made for the patient to avoid simple carbohydrates from their diet including Cakes, Sweet Desserts / Pastries, Ice Cream, Soda (diet and regular), Sweet Tea, Candies, Chips, Cookies, Sweet Pastries, Store Bought Juices, Alcohol in Excess of 1-2 drinks a day, Artificial Sweeteners, Coffee Creamer, and "Sugar-free" Products. This will help patient to have stable blood glucose profile and potentially avoid unintended weight gain.   - I encouraged the patient to switch to unprocessed or minimally processed complex starch and increased protein intake (animal or  plant source), fruits, and vegetables.   - Patient is advised to stick to a routine mealtimes to eat 3 meals a day and avoid unnecessary snacks (to snack only to correct hypoglycemia).  The following Lifestyle Medicine recommendations according to American College of Lifestyle Medicine [facility]) were discussed and offered to patient and he agrees to start the journey:  A. Whole Foods, Plant-based plate comprising of fruits and vegetables, plant-based proteins, whole-grain carbohydrates was discussed in detail with the patient.   A list for source of those nutrients were also provided to the patient.  Patient will use only water or unsweetened tea for hydration. B.  The need to stay away from risky  substances including alcohol, smoking; obtaining 7 to 9 hours of restorative sleep, at least 150 minutes of moderate intensity exercise weekly, the importance of healthy social connections,  and stress reduction techniques were discussed. C.  A full color page of  Calorie density of various food groups per pound showing examples of each food groups was provided to the patient.  I have entered a referral to dietician for assistance.  He does not like most veggies, does not really like beans either.   -Will check insulin, c-peptide level, CMP prior to next visit.   - I did not initiate any new prescriptions today. - he is advised to maintain close follow up with Gilmore Laroche, FNP for primary care needs.   -Thank you for involving me in the care of this pleasant patient.  Time spent with the patient: 60  minutes, of which >50% was spent in  counseling him about his hypoglycemia and the rest in obtaining information about his symptoms, reviewing his previous labs/studies ( including abstractions from other facilities),  evaluations, and treatments,  and developing a plan to confirm diagnosis and long term treatment based on the latest standards of care/guidelines; and documenting his care.  William Farley participated in the discussions, expressed understanding, and voiced agreement with the above plans.  All questions were answered to his satisfaction. he is encouraged to contact clinic should he have any questions or concerns prior to his return visit.  Follow up plan: Return in about 3 months (around 07/08/2023) for hypoglycemia follow up with previsit labs.   Ronny Bacon, [facility] [address] [place], [place] [address] Phone: [phone] Fax: [phone]   04/09/2023, 10:39 AM

## 2023-04-10 LAB — LITHOLINK 24HR URINE PANEL
Ammonium, Urine: 46 mmol/(24.h) (ref 15–60)
Calcium Oxalate Saturation: 5.77 — ABNORMAL LOW (ref 6.00–10.00)
Calcium Phosphate Saturation: 0.14 — ABNORMAL LOW (ref 0.50–2.00)
Calcium, Urine: 149 mg/(24.h) (ref <250)
Calcium/Creatinine Ratio: 76 mg/g{creat} (ref 34–196)
Calcium/Kg Body Weight: 1.5 mg/kg/d (ref <4.0)
Chloride, Urine: 141 mmol/(24.h) (ref 70–250)
Citrate, Urine: 806 mg/(24.h) (ref >450)
Creatinine, Urine: 1950 mg/(24.h)
Creatinine/Kg Body Weight: 19.5 mg/kg/d (ref 11.9–24.4)
Magnesium, Urine: 180 mg/(24.h) — ABNORMAL HIGH (ref 30–120)
Oxalate, Urine: 77 mg/(24.h) — ABNORMAL HIGH (ref 20–40)
Phosphorus, Urine: 1148 mg/(24.h) (ref 600–1200)
Potassium, Urine: 54 mmol/(24.h) (ref 20–100)
Protein Catabolic Rate: 0.9 g/kg/d (ref 0.8–1.4)
Sodium, Urine: 139 mmol/(24.h) (ref 50–150)
Sulfate, Urine: 42 meq/(24.h) (ref 20–80)
Urea Nitrogen, Urine: 11.11 g/(24.h) (ref 6.00–14.00)
Uric Acid Saturation: 0.74 (ref <1.00)
Uric Acid, Urine: 427 mg/(24.h) (ref <800)
Urine Volume (Preserved): 2930 mL/(24.h) (ref 500–4000)
pH, 24 hr, Urine: 5.434 — ABNORMAL LOW (ref 5.800–6.200)

## 2023-04-10 LAB — COMPREHENSIVE METABOLIC PANEL
ALT: 14 [IU]/L (ref 0–44)
AST: 21 [IU]/L (ref 0–40)
Albumin: 4.6 g/dL (ref 3.8–4.9)
Alkaline Phosphatase: 107 [IU]/L (ref 44–121)
BUN/Creatinine Ratio: 10 (ref 10–24)
BUN: 10 mg/dL (ref 8–27)
Bilirubin Total: 0.6 mg/dL (ref 0.0–1.2)
CO2: 23 mmol/L (ref 20–29)
Calcium: 9.5 mg/dL (ref 8.6–10.2)
Chloride: 102 mmol/L (ref 96–106)
Creatinine, Ser: 0.99 mg/dL (ref 0.76–1.27)
Globulin, Total: 2.4 g/dL (ref 1.5–4.5)
Glucose: 93 mg/dL (ref 70–99)
Potassium: 4.4 mmol/L (ref 3.5–5.2)
Sodium: 142 mmol/L (ref 134–144)
Total Protein: 7 g/dL (ref 6.0–8.5)
eGFR: 87 mL/min/{1.73_m2} (ref >59)

## 2023-04-10 LAB — INSULIN AND C-PEPTIDE, SERUM
C-Peptide: 2.7 ng/mL (ref 1.1–4.4)
INSULIN: 10.1 u[IU]/mL (ref 2.6–24.9)

## 2023-04-12 ENCOUNTER — Encounter: Payer: Self-pay | Admitting: Nurse Practitioner

## 2023-04-12 NOTE — Progress Notes (Signed)
FYI: I sent mychart message going over recent labs.

## 2023-04-14 ENCOUNTER — Ambulatory Visit: Payer: Federal, State, Local not specified - PPO | Admitting: Urology

## 2023-04-14 VITALS — BP 107/66 | HR 67

## 2023-04-14 DIAGNOSIS — N200 Calculus of kidney: Secondary | ICD-10-CM

## 2023-04-14 LAB — URINALYSIS, ROUTINE W REFLEX MICROSCOPIC
Bilirubin, UA: NEGATIVE
Glucose, UA: NEGATIVE
Ketones, UA: NEGATIVE
Leukocytes,UA: NEGATIVE
Nitrite, UA: NEGATIVE
Protein,UA: NEGATIVE
RBC, UA: NEGATIVE
Specific Gravity, UA: 1.03 (ref 1.005–1.030)
Urobilinogen, Ur: 1 mg/dL (ref 0.2–1.0)
pH, UA: 6 (ref 5.0–7.5)

## 2023-04-14 MED ORDER — ALLOPURINOL 300 MG PO TABS: 300.0000 mg | ORAL_TABLET | Freq: Every day | ORAL | 3 refills | Status: DC

## 2023-04-14 NOTE — Progress Notes (Signed)
04/14/2023 10:41 AM   William Farley January 14, 1961 [id number]  Referring provider: Gilmore Laroche, FNP [address] [place],  [place] [address]  Followup nephrolithiasis   HPI: [name] is a 63yo here for followup for nephrolithiasis. No stone events since last visit. Repeat 24 hour ruine showed improved volume, decreased uric acid, decrease CaOX saturation, improved pH and increased magnesium. He denies any flank pain   PMH: Past Medical History:  Diagnosis Date   Anxiety    Arthritis    CAD (coronary artery disease)    Minimal Non-obstructive CAD seen on coronary CTA 04/2021   Depression    GERD (gastroesophageal reflux disease)    History of kidney stones    History of thrombocytopenia    Hx of syncope    PAF (paroxysmal atrial fibrillation) (HCC)    ChadsVasc Score is 0.  Monitor revealed less than 1% PAF burden.   Seizure (HCC) 02/2021   Sleep apnea    wears CPAP     Surgical History: Past Surgical History:  Procedure Laterality Date   APPENDECTOMY     BIOPSY  05/01/2021   Procedure: BIOPSY;  Surgeon: Malissa Hippo, MD;  [facility];  Service: Endoscopy;;   CERVICAL SPINE SURGERY  2018   CHOLECYSTECTOMY     COLONOSCOPY WITH PROPOFOL N/A 05/01/2021   Procedure: COLONOSCOPY WITH PROPOFOL;  Surgeon: Malissa Hippo, MD;  [facility];  Service: Endoscopy;  Laterality: N/A;  1050   CYST EXCISION     CYSTOSCOPY     CYSTOSCOPY WITH RETROGRADE PYELOGRAM, URETEROSCOPY AND STENT PLACEMENT Right 08/08/2020   Procedure: CYSTOSCOPY WITH RIGHT RETROGRADE PYELOGRAM AND RIGHT URETEROSCOPY;  Surgeon: Malen Gauze, MD;  Location: [facility];  Service: Urology;  Laterality: Right;   EXTRACORPOREAL SHOCK WAVE LITHOTRIPSY Right 05/21/2020   Procedure: EXTRACORPOREAL SHOCK WAVE LITHOTRIPSY (ESWL);  Surgeon: Malen Gauze, MD;  Location: [facility];  Service: Urology;  Laterality: Right;   GASTRIC BYPASS OPEN  2003   INCISIONAL HERNIA REPAIR      kidney stone removal     KNEE ARTHROSCOPY Left    multiple   NECK SURGERY     POLYPECTOMY  05/01/2021   Procedure: POLYPECTOMY;  Surgeon: Malissa Hippo, MD;  [facility];  Service: Endoscopy;;   REPLACEMENT TOTAL KNEE Right    RHINOPLASTY     STONE EXTRACTION WITH BASKET Right 08/08/2020   Procedure: STONE EXTRACTION WITH BASKET;  Surgeon: Malen Gauze, MD;  Location: [facility];  Service: Urology;  Laterality: Right;   TOTAL KNEE ARTHROPLASTY Left 03/10/2022   Procedure: TOTAL KNEE ARTHROPLASTY;  Surgeon: Vickki Hearing, MD;  Location: [facility];  Service: Orthopedics;  Laterality: Left;    Home Medications:  Allergies as of 04/14/2023       Reactions   Codeine Itching, Rash, Other (See Comments)        Medication List        Accurate as of April 14, 2023 10:41 AM. If you have any questions, ask your nurse or doctor.          allopurinol 300 MG tablet Commonly known as: ZYLOPRIM Take 1 tablet (300 mg total) by mouth daily.   cyclobenzaprine 10 MG tablet Commonly known as: FLEXERIL Take 1 tablet (10 mg total) by mouth at bedtime. One tablet every night at bedtime as needed for spasm. What changed: Another medication with the same name was removed. Continue taking this medication, and  follow the directions you see here.   Dexcom G7 Sensor Misc Inject 1 Application into the skin as directed. Change sensor every 10 days as directed.   DULoxetine 60 MG capsule Commonly known as: CYMBALTA Take 60 mg by mouth 2 (two) times daily.   LORazepam 1 MG tablet Commonly known as: ATIVAN Take 1 mg by mouth 3 (three) times daily as needed for anxiety.   metoprolol succinate 25 MG 24 hr tablet Commonly known as: TOPROL-XL TAKE 1 TABLET(25 MG) BY MOUTH DAILY   NON FORMULARY Pt uses a cpap nightly   omeprazole 20 MG capsule Commonly known as: PRILOSEC Take 20 mg by mouth in the morning.   predniSONE 5 MG (21) Tbpk tablet Commonly known as: STERAPRED  UNI-PAK 21 TAB Take 6 pills first day; 5 pills second day; 4 pills third day; 3 pills fourth day; 2 pills next day and 1 pill last day.   tamsulosin 0.4 MG Caps capsule Commonly known as: FLOMAX Take 1 capsule (0.4 mg total) by mouth daily after supper.   VITAMIN B12 PO Take 1 tablet by mouth every other day.   Vitamin D (Ergocalciferol) 1.25 MG (50000 UNIT) Caps capsule Commonly known as: DRISDOL Take 1 capsule (50,000 Units total) by mouth every 7 (seven) days.   VITAMIN D3 PO Take 7,500 mcg by mouth daily.   zolpidem 10 MG tablet Commonly known as: AMBIEN Take 10 mg by mouth at bedtime.        Allergies:  Allergies  Allergen Reactions   Codeine Itching, Rash and Other (See Comments)    Family History: Family History  Problem Relation Age of Onset   Heart disease Father 39   Kidney failure Father    Colon cancer Neg Hx     Social History:  reports that he quit smoking about 26 years ago. His smoking use included cigarettes. He started smoking about 36 years ago. He has a 5 pack-year smoking history. He has been exposed to tobacco smoke. He has never used smokeless tobacco. He reports that he does not currently use alcohol. He reports current drug use. Drug: Marijuana.  ROS: All other review of systems were reviewed and are negative except what is noted above in HPI  Physical Exam: BP 107/66   Pulse 67   Constitutional:  Alert and oriented, No acute distress. HEENT: [place] AT, moist mucus membranes.  Trachea midline, no masses. Cardiovascular: No clubbing, cyanosis, or edema. Respiratory: Normal respiratory effort, no increased work of breathing. GI: Abdomen is soft, nontender, nondistended, no abdominal masses GU: No CVA tenderness.  Lymph: No cervical or inguinal lymphadenopathy. Skin: No rashes, bruises or suspicious lesions. Neurologic: Grossly intact, no focal deficits, moving all 4 extremities. Psychiatric: Normal mood and affect.  Laboratory Data: Lab  Results  Component Value Date   WBC 5.3 03/26/2023   HGB 15.1 03/26/2023   HCT 43.9 03/26/2023   MCV 89 03/26/2023   PLT 171 03/26/2023    Lab Results  Component Value Date   CREATININE 0.99 04/09/2023    No results found for: "PSA"  No results found for: "TESTOSTERONE"  Lab Results  Component Value Date   HGBA1C 5.3 03/26/2023    Urinalysis    Component Value Date/Time   APPEARANCEUR Clear 01/26/2023 1153   GLUCOSEU Negative 01/26/2023 1153   BILIRUBINUR Negative 01/26/2023 1153   PROTEINUR Negative 01/26/2023 1153   NITRITE Negative 01/26/2023 1153   LEUKOCYTESUR Negative 01/26/2023 1153    Lab Results  Component Value  Date   LABMICR Comment 01/26/2023   WBCUA None seen 06/26/2020   LABEPIT 0-10 06/26/2020   BACTERIA None seen 06/26/2020    Pertinent Imaging: Renal US 9/24: Images reviewed and discussed with the patient  Results for orders placed in visit on 07/08/22  DG Abd 1 View  Narrative CLINICAL DATA:  Kidney stones, lithotripsy RIGHT 6 months ago, has passed 5 stones since  EXAM: ABDOMEN - 1 VIEW  COMPARISON:  06/10/2021  FINDINGS: Tiny calculus inferior pole LEFT kidney.  Question 7 mm calculus upper pole LEFT kidney.  No additional urinary tract calcifications.  Bowel gas pattern normal.  Osseous structures unremarkable.  IMPRESSION: Potentially 2 LEFT renal calculi.   Electronically Signed By: Ulyses Southward M.D. On: 07/11/2022 11:44  No results found for this or any previous visit.  No results found for this or any previous visit.  No results found for this or any previous visit.  Results for orders placed during the hospital encounter of 12/15/22  Ultrasound renal complete  Narrative CLINICAL DATA:  Nephrolithiasis  EXAM: RENAL / URINARY TRACT ULTRASOUND COMPLETE  COMPARISON:  10/11/2020.  FINDINGS: Right Kidney:  Length: 11.1 cm. Echogenicity within normal limits. No mass or hydronephrosis visualized.  Shadowing lower pole stone identified that measures at least 6 mm.  Left Kidney:  Length: 13.0 cm. Echogenicity within normal limits. No mass or hydronephrosis visualized. No shadowing stones are demonstrated.  Bladder:  Appears normal for degree of bladder distention.  Splenomegaly incidentally noted, 13.9 cm.  IMPRESSION: 1. Right-sided nephrolithiasis. 2. No hydronephrosis. 3. Splenomegaly incidentally noted.   Electronically Signed By: Layla Maw M.D. On: 01/02/2023 17:52  No results found for this or any previous visit.  No results found for this or any previous visit.  Results for orders placed in visit on 11/19/20  CT RENAL STONE STUDY  Narrative CLINICAL DATA:  Two weeks of intermittent sharp severe nonradiating right flank pain.  EXAM: CT ABDOMEN AND PELVIS WITHOUT CONTRAST  TECHNIQUE: Multidetector CT imaging of the abdomen and pelvis was performed following the standard protocol without IV contrast.  COMPARISON:  CT February 29, 2020  FINDINGS: Lower chest: No acute abnormality.  Hepatobiliary: Unremarkable noncontrast appearance of the hepatic parenchyma. Gallbladder surgically absent. No biliary ductal dilation.  Pancreas: Within normal limits.  Spleen: Within normal limits.  Adrenals/Urinary Tract: Bilateral adrenal glands are unremarkable. No hydronephrosis. Left kidney is unremarkable. Punctate nonobstructive right lower pole renal stone. No obstructive ureteral or bladder calculus visualized. Urinary bladder is decompressed limiting evaluation.  Stomach/Bowel: Prior gastric bypass. No pathologic dilation of small or large bowel. Appendix is surgically absent.  Vascular/Lymphatic: Aortic atherosclerosis without abdominal aortic aneurysm. No pathologically enlarged abdominal or pelvic lymph nodes.  Reproductive: Prostate is unremarkable.  Other: Fat in bilateral inguinal canals.  No abdominopelvic ascites.  Musculoskeletal:  Mild thoracolumbar spondylosis. No acute osseous abnormality.  IMPRESSION: 1. No acute findings in the abdomen or pelvis. 2. Punctate nonobstructive right lower pole renal stone. No obstructive ureteral or bladder calculus visualized. 3. Prior gastric bypass and cholecystectomy. 4.  Aortic Atherosclerosis (ICD10-I70.0).   Electronically Signed By: Maudry Mayhew M.D. On: 11/20/2020 11:11   Assessment & Plan:    1. Nephrolithiasis (Primary) Continue allopurinol -followup 6 months with KUB - Urinalysis, Routine w reflex microscopic   No follow-ups on file.  Wilkie Aye, MD  [facility] [place]

## 2023-04-15 ENCOUNTER — Ambulatory Visit: Payer: Federal, State, Local not specified - PPO | Admitting: Orthopaedic Surgery

## 2023-04-16 ENCOUNTER — Ambulatory Visit (HOSPITAL_COMMUNITY)
Admission: RE | Admit: 2023-04-16 | Discharge: 2023-04-16 | Disposition: A | Discharge status: Home / Self Care | Payer: Federal, State, Local not specified - PPO | Source: Ambulatory Visit | Attending: Orthopaedic Surgery | Admitting: Orthopaedic Surgery

## 2023-04-16 DIAGNOSIS — R609 Edema, unspecified: Secondary | ICD-10-CM | POA: Diagnosis not present

## 2023-04-16 DIAGNOSIS — M48061 Spinal stenosis, lumbar region without neurogenic claudication: Secondary | ICD-10-CM | POA: Diagnosis not present

## 2023-04-16 DIAGNOSIS — M545 Low back pain, unspecified: Secondary | ICD-10-CM | POA: Diagnosis not present

## 2023-04-20 ENCOUNTER — Encounter: Payer: Self-pay | Admitting: Urology

## 2023-04-20 NOTE — Patient Instructions (Signed)

## 2023-04-21 ENCOUNTER — Telehealth: Payer: Self-pay

## 2023-04-21 ENCOUNTER — Telehealth: Payer: Self-pay | Admitting: Nurse Practitioner

## 2023-04-21 ENCOUNTER — Encounter: Payer: Self-pay | Admitting: Orthopaedic Surgery

## 2023-04-21 ENCOUNTER — Ambulatory Visit: Payer: Federal, State, Local not specified - PPO | Admitting: Orthopaedic Surgery

## 2023-04-21 ENCOUNTER — Other Ambulatory Visit (HOSPITAL_COMMUNITY): Payer: Self-pay

## 2023-04-21 VITALS — BP 113/69 | HR 61 | Ht 71.0 in | Wt 220.0 lb

## 2023-04-21 DIAGNOSIS — M545 Low back pain, unspecified: Secondary | ICD-10-CM | POA: Diagnosis not present

## 2023-04-21 NOTE — Telephone Encounter (Signed)
Pt states that [facility] is waiting on a PA for the G7 Sensors.

## 2023-04-21 NOTE — Progress Notes (Signed)
My back hurts all the time.  He continues to have lower back pain.  He says nothing helps.  He is to go on cruise to the [place] February 11.  He has no new trauma.  MRI of the back showed: IMPRESSION: 1. L4-5: Moderate circumferential bulging of the disc. Facet and ligamentous hypertrophy with edematous change. Multifactorial spinal stenosis that could cause neural compression on either or both sides. The facet arthropathy could be a cause of back pain or referred facet syndrome pain. Discogenic endplate marrow edema on the right could relate to regional pain. 2. L5-S1: Disc degeneration with broad-based shallow disc protrusion, slightly more prominent towards the left. Mild facet and ligamentous hypertrophy. Narrowing of the subarticular lateral recesses, left more than right. Some potential for neural compression in the lateral recesses, particularly left.  I have explained the findings to him.  I will have neurosurgery see him.  I have independently reviewed the MRI.    His back is diffusely tender, ROM decreased, muscle tone and strength normal, NV intact.  Encounter Diagnosis  Name Primary?   Lumbar pain Yes   To see neurosurgery.  I will see in one month.  On his cruise, he is to swim with "the pigs".  I have never heard of this.  I will get a report when he returns.  He says pain medicine does not help.  I have given none.  Call if any problem.  Precautions discussed.  Electronically Signed Darreld Mclean, MD 1/29/202510:08 AM

## 2023-04-21 NOTE — Telephone Encounter (Signed)
Patient looking for PA for Dexcom on grounds of hypoglycemia.

## 2023-04-21 NOTE — Telephone Encounter (Signed)
Clinical info submitted.

## 2023-04-21 NOTE — Telephone Encounter (Signed)
Pharmacy Patient Advocate Encounter   Received notification from Pt Calls Messages that prior authorization for Dexcom G7 sensor is required/requested.   Insurance verification completed.   The patient is insured through [facility] .   Per test claim: PA required; PA started via CoverMyMeds. KEY [id number] . Waiting for clinical questions to populate.

## 2023-04-21 NOTE — Telephone Encounter (Signed)
Called and let pt know

## 2023-04-26 NOTE — Telephone Encounter (Signed)
PA request has been Submitted. New Encounter created for follow up. For additional info see Pharmacy Prior Auth telephone encounter from 01/29.

## 2023-04-27 DIAGNOSIS — M4726 Other spondylosis with radiculopathy, lumbar region: Secondary | ICD-10-CM | POA: Diagnosis not present

## 2023-04-27 DIAGNOSIS — M4316 Spondylolisthesis, lumbar region: Secondary | ICD-10-CM | POA: Diagnosis not present

## 2023-04-27 DIAGNOSIS — Z6830 Body mass index (BMI) 30.0-30.9, adult: Secondary | ICD-10-CM | POA: Diagnosis not present

## 2023-04-28 ENCOUNTER — Ambulatory Visit (HOSPITAL_COMMUNITY): Payer: Federal, State, Local not specified - PPO

## 2023-04-30 ENCOUNTER — Other Ambulatory Visit: Payer: Self-pay | Admitting: Neurosurgery

## 2023-05-04 ENCOUNTER — Other Ambulatory Visit: Payer: Self-pay | Admitting: Cardiovascular Disease

## 2023-05-04 ENCOUNTER — Other Ambulatory Visit: Payer: Self-pay

## 2023-05-04 ENCOUNTER — Encounter (HOSPITAL_COMMUNITY): Payer: Self-pay

## 2023-05-04 ENCOUNTER — Encounter (HOSPITAL_COMMUNITY)
Admission: RE | Admit: 2023-05-04 | Discharge: 2023-05-04 | Disposition: A | Discharge status: Home / Self Care | Payer: Federal, State, Local not specified - PPO | Source: Ambulatory Visit | Attending: Neurosurgery | Admitting: Neurosurgery

## 2023-05-04 VITALS — BP 118/69 | HR 62 | Temp 97.8°F | Resp 17 | Ht 71.0 in | Wt 220.4 lb

## 2023-05-04 DIAGNOSIS — R7303 Prediabetes: Secondary | ICD-10-CM | POA: Insufficient documentation

## 2023-05-04 DIAGNOSIS — I2510 Atherosclerotic heart disease of native coronary artery without angina pectoris: Secondary | ICD-10-CM | POA: Insufficient documentation

## 2023-05-04 DIAGNOSIS — R001 Bradycardia, unspecified: Secondary | ICD-10-CM | POA: Insufficient documentation

## 2023-05-04 DIAGNOSIS — Z8679 Personal history of other diseases of the circulatory system: Secondary | ICD-10-CM | POA: Diagnosis not present

## 2023-05-04 DIAGNOSIS — Z01818 Encounter for other preprocedural examination: Secondary | ICD-10-CM | POA: Insufficient documentation

## 2023-05-04 DIAGNOSIS — Z8669 Personal history of other diseases of the nervous system and sense organs: Secondary | ICD-10-CM | POA: Insufficient documentation

## 2023-05-04 DIAGNOSIS — F32A Depression, unspecified: Secondary | ICD-10-CM | POA: Diagnosis not present

## 2023-05-04 DIAGNOSIS — K219 Gastro-esophageal reflux disease without esophagitis: Secondary | ICD-10-CM | POA: Diagnosis not present

## 2023-05-04 DIAGNOSIS — G4733 Obstructive sleep apnea (adult) (pediatric): Secondary | ICD-10-CM | POA: Insufficient documentation

## 2023-05-04 DIAGNOSIS — F419 Anxiety disorder, unspecified: Secondary | ICD-10-CM | POA: Insufficient documentation

## 2023-05-04 DIAGNOSIS — M4310 Spondylolisthesis, site unspecified: Secondary | ICD-10-CM | POA: Insufficient documentation

## 2023-05-04 DIAGNOSIS — Z9884 Bariatric surgery status: Secondary | ICD-10-CM | POA: Insufficient documentation

## 2023-05-04 LAB — BASIC METABOLIC PANEL
Anion gap: 4 — ABNORMAL LOW (ref 5–15)
BUN: 9 mg/dL (ref 6–20)
CO2: 27 mmol/L (ref 22–32)
Calcium: 8.4 mg/dL — ABNORMAL LOW (ref 8.9–10.3)
Chloride: 107 mmol/L (ref 98–111)
Creatinine, Ser: 0.86 mg/dL (ref 0.61–1.24)
GFR, Estimated: >60 mL/min (ref >60)
Glucose, Bld: 123 mg/dL — ABNORMAL HIGH (ref 70–99)
Potassium: 4.1 mmol/L (ref 3.5–5.1)
Sodium: 138 mmol/L (ref 135–145)

## 2023-05-04 LAB — TYPE AND SCREEN
ABO/RH(D): O NEG
Antibody Screen: NEGATIVE

## 2023-05-04 LAB — CBC
HCT: 43.9 % (ref 39.0–52.0)
Hemoglobin: 15 g/dL (ref 13.0–17.0)
MCH: 31.1 pg (ref 26.0–34.0)
MCHC: 34.2 g/dL (ref 30.0–36.0)
MCV: 90.9 fL (ref 80.0–100.0)
Platelets: 175 10*3/uL (ref 150–400)
RBC: 4.83 MIL/uL (ref 4.22–5.81)
RDW: 13.6 % (ref 11.5–15.5)
WBC: 6 10*3/uL (ref 4.0–10.5)
nRBC: 0 % (ref 0.0–0.2)

## 2023-05-04 LAB — SURGICAL PCR SCREEN
MRSA, PCR: NEGATIVE
Staphylococcus aureus: NEGATIVE

## 2023-05-04 NOTE — Telephone Encounter (Signed)
Pharmacy Patient Advocate Encounter  Received notification from [facility] that Prior Authorization for Dexcom G7 sensor has been DENIED.  Full denial letter will be uploaded to the media tab. See denial reason below.    PA #/Case ID/Reference #: [id number]

## 2023-05-04 NOTE — Telephone Encounter (Signed)
That's great news, it is likely they have to do the call going over proper placement as a protocol since they are providing them?  Anyways, I am glad he is able to get them.

## 2023-05-04 NOTE — Progress Notes (Addendum)
PCP - Gilmore Laroche, FNP  Cardiologist -  Endocrinology Dani Gobble, NP   PPM/ICD - denies Device Orders - n/a Rep Notified - n/a  Chest x-ray -  EKG - 05-04-23 Stress Test -  ECHO - 03-13-21 Cardiac Cath -    CPAP -  yes but not tolerating recently due to back pain  DM - denies  Blood Thinner Instructions:denies Aspirin Instructions:denies  ERAS Protcol - clear liquids until 12:30 P.M.   COVID TEST- n/a   Anesthesia review: yes review ekg  Patient denies shortness of breath, fever, cough and chest pain at PAT appointment   All instructions explained to the patient, with a verbal understanding of the material. Patient agrees to go over the instructions while at home for a better understanding. Patient also instructed to self quarantine after being tested for COVID-19. The opportunity to ask questions was provided.

## 2023-05-04 NOTE — Telephone Encounter (Signed)
I guess we need to let him know to try the [facility] which I have told him about already since insurance denied his request for the Dexcom.

## 2023-05-04 NOTE — Telephone Encounter (Signed)
Talked to the patient. He shares that yesterday he received a call from the [employer].They told the patient that they were going to supply him with the Dexcom G7. They want him to wear a few weeks, them they are going to do a telephone visit to insure that the patient knows how to use it correctly. He shares that he is confused as he has been wearing the sensors and knows what to do. He plans to keep being followed by Alphonzo Lemmings and he also plans to get his nutrition visits here as  his [employer] will pay for this.

## 2023-05-05 ENCOUNTER — Telehealth: Payer: Self-pay | Admitting: Nurse Practitioner

## 2023-05-05 MED ORDER — METOPROLOL SUCCINATE ER 25 MG PO TB24: 25.0000 mg | ORAL_TABLET | Freq: Every day | ORAL | 1 refills | Status: DC

## 2023-05-05 NOTE — Progress Notes (Signed)
Message left for the pt to arrive tom at 1145.

## 2023-05-05 NOTE — Anesthesia Preprocedure Evaluation (Signed)
Anesthesia Evaluation  Patient identified by MRN, date of birth, ID band Patient awake    Reviewed: Allergy & Precautions, NPO status , Patient's Chart, lab work & pertinent test results, reviewed documented beta blocker date and time   History of Anesthesia Complications Negative for: history of anesthetic complications  Airway Mallampati: III  TM Distance: >3 FB     Dental no notable dental hx.    Pulmonary sleep apnea , neg COPD, Patient abstained from smoking., former smoker   breath sounds clear to auscultation       Cardiovascular hypertension, Pt. on medications (-) angina + CAD  (-) Past MI and (-) Cardiac Stents  Rhythm:Regular Rate:Normal     Neuro/Psych Seizures -, Well Controlled,  PSYCHIATRIC DISORDERS Anxiety Depression       GI/Hepatic ,GERD  Medicated and Controlled,,(+) neg Cirrhosis        Endo/Other    Renal/GU Renal disease     Musculoskeletal  (+) Arthritis ,    Abdominal   Peds  Hematology   Anesthesia Other Findings   Reproductive/Obstetrics                              Anesthesia Physical Anesthesia Plan  ASA: 2  Anesthesia Plan: General   Post-op Pain Management:    Induction: Intravenous  PONV Risk Score and Plan: 2 and Ondansetron and Dexamethasone  Airway Management Planned: Oral ETT  Additional Equipment:   Intra-op Plan:   Post-operative Plan: Extubation in OR  Informed Consent: I have reviewed the patients History and Physical, chart, labs and discussed the procedure including the risks, benefits and alternatives for the proposed anesthesia with the patient or authorized representative who has indicated his/her understanding and acceptance.     Dental advisory given  Plan Discussed with: CRNA  Anesthesia Plan Comments: (See PAT note from 2/11 by Sherlie Ban PA-C )         Anesthesia Quick Evaluation

## 2023-05-05 NOTE — Telephone Encounter (Signed)
Medication refill completed

## 2023-05-05 NOTE — Progress Notes (Addendum)
Case: [id number] Date/Time: 05/06/23 1515   Procedure: PLIF - L4-L5 - Posterior Lateral and Interbody fusion   Anesthesia type: General   Pre-op diagnosis: Spondylolisthesis   Location: [facility] SCHEDULED ROOM 01 - ADD ON / [facility]   Surgeons: Coletta Memos, MD       DISCUSSION: William Farley is a 63 year old male who presents to PAT prior to surgery above.  Past medical history significant for CAD, syncope, PAF, OSA (uses CPAP), GERD, s/p gastric bypass, prediabetes, seizure (not on AED), anxiety, depression  Patient had a cardiac evaluation for syncope in 2022.  He underwent echo and cardiac monitoring.  Echo showed normal EF with no significant valvular abnormalities.  Holter monitoring showed rare PACs, PVCs, SVT and brief A-fib.  He is not anticoagulated.  CTA of the coronaries in 2023 showed mild nonobstructive CAD.  It was thought that his symptoms were actually a seizure rather than syncope.   Patient followed up with neurology for seizure-like activity in 2023. Brain MRI with and without contrast and 24-hour EEG normal.  Was started on Depakote but developed thrombocytopenia and was weaned off.  Has not had any seizure-like activity for several years.  VS: BP 118/69   Pulse 62   Temp 36.6 C   Resp 17   Ht 5\' 11"  (1.803 m)   Wt 100 kg   SpO2 99%   BMI 30.74 kg/m   PROVIDERS: Gilmore Laroche, FNP   LABS: Labs reviewed: Acceptable for surgery. (all labs ordered are listed, but only abnormal results are displayed)  Labs Reviewed  BASIC METABOLIC PANEL - Abnormal; Notable for the following components:      Result Value   Glucose, Bld 123 (*)    Calcium 8.4 (*)    Anion gap 4 (*)    All other components within normal limits  SURGICAL PCR SCREEN  CBC  TYPE AND SCREEN     IMAGES:   EKG 05/04/23  Sinus bradycardia, rate 55 Possible Left atrial enlargement  CV:  CTA coronaries 04/30/2021:  IMPRESSION: 1. Coronary calcium score of 19. This was 50th percentile  for age and sex matched control.   2.  Normal coronary origin with right dominance.   3. Nonobstructive CAD, with calcified plaque in proximal LAD causing minimal (0-24%) stenosis.   CAD-RADS 1. Minimal non-obstructive CAD (0-24%). Consider non-atherosclerotic causes of chest pain. Consider preventive therapy and risk factor modification.  Zio patch 04/03/2021:  ZIO AT reviewed.  13 days, 16 hours analyzed.  Predominant rhythm is sinus with heart rate ranging from 47 bpm up to 140 bpm and average heart rate 77 bpm.   There were rare PACs including couplets and triplets representing less than 1% total beats.   There were rare PVCs including couplets representing less than 1% total beats.   Multiple, brief episodes of SVT were noted.  An episode of sustained atrial fibrillation with RVR was also noted on December 24 with heart rate in the 150s on average.  Atrial fibrillation represented less than 1% total rhythm burden however.  Echo 03/13/2021:  IMPRESSIONS    1. Left ventricular ejection fraction, by estimation, is 65 to 70%. The left ventricle has normal function. The left ventricle has no regional wall motion abnormalities. The left ventricular internal cavity size was mildly dilated. Left ventricular diastolic parameters were normal.  2. Right ventricular systolic function is normal. The right ventricular size is normal. There is normal pulmonary artery systolic pressure.  3. The mitral valve  is normal in structure. Trivial mitral valve regurgitation.  4. The aortic valve is tricuspid. Aortic valve regurgitation is not visualized.  Past Medical History:  Diagnosis Date   Anxiety    Arthritis    CAD (coronary artery disease)    Minimal Non-obstructive CAD seen on coronary CTA 04/2021   Depression    GERD (gastroesophageal reflux disease)    History of kidney stones    History of thrombocytopenia    Hx of syncope    PAF (paroxysmal atrial fibrillation) (HCC)     ChadsVasc Score is 0.  Monitor revealed less than 1% PAF burden.   Seizure (HCC) 02/2021   Sleep apnea    wears CPAP     Past Surgical History:  Procedure Laterality Date   APPENDECTOMY     BIOPSY  05/01/2021   Procedure: BIOPSY;  Surgeon: Malissa Hippo, MD;  [facility];  Service: Endoscopy;;   CERVICAL SPINE SURGERY  2018   CHOLECYSTECTOMY     COLONOSCOPY WITH PROPOFOL N/A 05/01/2021   Procedure: COLONOSCOPY WITH PROPOFOL;  Surgeon: Malissa Hippo, MD;  [facility];  Service: Endoscopy;  Laterality: N/A;  1050   CYST EXCISION     CYSTOSCOPY     CYSTOSCOPY WITH RETROGRADE PYELOGRAM, URETEROSCOPY AND STENT PLACEMENT Right 08/08/2020   Procedure: CYSTOSCOPY WITH RIGHT RETROGRADE PYELOGRAM AND RIGHT URETEROSCOPY;  Surgeon: Malen Gauze, MD;  Location: [facility];  Service: Urology;  Laterality: Right;   EXTRACORPOREAL SHOCK WAVE LITHOTRIPSY Right 05/21/2020   Procedure: EXTRACORPOREAL SHOCK WAVE LITHOTRIPSY (ESWL);  Surgeon: Malen Gauze, MD;  Location: [facility];  Service: Urology;  Laterality: Right;   GASTRIC BYPASS OPEN  2003   INCISIONAL HERNIA REPAIR     kidney stone removal     KNEE ARTHROSCOPY Left    multiple   NECK SURGERY     POLYPECTOMY  05/01/2021   Procedure: POLYPECTOMY;  Surgeon: Malissa Hippo, MD;  [facility];  Service: Endoscopy;;   REPLACEMENT TOTAL KNEE Right    RHINOPLASTY     STONE EXTRACTION WITH BASKET Right 08/08/2020   Procedure: STONE EXTRACTION WITH BASKET;  Surgeon: Malen Gauze, MD;  Location: [facility];  Service: Urology;  Laterality: Right;   TOTAL KNEE ARTHROPLASTY Left 03/10/2022   Procedure: TOTAL KNEE ARTHROPLASTY;  Surgeon: Vickki Hearing, MD;  Location: [facility];  Service: Orthopedics;  Laterality: Left;    MEDICATIONS:  allopurinol (ZYLOPRIM) 300 MG tablet   Continuous Glucose Sensor (DEXCOM G7 SENSOR) MISC   Cyanocobalamin (VITAMIN B12 PO)   cyclobenzaprine (FLEXERIL) 10 MG tablet    DULoxetine (CYMBALTA) 60 MG capsule   LORazepam (ATIVAN) 1 MG tablet   metoprolol succinate (TOPROL-XL) 25 MG 24 hr tablet   NON FORMULARY   omeprazole (PRILOSEC) 20 MG capsule   tamsulosin (FLOMAX) 0.4 MG CAPS capsule   Vitamin D, Ergocalciferol, (DRISDOL) 1.25 MG (50000 UNIT) CAPS capsule   zolpidem (AMBIEN) 10 MG tablet   No current facility-administered medications for this encounter.   Marcille Blanco [facility] Surgical Short Stay/Anesthesiology [facility] Phone [phone] 05/05/2023 9:35 AM

## 2023-05-05 NOTE — Telephone Encounter (Signed)
*  STAT* If patient is at the pharmacy, call can be transferred to refill team.   1. Which medications need to be refilled? (please list name of each medication and dose if known) metoprolol succinate (TOPROL-XL) 25 MG 24 hr tablet     4. Which pharmacy/location (including street and city if local pharmacy) is medication to be sent to?[id number] - [place], [place] - [address]    5. Do they need a 30 day or 90 day supply? 90  Pt scheduled with Philis Nettle, NP for 06/10/23 next available

## 2023-05-06 ENCOUNTER — Other Ambulatory Visit: Payer: Self-pay

## 2023-05-06 ENCOUNTER — Encounter (HOSPITAL_COMMUNITY): Payer: Self-pay | Admitting: Neurosurgery

## 2023-05-06 ENCOUNTER — Encounter (HOSPITAL_COMMUNITY)
Admission: RE | Disposition: A | Discharge status: Home / Self Care | Payer: Self-pay | Source: Home / Self Care | Attending: Neurosurgery

## 2023-05-06 ENCOUNTER — Ambulatory Visit (HOSPITAL_COMMUNITY): Payer: Federal, State, Local not specified - PPO | Admitting: Anesthesiology

## 2023-05-06 ENCOUNTER — Ambulatory Visit (HOSPITAL_COMMUNITY): Payer: Self-pay | Admitting: Medical

## 2023-05-06 ENCOUNTER — Ambulatory Visit (HOSPITAL_COMMUNITY)
Admission: RE | Admit: 2023-05-06 | Discharge: 2023-05-07 | Disposition: A | Discharge status: Home / Self Care | Payer: Federal, State, Local not specified - PPO | Attending: Neurosurgery | Admitting: Neurosurgery

## 2023-05-06 ENCOUNTER — Ambulatory Visit (HOSPITAL_COMMUNITY): Payer: Federal, State, Local not specified - PPO

## 2023-05-06 DIAGNOSIS — Z79899 Other long term (current) drug therapy: Secondary | ICD-10-CM | POA: Insufficient documentation

## 2023-05-06 DIAGNOSIS — F419 Anxiety disorder, unspecified: Secondary | ICD-10-CM | POA: Insufficient documentation

## 2023-05-06 DIAGNOSIS — F32A Depression, unspecified: Secondary | ICD-10-CM | POA: Diagnosis not present

## 2023-05-06 DIAGNOSIS — Z981 Arthrodesis status: Secondary | ICD-10-CM | POA: Insufficient documentation

## 2023-05-06 DIAGNOSIS — G4730 Sleep apnea, unspecified: Secondary | ICD-10-CM | POA: Diagnosis not present

## 2023-05-06 DIAGNOSIS — M4316 Spondylolisthesis, lumbar region: Secondary | ICD-10-CM | POA: Diagnosis not present

## 2023-05-06 DIAGNOSIS — F17220 Nicotine dependence, chewing tobacco, uncomplicated: Secondary | ICD-10-CM | POA: Diagnosis not present

## 2023-05-06 DIAGNOSIS — M48062 Spinal stenosis, lumbar region with neurogenic claudication: Secondary | ICD-10-CM | POA: Diagnosis not present

## 2023-05-06 DIAGNOSIS — Z0189 Encounter for other specified special examinations: Secondary | ICD-10-CM | POA: Diagnosis not present

## 2023-05-06 DIAGNOSIS — K219 Gastro-esophageal reflux disease without esophagitis: Secondary | ICD-10-CM | POA: Diagnosis not present

## 2023-05-06 DIAGNOSIS — I10 Essential (primary) hypertension: Secondary | ICD-10-CM | POA: Insufficient documentation

## 2023-05-06 DIAGNOSIS — Z96653 Presence of artificial knee joint, bilateral: Secondary | ICD-10-CM | POA: Diagnosis not present

## 2023-05-06 DIAGNOSIS — I2510 Atherosclerotic heart disease of native coronary artery without angina pectoris: Secondary | ICD-10-CM | POA: Diagnosis not present

## 2023-05-06 DIAGNOSIS — Z9049 Acquired absence of other specified parts of digestive tract: Secondary | ICD-10-CM | POA: Insufficient documentation

## 2023-05-06 DIAGNOSIS — E119 Type 2 diabetes mellitus without complications: Secondary | ICD-10-CM | POA: Insufficient documentation

## 2023-05-06 HISTORY — PX: POSTERIOR FUSION PEDICLE SCREW PLACEMENT: SHX2186

## 2023-05-06 LAB — GLUCOSE, CAPILLARY
Glucose-Capillary: 98 mg/dL (ref 70–99)
Glucose-Capillary: 88 mg/dL (ref 70–99)

## 2023-05-06 SURGERY — POSTERIOR LUMBAR FUSION 1 LEVEL
Anesthesia: General

## 2023-05-06 MED ORDER — METOPROLOL SUCCINATE ER 25 MG PO TB24
25.0000 mg | ORAL_TABLET | Freq: Every day | ORAL | Status: DC
  Administered 2023-05-07: 25 mg via ORAL
  Filled 2023-05-06: qty 1

## 2023-05-06 MED ORDER — PANTOPRAZOLE SODIUM 40 MG PO TBEC
40.0000 mg | DELAYED_RELEASE_TABLET | Freq: Every day | ORAL | Status: DC
  Administered 2023-05-07: 40 mg via ORAL
  Filled 2023-05-06: qty 1

## 2023-05-06 MED ORDER — POTASSIUM CHLORIDE IN NACL 20-0.9 MEQ/L-% IV SOLN
INTRAVENOUS | Status: DC
Start: 2023-05-06 — End: 2023-05-07

## 2023-05-06 MED ORDER — ZOLPIDEM TARTRATE 5 MG PO TABS
10.0000 mg | ORAL_TABLET | Freq: Every day | ORAL | Status: DC
  Administered 2023-05-07: 10 mg via ORAL
  Filled 2023-05-06: qty 2

## 2023-05-06 MED ORDER — ALLOPURINOL 300 MG PO TABS
300.0000 mg | ORAL_TABLET | Freq: Every day | ORAL | Status: DC
  Administered 2023-05-07: 300 mg via ORAL
  Filled 2023-05-06: qty 1

## 2023-05-06 MED ORDER — OXYCODONE HCL 5 MG PO TABS: 5.0000 mg | ORAL_TABLET | ORAL | Status: DC | PRN

## 2023-05-06 MED ORDER — LIDOCAINE-EPINEPHRINE 0.5 %-1:200000 IJ SOLN
INTRAMUSCULAR | Status: DC | PRN
  Administered 2023-05-06: 10 mL

## 2023-05-06 MED ORDER — ACETAMINOPHEN 10 MG/ML IV SOLN
INTRAVENOUS | Status: AC
  Filled 2023-05-06: qty 100

## 2023-05-06 MED ORDER — THROMBIN 20000 UNITS EX SOLR
CUTANEOUS | Status: AC
  Filled 2023-05-06: qty 20000

## 2023-05-06 MED ORDER — HYDROMORPHONE HCL 1 MG/ML IJ SOLN
INTRAMUSCULAR | Status: AC
  Filled 2023-05-06: qty 0.5

## 2023-05-06 MED ORDER — ONDANSETRON HCL 4 MG/2ML IJ SOLN
INTRAMUSCULAR | Status: DC | PRN
  Administered 2023-05-06: 4 mg via INTRAVENOUS

## 2023-05-06 MED ORDER — DIAZEPAM 5 MG PO TABS
5.0000 mg | ORAL_TABLET | Freq: Four times a day (QID) | ORAL | Status: DC | PRN
  Administered 2023-05-06 – 2023-05-07 (×2): 5 mg via ORAL
  Filled 2023-05-06 (×2): qty 1

## 2023-05-06 MED ORDER — ACETAMINOPHEN 325 MG PO TABS: 650.0000 mg | ORAL_TABLET | ORAL | Status: DC | PRN

## 2023-05-06 MED ORDER — CYANOCOBALAMIN 500 MCG PO TABS
250.0000 ug | ORAL_TABLET | ORAL | Status: DC
  Administered 2023-05-07: 250 ug via ORAL
  Filled 2023-05-06: qty 1

## 2023-05-06 MED ORDER — SODIUM CHLORIDE 0.9% FLUSH: 3.0000 mL | INTRAVENOUS | Status: DC | PRN

## 2023-05-06 MED ORDER — PROPOFOL 10 MG/ML IV BOLUS
INTRAVENOUS | Status: AC
  Filled 2023-05-06: qty 20

## 2023-05-06 MED ORDER — CEFAZOLIN SODIUM 1 G IJ SOLR
INTRAMUSCULAR | Status: AC
  Filled 2023-05-06: qty 20

## 2023-05-06 MED ORDER — SODIUM CHLORIDE 0.9% FLUSH
3.0000 mL | Freq: Two times a day (BID) | INTRAVENOUS | Status: DC
  Administered 2023-05-07: 3 mL via INTRAVENOUS

## 2023-05-06 MED ORDER — KETOROLAC TROMETHAMINE 15 MG/ML IJ SOLN
15.0000 mg | Freq: Four times a day (QID) | INTRAMUSCULAR | Status: DC
  Administered 2023-05-06 – 2023-05-07 (×3): 15 mg via INTRAVENOUS
  Filled 2023-05-06 (×3): qty 1

## 2023-05-06 MED ORDER — LIDOCAINE-EPINEPHRINE 0.5 %-1:200000 IJ SOLN
INTRAMUSCULAR | Status: AC
  Filled 2023-05-06: qty 50

## 2023-05-06 MED ORDER — 0.9 % SODIUM CHLORIDE (POUR BTL) OPTIME
TOPICAL | Status: DC | PRN
  Administered 2023-05-06 (×2): 1000 mL

## 2023-05-06 MED ORDER — ROCURONIUM BROMIDE 10 MG/ML (PF) SYRINGE
PREFILLED_SYRINGE | INTRAVENOUS | Status: DC | PRN
  Administered 2023-05-06: 20 mg via INTRAVENOUS
  Administered 2023-05-06: 80 mg via INTRAVENOUS
  Administered 2023-05-06: 20 mg via INTRAVENOUS
  Administered 2023-05-06: 40 mg via INTRAVENOUS
  Administered 2023-05-06 (×2): 20 mg via INTRAVENOUS

## 2023-05-06 MED ORDER — VITAMIN D (ERGOCALCIFEROL) 1.25 MG (50000 UNIT) PO CAPS: 50000.0000 [IU] | ORAL_CAPSULE | ORAL | Status: DC

## 2023-05-06 MED ORDER — CHLORHEXIDINE GLUCONATE 0.12 % MT SOLN: 15.0000 mL | Freq: Once | OROMUCOSAL | Status: AC

## 2023-05-06 MED ORDER — ORAL CARE MOUTH RINSE: 15.0000 mL | Freq: Once | OROMUCOSAL | Status: AC

## 2023-05-06 MED ORDER — PROPOFOL 10 MG/ML IV BOLUS
INTRAVENOUS | Status: DC | PRN
  Administered 2023-05-06: 150 mg via INTRAVENOUS
  Administered 2023-05-06: 50 mg via INTRAVENOUS

## 2023-05-06 MED ORDER — FENTANYL CITRATE (PF) 250 MCG/5ML IJ SOLN
INTRAMUSCULAR | Status: DC | PRN
  Administered 2023-05-06: 100 ug via INTRAVENOUS
  Administered 2023-05-06 (×3): 50 ug via INTRAVENOUS

## 2023-05-06 MED ORDER — HEPARIN SODIUM (PORCINE) 5000 UNIT/ML IJ SOLN
5000.0000 [IU] | Freq: Three times a day (TID) | INTRAMUSCULAR | Status: DC
  Administered 2023-05-06 – 2023-05-07 (×2): 5000 [IU] via SUBCUTANEOUS
  Filled 2023-05-06 (×2): qty 1

## 2023-05-06 MED ORDER — ONDANSETRON HCL 4 MG/2ML IJ SOLN: 4.0000 mg | Freq: Four times a day (QID) | INTRAMUSCULAR | Status: DC | PRN

## 2023-05-06 MED ORDER — DEXAMETHASONE SODIUM PHOSPHATE 10 MG/ML IJ SOLN
INTRAMUSCULAR | Status: DC | PRN
Start: 2023-05-06 — End: 2023-05-06
  Administered 2023-05-06: 10 mg via INTRAVENOUS

## 2023-05-06 MED ORDER — OXYCODONE HCL ER 10 MG PO T12A
10.0000 mg | EXTENDED_RELEASE_TABLET | Freq: Two times a day (BID) | ORAL | Status: DC
  Administered 2023-05-06 – 2023-05-07 (×2): 10 mg via ORAL
  Filled 2023-05-06 (×2): qty 1

## 2023-05-06 MED ORDER — HYDROMORPHONE HCL 1 MG/ML IJ SOLN
0.2500 mg | INTRAMUSCULAR | Status: DC | PRN
  Administered 2023-05-06 (×3): 0.5 mg via INTRAVENOUS

## 2023-05-06 MED ORDER — MIDAZOLAM HCL 2 MG/2ML IJ SOLN
INTRAMUSCULAR | Status: AC
  Filled 2023-05-06: qty 2

## 2023-05-06 MED ORDER — CHLORHEXIDINE GLUCONATE 0.12 % MT SOLN
OROMUCOSAL | Status: AC
  Administered 2023-05-06: 15 mL via OROMUCOSAL
  Filled 2023-05-06: qty 15

## 2023-05-06 MED ORDER — MENTHOL 3 MG MT LOZG: 1.0000 | LOZENGE | OROMUCOSAL | Status: DC | PRN

## 2023-05-06 MED ORDER — SODIUM CHLORIDE 0.9 % IV SOLN: 250.0000 mL | INTRAVENOUS | Status: DC

## 2023-05-06 MED ORDER — CEFAZOLIN SODIUM-DEXTROSE 1-4 GM/50ML-% IV SOLN
INTRAVENOUS | Status: DC | PRN
Start: 2023-05-06 — End: 2023-05-06
  Administered 2023-05-06 (×2): 2 g via INTRAVENOUS

## 2023-05-06 MED ORDER — TAMSULOSIN HCL 0.4 MG PO CAPS: 0.4000 mg | ORAL_CAPSULE | Freq: Every day | ORAL | Status: DC

## 2023-05-06 MED ORDER — HYDROMORPHONE HCL 1 MG/ML IJ SOLN
INTRAMUSCULAR | Status: AC
  Filled 2023-05-06: qty 1

## 2023-05-06 MED ORDER — LIDOCAINE 2% (20 MG/ML) 5 ML SYRINGE
INTRAMUSCULAR | Status: DC | PRN
  Administered 2023-05-06: 100 mg via INTRAVENOUS

## 2023-05-06 MED ORDER — DULOXETINE HCL 60 MG PO CPEP
60.0000 mg | ORAL_CAPSULE | Freq: Two times a day (BID) | ORAL | Status: DC
  Administered 2023-05-06 – 2023-05-07 (×2): 60 mg via ORAL
  Filled 2023-05-06 (×2): qty 1

## 2023-05-06 MED ORDER — AMISULPRIDE (ANTIEMETIC) 5 MG/2ML IV SOLN
10.0000 mg | Freq: Once | INTRAVENOUS | Status: AC
Start: 2023-05-06 — End: 2023-05-06
  Administered 2023-05-06: 10 mg via INTRAVENOUS

## 2023-05-06 MED ORDER — HYDROMORPHONE HCL 1 MG/ML IJ SOLN
INTRAMUSCULAR | Status: DC | PRN
  Administered 2023-05-06 (×2): .5 mg via INTRAVENOUS

## 2023-05-06 MED ORDER — ONDANSETRON HCL 4 MG PO TABS: 4.0000 mg | ORAL_TABLET | Freq: Four times a day (QID) | ORAL | Status: DC | PRN

## 2023-05-06 MED ORDER — AMISULPRIDE (ANTIEMETIC) 5 MG/2ML IV SOLN
INTRAVENOUS | Status: AC
  Filled 2023-05-06: qty 4

## 2023-05-06 MED ORDER — LACTATED RINGERS IV SOLN: INTRAVENOUS | Status: DC

## 2023-05-06 MED ORDER — PHENOL 1.4 % MT LIQD: 1.0000 | OROMUCOSAL | Status: DC | PRN

## 2023-05-06 MED ORDER — ACETAMINOPHEN 650 MG RE SUPP: 650.0000 mg | RECTAL | Status: DC | PRN

## 2023-05-06 MED ORDER — THROMBIN 20000 UNITS EX SOLR
CUTANEOUS | Status: DC | PRN
  Administered 2023-05-06: 20 mL via TOPICAL

## 2023-05-06 MED ORDER — BUPIVACAINE HCL (PF) 0.5 % IJ SOLN
INTRAMUSCULAR | Status: AC
  Filled 2023-05-06: qty 30

## 2023-05-06 MED ORDER — ACETAMINOPHEN 10 MG/ML IV SOLN
INTRAVENOUS | Status: DC | PRN
  Administered 2023-05-06: 1000 mg via INTRAVENOUS

## 2023-05-06 MED ORDER — SUGAMMADEX SODIUM 200 MG/2ML IV SOLN
INTRAVENOUS | Status: DC | PRN
  Administered 2023-05-06: 200 mg via INTRAVENOUS

## 2023-05-06 MED ORDER — FENTANYL CITRATE (PF) 250 MCG/5ML IJ SOLN
INTRAMUSCULAR | Status: AC
  Filled 2023-05-06: qty 5

## 2023-05-06 MED ORDER — ALBUMIN HUMAN 5 % IV SOLN: INTRAVENOUS | Status: DC | PRN

## 2023-05-06 MED ORDER — BUPIVACAINE HCL (PF) 0.5 % IJ SOLN
INTRAMUSCULAR | Status: DC | PRN
  Administered 2023-05-06: 30 mL

## 2023-05-06 MED ORDER — ONDANSETRON HCL 4 MG/2ML IJ SOLN
INTRAMUSCULAR | Status: AC
  Filled 2023-05-06: qty 2

## 2023-05-06 MED ORDER — MIDAZOLAM HCL 2 MG/2ML IJ SOLN
INTRAMUSCULAR | Status: DC | PRN
  Administered 2023-05-06: 2 mg via INTRAVENOUS

## 2023-05-06 MED ORDER — DEXMEDETOMIDINE HCL IN NACL 80 MCG/20ML IV SOLN
INTRAVENOUS | Status: DC | PRN
Start: 2023-05-06 — End: 2023-05-06
  Administered 2023-05-06: 8 ug via INTRAVENOUS
  Administered 2023-05-06: 4 ug via INTRAVENOUS

## 2023-05-06 MED ORDER — ROCURONIUM BROMIDE 10 MG/ML (PF) SYRINGE
PREFILLED_SYRINGE | INTRAVENOUS | Status: AC
  Filled 2023-05-06: qty 20

## 2023-05-06 MED ORDER — OXYCODONE HCL 5 MG PO TABS
10.0000 mg | ORAL_TABLET | ORAL | Status: DC | PRN
  Administered 2023-05-06 – 2023-05-07 (×3): 10 mg via ORAL
  Filled 2023-05-06 (×3): qty 2

## 2023-05-06 MED ORDER — DEXAMETHASONE SODIUM PHOSPHATE 10 MG/ML IJ SOLN
INTRAMUSCULAR | Status: AC
  Filled 2023-05-06: qty 1

## 2023-05-06 SURGICAL SUPPLY — 60 items
BAG COUNTER SPONGE SURGICOUNT (BAG) ×1 IMPLANT
BASKET BONE COLLECTION (BASKET) ×1 IMPLANT
BENZOIN TINCTURE PRP APPL 2/3 (GAUZE/BANDAGES/DRESSINGS) IMPLANT
BIT DRILL PLIF MAS DISP 5.5MM (DRILL) IMPLANT
BLADE BONE MILL MEDIUM (MISCELLANEOUS) ×1 IMPLANT
BLADE CLIPPER SURG (BLADE) IMPLANT
BUR MATCHSTICK NEURO 3.0 LAGG (BURR) ×1 IMPLANT
BUR PRECISION FLUTE 5.0 (BURR) ×1 IMPLANT
CANISTER SUCT 3000ML PPV (MISCELLANEOUS) ×1 IMPLANT
CAP RELINE MOD TULIP RMM (Cap) IMPLANT
CNTNR URN SCR LID CUP LEK RST (MISCELLANEOUS) ×1 IMPLANT
COVER BACK TABLE 60X90IN (DRAPES) ×1 IMPLANT
DERMABOND ADVANCED .7 DNX12 (GAUZE/BANDAGES/DRESSINGS) ×1 IMPLANT
DERMABOND ADVANCED .7 DNX6 (GAUZE/BANDAGES/DRESSINGS) IMPLANT
DRAPE C-ARM 42X72 X-RAY (DRAPES) ×2 IMPLANT
DRAPE C-ARMOR (DRAPES) IMPLANT
DRAPE LAPAROTOMY 100X72X124 (DRAPES) ×1 IMPLANT
DRAPE SURG 17X23 STRL (DRAPES) ×1 IMPLANT
DRILL PLIF MAS DISP 5.5MM (DRILL) ×1
DRSG OPSITE POSTOP 4X6 (GAUZE/BANDAGES/DRESSINGS) IMPLANT
DURAPREP 26ML APPLICATOR (WOUND CARE) ×1 IMPLANT
ELECT REM PT RETURN 9FT ADLT (ELECTROSURGICAL) ×1
ELECTRODE REM PT RTRN 9FT ADLT (ELECTROSURGICAL) ×1 IMPLANT
GAUZE 4X4 16PLY ~~LOC~~+RFID DBL (SPONGE) IMPLANT
GAUZE SPONGE 4X4 12PLY STRL (GAUZE/BANDAGES/DRESSINGS) IMPLANT
GLOVE BIO SURGEON STRL SZ 6.5 (GLOVE) IMPLANT
GLOVE BIOGEL PI MICRO STRL 7 (GLOVE) IMPLANT
GLOVE EXAM NITRILE XL STR (GLOVE) IMPLANT
GLOVE SURG LTX SZ6.5 (GLOVE) ×2 IMPLANT
GOWN STRL REUS W/ TWL LRG LVL3 (GOWN DISPOSABLE) ×2 IMPLANT
GOWN STRL REUS W/ TWL XL LVL3 (GOWN DISPOSABLE) IMPLANT
GOWN STRL REUS W/TWL 2XL LVL3 (GOWN DISPOSABLE) IMPLANT
KIT BASIN OR (CUSTOM PROCEDURE TRAY) ×1 IMPLANT
KIT POSITION SURG JACKSON T1 (MISCELLANEOUS) ×1 IMPLANT
KIT TURNOVER KIT B (KITS) ×1 IMPLANT
MILL BONE PREP (MISCELLANEOUS) IMPLANT
NDL HYPO 25X1 1.5 SAFETY (NEEDLE) ×1 IMPLANT
NDL SPNL 18GX3.5 QUINCKE PK (NEEDLE) IMPLANT
NEEDLE HYPO 25X1 1.5 SAFETY (NEEDLE) ×1
NEEDLE SPNL 18GX3.5 QUINCKE PK (NEEDLE) ×1
NS IRRIG 1000ML POUR BTL (IV SOLUTION) ×1 IMPLANT
PACK LAMINECTOMY NEURO (CUSTOM PROCEDURE TRAY) ×1 IMPLANT
PAD ARMBOARD 7.5X6 YLW CONV (MISCELLANEOUS) ×2 IMPLANT
ROD RELINE COCR LORD 5.0X35 (Rod) IMPLANT
SCREW LOCK RSS 4.5/5.0MM (Screw) IMPLANT
SCREW SHANK RELINE MOD 7.5X40 (Screw) IMPLANT
SPACER OLIF DOME 9X26X9.5 10D (Spacer) IMPLANT
SPIKE FLUID TRANSFER (MISCELLANEOUS) ×1 IMPLANT
SPONGE SURGIFOAM ABS GEL 100 (HEMOSTASIS) ×1 IMPLANT
SPONGE T-LAP 4X18 ~~LOC~~+RFID (SPONGE) IMPLANT
STRIP CLOSURE SKIN 1/2X4 (GAUZE/BANDAGES/DRESSINGS) IMPLANT
SUT PROLENE 5 0 RB 1 DA (SUTURE) IMPLANT
SUT PROLENE 6 0 BV (SUTURE) IMPLANT
SUT VIC AB 0 CT1 18XCR BRD8 (SUTURE) ×1 IMPLANT
SUT VIC AB 2-0 CT1 18 (SUTURE) ×1 IMPLANT
SUT VIC AB 3-0 SH 8-18 (SUTURE) ×1 IMPLANT
TOWEL GREEN STERILE (TOWEL DISPOSABLE) ×1 IMPLANT
TOWEL GREEN STERILE FF (TOWEL DISPOSABLE) ×1 IMPLANT
TRAY FOLEY MTR SLVR 16FR STAT (SET/KITS/TRAYS/PACK) ×1 IMPLANT
WATER STERILE IRR 1000ML POUR (IV SOLUTION) ×1 IMPLANT

## 2023-05-06 NOTE — Telephone Encounter (Signed)
Noted

## 2023-05-06 NOTE — H&P (Signed)
BP 125/82   Pulse 63   Temp 98.1 F (36.7 C) (Oral)   Resp 17   Ht 5\' 11"  (1.803 m)   Wt 98.6 kg   SpO2 96%   BMI 30.32 kg/m   William Farley comes in today for evaluation of pain he has in his back.  He rates the pain is 10/10.  Says that the back pain is worse than any of the extremity pain.  He can do very little because of the pain.  He cannot sit, he cannot stand, and he cannot walk.  Says the pain in his back is aching, deep, electrical, gnawing, lancinating, sharp, and shooting.  He said this started on its own.  He does have some extremity weakness, headaches, localized weakness, numbness and he has difficulty walking due to paresthesias.  His bowel and bladder functions are normal.  He does smoke and has smoked for many years.  He uses snuff also.  He has undergone an appendectomy, cholecystectomy, herniorrhaphy, cervical spinal fusion, knee replacement in both knees.  Lives with his spouse.  He does not have children.  He has an allergic reaction to Codeine.  He does have a history on review of systems of abdominal pain, nausea, vomiting, diarrhea, hematuria, urinary frequency, pelvic pain, flank pain, diaphoresis, back pain, neck pain, myalgias, joint pain, joint stiffness, anxiety, depression, suicidal ideation, homicidal thoughts.   Vitals were as follows:  Height 71 inches, weight 218 pounds, BMI is 30.4. Blood pressure 113/72, pulse 73, temperature was 97.8.  Pain 10/10.     He takes Allopurinol, Cyanocobalamin, Cyclobenzaprine.  He has diabetes.  He uses the Dexcom, Duloxetine, Lorazepam, Metoprolol, Omeprazole, Tamsulosin, Vitamin D3, Zolpidem.   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  It is most prominent at L4-5 where he has facet arthropathy.  At that level, he has a tad bit of retrolisthesis, but he certainly is narrow in the neural foramina and lateral recesses.  I do believe this is the most likely reason for his discomfort and pain.  The  conus is normal.  The cauda equina is normal.  No abnormalities in the paraspinous soft tissue.  No infections.  No tumors.     I proposed to William Farley that he undergo a lumbar arthrodesis, posterior screw fixation, and interbody cage placement.  He has decided for this.  Will try to get this done at the surgical center.  I gave him a detailed instruction sheet with regard to the operation.  I would expect this to help with the back pain and also to help with the lower extremity pain.  Risks and benefits were explained.  I will see him, I believe, next week.

## 2023-05-06 NOTE — Op Note (Signed)
05/06/2023  9:51 PM  PATIENT:  William Farley  63 y.o. 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 male whom was taken to the operating room intubated, and placed under a general anesthetic without difficulty. A foley catheter was placed under sterile conditions. He was positioned prone on a Jackson table with all pressure points properly padded.  His lumbar region was prepped and draped in a sterile manner. I infiltrated 10cc's 1/2%lidocaine/1:2000,000 strength epinephrine into the planned incision. I opened the skin with a 10 blade and took the incision down to the thoracolumbar fascia. I exposed the lamina of L3,4,and 5 in a subperiosteal fashion bilaterally. I confirmed my location with an intraoperative xray.  I placed self retaining retractors and started the decompression.  I decompressed the spinal canal via a laminectomy and inferior facetectomies of L4 on both sides. I also performed partial superior facetectomies of L5 on both sides.  During the decompression there was a dural tear. I repaired the durotomy primarily with prolene suture. Valsalva maneuvers did not reveal a leak post repair. PLIF's were performed at L4/5 in the same fashion. I opened  the disc space with a 15 blade then used a variety of instruments to remove the disc and prepare the space for the arthrodesis. I used curettes, rongeurs, punches, shavers for the disc space, and rasps in the discetomy. I measured the disc space and placed 2 titanium expandable cages packed with autograft morsels  (Half Dome X) into the disc space(s) along with autograft morsels in the disc space.   I placed pedicle screws at L4 and L5, using fluoroscopic guidance. I drilled a pilot hole, then cannulated the pedicle with a drill at each site. I then tapped each pedicle, assessing each site for pedicle violations. No cutouts were appreciated. Screws 7.44mm x 18mm(nuvasive mas plif) were then placed at each site without difficulty. I attached rods and locking caps with the appropriate tools. The locking caps were secured with torque limited screwdrivers. Final films were performed and the final construct appeared to be in good position.  I closed the wound in a layered fashion. I approximated the thoracolumbar fascia, subcutaneous, and subcuticular planes with vicryl sutures. I used dermabond and an occlusive bandage for a sterile dressing.     PLAN OF CARE: Admit to inpatient   PATIENT DISPOSITION:  PACU - hemodynamically stable.   Delay start of Pharmacological VTE agent (>24hrs) due to surgical blood loss or risk of bleeding:  yes

## 2023-05-06 NOTE — Transfer of Care (Signed)
Immediate Anesthesia Transfer of Care Note  Patient: William Farley  Procedure(s) Performed: POSTERIOR LUMBAR INTERBODY FUSION LUMBAR FOUR-FIVE POSTERIOR LATERAL AND INTERBODY FUSION  Patient Location: PACU  Anesthesia Type:General  Level of Consciousness: awake, alert , and oriented  Airway & Oxygen Therapy: Patient Spontanous Breathing and Patient connected to face mask oxygen  Post-op Assessment: Report given to RN and Post -op Vital signs reviewed and stable  Post vital signs: Reviewed and stable  Last Vitals:  Vitals Value Taken Time  BP 110/76   Temp 97.7   Pulse 82 05/06/23 2027  Resp 13 05/06/23 2027  SpO2 96 % 05/06/23 2027  Vitals shown include unfiled device data.  Last Pain:  Vitals:   05/06/23 1142  TempSrc:   PainSc: 10-Worst pain ever         Complications: No notable events documented.

## 2023-05-06 NOTE — Anesthesia Procedure Notes (Signed)
Procedure Name: Intubation Date/Time: 05/06/2023 2:50 PM  Performed by: Sudie Grumbling, CRNAPre-anesthesia Checklist: Patient identified, Emergency Drugs available, Suction available and Patient being monitored Patient Re-evaluated:Patient Re-evaluated prior to induction Oxygen Delivery Method: Circle system utilized Preoxygenation: Pre-oxygenation with 100% oxygen Induction Type: IV induction Ventilation: Mask ventilation without difficulty Laryngoscope Size: Mac and 4 Grade View: Grade I Tube type: Oral Tube size: 7.5 mm Number of attempts: 1 Airway Equipment and Method: Stylet and Oral airway Placement Confirmation: ETT inserted through vocal cords under direct vision, positive ETCO2 and breath sounds checked- equal and bilateral Secured at: 22 cm Tube secured with: Tape Dental Injury: Teeth and Oropharynx as per pre-operative assessment

## 2023-05-07 ENCOUNTER — Encounter (HOSPITAL_COMMUNITY): Payer: Self-pay | Admitting: Neurosurgery

## 2023-05-07 DIAGNOSIS — I10 Essential (primary) hypertension: Secondary | ICD-10-CM | POA: Diagnosis not present

## 2023-05-07 DIAGNOSIS — M48062 Spinal stenosis, lumbar region with neurogenic claudication: Secondary | ICD-10-CM | POA: Diagnosis not present

## 2023-05-07 DIAGNOSIS — Z96653 Presence of artificial knee joint, bilateral: Secondary | ICD-10-CM | POA: Diagnosis not present

## 2023-05-07 DIAGNOSIS — K219 Gastro-esophageal reflux disease without esophagitis: Secondary | ICD-10-CM | POA: Diagnosis not present

## 2023-05-07 DIAGNOSIS — Z9049 Acquired absence of other specified parts of digestive tract: Secondary | ICD-10-CM | POA: Diagnosis not present

## 2023-05-07 DIAGNOSIS — G4730 Sleep apnea, unspecified: Secondary | ICD-10-CM | POA: Diagnosis not present

## 2023-05-07 DIAGNOSIS — I2510 Atherosclerotic heart disease of native coronary artery without angina pectoris: Secondary | ICD-10-CM | POA: Diagnosis not present

## 2023-05-07 DIAGNOSIS — F32A Depression, unspecified: Secondary | ICD-10-CM | POA: Diagnosis not present

## 2023-05-07 DIAGNOSIS — E119 Type 2 diabetes mellitus without complications: Secondary | ICD-10-CM | POA: Diagnosis not present

## 2023-05-07 DIAGNOSIS — M4316 Spondylolisthesis, lumbar region: Secondary | ICD-10-CM | POA: Diagnosis not present

## 2023-05-07 DIAGNOSIS — F17220 Nicotine dependence, chewing tobacco, uncomplicated: Secondary | ICD-10-CM | POA: Diagnosis not present

## 2023-05-07 DIAGNOSIS — Z981 Arthrodesis status: Secondary | ICD-10-CM | POA: Diagnosis not present

## 2023-05-07 DIAGNOSIS — Z79899 Other long term (current) drug therapy: Secondary | ICD-10-CM | POA: Diagnosis not present

## 2023-05-07 DIAGNOSIS — F419 Anxiety disorder, unspecified: Secondary | ICD-10-CM | POA: Diagnosis not present

## 2023-05-07 LAB — CBC
HCT: 31.1 % — ABNORMAL LOW (ref 39.0–52.0)
Hemoglobin: 10.5 g/dL — ABNORMAL LOW (ref 13.0–17.0)
MCH: 30.7 pg (ref 26.0–34.0)
MCHC: 33.8 g/dL (ref 30.0–36.0)
MCV: 90.9 fL (ref 80.0–100.0)
Platelets: 169 10*3/uL (ref 150–400)
RBC: 3.42 MIL/uL — ABNORMAL LOW (ref 4.22–5.81)
RDW: 13.4 % (ref 11.5–15.5)
WBC: 9.9 10*3/uL (ref 4.0–10.5)
nRBC: 0 % (ref 0.0–0.2)

## 2023-05-07 LAB — CREATININE, SERUM
Creatinine, Ser: 0.92 mg/dL (ref 0.61–1.24)
GFR, Estimated: >60 mL/min (ref >60)

## 2023-05-07 MED ORDER — TIZANIDINE HCL 4 MG PO TABS: 4.0000 mg | ORAL_TABLET | Freq: Four times a day (QID) | ORAL | 0 refills | Status: DC | PRN

## 2023-05-07 MED ORDER — OXYCODONE HCL 5 MG PO TABS: 5.0000 mg | ORAL_TABLET | ORAL | 0 refills | Status: DC | PRN

## 2023-05-07 NOTE — Discharge Instructions (Addendum)
        Wound Care Keep incision covered and dry until post op day 3. You may remove the Honeycomb dressing on post op day 3. Leave glue on back.  They will fall off by themselves. Do not put any creams, lotions, or ointments on incision. You are fine to shower. Let water run over incision and pat dry.   Activity Walk each and every day, increasing distance each day. No lifting greater than 8 lbs.  No lifting no bending no twisting no driving    Diet Resume your normal diet.   Return to Work Will be discussed at your follow up appointment.  Call Your Doctor If Any of These Occur Redness, drainage, or swelling at the wound.  Temperature greater than 101 degrees. Severe pain not relieved by pain medication. Incision starts to come apart.  Follow Up Appt Call [phone] if you have one or any problem.              Spinal Fusion Care After Refer to this sheet in the next few weeks. These instructions provide you with information on caring for yourself after your procedure. Your caregiver may also give you more specific instructions. Your treatment has been planned according to current medical practices, but problems sometimes occur. Call your caregiver if you have any problems or questions after your procedure. HOME CARE INSTRUCTIONS  Take whatever pain medicine has been prescribed by your caregiver. Do not take over-the-counter pain medicine unless directed otherwise by your caregiver.  Do not drive if you are taking narcotic pain medicines.  Change your bandage (dressing) if necessary or as directed by your caregiver.  You may shower. The wound may get wet, simply pat the area dry. It will take ~2 weeks for the glue to peel off. If you have been prescribed medicine to prevent your blood from clotting, follow the directions carefully.  Check the area around your incision often. Look for redness and swelling. Also, look for anything leaking from your wound. You  can use a mirror or have a family member inspect your incision if it is in a place where it is difficult for you to see.  Ask your caregiver what activities you should avoid and for how long.  Walk as much as possible.  Do not lift anything heavier than 5 lbs until your caregiver says it is safe.  Do not twist or bend for a few weeks. Try not to pull on things. Avoid sitting for long periods of time. Change positions at least every hour.

## 2023-05-07 NOTE — Plan of Care (Signed)
Problem: Education: Goal: Knowledge of General Education information will improve Description: Including pain rating scale, medication(s)/side effects and non-pharmacologic comfort measures 05/07/2023 1151 by Vincente Liberty, RN Outcome: Completed/Met 05/07/2023 0841 by Vincente Liberty, RN Outcome: Progressing   Problem: Health Behavior/Discharge Planning: Goal: Ability to manage health-related needs will improve 05/07/2023 1151 by Vincente Liberty, RN Outcome: Completed/Met 05/07/2023 0841 by Vincente Liberty, RN Outcome: Progressing   Problem: Clinical Measurements: Goal: Ability to maintain clinical measurements within normal limits will improve 05/07/2023 1151 by Vincente Liberty, RN Outcome: Completed/Met 05/07/2023 0841 by Vincente Liberty, RN Outcome: Progressing Goal: Will remain free from infection 05/07/2023 1151 by Vincente Liberty, RN Outcome: Completed/Met 05/07/2023 0841 by Vincente Liberty, RN Outcome: Progressing Goal: Diagnostic test results will improve 05/07/2023 1151 by Vincente Liberty, RN Outcome: Completed/Met 05/07/2023 0841 by Vincente Liberty, RN Outcome: Progressing Goal: Respiratory complications will improve 05/07/2023 1151 by Vincente Liberty, RN Outcome: Completed/Met 05/07/2023 0841 by Vincente Liberty, RN Outcome: Progressing Goal: Cardiovascular complication will be avoided 05/07/2023 1151 by Vincente Liberty, RN Outcome: Completed/Met 05/07/2023 0841 by Vincente Liberty, RN Outcome: Progressing   Problem: Activity: Goal: Risk for activity intolerance will decrease 05/07/2023 1151 by Vincente Liberty, RN Outcome: Completed/Met 05/07/2023 0841 by Vincente Liberty, RN Outcome: Progressing   Problem: Nutrition: Goal: Adequate nutrition will be maintained 05/07/2023 1151 by Vincente Liberty, RN Outcome: Completed/Met 05/07/2023 0841 by Vincente Liberty, RN Outcome: Progressing   Problem: Coping: Goal: Level of anxiety will decrease 05/07/2023  1151 by Vincente Liberty, RN Outcome: Completed/Met 05/07/2023 0841 by Vincente Liberty, RN Outcome: Progressing   Problem: Elimination: Goal: Will not experience complications related to bowel motility 05/07/2023 1151 by Vincente Liberty, RN Outcome: Completed/Met 05/07/2023 0841 by Vincente Liberty, RN Outcome: Progressing Goal: Will not experience complications related to urinary retention 05/07/2023 1151 by Vincente Liberty, RN Outcome: Completed/Met 05/07/2023 0841 by Vincente Liberty, RN Outcome: Progressing   Problem: Pain Managment: Goal: General experience of comfort will improve and/or be controlled 05/07/2023 1151 by Vincente Liberty, RN Outcome: Completed/Met 05/07/2023 0841 by Vincente Liberty, RN Outcome: Progressing   Problem: Safety: Goal: Ability to remain free from injury will improve 05/07/2023 1151 by Vincente Liberty, RN Outcome: Completed/Met 05/07/2023 0841 by Vincente Liberty, RN Outcome: Progressing   Problem: Skin Integrity: Goal: Risk for impaired skin integrity will decrease 05/07/2023 1151 by Vincente Liberty, RN Outcome: Completed/Met 05/07/2023 0841 by Vincente Liberty, RN Outcome: Progressing   Problem: Education: Goal: Ability to verbalize activity precautions or restrictions will improve 05/07/2023 1151 by Vincente Liberty, RN Outcome: Completed/Met 05/07/2023 0841 by Vincente Liberty, RN Outcome: Progressing Goal: Knowledge of the prescribed therapeutic regimen will improve 05/07/2023 1151 by Vincente Liberty, RN Outcome: Completed/Met 05/07/2023 0841 by Vincente Liberty, RN Outcome: Progressing Goal: Understanding of discharge needs will improve 05/07/2023 1151 by Vincente Liberty, RN Outcome: Completed/Met 05/07/2023 0841 by Vincente Liberty, RN Outcome: Progressing   Problem: Activity: Goal: Ability to avoid complications of mobility impairment will improve 05/07/2023 1151 by Vincente Liberty, RN Outcome: Completed/Met 05/07/2023 0841 by  Vincente Liberty, RN Outcome: Progressing Goal: Ability to tolerate increased activity will improve 05/07/2023 1151 by Vincente Liberty, RN Outcome: Completed/Met 05/07/2023 0841 by Vincente Liberty, RN Outcome: Progressing Goal: Will remain free from falls 05/07/2023 1151 by Vincente Liberty, RN Outcome: Completed/Met 05/07/2023 0841 by Vincente Liberty, RN Outcome: Progressing   Problem: Bowel/Gastric: Goal: Gastrointestinal status for postoperative course will improve 05/07/2023 1151 by Vincente Liberty, RN Outcome: Completed/Met 05/07/2023 0841 by Vincente Liberty, RN Outcome: Progressing   Problem: Clinical Measurements: Goal: Ability to  maintain clinical measurements within normal limits will improve 05/07/2023 1151 by Vincente Liberty, RN Outcome: Completed/Met 05/07/2023 0841 by Vincente Liberty, RN Outcome: Progressing Goal: Postoperative complications will be avoided or minimized 05/07/2023 1151 by Vincente Liberty, RN Outcome: Completed/Met 05/07/2023 0841 by Vincente Liberty, RN Outcome: Progressing Goal: Diagnostic test results will improve 05/07/2023 1151 by Vincente Liberty, RN Outcome: Completed/Met 05/07/2023 0841 by Vincente Liberty, RN Outcome: Progressing   Problem: Pain Management: Goal: Pain level will decrease 05/07/2023 1151 by Vincente Liberty, RN Outcome: Completed/Met 05/07/2023 0841 by Vincente Liberty, RN Outcome: Progressing   Problem: Skin Integrity: Goal: Will show signs of wound healing 05/07/2023 1151 by Vincente Liberty, RN Outcome: Completed/Met 05/07/2023 0841 by Vincente Liberty, RN Outcome: Progressing   Problem: Bladder/Genitourinary: Goal: Urinary functional status for postoperative course will improve 05/07/2023 1151 by Vincente Liberty, RN Outcome: Completed/Met 05/07/2023 0841 by Vincente Liberty, RN Outcome: Progressing

## 2023-05-07 NOTE — Progress Notes (Signed)
Patient alert and oriented, mae's well, voiding adequate amount of urine, swallowing without difficulty, no c/o pain at time of discharge. Patient discharged home with family. Script and discharged instructions given to patient. Patient and family stated understanding of instructions given. Patient has an appointment with Dr. Franky Macho

## 2023-05-07 NOTE — Anesthesia Postprocedure Evaluation (Signed)
Anesthesia Post Note  Patient: William Farley  Procedure(s) Performed: POSTERIOR LUMBAR INTERBODY FUSION LUMBAR FOUR-FIVE POSTERIOR LATERAL AND INTERBODY FUSION     Patient location during evaluation: PACU Anesthesia Type: General Level of consciousness: awake and alert Pain management: pain level controlled Vital Signs Assessment: post-procedure vital signs reviewed and stable Respiratory status: spontaneous breathing, nonlabored ventilation, respiratory function stable and patient connected to nasal cannula oxygen Cardiovascular status: blood pressure returned to baseline and stable Postop Assessment: no apparent nausea or vomiting Anesthetic complications: no   No notable events documented.  Last Vitals:  Vitals:   05/07/23 0434 05/07/23 0752  BP: 124/77 104/72  Pulse: 88 87  Resp: 20 20  Temp: 36.7 C 36.9 C  SpO2: 100% 99%    Last Pain:  Vitals:   05/07/23 0752  TempSrc: Oral  PainSc:                  Kennieth Rad

## 2023-05-07 NOTE — Progress Notes (Signed)
PT Cancellation Note  Patient Details Name: William Farley MRN: [id number] DOB: 21-Jan-1961   Cancelled Treatment:    Reason Eval/Treat Not Completed: PT screened, no needs identified, will sign off. Pt observed walking with OT, and per OT, pt independent with all mobility, able to ambulate functional distances, complete a flight of stairs, and has no need for DME or assistance. No further acute PT needs at this time.   Vickki Muff, PT, DPT   Acute Rehabilitation Department Office [phone] Secure Chat Communication Preferred   Ronnie Derby 05/07/2023, 8:40 AM

## 2023-05-07 NOTE — Discharge Summary (Signed)
Physician Discharge Summary  Patient ID: William Farley MRN: [id number] DOB/AGE: [birth year] 63 y.o.  Admit date: 05/06/2023 Discharge date: 05/07/2023  Admission Diagnoses:lumbar stenosis with neurogenic claudication L4/5 Lumbar spondylolisthesis L4/5  Discharge Diagnoses: same Principal Problem:   Lumbar stenosis with neurogenic claudication   Discharged Condition: good  Hospital Course: William Farley was admitted and taken to the operating room for an uncomplicated lumbar PLIF at L4/5 with pedicle screw placement. A durotomy was closed primarily and wound is flat, dry, and without signs of infection at discharge. He is ambulating, voiding, and tolerating a regular diet. Half dome x cages, nuvasive screws Treatments: surgery:   Discharge Exam: Blood pressure 104/72, pulse 87, temperature 98.4 F (36.9 C), temperature source Oral, resp. rate 20, height 5\' 11"  (1.803 m), weight 98.6 kg, SpO2 99%. General appearance: alert, cooperative, appears stated age, and no distress  Disposition: Discharge disposition: 01-Home or Self Care      Spondylolisthesis  Allergies as of 05/07/2023       Reactions   Codeine Itching, Rash, Other (See Comments)        Medication List     STOP taking these medications    cyclobenzaprine 10 MG tablet Commonly known as: FLEXERIL       TAKE these medications    allopurinol 300 MG tablet Commonly known as: ZYLOPRIM Take 1 tablet (300 mg total) by mouth daily.   Dexcom G7 Sensor Misc Inject 1 Application into the skin as directed. Change sensor every 10 days as directed.   DULoxetine 60 MG capsule Commonly known as: CYMBALTA Take 60 mg by mouth 2 (two) times daily.   LORazepam 1 MG tablet Commonly known as: ATIVAN Take 1 mg by mouth 3 (three) times daily as needed for anxiety.   metoprolol succinate 25 MG 24 hr tablet Commonly known as: TOPROL-XL Take 1 tablet (25 mg total) by mouth daily.   NON FORMULARY Pt uses a cpap  nightly   omeprazole 20 MG capsule Commonly known as: PRILOSEC Take 20 mg by mouth in the morning.   oxyCODONE 5 MG immediate release tablet Commonly known as: Oxy IR/ROXICODONE Take 1 tablet (5 mg total) by mouth every 3 (three) hours as needed for moderate pain (pain score 4-6).   tamsulosin 0.4 MG Caps capsule Commonly known as: FLOMAX Take 1 capsule (0.4 mg total) by mouth daily after supper.   tiZANidine 4 MG tablet Commonly known as: ZANAFLEX Take 1 tablet (4 mg total) by mouth every 6 (six) hours as needed for muscle spasms.   VITAMIN B12 PO Take 1 tablet by mouth every other day.   Vitamin D (Ergocalciferol) 1.25 MG (50000 UNIT) Caps capsule Commonly known as: DRISDOL Take 1 capsule (50,000 Units total) by mouth every 7 (seven) days.   zolpidem 10 MG tablet Commonly known as: AMBIEN Take 10 mg by mouth at bedtime.        Follow-up Information     Coletta Memos, MD Follow up.   Specialty: Neurosurgery Why: keep your scheduled appointment Contact information: [address] [place] [address] [phone]                 Signed: Coletta Memos 05/07/2023, 10:46 AM

## 2023-05-07 NOTE — Evaluation (Signed)
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 PHMx: appendectomy, cholecystectomy, herniorrhaphy, cervical fusion, Bil knee replacements, back/neck pain, current tobacco use, anxiety, depression, suicidal ideation.     Clinical Impressions This 63 yo male admitted and underwent above presents to acute OT with all education completed and post op surgical back handout provided, no further OT needs and no PT needs identified. Acute OT will sign off.     If plan is discharge home, recommend the following:   Assist for transportation     Functional Status Assessment   Patient has had a recent decline in their functional status and demonstrates the ability to make significant improvements in function in a reasonable and predictable amount of time. (without further OT needs)     Equipment Recommendations   None recommended by OT      Precautions/Restrictions   Precautions Precautions: Back Precaution Booklet Issued: Yes (comment) Recall of Precautions/Restrictions: Impaired Precaution/Restrictions Comments: follows the no bending and lifting but needs VCs for no twisiting Restrictions Weight Bearing Restrictions Per Provider Order: No     Mobility Bed Mobility Overal bed mobility: Modified Independent             General bed mobility comments: Educated on use in and OOB technique    Transfers Overall transfer level: Modified independent                        Balance Overall balance assessment: Independent                                         ADL either performed or assessed with clinical judgement   ADL Overall ADL's : Modified independent                                        General ADL Comments: Educated on use of 2 cups for brushing teeth, use of wet wipes for back peri care, sit<>stand stance that keeps back more straight, use of pillows when in bed, building up sitting tolerance, and LB B/D techniques     Vision Patient Visual Report: No change from baseline              Pertinent Vitals/Pain Pain Assessment Pain Assessment: 0-10 Pain Score: 8  Pain Location: incisional Pain Descriptors / Indicators: Aching, Sore Pain Intervention(s): Limited activity within patient's tolerance, Monitored during session     Extremity/Trunk Assessment Upper Extremity Assessment Upper Extremity Assessment: Overall WFL for tasks assessed              Cognition Arousal: Alert Behavior During Therapy: WFL for tasks assessed/performed Cognition: No apparent impairments                                                  Home Living Family/patient expects to be discharged to:: Private residence Living Arrangements: Spouse/significant other Available Help at Discharge: Family Type  of Home: House Home Access: Stairs to enter [employer] of Steps: 1 Entrance Stairs-Rails: None Home Layout: One level     Bathroom Shower/Tub: IT trainer: Standard     Home Equipment: Cane - single point;Crutches          Prior Functioning/Environment Prior Level of Function : Independent/Modified Independent;Driving                    OT Problem List: Decreased range of motion;Pain        OT Goals(Current goals can be found in the care plan section)   Acute Rehab OT Goals Patient Stated Goal: to go home today         AM-PAC OT "6 Clicks" Daily Activity     Outcome Measure Help from another person eating meals?: None Help from another person taking care of personal grooming?: None Help from another person toileting, which includes using toliet, bedpan, or urinal?:  None Help from another person bathing (including washing, rinsing, drying)?: None Help from another person to put on and taking off regular upper body clothing?: None Help from another person to put on and taking off regular lower body clothing?: None 6 Click Score: 24   End of Session Nurse Communication:  (no further OT needs and not PT needs identified--via secure chat)  Activity Tolerance: Patient tolerated treatment well Patient left: in chair  OT Visit Diagnosis: Pain Pain - part of body:  (incisional)                Time: 7829-5621 OT Time Calculation (min): 34 min Charges:  OT General Charges $OT Visit: 1 Visit OT Evaluation $OT Eval Moderate Complexity: 1 Mod OT Treatments $Self Care/Home Management : 8-22 mins  Lindon Romp OT Acute Rehabilitation Services Office [phone]    Evette Georges 05/07/2023, 9:43 AM

## 2023-05-07 NOTE — Plan of Care (Signed)
  Problem: Education: Goal: Knowledge of General Education information will improve Description: Including pain rating scale, medication(s)/side effects and non-pharmacologic comfort measures Outcome: Progressing   Problem: Health Behavior/Discharge Planning: Goal: Ability to manage health-related needs will improve Outcome: Progressing   Problem: Clinical Measurements: Goal: Ability to maintain clinical measurements within normal limits will improve Outcome: Progressing Goal: Will remain free from infection Outcome: Progressing Goal: Diagnostic test results will improve Outcome: Progressing Goal: Respiratory complications will improve Outcome: Progressing Goal: Cardiovascular complication will be avoided Outcome: Progressing   Problem: Activity: Goal: Risk for activity intolerance will decrease Outcome: Progressing   Problem: Nutrition: Goal: Adequate nutrition will be maintained Outcome: Progressing   Problem: Coping: Goal: Level of anxiety will decrease Outcome: Progressing   Problem: Elimination: Goal: Will not experience complications related to bowel motility Outcome: Progressing Goal: Will not experience complications related to urinary retention Outcome: Progressing   Problem: Pain Managment: Goal: General experience of comfort will improve and/or be controlled Outcome: Progressing   Problem: Safety: Goal: Ability to remain free from injury will improve Outcome: Progressing   Problem: Skin Integrity: Goal: Risk for impaired skin integrity will decrease Outcome: Progressing   Problem: Education: Goal: Ability to verbalize activity precautions or restrictions will improve Outcome: Progressing Goal: Knowledge of the prescribed therapeutic regimen will improve Outcome: Progressing Goal: Understanding of discharge needs will improve Outcome: Progressing   Problem: Activity: Goal: Ability to avoid complications of mobility impairment will  improve Outcome: Progressing Goal: Ability to tolerate increased activity will improve Outcome: Progressing Goal: Will remain free from falls Outcome: Progressing   Problem: Bowel/Gastric: Goal: Gastrointestinal status for postoperative course will improve Outcome: Progressing   Problem: Clinical Measurements: Goal: Ability to maintain clinical measurements within normal limits will improve Outcome: Progressing Goal: Postoperative complications will be avoided or minimized Outcome: Progressing Goal: Diagnostic test results will improve Outcome: Progressing   Problem: Pain Management: Goal: Pain level will decrease Outcome: Progressing   Problem: Skin Integrity: Goal: Will show signs of wound healing Outcome: Progressing   Problem: Bladder/Genitourinary: Goal: Urinary functional status for postoperative course will improve Outcome: Progressing

## 2023-05-08 IMAGING — CT CT HEART MORP W/ CTA COR W/ SCORE W/ CA W/CM &/OR W/O CM
1 series · 10 of 12 positions shown, 13 images · non-contrast
Comparison: None.

Addendum:
CLINICAL DATA: 60M with dyspnea, syncope

EXAM:
Cardiac/Coronary CTA
TECHNIQUE: The patient was scanned on a [employer] Force scanner.

[Series 1934: coronaries · 10 of 12 slices shown, 13 images]
[im 2/12  vessel]
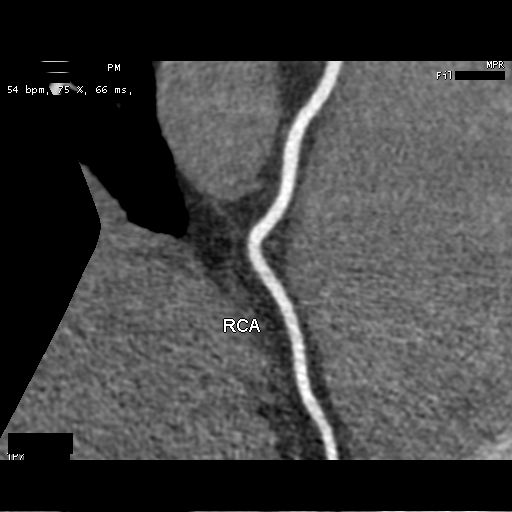
[im 2/12  lung]
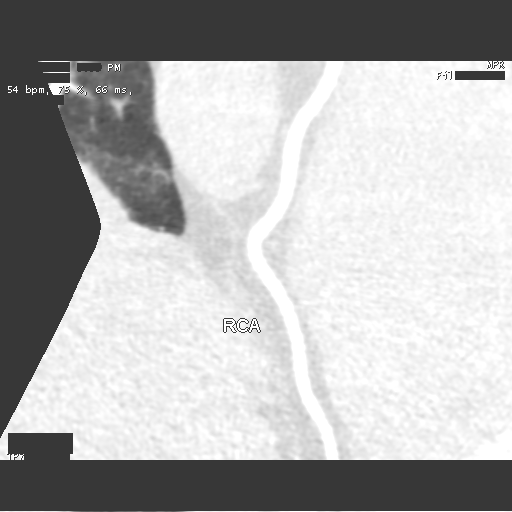
[im 3/12  vessel]
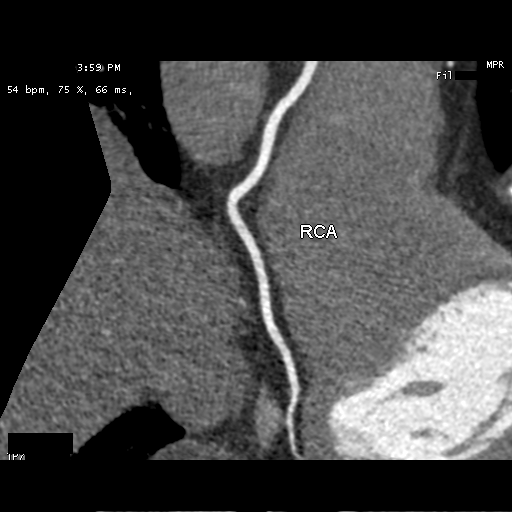
[im 4/12  vessel]
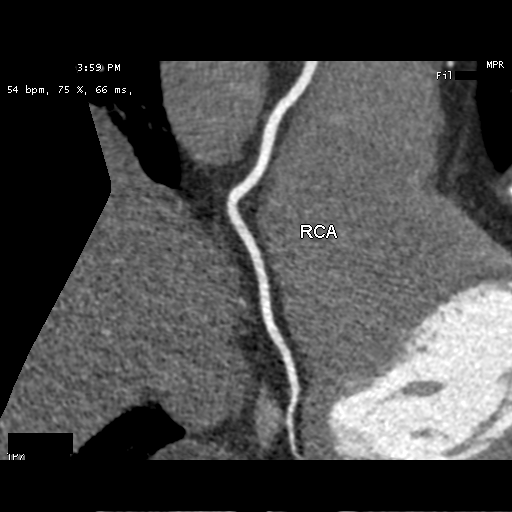
[im 5/12  vessel]
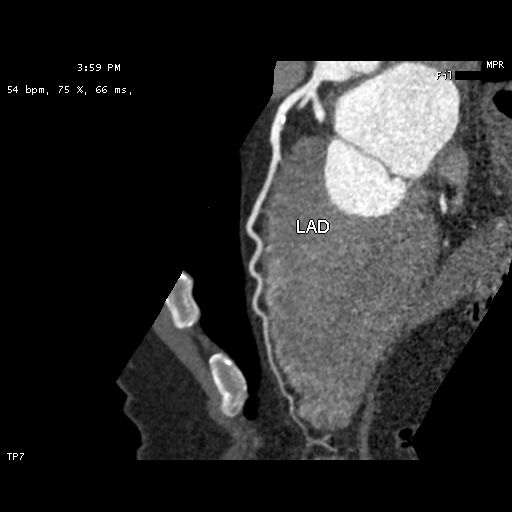
[im 6/12  vessel]
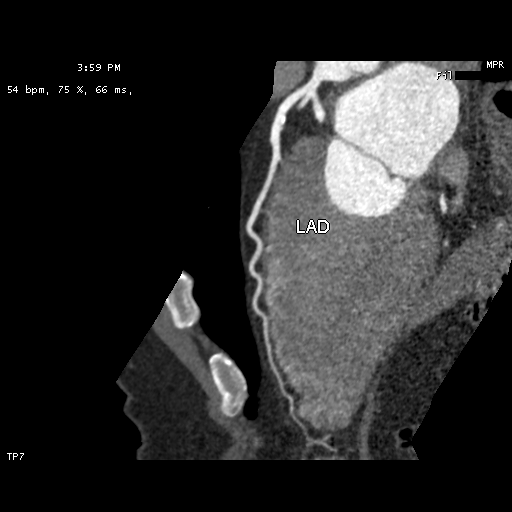
[im 6/12  lung]
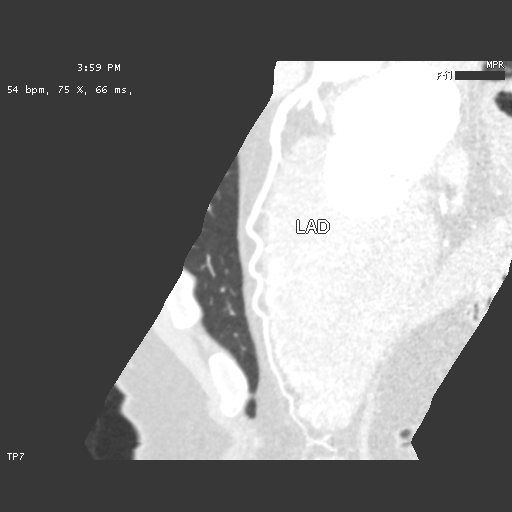
[im 7/12  vessel]
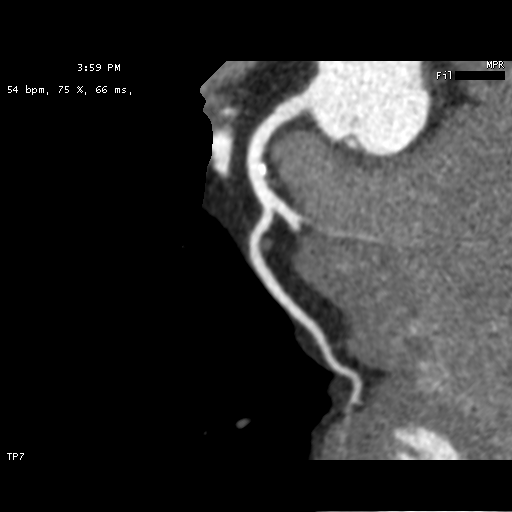
[im 8/12  vessel]
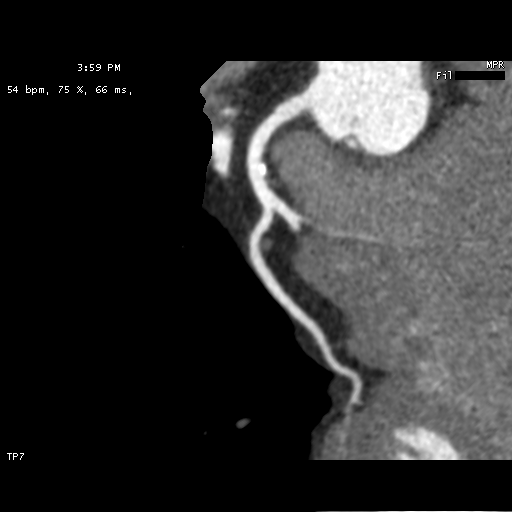
[im 9/12  vessel]
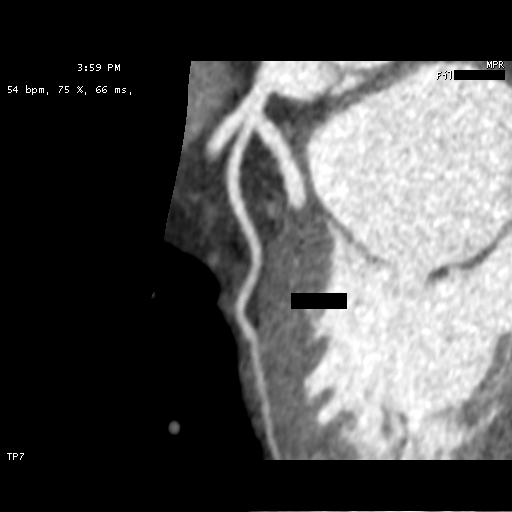
[im 10/12  vessel]
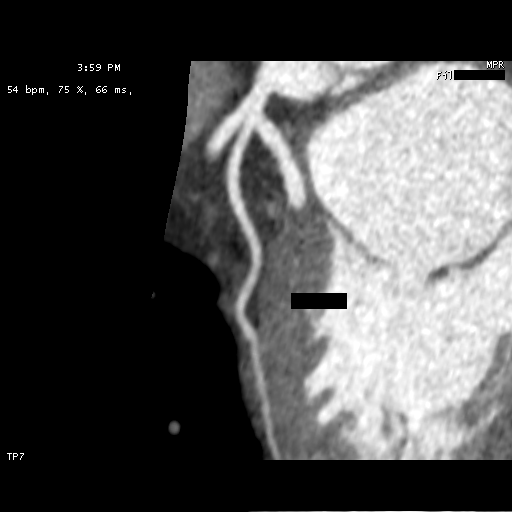
[im 10/12  lung]
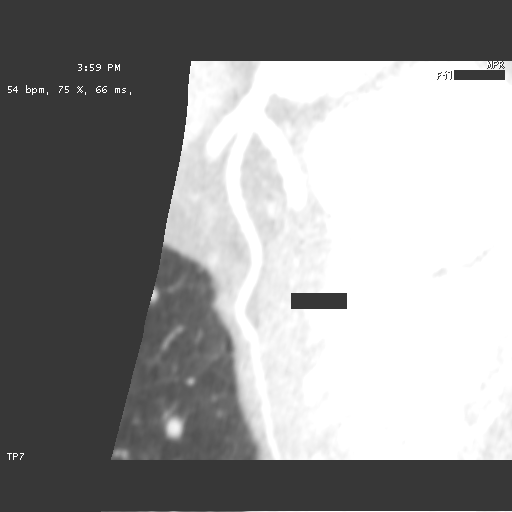
[im 11/12  vessel]
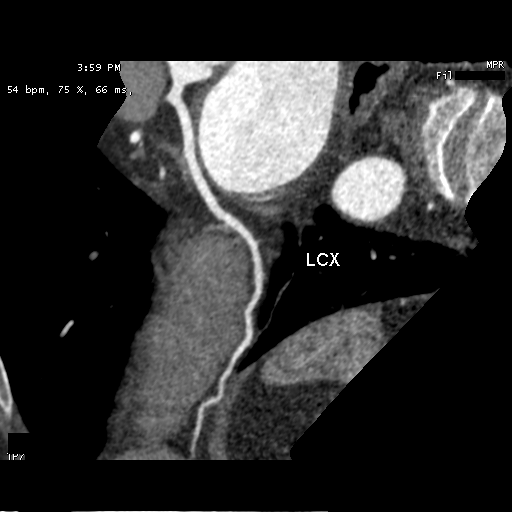

[10 of 12 positions shown; findings below may reference images not displayed]

FINDINGS: A 100 kV prospective scan was triggered in the descending thoracic
aorta at 111 HU's. Axial non-contrast 3 mm slices were carried out
through the heart. The data set was analyzed on a dedicated work
station and scored using the Agatson method. Gantry rotation speed
was 250 msecs and collimation was .6 mm. 0.8 mg of sl NTG was given.
The 3D data set was reconstructed in 5% intervals of the 35-75 % of
the R-R cycle. Phases were analyzed on a dedicated work station
using MPR, MIP and VRT modes. The patient received 80 cc of
contrast.

Coronary Arteries:  Normal coronary origin.  Right dominance.

RCA is a large dominant artery that gives rise to PDA and PLA. There
is no plaque.

Left main is a large artery that gives rise to LAD and LCX arteries.

LAD is a large vessel. Calcified plaque in proximal LAD causes 0-24%
stenosis

LCX is a non-dominant artery that gives rise to one large OM1
branch. There is no plaque.

There is a ramus with no plaque

Other findings:

Left Ventricle: Normal size

Left Atrium: Mild enlargement

Pulmonary Veins: Normal configuration

Right Ventricle: Mild dilatation

Right Atrium: Mild enlargement

Cardiac valves: No calcifications

Thoracic aorta: Normal size

Pulmonary Arteries: Normal size

Systemic Veins: Normal drainage

Pericardium: Normal thickness
IMPRESSION: 1. Coronary calcium score of 19. This was 50th percentile for age
and sex matched control.

2.  Normal coronary origin with right dominance.

3. Nonobstructive CAD, with calcified plaque in proximal LAD causing
minimal (0-24%) stenosis.

CAD-RADS 1. Minimal non-obstructive CAD (0-24%). Consider
non-atherosclerotic causes of chest pain. Consider preventive
therapy and risk factor modification.

EXAM:
OVER-READ INTERPRETATION  CT CHEST

The following report is an over-read performed by radiologist Dr.
does not include interpretation of cardiac or coronary anatomy or
pathology. The coronary calcium score/coronary CTA interpretation by
the cardiologist is attached.
FINDINGS: No suspicious nodules, masses, or infiltrates are identified in the
visualized portion of the lungs. Mild scarring noted in both lung
bases. No pleural fluid seen.

Prior gastric bypass surgery is noted, with a small hiatal hernia.

:
Prior gastric bypass surgery, with small hiatal hernia noted.

*** End of Addendum ***
FINDINGS: A 100 kV prospective scan was triggered in the descending thoracic
aorta at 111 HU's. Axial non-contrast 3 mm slices were carried out
through the heart. The data set was analyzed on a dedicated work
station and scored using the Agatson method. Gantry rotation speed
was 250 msecs and collimation was .6 mm. 0.8 mg of sl NTG was given.
The 3D data set was reconstructed in 5% intervals of the 35-75 % of
the R-R cycle. Phases were analyzed on a dedicated work station
using MPR, MIP and VRT modes. The patient received 80 cc of
contrast.

Coronary Arteries:  Normal coronary origin.  Right dominance.

RCA is a large dominant artery that gives rise to PDA and PLA. There
is no plaque.

Left main is a large artery that gives rise to LAD and LCX arteries.

LAD is a large vessel. Calcified plaque in proximal LAD causes 0-24%
stenosis

LCX is a non-dominant artery that gives rise to one large OM1
branch. There is no plaque.

There is a ramus with no plaque

Other findings:

Left Ventricle: Normal size

Left Atrium: Mild enlargement

Pulmonary Veins: Normal configuration

Right Ventricle: Mild dilatation

Right Atrium: Mild enlargement

Cardiac valves: No calcifications

Thoracic aorta: Normal size

Pulmonary Arteries: Normal size

Systemic Veins: Normal drainage

Pericardium: Normal thickness
IMPRESSION: 1. Coronary calcium score of 19. This was 50th percentile for age
and sex matched control.

2.  Normal coronary origin with right dominance.

3. Nonobstructive CAD, with calcified plaque in proximal LAD causing
minimal (0-24%) stenosis.

CAD-RADS 1. Minimal non-obstructive CAD (0-24%). Consider
non-atherosclerotic causes of chest pain. Consider preventive
therapy and risk factor modification.

## 2023-05-17 ENCOUNTER — Encounter: Payer: Federal, State, Local not specified - PPO | Attending: Family Medicine | Admitting: Nutrition

## 2023-05-17 DIAGNOSIS — E162 Hypoglycemia, unspecified: Secondary | ICD-10-CM | POA: Insufficient documentation

## 2023-05-17 DIAGNOSIS — Z713 Dietary counseling and surveillance: Secondary | ICD-10-CM | POA: Diagnosis not present

## 2023-05-17 DIAGNOSIS — M4316 Spondylolisthesis, lumbar region: Secondary | ICD-10-CM | POA: Diagnosis not present

## 2023-05-17 NOTE — Progress Notes (Unsigned)
 Medical Nutrition Therapy  Appointment Start time:  1415  Appointment End time:  1515  Primary concerns today: Hypoglycemia  Referral diagnosis: E15.2 Preferred learning style: No preference  Learning readiness: Ready   NUTRITION ASSESSMENT  63 yr old wmale referred for hypoglycemia. Here with his wife. He reports having some blood sugars in the 40-50's randomly. PMH Gastric bypass, PTSD, HTN, Depression, Anxiety, Vit D deficiency Had Back surgery and lost appetite. Has chronic pain syndrome His highest weight was 310 lbs and had gastric bypass. He lost down to 158 lbs he reports, but has gained back to 217 lbs. He notes he didn't intend to drop so much weight after his gastric bypass but lost his appetite. [employer] isn't covered due to no diagnosis of DM.  He notes he can be a picky eater. Doesn't like a lot of foods. Not very active due to back issues. Does walk some.  He is willing to work on eating better balanced meals to include small snacks to meet his nutritional needs.  Clinical Medical Hx:  Past Medical History:  Diagnosis Date   Anxiety    Arthritis    CAD (coronary artery disease)    Minimal Non-obstructive CAD seen on coronary CTA 04/2021   Depression    GERD (gastroesophageal reflux disease)    History of kidney stones    History of thrombocytopenia    Hx of syncope    PAF (paroxysmal atrial fibrillation) (HCC)    ChadsVasc Score is 0.  Monitor revealed less than 1% PAF burden.   Seizure (HCC) 02/2021   Sleep apnea    wears CPAP     Medications:  Current Outpatient Medications on File Prior to Visit  Medication Sig Dispense Refill   allopurinol (ZYLOPRIM) 300 MG tablet Take 1 tablet (300 mg total) by mouth daily. 90 tablet 3   Continuous Glucose Sensor (DEXCOM G7 SENSOR) MISC Inject 1 Application into the skin as directed. Change sensor every 10 days as directed. 9 each 3   Cyanocobalamin (VITAMIN B12 PO) Take 1 tablet by mouth every other day.      DULoxetine (CYMBALTA) 60 MG capsule Take 60 mg by mouth 2 (two) times daily.     LORazepam (ATIVAN) 1 MG tablet Take 1 mg by mouth 3 (three) times daily as needed for anxiety.     metoprolol succinate (TOPROL-XL) 25 MG 24 hr tablet Take 1 tablet (25 mg total) by mouth daily. 90 tablet 1   NON FORMULARY Pt uses a cpap nightly     omeprazole (PRILOSEC) 20 MG capsule Take 20 mg by mouth in the morning.     oxyCODONE (OXY IR/ROXICODONE) 5 MG immediate release tablet Take 1 tablet (5 mg total) by mouth every 3 (three) hours as needed for moderate pain (pain score 4-6). 30 tablet 0   tamsulosin (FLOMAX) 0.4 MG CAPS capsule Take 1 capsule (0.4 mg total) by mouth daily after supper. 30 capsule 11   tiZANidine (ZANAFLEX) 4 MG tablet Take 1 tablet (4 mg total) by mouth every 6 (six) hours as needed for muscle spasms. 60 tablet 0   Vitamin D, Ergocalciferol, (DRISDOL) 1.25 MG (50000 UNIT) CAPS capsule Take 1 capsule (50,000 Units total) by mouth every 7 (seven) days. 20 capsule 1   zolpidem (AMBIEN) 10 MG tablet Take 10 mg by mouth at bedtime.     No current facility-administered medications on file prior to visit.    Labs:  Lab Results  Component Value Date   HGBA1C  5.3 03/26/2023      Latest Ref Rng & Units 05/07/2023   10:08 AM 05/04/2023   11:00 AM 04/09/2023   10:10 AM  CMP  Glucose 70 - 99 mg/dL  621  93   BUN 6 - 20 mg/dL  9  10   Creatinine 3.08 - 1.24 mg/dL 6.57  8.46  9.62   Sodium 135 - 145 mmol/L  138  142   Potassium 3.5 - 5.1 mmol/L  4.1  4.4   Chloride 98 - 111 mmol/L  107  102   CO2 22 - 32 mmol/L  27  23   Calcium 8.9 - 10.3 mg/dL  8.4  9.5   Total Protein 6.0 - 8.5 g/dL   7.0   Total Bilirubin 0.0 - 1.2 mg/dL   0.6   Alkaline Phos 44 - 121 IU/L   107   AST 0 - 40 IU/L   21   ALT 0 - 44 IU/L   14    Lipid Panel     Component Value Date/Time   CHOL 166 03/26/2023 0926   TRIG 61 03/26/2023 0926   HDL 65 03/26/2023 0926   CHOLHDL 2.6 03/26/2023 0926   LDLCALC 89  03/26/2023 0926   LABVLDL 12 03/26/2023 0926    Notable Signs/Symptoms: Low blood sugar symptoms a few times per week  Lifestyle & Dietary Hx LIves with his wife.  Estimated daily fluid intake: 80 oz Supplements:  Sleep:  Stress / self-care:  Current average weekly physical activity: Walks some  24-Hr Dietary Recall Eats 2-3 meals per day  Estimated Energy Needs Calories: 2000-2200 Carbohydrate: 225g Protein: 150g Fat: 56g   NUTRITION DIAGNOSIS  NI-5.8.4 Inconsistent carbohydrate intake As related to Hypoglycemia episodes.  As evidenced by blood sugars being in the 40-50's at times..   NUTRITION INTERVENTION  Nutrition education (E-1) on the following topics:  Nutrition and  hypoglycemia  discussed with My Plate, CHO counting, meal planning, portion sizes, timing of meals, avoiding snacks between meals unless having a low blood sugar, target ranges for A1C and blood sugars, signs/symptoms and treatment of hyper/hypoglycemia, monitoring blood sugars, taking medications as prescribed, benefits of exercising 30 minutes per day and prevention of complications of DM. Lifestyle Medicine  - Whole Food, Plant Predominant Nutrition is highly recommended: Eat Plenty of vegetables, Mushrooms, fruits, Legumes, Whole Grains, Nuts, seeds in lieu of processed meats, processed snacks/pastries red meat, poultry, eggs.    -It is better to avoid simple carbohydrates including: Cakes, Sweet Desserts, Ice Cream, Soda (diet and regular), Sweet Tea, Candies, Chips, Cookies, Store Bought Juices, Alcohol in Excess of  1-2 drinks a day, Lemonade,  Artificial Sweeteners, Doughnuts, Coffee Creamers, "Sugar-free" Products, etc, etc.  This is not a complete list.....  Exercise: If you are able: 30 -60 minutes a day ,4 days a week, or 150 minutes a week.  The longer the better.  Combine stretch, strength, and aerobic activities.  If you were told in the past that you have high risk for cardiovascular  diseases, you may seek evaluation by your heart doctor prior to initiating moderate to intense exercise programs.   Handouts Provided Include  LIfestyle Medicine Signs and treatment of hypoglycema  Learning Style & Readiness for Change Teaching method utilized: Visual & Auditory  Demonstrated degree of understanding via: Teach Back  Barriers to learning/adherence to lifestyle change: none  Goals Established by Pt Eat three balanced meals and 2 snack snacks per day Eat protein with eaach meal  and snack  Avoid simple sugars, or processed foods Don't skip meals. Eat 45-60 g CHO at each meals and 15-30 grams of protein with meals at least. Keep glucose tablets, hard candy or icing with you incase of blood sugar dropping. Prevent hypoglycema Eat high fiber foods.   MONITORING & EVALUATION Dietary intake, weekly physical activity, and blood sugars in 1 month.  Next Steps  Patient is to work on eating consistently balanced meals and healthy snacks as needed.Marland Kitchen

## 2023-05-18 NOTE — Telephone Encounter (Signed)
 TC to patient to inform him commerical insurance won't cover the prescription for a CGM due to not having a diagnosis of Type 2 DM and just having hypoglcyemic episodes. Informed in there is an OTC CGM that can be purchased if desired. He noted he was going to [facility] the information with [facility] to see if they would cover a CGM for him. He  verbalized understanding.

## 2023-05-20 ENCOUNTER — Encounter: Payer: Self-pay | Admitting: Nutrition

## 2023-05-20 NOTE — Patient Instructions (Signed)
  Goals Established by Pt Eat three balanced meals and 2 snack snacks per day Eat protein with eaach meal and snack  Avoid simple sugars, or processed foods Don't skip meals. Eat 45-60 g CHO at each meals and 15-30 grams of protein with meals at least. Keep glucose tablets, hard candy or icing with you incase of blood sugar dropping. Prevent hypoglycema Eat high fiber foods.

## 2023-05-24 ENCOUNTER — Encounter: Payer: Self-pay | Admitting: Podiatry

## 2023-05-24 ENCOUNTER — Ambulatory Visit: Admitting: Podiatry

## 2023-05-24 ENCOUNTER — Ambulatory Visit: Admitting: Internal Medicine

## 2023-05-24 ENCOUNTER — Ambulatory Visit (INDEPENDENT_AMBULATORY_CARE_PROVIDER_SITE_OTHER)

## 2023-05-24 DIAGNOSIS — M79671 Pain in right foot: Secondary | ICD-10-CM | POA: Diagnosis not present

## 2023-05-24 DIAGNOSIS — L03115 Cellulitis of right lower limb: Secondary | ICD-10-CM

## 2023-05-24 DIAGNOSIS — B353 Tinea pedis: Secondary | ICD-10-CM | POA: Diagnosis not present

## 2023-05-24 MED ORDER — CICLOPIROX 0.77 % EX GEL: 1.0000 | Freq: Two times a day (BID) | CUTANEOUS | 1 refills | Status: DC

## 2023-05-24 MED ORDER — CEPHALEXIN 500 MG PO CAPS: 500.0000 mg | ORAL_CAPSULE | Freq: Four times a day (QID) | ORAL | 0 refills | Status: DC

## 2023-05-24 MED ORDER — FLUCONAZOLE 150 MG PO TABS: 150.0000 mg | ORAL_TABLET | ORAL | 0 refills | Status: DC

## 2023-05-24 NOTE — Progress Notes (Unsigned)
 Subjective:   Patient ID: William Farley, male   DOB: 63 y.o.   MRN: [id number]   HPI Chief Complaint  Patient presents with   Foot Pain    Rm#12 Right foot pain whole between fourth and fifth toe.Ongoing issue not getting better.   Years drainage [facility] over the years  for "foot rot" but seems to be getitng worse.    ROS      Objective:  Physical Exam  ***     Assessment:  ***     Plan:  ***

## 2023-05-24 NOTE — Patient Instructions (Signed)

## 2023-06-03 ENCOUNTER — Other Ambulatory Visit: Payer: Self-pay | Admitting: Family Medicine

## 2023-06-07 ENCOUNTER — Encounter: Payer: Self-pay | Admitting: Podiatry

## 2023-06-07 ENCOUNTER — Other Ambulatory Visit: Payer: Self-pay | Admitting: Podiatry

## 2023-06-10 ENCOUNTER — Ambulatory Visit: Payer: Federal, State, Local not specified - PPO | Attending: Nurse Practitioner | Admitting: Nurse Practitioner

## 2023-06-10 ENCOUNTER — Encounter: Payer: Self-pay | Admitting: Nurse Practitioner

## 2023-06-10 ENCOUNTER — Encounter: Payer: Self-pay | Admitting: *Deleted

## 2023-06-10 VITALS — BP 98/62 | HR 64 | Ht 71.0 in | Wt 226.0 lb

## 2023-06-10 DIAGNOSIS — R5383 Other fatigue: Secondary | ICD-10-CM

## 2023-06-10 DIAGNOSIS — I2510 Atherosclerotic heart disease of native coronary artery without angina pectoris: Secondary | ICD-10-CM | POA: Diagnosis not present

## 2023-06-10 DIAGNOSIS — Z87898 Personal history of other specified conditions: Secondary | ICD-10-CM | POA: Diagnosis not present

## 2023-06-10 DIAGNOSIS — I959 Hypotension, unspecified: Secondary | ICD-10-CM

## 2023-06-10 DIAGNOSIS — I480 Paroxysmal atrial fibrillation: Secondary | ICD-10-CM | POA: Diagnosis not present

## 2023-06-10 MED ORDER — METOPROLOL SUCCINATE ER 25 MG PO TB24: 12.5000 mg | ORAL_TABLET | Freq: Every day | ORAL | Status: DC

## 2023-06-10 NOTE — Progress Notes (Signed)
 Cardiology Office Note:    Date:  06/10/2023 ID:  William Farley, DOB [birth year], MRN [id number] PCP:  Gilmore Laroche, FNP [facility] Providers Cardiologist:  Nona Dell, MD    Referring MD: Gilmore Laroche, FNP   CC: Here for 6 month follow-up appointment   History of Present Illness:    William Farley is a 63 y.o. male with a PMH of CAD, PAF, previous history of syncope and collapse, history of seizure disorder, history of thrombocytopenia, anxiety, chronic PTSD, who presents today for 6 for follow-up appointment.  In the past he was evaluated for syncopal event in 02/2021 that was attributed to his seizure,  got up to go to the bathroom and passed out, wife noted seizure-like activity. Workup at [facility] in [place] was overall negative, troponins negative, EKG negative.  CT scan and MRI of brain, normal.  EEG was normal.  Echocardiogram revealed normal LV function. Monitor revealed less than 1% burden of PAF, had multiple brief episodes of SVT.  Dr. Diona Browner who reviewed monitor was hesitant to start anticoagulation without further information.   Last seen by Dr. Flora Lipps in January 2023.  Pt stated he did not feel any A-fib and denied any tachycardia or palpitations.  Denied any chest pain or shortness of breath surrounding syncopal event, but did note intermittent SHOB, that was thought to be anxiety related. CHA2DS2-VASc score 0.  Etiology unclear for syncopal event, denied any recurrences of syncope.  Started on Toprol XL 25 mg daily to prevent any fast heart rates.  Underwent coronary CTA that revealed coronary calcium score 19, 50th percent for his demographic, nonobstructive CAD with calcified plaque in proximal LAD causing minimal (0 to 24%) stenosis.  Was told to follow-up in 6 months.  I last saw him for follow-up on February 19, 2022.  He was pending future left total knee arthroplasty at this time.  Was overall doing well.    Today presents for follow-up.   Admits to fatigue, partly attributes this to his episodes of hypoglycemia.  Says he is no longer on CGM.  Says he takes a nightly meal to help prevent symptoms at night.  He is being followed by endocrinology for this. Denies any chest pain, shortness of breath, palpitations, syncope, presyncope, dizziness, orthopnea, PND, swelling or significant weight changes, acute bleeding, or claudication.  Does not check his BP at home but says BP readings at office visits have been lower recently.   SH: Officially retired from the [employer].  Please see the history of present illness.    All other systems reviewed and are negative.  EKGs/Labs/Other Studies Reviewed:    The following studies were reviewed today:   EKG:  EKG is not ordered today.   MRI of brain on May 24, 2021: Normal brain MRI.  Coronary CTA on April 30, 2021: 1. Coronary calcium score of 19. This was 50th percentile for age and sex matched control. 2.  Normal coronary origin with right dominance. 3. Nonobstructive CAD, with calcified plaque in proximal LAD causing minimal (0-24%) stenosis. CAD-RADS 1. Minimal non-obstructive CAD (0-24%). Consider non-atherosclerotic causes of chest pain. Consider preventive therapy and risk factor modification.  4.  No suspicious nodules, masses, or infiltrates are identified in the visualized portion of the lungs.  Mild scarring noted in both lung bases.  No pleural fluid seen 5.  Prior gastric bypass surgery is noted, with a small hiatal hernia.  14-day ZIO monitor on April 03, 2021: Predominant rhythm is  sinus with heart rate ranging from 47 bpm up to 140 bpm and average heart rate 77 bpm. There were rare PACs including couplets and triplets representing less than 1% total beats. There were rare PVCs including couplets representing less than 1% total beats.   Multiple, brief episodes of SVT were noted.  An episode of sustained atrial fibrillation with RVR was also noted on  December 24 with heart rate in the 150s on average. Atrial fibrillation represented less than 1% total rhythm burden however.   There were no pauses.  2D echocardiogram on March 13, 2021:  1. Left ventricular ejection fraction, by estimation, is 65 to 70%. The  left ventricle has normal function. The left ventricle has no regional  wall motion abnormalities. The left ventricular internal cavity size was  mildly dilated. Left ventricular  diastolic parameters were normal.   2. Right ventricular systolic function is normal. The right ventricular  size is normal. There is normal pulmonary artery systolic pressure.   3. The mitral valve is normal in structure. Trivial mitral valve  regurgitation.   4. The aortic valve is tricuspid. Aortic valve regurgitation is not  visualized.  Risk Assessment/Calculations:      The 10-year ASCVD risk score (Arnett DK, et al., 2019) is: 4.4%   Values used to calculate the score:     Age: 59 years     Sex: Male     Is Non-Hispanic African American: No     Diabetic: No     Tobacco smoker: No     Systolic Blood Pressure: 98 mmHg     Is BP treated: Yes     HDL Cholesterol: 65 mg/dL     Total Cholesterol: 166 mg/dL  Physical Exam:    VS:  BP 98/62 (BP Location: Right Arm, Patient Position: Sitting, Cuff Size: Normal)   Pulse 64   Ht 5\' 11"  (1.803 m)   Wt 226 lb (102.5 kg)   SpO2 96%   BMI 31.52 kg/m     Wt Readings from Last 3 Encounters:  06/10/23 226 lb (102.5 kg)  05/06/23 217 lb 6.4 oz (98.6 kg)  05/04/23 220 lb 6.4 oz (100 kg)    GEN: Obese, 63 y.o. male in no acute distress HEENT: Normal NECK: No JVD; No carotid bruits CARDIAC: S1/S2, RRR, no murmurs, rubs, gallops; 2+ peripheral pulses throughout, strong and equal bilaterally RESPIRATORY:  Clear to auscultation without rales, wheezing or rhonchi  MUSCULOSKELETAL:  No edema; No deformity  SKIN: Warm and dry NEUROLOGIC:  Alert and oriented x 3 PSYCHIATRIC:  Normal, pleasant  affect   ASSESSMENT & PLAN:    In order of problems listed above:  1. Minimal, non-obstructive CAD Coronary CTA in February 2023 revealed coronary calcium score 19, 50th percentile for his demographic.  Nonobstructive CAD with calcified plaque noted in proximal LAD causing minimal stenosis. Stable with no anginal symptoms. No indication for ischemic evaluation. PCP to manage lipids. Last lipid panel in 03/2023 was overall unremarkable. Will reduce Toprol XL. Continue rest of medication regimen. Heart healthy diet and regular cardiovascular exercise encouraged.    3. PAF Denies any tachycardia or palpitations.  CHA2DS2-VASc score 0.  HR well controlled. Will reduce Toprol-XL to 12.5 mg daily.  Because CHA2DS2-VASc score is 0, he does not require AC therapy. Heart healthy diet and regular cardiovascular exercise as tolerated encouraged.   4. Hx of syncope Denies any recurrent syncope, presyncope or any dizziness. Etiology for past syncopeal event unclear. Continue to follow  with PCP. Heart healthy diet and regular cardiovascular exercise encouraged.   5. Fatigue, hypotension Etiology multifactorial. Will reduce Toprol XL to 12.5 mg daily. Will arrange Echo for further evaluation. Discussed to monitor BP at home at least 2 hours after medications and sitting for 5-10 minutes. Discussed to notify office if SBP is consistently less than 100.  He verbalized understanding.  Asymptomatic regarding any significant signs or symptoms. Continue to follow with PCP.   5. Disposition: Care and ED precautions discussed.  Follow-up with Dr. Diona Browner or APP in 6 months or sooner if anything changes.    Medication Adjustments/Labs and Tests Ordered: Current medicines are reviewed at length with the patient today.  Concerns regarding medicines are outlined above.  Orders Placed This Encounter  Procedures   ECHOCARDIOGRAM COMPLETE   Meds ordered this encounter  Medications   metoprolol succinate (TOPROL-XL)  25 MG 24 hr tablet    Sig: Take 0.5 tablets (12.5 mg total) by mouth daily.    Dose decreased 06/10/2023    Patient Instructions  Medication Instructions:   Decrease Toprol to 12.5mg  daily  Continue all other medications.     Labwork:  none  Testing/Procedures:  Your physician has requested that you have an echocardiogram. Echocardiography is a painless test that uses sound waves to create images of your heart. It provides your doctor with information about the size and shape of your heart and how well your heart's chambers and valves are working. This procedure takes approximately one hour. There are no restrictions for this procedure. Please do NOT wear cologne, perfume, aftershave, or lotions (deodorant is allowed). Please arrive 15 minutes prior to your appointment time.  Please note: We ask at that you not bring children with you during ultrasound (echo/ vascular) testing. Due to room size and safety concerns, children are not allowed in the ultrasound rooms during exams. Our front office staff cannot provide observation of children in our lobby area while testing is being conducted. An adult accompanying a patient to their appointment will only be allowed in the ultrasound room at the discretion of the ultrasound technician under special circumstances. We apologize for any inconvenience. Office will contact with results via phone, letter or mychart.     Follow-Up:  6 months   Any Other Special Instructions Will Be Listed Below (If Applicable).   If you need a refill on your cardiac medications before your next appointment, please call your pharmacy.    Signed, Sharlene Dory, NP  06/10/2023 4:04 PM    [facility]

## 2023-06-10 NOTE — Patient Instructions (Signed)
 Medication Instructions:   Decrease Toprol to 12.5mg  daily  Continue all other medications.     Labwork:  none  Testing/Procedures:  Your physician has requested that you have an echocardiogram. Echocardiography is a painless test that uses sound waves to create images of your heart. It provides your doctor with information about the size and shape of your heart and how well your heart's chambers and valves are working. This procedure takes approximately one hour. There are no restrictions for this procedure. Please do NOT wear cologne, perfume, aftershave, or lotions (deodorant is allowed). Please arrive 15 minutes prior to your appointment time.  Please note: We ask at that you not bring children with you during ultrasound (echo/ vascular) testing. Due to room size and safety concerns, children are not allowed in the ultrasound rooms during exams. Our front office staff cannot provide observation of children in our lobby area while testing is being conducted. An adult accompanying a patient to their appointment will only be allowed in the ultrasound room at the discretion of the ultrasound technician under special circumstances. We apologize for any inconvenience. Office will contact with results via phone, letter or mychart.     Follow-Up:  6 months   Any Other Special Instructions Will Be Listed Below (If Applicable).   If you need a refill on your cardiac medications before your next appointment, please call your pharmacy.

## 2023-06-11 ENCOUNTER — Encounter: Payer: Self-pay | Admitting: Podiatry

## 2023-06-11 ENCOUNTER — Ambulatory Visit: Admitting: Podiatry

## 2023-06-11 ENCOUNTER — Ambulatory Visit (INDEPENDENT_AMBULATORY_CARE_PROVIDER_SITE_OTHER)

## 2023-06-11 DIAGNOSIS — M205X1 Other deformities of toe(s) (acquired), right foot: Secondary | ICD-10-CM

## 2023-06-11 DIAGNOSIS — B353 Tinea pedis: Secondary | ICD-10-CM

## 2023-06-11 DIAGNOSIS — M79671 Pain in right foot: Secondary | ICD-10-CM

## 2023-06-11 MED ORDER — GENTAMICIN SULFATE 0.1 % EX OINT: 1.0000 | TOPICAL_OINTMENT | Freq: Three times a day (TID) | CUTANEOUS | 0 refills | Status: DC

## 2023-06-11 MED ORDER — MUPIROCIN 2 % EX OINT: 1.0000 | TOPICAL_OINTMENT | Freq: Two times a day (BID) | CUTANEOUS | 2 refills | Status: DC

## 2023-06-11 NOTE — Progress Notes (Signed)
 Subjective:   Patient ID: William Farley, male   DOB: 63 y.o.   MRN: [id number]   HPI Chief Complaint  Patient presents with   Foot Pain    RM#13 Follow up on right foot infection in between pinky toe patient states not better and is having issue with great toe nail as well.    63 year old male presents outside above concerns.  States the redness is better but alcohol in between the toes up to about the same which is been ongoing for many years.  He states he has not been consistent with soaking his foot but he has been good about keeping cream on.  He does not report any drainage or pus or any red streaks.  No fevers or chills.  He has no other concerns today.   Review of Systems  All other systems reviewed and are negative.      Objective:  Physical Exam  General: AAO x3, NAD  Dermatological: On the right fourth interspace there is macerated tissue along the interspace.  There is no drainage or pus.  There is no cellulitis identified.  There is no abscess or fluid collection.  There is no fluctuation or crepitation.  No malodor.   Vascular: Dorsalis Pedis artery and Posterior Tibial artery pedal pulses are 2/4 bilateral with immedate capillary fill time.  There is no pain with calf compression, swelling, warmth, erythema.   Neruologic: Grossly intact via light touch bilateral.   Musculoskeletal: He does get tenderness on the interspace. Adducotvarus toes.  No other areas of discomfort.  No pinpoint tenderness.     Assessment:   Likely fungal infection with resolved superimposed cellulitis right foot     Plan:  -Treatment options discussed including all alternatives, risks, and complications -Etiology of symptoms were discussed -X-rays were obtained reviewed.  3 views were obtained.  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  No evidence of acute fracture.  No cortical changes to suggest osteomyelitis -I reviewed the culture results with the patient.  I still would  like to use the ciclopirox gel but also prescribe gentamicin as well as mupirocin to apply interdigitally.  Discussed changing shoes and socks regularly to avoid pressure as well as decrease moisture.  Discussed drying thoroughly between his toes.  Recommended soaking in warm water with diluted Betadine. Monitor for any clinical signs or symptoms of infection and directed to call the office immediately should any occur or go to the ER. -Discussed surgical intervention in the future if symptoms persist.  Return in about 3 weeks (around 07/02/2023).  Vivi Barrack DPM

## 2023-06-14 ENCOUNTER — Encounter: Payer: Federal, State, Local not specified - PPO | Attending: Family Medicine | Admitting: Nutrition

## 2023-06-14 VITALS — Ht 71.0 in | Wt 229.0 lb

## 2023-06-14 DIAGNOSIS — K912 Postsurgical malabsorption, not elsewhere classified: Secondary | ICD-10-CM | POA: Insufficient documentation

## 2023-06-14 NOTE — Progress Notes (Unsigned)
 Medical Nutrition Therapy  Appointment Start time:  1415  Appointment End time:  1515  Primary concerns today: Hypoglycemia  Referral diagnosis: E15.2 Preferred learning style: No preference  Learning readiness: Ready   NUTRITION ASSESSMENT   CHanges: eating more protein, vegetables and has been intentional with eating at meals. Eating more higher fiber foods. More active.   63 yr old wmale referred for hypoglycemia. Here with his wife. He reports having some blood sugars in the 40-50's randomly. PMH Gastric bypass, PTSD, HTN, Depression, Anxiety, Vit D deficiency Had Back surgery and lost appetite. Has chronic pain syndrome His highest weight was 310 lbs and had gastric bypass. He lost down to 158 lbs he reports, but has gained back to 217 lbs. He notes he didn't intend to drop so much weight after his gastric bypass but lost his appetite. [employer] isn't covered due to no diagnosis of DM.  He notes he can be a picky eater. Doesn't like a lot of foods. Not very active due to back issues. Does walk some.  He is willing to work on eating better balanced meals to include small snacks to meet his nutritional needs.  Clinical Medical Hx:  Past Medical History:  Diagnosis Date   Anxiety    Arthritis    CAD (coronary artery disease)    Minimal Non-obstructive CAD seen on coronary CTA 04/2021   Depression    GERD (gastroesophageal reflux disease)    History of kidney stones    History of thrombocytopenia    Hx of syncope    Hypoglycemia    PAF (paroxysmal atrial fibrillation) (HCC)    ChadsVasc Score is 0.  Monitor revealed less than 1% PAF burden.   Seizure (HCC) 02/2021   Sleep apnea    wears CPAP     Medications:  Current Outpatient Medications on File Prior to Visit  Medication Sig Dispense Refill   allopurinol (ZYLOPRIM) 300 MG tablet Take 1 tablet (300 mg total) by mouth daily. 90 tablet 3   Ciclopirox 0.77 % gel Apply 1 Application topically 2 (two) times daily. 45 g  1   Cyanocobalamin (VITAMIN B12 PO) Take 1 tablet by mouth every other day.     DULoxetine (CYMBALTA) 60 MG capsule Take 60 mg by mouth 2 (two) times daily.     fluconazole (DIFLUCAN) 150 MG tablet Take 1 tablet (150 mg total) by mouth once a week. 2 tablet 0   gentamicin ointment (GARAMYCIN) 0.1 % Apply 1 Application topically 3 (three) times daily. 15 g 0   LORazepam (ATIVAN) 1 MG tablet Take 1 mg by mouth 3 (three) times daily as needed for anxiety.     metoprolol succinate (TOPROL-XL) 25 MG 24 hr tablet Take 0.5 tablets (12.5 mg total) by mouth daily.     mupirocin ointment (BACTROBAN) 2 % Apply 1 Application topically 2 (two) times daily. 30 g 2   omeprazole (PRILOSEC) 20 MG capsule Take 20 mg by mouth in the morning.     tamsulosin (FLOMAX) 0.4 MG CAPS capsule Take 1 capsule (0.4 mg total) by mouth daily after supper. 30 capsule 11   tiZANidine (ZANAFLEX) 4 MG tablet Take 1 tablet (4 mg total) by mouth every 6 (six) hours as needed for muscle spasms. 60 tablet 0   Vitamin D, Ergocalciferol, (DRISDOL) 1.25 MG (50000 UNIT) CAPS capsule Take 1 capsule (50,000 Units total) by mouth every 7 (seven) days. 20 capsule 1   zolpidem (AMBIEN) 10 MG tablet Take 10 mg by mouth  at bedtime.     No current facility-administered medications on file prior to visit.    Labs:  Lab Results  Component Value Date   HGBA1C 5.3 03/26/2023      Latest Ref Rng & Units 05/07/2023   10:08 AM 05/04/2023   11:00 AM 04/09/2023   10:10 AM  CMP  Glucose 70 - 99 mg/dL  960  93   BUN 6 - 20 mg/dL  9  10   Creatinine 4.54 - 1.24 mg/dL 0.98  1.19  1.47   Sodium 135 - 145 mmol/L  138  142   Potassium 3.5 - 5.1 mmol/L  4.1  4.4   Chloride 98 - 111 mmol/L  107  102   CO2 22 - 32 mmol/L  27  23   Calcium 8.9 - 10.3 mg/dL  8.4  9.5   Total Protein 6.0 - 8.5 g/dL   7.0   Total Bilirubin 0.0 - 1.2 mg/dL   0.6   Alkaline Phos 44 - 121 IU/L   107   AST 0 - 40 IU/L   21   ALT 0 - 44 IU/L   14    Lipid Panel      Component Value Date/Time   CHOL 166 03/26/2023 0926   TRIG 61 03/26/2023 0926   HDL 65 03/26/2023 0926   CHOLHDL 2.6 03/26/2023 0926   LDLCALC 89 03/26/2023 0926   LABVLDL 12 03/26/2023 0926    Notable Signs/Symptoms: Low blood sugar symptoms a few times per week  Lifestyle & Dietary Hx LIves with his wife.  Estimated daily fluid intake: 80 oz Supplements:  Sleep:  Stress / self-care:  Current average weekly physical activity: Walks some  24-Hr Dietary Recall Eats 2-3 meals per day  Estimated Energy Needs Calories: 2000-2200 Carbohydrate: 225g Protein: 150g Fat: 56g   NUTRITION DIAGNOSIS  NI-5.8.4 Inconsistent carbohydrate intake As related to Hypoglycemia episodes.  As evidenced by blood sugars being in the 40-50's at times..   NUTRITION INTERVENTION  Nutrition education (E-1) on the following topics:  Nutrition and  hypoglycemia  discussed with My Plate, CHO counting, meal planning, portion sizes, timing of meals, avoiding snacks between meals unless having a low blood sugar, target ranges for A1C and blood sugars, signs/symptoms and treatment of hyper/hypoglycemia, monitoring blood sugars, taking medications as prescribed, benefits of exercising 30 minutes per day and prevention of complications of DM. Lifestyle Medicine  - Whole Food, Plant Predominant Nutrition is highly recommended: Eat Plenty of vegetables, Mushrooms, fruits, Legumes, Whole Grains, Nuts, seeds in lieu of processed meats, processed snacks/pastries red meat, poultry, eggs.    -It is better to avoid simple carbohydrates including: Cakes, Sweet Desserts, Ice Cream, Soda (diet and regular), Sweet Tea, Candies, Chips, Cookies, Store Bought Juices, Alcohol in Excess of  1-2 drinks a day, Lemonade,  Artificial Sweeteners, Doughnuts, Coffee Creamers, "Sugar-free" Products, etc, etc.  This is not a complete list.....  Exercise: If you are able: 30 -60 minutes a day ,4 days a week, or 150 minutes a week.   The longer the better.  Combine stretch, strength, and aerobic activities.  If you were told in the past that you have high risk for cardiovascular diseases, you may seek evaluation by your heart doctor prior to initiating moderate to intense exercise programs.   Handouts Provided Include  LIfestyle Medicine Signs and treatment of hypoglycema  Learning Style & Readiness for Change Teaching method utilized: Visual & Auditory  Demonstrated degree of understanding via: Teach  Back  Barriers to learning/adherence to lifestyle change: none  Goals Established by Pt Eat three balanced meals and 2 snack snacks per day Eat protein with eaach meal and snack  Avoid simple sugars, or processed foods Don't skip meals. Eat 45-60 g CHO at each meals and 15-30 grams of protein with meals at least. Keep glucose tablets, hard candy or icing with you incase of blood sugar dropping. Prevent hypoglycema Eat high fiber foods.   MONITORING & EVALUATION Dietary intake, weekly physical activity, and blood sugars in 1 month.  Next Steps  Patient is to work on eating consistently balanced meals and healthy snacks as needed.Marland Kitchen

## 2023-06-17 ENCOUNTER — Encounter: Payer: Self-pay | Admitting: Nutrition

## 2023-06-17 NOTE — Patient Instructions (Addendum)
 Eat three balanced meals and 2 snack snacks per day Eat protein with eaach meal and snack  Avoid simple sugars, or processed foods Don't skip meals. Eat 45-60 g CHO at each meals and 15-30 grams of protein with meals at least. Keep glucose tablets, hard candy or icing with you incase of blood sugar dropping. Prevent hypoglycema Eat high fiber foods.

## 2023-06-29 ENCOUNTER — Ambulatory Visit: Attending: Nurse Practitioner

## 2023-06-29 DIAGNOSIS — I959 Hypotension, unspecified: Secondary | ICD-10-CM | POA: Diagnosis not present

## 2023-06-29 DIAGNOSIS — R5383 Other fatigue: Secondary | ICD-10-CM | POA: Diagnosis not present

## 2023-06-29 LAB — ECHOCARDIOGRAM COMPLETE
AR max vel: 2.31 cm2
AV Area VTI: 2.26 cm2
AV Area mean vel: 2 cm2
AV Mean grad: 7 mmHg
AV Peak grad: 11.7 mmHg
Ao pk vel: 1.71 m/s
Area-P 1/2: 4.86 cm2
Calc EF: 60.3 %
MV VTI: 2.39 cm2
S' Lateral: 3.3 cm
Single Plane A2C EF: 64 %
Single Plane A4C EF: 56.6 %

## 2023-06-30 ENCOUNTER — Ambulatory Visit: Admitting: Urology

## 2023-06-30 ENCOUNTER — Encounter: Payer: Self-pay | Admitting: Urology

## 2023-06-30 VITALS — BP 102/65 | HR 71

## 2023-06-30 DIAGNOSIS — R972 Elevated prostate specific antigen [PSA]: Secondary | ICD-10-CM | POA: Diagnosis not present

## 2023-06-30 NOTE — Progress Notes (Signed)
 06/30/2023 3:08 PM   William Farley Aug 08, 1960 [id number]  Referring provider: Zarwolo, Gloria, FNP [address] [place],  [place] [address]  Elevated PSA   HPI: Mr Towell is a 62yo here for evaluation of elevated PSA. He had a PSA this year which was 4.29. His prior PSAs have been 0.3-0.4. IPSS 16 QOL 2 on flomax . Nocturia 1x. Urine stream fair. No straining to urinate.   PMH: Past Medical History:  Diagnosis Date   Anxiety    Arthritis    CAD (coronary artery disease)    Minimal Non-obstructive CAD seen on coronary CTA 04/2021   Depression    GERD (gastroesophageal reflux disease)    History of kidney stones    History of thrombocytopenia    Hx of syncope    Hypoglycemia    PAF (paroxysmal atrial fibrillation) (HCC)    ChadsVasc Score is 0.  Monitor revealed less than 1% PAF burden.   Seizure (HCC) 02/2021   Sleep apnea    wears CPAP     Surgical History: Past Surgical History:  Procedure Laterality Date   APPENDECTOMY     BIOPSY  05/01/2021   Procedure: BIOPSY;  Surgeon: Ruby Corporal, MD;  [facility]: [facility];  Service: Endoscopy;;   CERVICAL SPINE SURGERY  2018   CHOLECYSTECTOMY     COLONOSCOPY WITH PROPOFOL  N/A 05/01/2021   Procedure: COLONOSCOPY WITH PROPOFOL ;  Surgeon: Ruby Corporal, MD;  Location: [facility];  Service: Endoscopy;  Laterality: N/A;  1050   CYST EXCISION     CYSTOSCOPY     CYSTOSCOPY WITH RETROGRADE PYELOGRAM, URETEROSCOPY AND STENT PLACEMENT Right 08/08/2020   Procedure: CYSTOSCOPY WITH RIGHT RETROGRADE PYELOGRAM AND RIGHT URETEROSCOPY;  Surgeon: Marco Severs, MD;  Location: [facility];  Service: Urology;  Laterality: Right;   EXTRACORPOREAL SHOCK WAVE LITHOTRIPSY Right 05/21/2020   Procedure: EXTRACORPOREAL SHOCK WAVE LITHOTRIPSY (ESWL);  Surgeon: Marco Severs, MD;  Location: [facility];  Service: Urology;  Laterality: Right;   GASTRIC BYPASS OPEN  2003   INCISIONAL HERNIA REPAIR     kidney stone removal     KNEE  ARTHROSCOPY Left    multiple   NECK SURGERY     POLYPECTOMY  05/01/2021   Procedure: POLYPECTOMY;  Surgeon: Ruby Corporal, MD;  [facility]: [facility];  Service: Endoscopy;;   POSTERIOR FUSION PEDICLE SCREW PLACEMENT N/A 05/06/2023   Procedure: POSTERIOR LUMBAR INTERBODY FUSION LUMBAR FOUR-FIVE POSTERIOR LATERAL AND INTERBODY FUSION;  Surgeon: Audie Bleacher, MD;  Location: [facility];  Service: Neurosurgery;  Laterality: N/A;   REPLACEMENT TOTAL KNEE Right    RHINOPLASTY     STONE EXTRACTION WITH BASKET Right 08/08/2020   Procedure: STONE EXTRACTION WITH BASKET;  Surgeon: Marco Severs, MD;  Location: [facility];  Service: Urology;  Laterality: Right;   TOTAL KNEE ARTHROPLASTY Left 03/10/2022   Procedure: TOTAL KNEE ARTHROPLASTY;  Surgeon: Darrin Emerald, MD;  Location: [facility];  Service: Orthopedics;  Laterality: Left;    Home Medications:  Allergies as of 06/30/2023       Reactions   Codeine Itching, Rash, Other (See Comments)        Medication List        Accurate as of June 30, 2023  3:08 PM. If you have any questions, ask your nurse or doctor.          allopurinol  300 MG tablet Commonly known as: ZYLOPRIM  Take 1 tablet (300 mg total) by mouth daily.  Ciclopirox  0.77 % gel Apply 1 Application topically 2 (two) times daily.   DULoxetine  60 MG capsule Commonly known as: CYMBALTA  Take 60 mg by mouth 2 (two) times daily.   fluconazole  150 MG tablet Commonly known as: DIFLUCAN  Take 1 tablet (150 mg total) by mouth once a week.   gentamicin  ointment 0.1 % Commonly known as: GARAMYCIN  Apply 1 Application topically 3 (three) times daily.   LORazepam  1 MG tablet Commonly known as: ATIVAN  Take 1 mg by mouth 3 (three) times daily as needed for anxiety.   metoprolol  succinate 25 MG 24 hr tablet Commonly known as: TOPROL -XL Take 0.5 tablets (12.5 mg total) by mouth daily.   mupirocin  ointment 2 % Commonly known as: BACTROBAN  Apply 1 Application topically 2 (two)  times daily.   omeprazole 20 MG capsule Commonly known as: PRILOSEC Take 20 mg by mouth in the morning.   tamsulosin  0.4 MG Caps capsule Commonly known as: FLOMAX  Take 1 capsule (0.4 mg total) by mouth daily after supper.   tiZANidine  4 MG tablet Commonly known as: ZANAFLEX  Take 1 tablet (4 mg total) by mouth every 6 (six) hours as needed for muscle spasms.   VITAMIN B12 PO Take 1 tablet by mouth every other day.   Vitamin D  (Ergocalciferol ) 1.25 MG (50000 UNIT) Caps capsule Commonly known as: DRISDOL  Take 1 capsule (50,000 Units total) by mouth every 7 (seven) days.   zolpidem  10 MG tablet Commonly known as: AMBIEN  Take 10 mg by mouth at bedtime.        Allergies:  Allergies  Allergen Reactions   Codeine Itching, Rash and Other (See Comments)    Family History: Family History  Problem Relation Age of Onset   Heart disease Father 47   Kidney failure Father    Colon cancer Neg Hx     Social History:  reports that he quit smoking about 27 years ago. His smoking use included cigarettes. He started smoking about 37 years ago. He has a 5 pack-year smoking history. He has been exposed to tobacco smoke. He has never used smokeless tobacco. He reports that he does not currently use alcohol. He reports current drug use. Drug: Marijuana.  ROS: All other review of systems were reviewed and are negative except what is noted above in HPI  Physical Exam: BP 102/65   Pulse 71   Constitutional:  Alert and oriented, No acute distress. HEENT: [place] AT, moist mucus membranes.  Trachea midline, no masses. Cardiovascular: No clubbing, cyanosis, or edema. Respiratory: Normal respiratory effort, no increased work of breathing. GI: Abdomen is soft, nontender, nondistended, no abdominal masses GU: No CVA tenderness.  Lymph: No cervical or inguinal lymphadenopathy. Skin: No rashes, bruises or suspicious lesions. Neurologic: Grossly intact, no focal deficits, moving all 4  extremities. Psychiatric: Normal mood and affect.  Laboratory Data: Lab Results  Component Value Date   WBC 9.9 05/07/2023   HGB 10.5 (L) 05/07/2023   HCT 31.1 (L) 05/07/2023   MCV 90.9 05/07/2023   PLT 169 05/07/2023    Lab Results  Component Value Date   CREATININE 0.92 05/07/2023    No results found for: "PSA"  No results found for: "TESTOSTERONE"  Lab Results  Component Value Date   HGBA1C 5.3 03/26/2023    Urinalysis    Component Value Date/Time   APPEARANCEUR Clear 04/14/2023 1021   GLUCOSEU Negative 04/14/2023 1021   BILIRUBINUR Negative 04/14/2023 1021   PROTEINUR Negative 04/14/2023 1021   NITRITE Negative 04/14/2023 1021  LEUKOCYTESUR Negative 04/14/2023 1021    Lab Results  Component Value Date   LABMICR Comment 04/14/2023   WBCUA None seen 06/26/2020   LABEPIT 0-10 06/26/2020   BACTERIA None seen 06/26/2020    Pertinent Imaging:  Results for orders placed in visit on 07/08/22  DG Abd 1 View  Narrative CLINICAL DATA:  Kidney stones, lithotripsy RIGHT 6 months ago, has passed 5 stones since  EXAM: ABDOMEN - 1 VIEW  COMPARISON:  06/10/2021  FINDINGS: Tiny calculus inferior pole LEFT kidney.  Question 7 mm calculus upper pole LEFT kidney.  No additional urinary tract calcifications.  Bowel gas pattern normal.  Osseous structures unremarkable.  IMPRESSION: Potentially 2 LEFT renal calculi.   Electronically Signed By: Wynelle Heather M.D. On: 07/11/2022 11:44  No results found for this or any previous visit.  No results found for this or any previous visit.  No results found for this or any previous visit.  Results for orders placed during the hospital encounter of 12/15/22  Ultrasound renal complete  Narrative CLINICAL DATA:  Nephrolithiasis  EXAM: RENAL / URINARY TRACT ULTRASOUND COMPLETE  COMPARISON:  10/11/2020.  FINDINGS: Right Kidney:  Length: 11.1 cm. Echogenicity within normal limits. No mass  or hydronephrosis visualized. Shadowing lower pole stone identified that measures at least 6 mm.  Left Kidney:  Length: 13.0 cm. Echogenicity within normal limits. No mass or hydronephrosis visualized. No shadowing stones are demonstrated.  Bladder:  Appears normal for degree of bladder distention.  Splenomegaly incidentally noted, 13.9 cm.  IMPRESSION: 1. Right-sided nephrolithiasis. 2. No hydronephrosis. 3. Splenomegaly incidentally noted.   Electronically Signed By: Sydell Eva M.D. On: 01/02/2023 17:52  No results found for this or any previous visit.  No results found for this or any previous visit.  Results for orders placed in visit on 11/19/20  CT RENAL STONE STUDY  Narrative CLINICAL DATA:  Two weeks of intermittent sharp severe nonradiating right flank pain.  EXAM: CT ABDOMEN AND PELVIS WITHOUT CONTRAST  TECHNIQUE: Multidetector CT imaging of the abdomen and pelvis was performed following the standard protocol without IV contrast.  COMPARISON:  CT February 29, 2020  FINDINGS: Lower chest: No acute abnormality.  Hepatobiliary: Unremarkable noncontrast appearance of the hepatic parenchyma. Gallbladder surgically absent. No biliary ductal dilation.  Pancreas: Within normal limits.  Spleen: Within normal limits.  Adrenals/Urinary Tract: Bilateral adrenal glands are unremarkable. No hydronephrosis. Left kidney is unremarkable. Punctate nonobstructive right lower pole renal stone. No obstructive ureteral or bladder calculus visualized. Urinary bladder is decompressed limiting evaluation.  Stomach/Bowel: Prior gastric bypass. No pathologic dilation of small or large bowel. Appendix is surgically absent.  Vascular/Lymphatic: Aortic atherosclerosis without abdominal aortic aneurysm. No pathologically enlarged abdominal or pelvic lymph nodes.  Reproductive: Prostate is unremarkable.  Other: Fat in bilateral inguinal canals.  No  abdominopelvic ascites.  Musculoskeletal: Mild thoracolumbar spondylosis. No acute osseous abnormality.  IMPRESSION: 1. No acute findings in the abdomen or pelvis. 2. Punctate nonobstructive right lower pole renal stone. No obstructive ureteral or bladder calculus visualized. 3. Prior gastric bypass and cholecystectomy. 4.  Aortic Atherosclerosis (ICD10-I70.0).   Electronically Signed By: Tama Fails M.D. On: 11/20/2020 11:11   Assessment & Plan:    1. Elevated PSA (Primary) IsoPSA, will call with results. We will proceed with prostate biopsy based on IsoPSA results - Urinalysis, Routine w reflex microscopic   No follow-ups on file.  Johnie Nailer, MD  [facility] [place]

## 2023-06-30 NOTE — Patient Instructions (Signed)

## 2023-07-01 ENCOUNTER — Other Ambulatory Visit (HOSPITAL_COMMUNITY): Payer: Self-pay | Admitting: Nurse Practitioner

## 2023-07-01 DIAGNOSIS — E162 Hypoglycemia, unspecified: Secondary | ICD-10-CM

## 2023-07-01 LAB — URINALYSIS, ROUTINE W REFLEX MICROSCOPIC
Bilirubin, UA: NEGATIVE
Glucose, UA: NEGATIVE
Ketones, UA: NEGATIVE
Leukocytes,UA: NEGATIVE
Nitrite, UA: NEGATIVE
Protein,UA: NEGATIVE
RBC, UA: NEGATIVE
Specific Gravity, UA: 1.025 (ref 1.005–1.030)
Urobilinogen, Ur: 0.2 mg/dL (ref 0.2–1.0)
pH, UA: 6 (ref 5.0–7.5)

## 2023-07-02 ENCOUNTER — Ambulatory Visit: Admitting: Podiatry

## 2023-07-02 ENCOUNTER — Encounter: Payer: Self-pay | Admitting: Podiatry

## 2023-07-02 DIAGNOSIS — B353 Tinea pedis: Secondary | ICD-10-CM

## 2023-07-02 DIAGNOSIS — M205X1 Other deformities of toe(s) (acquired), right foot: Secondary | ICD-10-CM

## 2023-07-04 NOTE — Progress Notes (Signed)
  Subjective:  Patient ID: William Farley, male    DOB: 07/26/1960,  MRN: [id number]  Chief Complaint  Patient presents with   Foot Pain    RM#13 Right foot follow up on infection in between toes patient states is doing much better treatment was effective.     Discussed the use of AI scribe software for clinical note transcription with the patient, who gave verbal consent to proceed.  History of Present Illness The patient, with a history of foot pain, reports significant improvement in his symptoms. He has been using prescribed creams twice daily, which has led to a reduction in tenderness and pain, particularly the pain that would wake him up at night. The patient notes that his foot is almost back to normal and is the best it has felt in years. He has been diligent in drying his foot well, especially between the toes after showering, to prevent moisture build-up. The patient has also been changing his shoes and socks regularly. He has enough medication for now and will call if he needs a refill.      Objective:    Physical Exam General: AAO x3, NAD  Dermatological: On the right fourth interspace where he is having discomfort there is no bleeding today.  There are some slight macerated tissue but overall significantly improved.  There is no drainage or pus.  There is no erythema or warmth.  No fluctuation or crepitation.  There is no malodor.  Vascular: Dorsalis Pedis artery and Posterior Tibial artery pedal pulses are 2/4 bilateral with immedate capillary fill time.  There is no pain with calf compression, swelling, warmth, erythema.   Neruologic: Grossly intact via light touch bilateral.   Musculoskeletal: No pain on exam today.   No images are attached to the encounter.    Results     Assessment:   1. Adductovarus rotation of toe, acquired, right   2. Tinea pedis of right foot      Plan:  Patient was evaluated and treated and all questions answered.  Assessment  and Plan Assessment & Plan Interdigital Tinea Pedis Significant improvement with current treatment. Symptoms improved but not fully resolved. Moisture retention between toes affects persistence. - Continue topical treatment twice daily until symptoms resolve. - Emphasized thorough drying between toes post-shower. - Consider lamb's wool or cotton between toes for moisture absorption. - Regularly change shoes and socks, prefer white socks. - Contact for medication refills if needed.   Return if symptoms worsen or fail to improve.   Charity Conch DPM

## 2023-07-05 ENCOUNTER — Encounter: Payer: Self-pay | Admitting: Family Medicine

## 2023-07-05 ENCOUNTER — Ambulatory Visit: Payer: BC Managed Care – PPO | Admitting: Family Medicine

## 2023-07-05 VITALS — BP 92/59 | HR 67 | Ht 71.0 in | Wt 226.0 lb

## 2023-07-05 DIAGNOSIS — R972 Elevated prostate specific antigen [PSA]: Secondary | ICD-10-CM | POA: Diagnosis not present

## 2023-07-05 DIAGNOSIS — I10 Essential (primary) hypertension: Secondary | ICD-10-CM | POA: Diagnosis not present

## 2023-07-05 DIAGNOSIS — E559 Vitamin D deficiency, unspecified: Secondary | ICD-10-CM

## 2023-07-05 DIAGNOSIS — E7849 Other hyperlipidemia: Secondary | ICD-10-CM

## 2023-07-05 DIAGNOSIS — E162 Hypoglycemia, unspecified: Secondary | ICD-10-CM | POA: Diagnosis not present

## 2023-07-05 DIAGNOSIS — R7301 Impaired fasting glucose: Secondary | ICD-10-CM

## 2023-07-05 DIAGNOSIS — E038 Other specified hypothyroidism: Secondary | ICD-10-CM

## 2023-07-05 NOTE — Assessment & Plan Note (Signed)
 The patient's blood pressure is slightly low today, and he reports taking metoprolol 12.5 mg daily. He also acknowledges a decrease in fluid intake, noting that he does not like to drink water. He is currently asymptomatic in the clinic. Advised the patient to hold his blood pressure medication if his blood pressure is less than 90/60 mmHg. Encouraged adequate hydration to help support blood pressure stability. The patient verbalized understanding and is aware of the plan of care.

## 2023-07-05 NOTE — Assessment & Plan Note (Addendum)
 The patient is currently under the care of urology. I reviewed the urologist's note, which states that they will proceed with a prostate biopsy based on the IsoPSA results. The patient was informed of the plan and advised to continue follow-up with urology as outlined in their care plan.

## 2023-07-05 NOTE — Patient Instructions (Addendum)
 I appreciate the opportunity to provide care to you today!    Follow up:  4 months  Labs: next visit     Please continue to a heart-healthy diet and increase your physical activities. Try to exercise for at least five days a week.    It was a pleasure to see you and I look forward to continuing to work together on your health and well-being. Please do not hesitate to call the office if you need care or have questions about your care.  In case of emergency, please visit the Emergency Department for urgent care, or contact our clinic at [phone] to schedule an appointment. We're here to help you!   Have a wonderful day and week. With Gratitude, Jaythen Hamme MSN, FNP-BC'

## 2023-07-05 NOTE — Progress Notes (Signed)
 Established Patient Office Visit  Subjective:  Patient ID: William Farley, male    DOB: Jan 11, 1961  Age: 63 y.o. MRN: [id number]  CC:  Chief Complaint  Patient presents with   Medical Management of Chronic Issues    3 month f/u concerns about PSA levels would like to request lab work  Glucose concerns of low levels     HPI William Farley is a 63 y.o. male with past medical history of essential hypertension, anxiety and depression presents for f/u of  chronic medical conditions. For the details of today's visit, please refer to the assessment and plan.     Past Medical History:  Diagnosis Date   Anxiety    Arthritis    CAD (coronary artery disease)    Minimal Non-obstructive CAD seen on coronary CTA 04/2021   Depression    GERD (gastroesophageal reflux disease)    History of kidney stones    History of thrombocytopenia    Hx of syncope    Hypoglycemia    PAF (paroxysmal atrial fibrillation) (HCC)    ChadsVasc Score is 0.  Monitor revealed less than 1% PAF burden.   Seizure (HCC) 02/2021   Sleep apnea    wears CPAP     Past Surgical History:  Procedure Laterality Date   APPENDECTOMY     BIOPSY  05/01/2021   Procedure: BIOPSY;  Surgeon: Malissa Hippo, MD;  [facility]: [facility];  Service: Endoscopy;;   CERVICAL SPINE SURGERY  2018   CHOLECYSTECTOMY     COLONOSCOPY WITH PROPOFOL N/A 05/01/2021   Procedure: COLONOSCOPY WITH PROPOFOL;  Surgeon: Malissa Hippo, MD;  Location: [facility];  Service: Endoscopy;  Laterality: N/A;  1050   CYST EXCISION     CYSTOSCOPY     CYSTOSCOPY WITH RETROGRADE PYELOGRAM, URETEROSCOPY AND STENT PLACEMENT Right 08/08/2020   Procedure: CYSTOSCOPY WITH RIGHT RETROGRADE PYELOGRAM AND RIGHT URETEROSCOPY;  Surgeon: Malen Gauze, MD;  Location: [facility];  Service: Urology;  Laterality: Right;   EXTRACORPOREAL SHOCK WAVE LITHOTRIPSY Right 05/21/2020   Procedure: EXTRACORPOREAL SHOCK WAVE LITHOTRIPSY (ESWL);  Surgeon: Malen Gauze,  MD;  Location: [facility];  Service: Urology;  Laterality: Right;   GASTRIC BYPASS OPEN  2003   INCISIONAL HERNIA REPAIR     kidney stone removal     KNEE ARTHROSCOPY Left    multiple   NECK SURGERY     POLYPECTOMY  05/01/2021   Procedure: POLYPECTOMY;  Surgeon: Malissa Hippo, MD;  [facility]: [facility];  Service: Endoscopy;;   POSTERIOR FUSION PEDICLE SCREW PLACEMENT N/A 05/06/2023   Procedure: POSTERIOR LUMBAR INTERBODY FUSION LUMBAR FOUR-FIVE POSTERIOR LATERAL AND INTERBODY FUSION;  Surgeon: Coletta Memos, MD;  Location: [facility];  Service: Neurosurgery;  Laterality: N/A;   REPLACEMENT TOTAL KNEE Right    RHINOPLASTY     STONE EXTRACTION WITH BASKET Right 08/08/2020   Procedure: STONE EXTRACTION WITH BASKET;  Surgeon: Malen Gauze, MD;  Location: [facility];  Service: Urology;  Laterality: Right;   TOTAL KNEE ARTHROPLASTY Left 03/10/2022   Procedure: TOTAL KNEE ARTHROPLASTY;  Surgeon: Vickki Hearing, MD;  Location: [facility];  Service: Orthopedics;  Laterality: Left;    Family History  Problem Relation Age of Onset   Heart disease Father 59   Kidney failure Father    Colon cancer Neg Hx     Social History   Socioeconomic History   Marital status: Married    Spouse name: Not on file  Number of children: Not on file   Years of education: Not on file   Highest education level: Associate degree: occupational, technical, or vocational program  Occupational History   Occupation: Economist  Tobacco Use   Smoking status: Former    Current packs/day: 0.00    Average packs/day: 0.5 packs/day for 10.0 years (5.0 ttl pk-yrs)    Types: Cigarettes    Start date: 05/20/1986    Quit date: 05/20/1996    Years since quitting: 27.1    Passive exposure: Past   Smokeless tobacco: Never  Vaping Use   Vaping status: Never Used  Substance and Sexual Activity   Alcohol use: Not Currently   Drug use: Yes    Types: Marijuana    Comment: daily   Sexual activity: Yes  Other  Topics Concern   Not on file  Social History Narrative   Right handed    Married wife   One story home   Social Drivers of Health   Financial Resource Strain: Low Risk  (07/04/2023)   Overall Financial Resource Strain (CARDIA)    Difficulty of Paying Living Expenses: Not very hard  Food Insecurity: No Food Insecurity (07/04/2023)   Hunger Vital Sign    Worried About Running Out of Food in the Last Year: Never true    Ran Out of Food in the Last Year: Never true  Transportation Needs: Unmet Transportation Needs (07/04/2023)   PRAPARE - Transportation    Lack of Transportation (Medical): Yes    Lack of Transportation (Non-Medical): Yes  Physical Activity: Inactive (07/04/2023)   Exercise Vital Sign    Days of Exercise per Week: 0 days    Minutes of Exercise per Session: 10 min  Stress: Stress Concern Present (07/04/2023)   [employer] of Occupational Health - Occupational Stress Questionnaire    Feeling of Stress : Very much  Social Connections: Moderately Integrated (07/04/2023)   Social Connection and Isolation Panel [NHANES]    Frequency of Communication with Friends and Family: Never    Frequency of Social Gatherings with Friends and Family: Never    Attends Religious Services: More than 4 times per year    Active Member of [employer] or Organizations: Yes    Attends Engineer, structural: More than 4 times per year    Marital Status: Married  Catering manager Violence: Not At Risk (03/10/2022)   Humiliation, Afraid, Rape, and Kick questionnaire    Fear of Current or Ex-Partner: No    Emotionally Abused: No    Physically Abused: No    Sexually Abused: No    Outpatient Medications Prior to Visit  Medication Sig Dispense Refill   allopurinol (ZYLOPRIM) 300 MG tablet Take 1 tablet (300 mg total) by mouth daily. 90 tablet 3   Ciclopirox 0.77 % gel Apply 1 Application topically 2 (two) times daily. 45 g 1   DULoxetine (CYMBALTA) 60 MG capsule Take 60 mg by mouth 2 (two)  times daily.     gentamicin ointment (GARAMYCIN) 0.1 % Apply 1 Application topically 3 (three) times daily. 15 g 0   LORazepam (ATIVAN) 1 MG tablet Take 1 mg by mouth 3 (three) times daily as needed for anxiety.     metoprolol succinate (TOPROL-XL) 25 MG 24 hr tablet Take 0.5 tablets (12.5 mg total) by mouth daily.     Multiple Vitamin (MULTIVITAMIN) tablet Take 1 tablet by mouth 3 (three) times daily.     mupirocin ointment (BACTROBAN) 2 % Apply 1 Application  topically 2 (two) times daily. 30 g 2   omeprazole (PRILOSEC) 20 MG capsule Take 20 mg by mouth in the morning.     tamsulosin (FLOMAX) 0.4 MG CAPS capsule Take 1 capsule (0.4 mg total) by mouth daily after supper. 30 capsule 11   Vitamin D, Ergocalciferol, (DRISDOL) 1.25 MG (50000 UNIT) CAPS capsule Take 1 capsule (50,000 Units total) by mouth every 7 (seven) days. 20 capsule 1   zolpidem (AMBIEN) 10 MG tablet Take 10 mg by mouth at bedtime.     tiZANidine (ZANAFLEX) 4 MG tablet Take 1 tablet (4 mg total) by mouth every 6 (six) hours as needed for muscle spasms. (Patient not taking: Reported on 07/05/2023) 60 tablet 0   Cyanocobalamin (VITAMIN B12 PO) Take 1 tablet by mouth every other day. (Patient not taking: Reported on 07/05/2023)     fluconazole (DIFLUCAN) 150 MG tablet Take 1 tablet (150 mg total) by mouth once a week. (Patient not taking: Reported on 07/05/2023) 2 tablet 0   No facility-administered medications prior to visit.    Allergies  Allergen Reactions   Codeine Itching, Rash and Other (See Comments)    ROS Review of Systems  Constitutional:  Negative for fatigue and fever.  Eyes:  Negative for visual disturbance.  Respiratory:  Negative for chest tightness and shortness of breath.   Cardiovascular:  Negative for chest pain and palpitations.  Neurological:  Negative for dizziness and headaches.      Objective:    Physical Exam HENT:     Head: Normocephalic.     Right Ear: External ear normal.     Left Ear:  External ear normal.     Nose: No congestion or rhinorrhea.     Mouth/Throat:     Mouth: Mucous membranes are moist.  Cardiovascular:     Rate and Rhythm: Regular rhythm.     Heart sounds: No murmur heard. Pulmonary:     Effort: No respiratory distress.     Breath sounds: Normal breath sounds.  Neurological:     Mental Status: He is alert.     BP (!) 92/59   Pulse 67   Ht 5\' 11"  (1.803 m)   Wt 226 lb 0.3 oz (102.5 kg)   SpO2 96%   BMI 31.52 kg/m  Wt Readings from Last 3 Encounters:  07/05/23 226 lb 0.3 oz (102.5 kg)  06/14/23 229 lb (103.9 kg)  06/10/23 226 lb (102.5 kg)    Lab Results  Component Value Date   TSH 2.400 03/26/2023   Lab Results  Component Value Date   WBC 9.9 05/07/2023   HGB 10.5 (L) 05/07/2023   HCT 31.1 (L) 05/07/2023   MCV 90.9 05/07/2023   PLT 169 05/07/2023   Lab Results  Component Value Date   NA 138 05/04/2023   K 4.1 05/04/2023   CO2 27 05/04/2023   GLUCOSE 123 (H) 05/04/2023   BUN 9 05/04/2023   CREATININE 0.92 05/07/2023   BILITOT 0.6 04/09/2023   ALKPHOS 107 04/09/2023   AST 21 04/09/2023   ALT 14 04/09/2023   PROT 7.0 04/09/2023   ALBUMIN 4.6 04/09/2023   CALCIUM 8.4 (L) 05/04/2023   ANIONGAP 4 (L) 05/04/2023   EGFR 87 04/09/2023   Lab Results  Component Value Date   CHOL 166 03/26/2023   Lab Results  Component Value Date   HDL 65 03/26/2023   Lab Results  Component Value Date   LDLCALC 89 03/26/2023   Lab Results  Component Value  Date   TRIG 61 03/26/2023   Lab Results  Component Value Date   CHOLHDL 2.6 03/26/2023   Lab Results  Component Value Date   HGBA1C 5.3 03/26/2023      Assessment & Plan:  Essential (primary) hypertension Assessment & Plan: The patient's blood pressure is slightly low today, and he reports taking metoprolol 12.5 mg daily. He also acknowledges a decrease in fluid intake, noting that he does not like to drink water. He is currently asymptomatic in the clinic. Advised the  patient to hold his blood pressure medication if his blood pressure is less than 90/60 mmHg. Encouraged adequate hydration to help support blood pressure stability. The patient verbalized understanding and is aware of the plan of care.    PSA elevation Assessment & Plan: The patient is currently under the care of urology. I reviewed the urologist's note, which states that they will proceed with a prostate biopsy based on the IsoPSA results. The patient was informed of the plan and advised to continue follow-up with urology as outlined in their care plan.     Hypoglycemia Assessment & Plan: Encouraged to continue care with endocrinology   IFG (impaired fasting glucose) -     Hemoglobin A1c  Vitamin D deficiency  TSH (thyroid-stimulating hormone deficiency)  Other hyperlipidemia   Note: This chart has been completed using Engineer, civil (consulting) software, and while attempts have been made to ensure accuracy, certain words and phrases may not be transcribed as intended.   Follow-up: Return in about 4 months (around 11/04/2023).   Amun Stemm, FNP

## 2023-07-05 NOTE — Assessment & Plan Note (Signed)
 Encouraged to continue care with endocrinology

## 2023-07-07 ENCOUNTER — Encounter: Payer: Self-pay | Admitting: Nutrition

## 2023-07-09 ENCOUNTER — Encounter: Payer: Self-pay | Admitting: Urology

## 2023-07-10 ENCOUNTER — Encounter (HOSPITAL_COMMUNITY): Payer: Self-pay | Admitting: Emergency Medicine

## 2023-07-10 ENCOUNTER — Emergency Department (HOSPITAL_COMMUNITY)
Admission: EM | Admit: 2023-07-10 | Discharge: 2023-07-11 | Disposition: A | Discharge status: Home / Self Care | Attending: Emergency Medicine | Admitting: Emergency Medicine

## 2023-07-10 ENCOUNTER — Other Ambulatory Visit: Payer: Self-pay

## 2023-07-10 DIAGNOSIS — R112 Nausea with vomiting, unspecified: Secondary | ICD-10-CM

## 2023-07-10 DIAGNOSIS — E860 Dehydration: Secondary | ICD-10-CM

## 2023-07-10 DIAGNOSIS — R197 Diarrhea, unspecified: Secondary | ICD-10-CM | POA: Diagnosis not present

## 2023-07-10 DIAGNOSIS — R000 Tachycardia, unspecified: Secondary | ICD-10-CM | POA: Diagnosis not present

## 2023-07-10 LAB — CBC WITH DIFFERENTIAL/PLATELET
Abs Immature Granulocytes: 0.06 10*3/uL (ref 0.00–0.07)
Basophils Absolute: 0.1 10*3/uL (ref 0.0–0.1)
Basophils Relative: 0 %
Eosinophils Absolute: 0.1 10*3/uL (ref 0.0–0.5)
Eosinophils Relative: 0 %
HCT: 44.7 % (ref 39.0–52.0)
Hemoglobin: 15.5 g/dL (ref 13.0–17.0)
Immature Granulocytes: 0 %
Lymphocytes Relative: 6 %
Lymphs Abs: 0.9 10*3/uL (ref 0.7–4.0)
MCH: 30.2 pg (ref 26.0–34.0)
MCHC: 34.7 g/dL (ref 30.0–36.0)
MCV: 87.1 fL (ref 80.0–100.0)
Monocytes Absolute: 1 10*3/uL (ref 0.1–1.0)
Monocytes Relative: 7 %
Neutro Abs: 12.1 10*3/uL — ABNORMAL HIGH (ref 1.7–7.7)
Neutrophils Relative %: 87 %
Platelets: 252 10*3/uL (ref 150–400)
RBC: 5.13 MIL/uL (ref 4.22–5.81)
RDW: 12.9 % (ref 11.5–15.5)
WBC: 14 10*3/uL — ABNORMAL HIGH (ref 4.0–10.5)
nRBC: 0 % (ref 0.0–0.2)

## 2023-07-10 LAB — COMPREHENSIVE METABOLIC PANEL WITH GFR
ALT: 18 U/L (ref 0–44)
AST: 31 U/L (ref 15–41)
Albumin: 4.7 g/dL (ref 3.5–5.0)
Alkaline Phosphatase: 98 U/L (ref 38–126)
Anion gap: 16 — ABNORMAL HIGH (ref 5–15)
BUN: 16 mg/dL (ref 6–20)
CO2: 17 mmol/L — ABNORMAL LOW (ref 22–32)
Calcium: 10.1 mg/dL (ref 8.9–10.3)
Chloride: 107 mmol/L (ref 98–111)
Creatinine, Ser: 1.44 mg/dL — ABNORMAL HIGH (ref 0.61–1.24)
GFR, Estimated: 56 mL/min — ABNORMAL LOW (ref >60)
Glucose, Bld: 171 mg/dL — ABNORMAL HIGH (ref 70–99)
Potassium: 3.7 mmol/L (ref 3.5–5.1)
Sodium: 140 mmol/L (ref 135–145)
Total Bilirubin: 1.3 mg/dL — ABNORMAL HIGH (ref 0.0–1.2)
Total Protein: 7.9 g/dL (ref 6.5–8.1)

## 2023-07-10 LAB — LIPASE, BLOOD: Lipase: 24 U/L (ref 11–51)

## 2023-07-10 MED ORDER — SCOPOLAMINE 1 MG/3DAYS TD PT72
1.0000 | MEDICATED_PATCH | TRANSDERMAL | Status: DC
  Administered 2023-07-10: 1.5 mg via TRANSDERMAL
  Filled 2023-07-10: qty 1

## 2023-07-10 MED ORDER — FAMOTIDINE IN NACL 20-0.9 MG/50ML-% IV SOLN
20.0000 mg | Freq: Once | INTRAVENOUS | Status: AC
  Administered 2023-07-10: 20 mg via INTRAVENOUS
  Filled 2023-07-10: qty 50

## 2023-07-10 MED ORDER — ONDANSETRON 4 MG PO TBDP: 4.0000 mg | ORAL_TABLET | Freq: Four times a day (QID) | ORAL | 0 refills | Status: DC | PRN

## 2023-07-10 MED ORDER — DROPERIDOL 2.5 MG/ML IJ SOLN: 1.2500 mg | Freq: Once | INTRAMUSCULAR | Status: DC

## 2023-07-10 MED ORDER — SODIUM CHLORIDE 0.9 % IV BOLUS
1000.0000 mL | Freq: Once | INTRAVENOUS | Status: AC
  Administered 2023-07-10: 1000 mL via INTRAVENOUS

## 2023-07-10 MED ORDER — ONDANSETRON HCL 4 MG/2ML IJ SOLN
4.0000 mg | Freq: Once | INTRAMUSCULAR | Status: AC
  Administered 2023-07-10: 4 mg via INTRAVENOUS
  Filled 2023-07-10: qty 2

## 2023-07-10 MED ORDER — LACTATED RINGERS IV BOLUS
1000.0000 mL | Freq: Once | INTRAVENOUS | Status: AC
  Administered 2023-07-10: 1000 mL via INTRAVENOUS

## 2023-07-10 NOTE — ED Triage Notes (Signed)
 Pt arrives to ED via POV c/o severe vomiting for the past 4 hours. Pt states he is dehydrated.

## 2023-07-10 NOTE — ED Provider Notes (Signed)
 [place] [facility]MENT AT [facility] Provider Note   CSN: [id number] Arrival date & time: 07/10/23  2121     History  Chief Complaint  Patient presents with   Emesis    William Farley is a 63 y.o. male.  He has history of PTSD, thrombocytopenia, chronic pain, IBS, hypoglycemia, elevated PSA.  Presents the ER today for nausea and vomiting that started several hours ago suddenly.  Denies abdominal pain or diarrhea outside of his usual baseline.  Denies urinary symptoms.  Denies any chest pain shortness of breath, denies dizziness or syncope.  He reports history of kidney stones but denies any flank pain, no blood in his urine, does not feel anything like prior kidney stones.   Emesis      Home Medications Prior to Admission medications   Medication Sig Start Date End Date Taking? Authorizing Provider  ondansetron  (ZOFRAN -ODT) 4 MG disintegrating tablet Take 1 tablet (4 mg total) by mouth every 6 (six) hours as needed for nausea or vomiting. 07/10/23  Yes Colbie Sliker A, PA-C  allopurinol  (ZYLOPRIM ) 300 MG tablet Take 1 tablet (300 mg total) by mouth daily. 04/14/23   McKenzie, Arden Beck, MD  Ciclopirox  0.77 % gel Apply 1 Application topically 2 (two) times daily. 05/24/23   Charity Conch, DPM  DULoxetine  (CYMBALTA ) 60 MG capsule Take 60 mg by mouth 2 (two) times daily.    [provider]  gentamicin  ointment (GARAMYCIN ) 0.1 % Apply 1 Application topically 3 (three) times daily. 06/11/23   Charity Conch, DPM  LORazepam  (ATIVAN ) 1 MG tablet Take 1 mg by mouth 3 (three) times daily as needed for anxiety. 04/17/21   [provider]  metoprolol  succinate (TOPROL -XL) 25 MG 24 hr tablet Take 0.5 tablets (12.5 mg total) by mouth daily. 06/10/23   Lasalle Pointer, NP  Multiple Vitamin (MULTIVITAMIN) tablet Take 1 tablet by mouth 3 (three) times daily.    [provider]  mupirocin  ointment (BACTROBAN ) 2 % Apply 1 Application topically 2  (two) times daily. 06/11/23   Charity Conch, DPM  omeprazole (PRILOSEC) 20 MG capsule Take 20 mg by mouth in the morning. 04/11/20   [provider]  tamsulosin  (FLOMAX ) 0.4 MG CAPS capsule Take 1 capsule (0.4 mg total) by mouth daily after supper. 09/14/22   McKenzie, Arden Beck, MD  tiZANidine  (ZANAFLEX ) 4 MG tablet Take 1 tablet (4 mg total) by mouth every 6 (six) hours as needed for muscle spasms. Patient not taking: Reported on 07/05/2023 05/07/23   Audie Bleacher, MD  Vitamin D , Ergocalciferol , (DRISDOL ) 1.25 MG (50000 UNIT) CAPS capsule Take 1 capsule (50,000 Units total) by mouth every 7 (seven) days. 04/03/23   Zarwolo, Gloria, FNP  zolpidem  (AMBIEN ) 10 MG tablet Take 10 mg by mouth at bedtime.    [provider]      Allergies    Codeine    Review of Systems   Review of Systems  Gastrointestinal:  Positive for vomiting.    Physical Exam Updated Vital Signs BP 117/78   Pulse 65   Temp 97.7 F (36.5 C) (Oral)   Resp 17   Ht 5\' 11"  (1.803 m)   Wt 102.5 kg   SpO2 99%   BMI 31.52 kg/m  Physical Exam Vitals and nursing note reviewed.  Constitutional:      General: He is not in acute distress.    Appearance: He is well-developed. He is not diaphoretic.     Comments: Patient  appears uncomfortable, actively vomiting  HENT:     Head: Normocephalic and atraumatic.  Eyes:     Conjunctiva/sclera: Conjunctivae normal.  Cardiovascular:     Rate and Rhythm: Regular rhythm. Tachycardia present.     Heart sounds: No murmur heard. Pulmonary:     Effort: Pulmonary effort is normal. No respiratory distress.     Breath sounds: Normal breath sounds.  Abdominal:     Palpations: Abdomen is soft.     Tenderness: There is no abdominal tenderness. There is no right CVA tenderness, left CVA tenderness, guarding or rebound.  Musculoskeletal:        General: No swelling.     Cervical back: Neck supple.  Skin:    General: Skin is warm and dry.     Capillary Refill:  Capillary refill takes less than 2 seconds.  Neurological:     Mental Status: He is alert.  Psychiatric:        Mood and Affect: Mood normal.     ED Results / Procedures / Treatments   Labs (all labs ordered are listed, but only abnormal results are displayed) Labs Reviewed  CBC WITH DIFFERENTIAL/PLATELET - Abnormal; Notable for the following components:      Result Value   WBC 14.0 (*)    Neutro Abs 12.1 (*)    All other components within normal limits  COMPREHENSIVE METABOLIC PANEL WITH GFR - Abnormal; Notable for the following components:   CO2 17 (*)    Glucose, Bld 171 (*)    Creatinine, Ser 1.44 (*)    Total Bilirubin 1.3 (*)    GFR, Estimated 56 (*)    Anion gap 16 (*)    All other components within normal limits  LIPASE, BLOOD    EKG EKG Interpretation Date/Time:  Saturday July 10 2023 22:49:47 EDT Ventricular Rate:  71 PR Interval:  196 QRS Duration:  101 QT Interval:  420 QTC Calculation: 457 R Axis:   83  Text Interpretation: Sinus rhythm Borderline right axis deviation Confirmed by Rafael Bun [phone]) on 07/11/2023 1:11:18 PM  Radiology No results found.  Procedures Procedures    Medications Ordered in ED Medications  ondansetron  (ZOFRAN ) injection 4 mg (4 mg Intravenous Given 07/10/23 2144)  sodium chloride  0.9 % bolus 1,000 mL (0 mLs Intravenous Stopped 07/10/23 2240)  famotidine  (PEPCID ) IVPB 20 mg premix (0 mg Intravenous Stopped 07/10/23 2240)  lactated ringers  bolus 1,000 mL (0 mLs Intravenous Stopped 07/10/23 2300)    ED Course/ Medical Decision Making/ A&P                                 Medical Decision Making This patient presents to the ED for concern of nausea and vomiting, this involves an extensive number of treatment options, and is a complaint that carries with it a high risk of complications and morbidity.  The differential diagnosis includes gastritis, gastroenteritis, cannabis hyperemesis syndrome, gastroparesis, bowel  obstruction, ureterolithiasis, appendicitis, pancreatitis, other   Co morbidities that complicate the patient evaluation  PTSD, hypoglycemia, thrombocytopenia, IBS with chronic diarrhea   Additional history obtained:  Additional history obtained from EMR External records from outside source obtained and reviewed including prior notes and labs   Lab Tests:  I Ordered, and personally interpreted labs.  The pertinent results include: BC with leukocytosis but was a count 14, CMP with decreased CO2 of 17, slight increased anion gap of 16, blood sugar 171, creatinine  slightly elevated from baseline at 1.44   Imaging Studies ordered:  As for CT abdomen pelvis, after shared decision-making discussion with patient he does not want a CT, symptoms fully resolved after fluids, Zofran  scopolamine  patch    Problem List / ED Course / Critical interventions / Medication management  Vomiting-patient had sudden onset nausea and vomiting about 4 hours prior to arrival to his fully resolved medications, he was given 2 L of fluid due to mild AKI.  Patient stating he wants to go home, we discussed his lab abnormalities likely due to his dehydration and vomiting.  Plan was for CT but patient states he is feeling so much better and really does not feel he needs this.  He demonstrates decision-making capacity, his exam now is normal and his vitals have fully normalized as well.  Advised on follow-up for close recheck with PCP and strict return precautions.  He did not show any abnormalities he is not have any chest pain, do not suspect ACS at this time. Having a urinary symptoms, no flank pain, wanted to leave prior to providing UA which is reasonable given lack of symptoms. I ordered medication including Zofran , scopolamine  for nausea and vomiting Reevaluation of the patient after these medicines showed that the patient resolved I have reviewed the patients home medicines and have made adjustments as  needed     Amount and/or Complexity of Data Reviewed Labs: ordered.  Risk Prescription drug management.           Final Clinical Impression(s) / ED Diagnoses Final diagnoses:  Nausea vomiting and diarrhea  Dehydration    Rx / DC Orders ED Discharge Orders          Ordered    ondansetron  (ZOFRAN -ODT) 4 MG disintegrating tablet  Every 6 hours PRN        07/10/23 2325              Aimee Houseman, PA-C 07/11/23 1410    Early Glisson, MD 07/12/23 1552

## 2023-07-10 NOTE — Discharge Instructions (Signed)
 You are seen today for nausea vomiting and diarrhea, fortunately you are feeling much better, we discussed getting a CT scan but since you are feeling better, you have declined this at this time.  If you are feeling worse come back to the ER right away otherwise follow-up close with your PCP.

## 2023-07-11 NOTE — ED Notes (Signed)
Went over d/c paperwork at this time with patient. Pt had no questions, comments or concerns after review and verbally understood them.

## 2023-07-13 ENCOUNTER — Encounter: Payer: Self-pay | Admitting: Nurse Practitioner

## 2023-07-13 ENCOUNTER — Ambulatory Visit (INDEPENDENT_AMBULATORY_CARE_PROVIDER_SITE_OTHER): Payer: Federal, State, Local not specified - PPO | Admitting: Nurse Practitioner

## 2023-07-13 VITALS — BP 128/80 | HR 64 | Ht 71.0 in | Wt 224.0 lb

## 2023-07-13 DIAGNOSIS — E162 Hypoglycemia, unspecified: Secondary | ICD-10-CM

## 2023-07-13 NOTE — Progress Notes (Signed)
 Endocrinology Follow Up Note                                            07/13/2023, 9:58 AM   Subjective:    Patient ID: William Farley, male    DOB: 04-07-1960, PCP Zarwolo, Gloria, FNP   Past Medical History:  Diagnosis Date   Anxiety    Arthritis    CAD (coronary artery disease)    Minimal Non-obstructive CAD seen on coronary CTA 04/2021   Depression    GERD (gastroesophageal reflux disease)    History of kidney stones    History of thrombocytopenia    Hx of syncope    Hypoglycemia    PAF (paroxysmal atrial fibrillation) (HCC)    ChadsVasc Score is 0.  Monitor revealed less than 1% PAF burden.   Seizure (HCC) 02/2021   Sleep apnea    wears CPAP    Past Surgical History:  Procedure Laterality Date   APPENDECTOMY     BIOPSY  05/01/2021   Procedure: BIOPSY;  Surgeon: Ruby Corporal, MD;  [facility];  Service: Endoscopy;;   CERVICAL SPINE SURGERY  2018   CHOLECYSTECTOMY     COLONOSCOPY WITH PROPOFOL  N/A 05/01/2021   Procedure: COLONOSCOPY WITH PROPOFOL ;  Surgeon: Ruby Corporal, MD;  Location: [facility];  Service: Endoscopy;  Laterality: N/A;  1050   CYST EXCISION     CYSTOSCOPY     CYSTOSCOPY WITH RETROGRADE PYELOGRAM, URETEROSCOPY AND STENT PLACEMENT Right 08/08/2020   Procedure: CYSTOSCOPY WITH RIGHT RETROGRADE PYELOGRAM AND RIGHT URETEROSCOPY;  Surgeon: Marco Severs, MD;  Location: [facility];  Service: Urology;  Laterality: Right;   EXTRACORPOREAL SHOCK WAVE LITHOTRIPSY Right 05/21/2020   Procedure: EXTRACORPOREAL SHOCK WAVE LITHOTRIPSY (ESWL);  Surgeon: Marco Severs, MD;  Location: [facility];  Service: Urology;  Laterality: Right;   GASTRIC BYPASS OPEN  2003   INCISIONAL HERNIA REPAIR     kidney stone removal     KNEE ARTHROSCOPY Left    multiple   NECK SURGERY     POLYPECTOMY  05/01/2021   Procedure: POLYPECTOMY;  Surgeon: Ruby Corporal, MD;  [facility];  Service: Endoscopy;;   POSTERIOR FUSION PEDICLE SCREW  PLACEMENT N/A 05/06/2023   Procedure: POSTERIOR LUMBAR INTERBODY FUSION LUMBAR FOUR-FIVE POSTERIOR LATERAL AND INTERBODY FUSION;  Surgeon: Audie Bleacher, MD;  Location: [facility];  Service: Neurosurgery;  Laterality: N/A;   REPLACEMENT TOTAL KNEE Right    RHINOPLASTY     STONE EXTRACTION WITH BASKET Right 08/08/2020   Procedure: STONE EXTRACTION WITH BASKET;  Surgeon: Marco Severs, MD;  Location: [facility];  Service: Urology;  Laterality: Right;   TOTAL KNEE ARTHROPLASTY Left 03/10/2022   Procedure: TOTAL KNEE ARTHROPLASTY;  Surgeon: Darrin Emerald, MD;  Location: [facility];  Service: Orthopedics;  Laterality: Left;   Social History   Socioeconomic History   Marital status: Married    Spouse name: Not on file   Number of children: Not on file   Years of education: Not on file   Highest education level: Associate degree: occupational, Scientist, product/process development, or vocational program  Occupational History   Occupation: Economist  Tobacco Use   Smoking status: Former    Current packs/day: 0.00    Average packs/day: 0.5 packs/day for 10.0 years (5.0 ttl pk-yrs)  Types: Cigarettes    Start date: 05/20/1986    Quit date: 05/20/1996    Years since quitting: 27.1    Passive exposure: Past   Smokeless tobacco: Never  Vaping Use   Vaping status: Never Used  Substance and Sexual Activity   Alcohol use: Not Currently   Drug use: Yes    Types: Marijuana    Comment: daily   Sexual activity: Yes  Other Topics Concern   Not on file  Social History Narrative   Right handed    Married wife   One story home   Social Drivers of Health   Financial Resource Strain: Low Risk  (07/04/2023)   Overall Financial Resource Strain (CARDIA)    Difficulty of Paying Living Expenses: Not very hard  Food Insecurity: No Food Insecurity (07/04/2023)   Hunger Vital Sign    Worried About Running Out of Food in the Last Year: Never true    Ran Out of Food in the Last Year: Never true  Transportation Needs:  Unmet Transportation Needs (07/04/2023)   PRAPARE - Transportation    Lack of Transportation (Medical): Yes    Lack of Transportation (Non-Medical): Yes  Physical Activity: Inactive (07/04/2023)   Exercise Vital Sign    Days of Exercise per Week: 0 days    Minutes of Exercise per Session: 10 min  Stress: Stress Concern Present (07/04/2023)   [employer] of Occupational Health - Occupational Stress Questionnaire    Feeling of Stress : Very much  Social Connections: Moderately Integrated (07/04/2023)   Social Connection and Isolation Panel [NHANES]    Frequency of Communication with Friends and Family: Never    Frequency of Social Gatherings with Friends and Family: Never    Attends Religious Services: More than 4 times per year    Active Member of [employer] or Organizations: Yes    Attends Engineer, structural: More than 4 times per year    Marital Status: Married   Family History  Problem Relation Age of Onset   Heart disease Father 33   Kidney failure Father    Colon cancer Neg Hx    Outpatient Encounter Medications as of 07/13/2023  Medication Sig   allopurinol  (ZYLOPRIM ) 300 MG tablet Take 1 tablet (300 mg total) by mouth daily.   Ciclopirox  0.77 % gel Apply 1 Application topically 2 (two) times daily.   DULoxetine  (CYMBALTA ) 60 MG capsule Take 60 mg by mouth 2 (two) times daily.   gentamicin  ointment (GARAMYCIN ) 0.1 % Apply 1 Application topically 3 (three) times daily.   LORazepam  (ATIVAN ) 1 MG tablet Take 1 mg by mouth 3 (three) times daily as needed for anxiety.   metoprolol  succinate (TOPROL -XL) 25 MG 24 hr tablet Take 0.5 tablets (12.5 mg total) by mouth daily.   Multiple Vitamin (MULTIVITAMIN) tablet Take 1 tablet by mouth 3 (three) times daily.   mupirocin  ointment (BACTROBAN ) 2 % Apply 1 Application topically 2 (two) times daily.   omeprazole (PRILOSEC) 20 MG capsule Take 20 mg by mouth in the morning.   ondansetron  (ZOFRAN -ODT) 4 MG disintegrating tablet Take 1  tablet (4 mg total) by mouth every 6 (six) hours as needed for nausea or vomiting.   tamsulosin  (FLOMAX ) 0.4 MG CAPS capsule Take 1 capsule (0.4 mg total) by mouth daily after supper.   Vitamin D , Ergocalciferol , (DRISDOL ) 1.25 MG (50000 UNIT) CAPS capsule Take 1 capsule (50,000 Units total) by mouth every 7 (seven) days.   zolpidem  (AMBIEN ) 10 MG tablet Take 10  mg by mouth at bedtime.   tiZANidine  (ZANAFLEX ) 4 MG tablet Take 1 tablet (4 mg total) by mouth every 6 (six) hours as needed for muscle spasms. (Patient not taking: Reported on 07/05/2023)   No facility-administered encounter medications on file as of 07/13/2023.   ALLERGIES: Allergies  Allergen Reactions   Codeine Itching, Rash and Other (See Comments)    VACCINATION STATUS: Immunization History  Administered Date(s) Administered   DTaP 08/18/2014   Influenza Inj Mdck Quad Pf 01/02/2016, 06/14/2017, 03/07/2021   Influenza, Seasonal, Injecte, Preservative Fre 12/01/2022   Influenza-Unspecified 12/24/2014, 01/22/2019, 01/15/2020, 02/21/2020, 03/23/2021, 11/21/2021, 01/28/2022   Janssen (J&J) SARS-COV-2 Vaccination 05/29/2019   Moderna Covid-19 Fall Seasonal Vaccine 51yrs & older 12/28/2022   Moderna Covid-19 Vaccine Bivalent Booster 64yrs & up 02/21/2020, 01/27/2021   Moderna Sars-Covid-2 Vaccination 01/15/2020   Pneumococcal Polysaccharide-23 10/02/2014   Pneumococcal-Unspecified 10/02/2014   Tdap 10/02/2014, 01/22/2020, 12/04/2021   Zoster Recombinant(Shingrix) 03/23/2021, 05/05/2021    HPI SANUEL LADNIER is 63 y.o. male who presents today with a medical history as above. he is being seen in follow up after being seen in consultation for hypoglycemia requested by Zarwolo, Gloria, FNP.  he has been dealing with symptoms of hypoglycemia for approximately 6 months.  He notes episodes typically last about 20-30 minutes and self-corrects with time.  There is pattern to his low glucose levels.  He was given a glucometer by the [place]  and has checked fasting levels as well as times he had symptoms of low glucose.  Fasting glucose ranges between the 90-100 range.  His hypoglycemic events have been 40-60 range.  Per the referral, it states he does not have much of an appetite due to severe back pain, so it is possible that his hypoglycemia is related to malnutrition.    He has history of IBS, also history of gastric bypass surgery more than 10 years ago.  He drinks Coke Zero drinks or SF grape flavoring for his water .  He does not have a routine pattern with eating but admits he is a picky eater.   He also has a history of heavy alcohol abuse (quit over 8 years ago)- denies any hx of pancreatitis.  He does admit to using THC to help calm him.  Review of systems  Constitutional: +stable body weight,  current Body mass index is 31.24 kg/m. , no fatigue, no subjective hyperthermia, no subjective hypothermia Eyes: no blurry vision, no xerophthalmia ENT: no sore throat, no nodules palpated in throat, no dysphagia/odynophagia, no hoarseness Cardiovascular: no chest pain, no shortness of breath, no palpitations, no leg swelling Respiratory: no cough, no shortness of breath Gastrointestinal: + intermittent nausea, + blood in stool (seeing GI soon) Musculoskeletal: + back pain-severe (sees pain specialist) Skin: no rashes, no hyperemia Neurological: no tremors, no numbness, no tingling, no dizziness Psychiatric: hx of PTSD and depression currently controlled  Objective:    BP Readings from Last 3 Encounters:  07/13/23 128/80  07/11/23 117/78  07/05/23 (!) 92/59    BP 128/80 (BP Location: Left Arm, Patient Position: Sitting, Cuff Size: Large)   Pulse 64   Ht 5\' 11"  (1.803 m)   Wt 224 lb (101.6 kg)   BMI 31.24 kg/m   Wt Readings from Last 3 Encounters:  07/13/23 224 lb (101.6 kg)  07/10/23 226 lb 0.3 oz (102.5 kg)  07/05/23 226 lb 0.3 oz (102.5 kg)      Physical Exam- Limited  Constitutional:  Body mass index is  31.24 kg/m. ,  not in acute distress, normal state of mind Eyes:  EOMI, no exophthalmos Musculoskeletal: no gross deformities, strength intact in all four extremities, no gross restriction of joint movements Skin:  no rashes, no hyperemia Neurological: no tremor with outstretched hands  CMP ( most recent) CMP     Component Value Date/Time   NA 140 07/10/2023 2135   NA 142 04/09/2023 1010   K 3.7 07/10/2023 2135   CL 107 07/10/2023 2135   CO2 17 (L) 07/10/2023 2135   GLUCOSE 171 (H) 07/10/2023 2135   BUN 16 07/10/2023 2135   BUN 10 04/09/2023 1010   CREATININE 1.44 (H) 07/10/2023 2135   CALCIUM 10.1 07/10/2023 2135   PROT 7.9 07/10/2023 2135   PROT 7.0 04/09/2023 1010   ALBUMIN  4.7 07/10/2023 2135   ALBUMIN  4.6 04/09/2023 1010   AST 31 07/10/2023 2135   ALT 18 07/10/2023 2135   ALKPHOS 98 07/10/2023 2135   BILITOT 1.3 (H) 07/10/2023 2135   BILITOT 0.6 04/09/2023 1010   EGFR 87 04/09/2023 1010   GFRNONAA 56 (L) 07/10/2023 2135     Diabetic Labs (most recent): Lab Results  Component Value Date   HGBA1C 5.3 03/26/2023   HGBA1C 5.3 09/01/2022   HGBA1C 5.1 05/26/2022     Lipid Panel ( most recent) Lipid Panel     Component Value Date/Time   CHOL 166 03/26/2023 0926   TRIG 61 03/26/2023 0926   HDL 65 03/26/2023 0926   CHOLHDL 2.6 03/26/2023 0926   LDLCALC 89 03/26/2023 0926   LABVLDL 12 03/26/2023 0926      Lab Results  Component Value Date   TSH 2.400 03/26/2023   TSH 3.190 09/01/2022   TSH 2.110 05/26/2022   FREET4 1.10 03/26/2023   FREET4 1.09 05/26/2022           Assessment & Plan:   1. Hypoglycemia (Primary)  - William Farley  is being seen at a kind request of Zarwolo, Gloria, FNP.  - I have reviewed his available records and clinically evaluated the patient.  - Based on these reviews, he has reactive hypoglycemia related to inadequate oral intake vs poor quality foods.  I also reviewed CT of abdomen done 02/2020 which shows normal  pancreas, no tumors noted, ruling out insulinoma.  His insulin  and c-peptide levels were normal which indicates his pancreas is making the proper amount of insulin  his body needs.  His HOMA IR score was 2.3 indicating early insulin  resistance, he does note strong family history of type 2 diabetes.  He did have blood work with his PCP recently and his A1c was 4.7%.  He notes he has been making small changes to his diet and admits he still has work to do.  He has had several hypoglycemic events since our initial visit, most of which occurred when he waited too long between meals and was over-exerting himself with physical activity (cutting wood).  He was prescribed a CGM for severe hypoglycemia, unfortunately, his insurance did not provide coverage without the diagnosis of diabetes.  I did give him a Stelo sample today and encouraged him to try that as it does not require insurance approval to use.  We did discuss the importance of nutrition today.  Of the importance of eating 3 well balanced meals so that the body has the fuel it needs to work properly.    -  Suggestion is made for the patient to avoid simple carbohydrates from their diet including Cakes, Sweet Desserts / Pastries,  Ice Cream, Soda (diet and regular), Sweet Tea, Candies, Chips, Cookies, Sweet Pastries, Store Bought Juices, Alcohol in Excess of 1-2 drinks a day, Artificial Sweeteners, Coffee Creamer, and "Sugar-free" Products. This will help patient to have stable blood glucose profile and potentially avoid unintended weight gain.   - I encouraged the patient to switch to unprocessed or minimally processed complex starch and increased protein intake (animal or plant source), fruits, and vegetables.   - Patient is advised to stick to a routine mealtimes to eat 3 meals a day and avoid unnecessary snacks (to snack only to correct hypoglycemia).  The following Lifestyle Medicine recommendations according to American College of Lifestyle  Medicine [facility]) were discussed and offered to patient and he agrees to start the journey:  A. Whole Foods, Plant-based plate comprising of fruits and vegetables, plant-based proteins, whole-grain carbohydrates was discussed in detail with the patient.   A list for source of those nutrients were also provided to the patient.  Patient will use only water  or unsweetened tea for hydration. B.  The need to stay away from risky substances including alcohol, smoking; obtaining 7 to 9 hours of restorative sleep, at least 150 minutes of moderate intensity exercise weekly, the importance of healthy social connections,  and stress reduction techniques were discussed. C.  A full color page of  Calorie density of various food groups per pound showing examples of each food groups was provided to the patient.   - he is advised to maintain close follow up with Zarwolo, Gloria, FNP for primary care needs.    I spent  41  minutes in the care of the patient today including review of labs from CMP, Lipids, Thyroid  Function, Hematology (current and previous including abstractions from other facilities); face-to-face time discussing  his blood glucose readings/logs, discussing hypoglycemia and hyperglycemia episodes and symptoms, medications doses, his options of short and long term treatment based on the latest standards of care / guidelines;  discussion about incorporating lifestyle medicine;  and documenting the encounter. Risk reduction counseling performed per USPSTF guidelines to reduce obesity and cardiovascular risk factors.     Please refer to Patient Instructions for Blood Glucose Monitoring and Insulin /Medications Dosing Guide"  in media tab for additional information. Please  also refer to " Patient Self Inventory" in the Media  tab for reviewed elements of pertinent patient history.  William Farley participated in the discussions, expressed understanding, and voiced agreement with the above plans.  All  questions were answered to his satisfaction. he is encouraged to contact clinic should he have any questions or concerns prior to his return visit.  Follow up plan: Return in about 4 months (around 11/12/2023) for hypoglycemia follow up.   Hulon Magic, [facility] [address] [place], [place] [address] Phone: [phone] Fax: [phone]   07/13/2023, 9:58 AM

## 2023-07-14 ENCOUNTER — Telehealth: Payer: Self-pay

## 2023-07-14 DIAGNOSIS — R972 Elevated prostate specific antigen [PSA]: Secondary | ICD-10-CM

## 2023-07-14 NOTE — Telephone Encounter (Signed)
 Patient called and made aware of ISO psa test unable to be performed. Per. Dr. Claretta Croft patient will need to be scheduled for MRI of the prostate.  Order placed and patient made aware.

## 2023-07-15 ENCOUNTER — Ambulatory Visit (INDEPENDENT_AMBULATORY_CARE_PROVIDER_SITE_OTHER): Payer: Self-pay | Admitting: Gastroenterology

## 2023-07-15 ENCOUNTER — Encounter: Payer: Self-pay | Admitting: Gastroenterology

## 2023-07-15 VITALS — BP 106/64 | HR 72 | Temp 97.1°F | Ht 71.0 in | Wt 223.6 lb

## 2023-07-15 DIAGNOSIS — K625 Hemorrhage of anus and rectum: Secondary | ICD-10-CM | POA: Insufficient documentation

## 2023-07-15 DIAGNOSIS — K219 Gastro-esophageal reflux disease without esophagitis: Secondary | ICD-10-CM | POA: Diagnosis not present

## 2023-07-15 DIAGNOSIS — R197 Diarrhea, unspecified: Secondary | ICD-10-CM | POA: Diagnosis not present

## 2023-07-15 MED ORDER — PEG 3350-KCL-NA BICARB-NACL 420 G PO SOLR: 4000.0000 mL | Freq: Once | ORAL | 0 refills | Status: DC

## 2023-07-15 NOTE — Patient Instructions (Signed)
 We are arranging a colonoscopy and upper endoscopy with Dr. Mordechai April in the near future.  Continue the omeprazole daily as you are doing!  We will see you in 3 months!  Keep me updated on the upcoming tests!  I enjoyed seeing you again today! I value our relationship and want to provide genuine, compassionate, and quality care. You may receive a survey regarding your visit with me, and I welcome your feedback! Thanks so much for taking the time to complete this. I look forward to seeing you again.      Delman Ferns, PhD, ANP-BC [facility]

## 2023-07-15 NOTE — H&P (View-Only) (Signed)
 Gastroenterology Office Note     Primary Care Physician:  Zarwolo, Gloria, FNP  Primary Gastroenterologist: Dr. Mordechai April    Chief Complaint   Chief Complaint  Patient presents with   Rectal Bleeding    Patient here today due to having seen bright red blood in stools since February, which has since gotten better. He says his PSA is evaluated, and thinks he may have prostate cancer. He is scheduled for a MRI on Tuesday next week.       History of Present Illness   William Farley is a 63 y.o. male presenting today with a history of  chronic GERD, chronic loose stools for 40 years, last seen in July 2024. Historically felt to be dealing with IBS-D. Also notes chronic right-sided pain for past 30 years s/p multiple tests previously per patient and even continued s/p cholecystectomy. Celiac serologies negative 2024. Last seen in 2024. Now here for new onset rectal bleeding now resolved in past 2 weeks.   Started rectal bleeding after back surgery in February. Brought pictures that show a large amount of dark blood, large clots, filling the toilet. 06/04/23 large clots. Had intermittent bleeding persistently following surgery then resolved. No bleeding for 2 weeks. May have prostate cancer and undergoing evaluation for that. Unsure if he had abdominal pain with the bleeding or not as recently had back surgery. No rectal pain or itching. Appetite is not great. Omeprazole 20 mg daily and no symptomatic GERD. He does note a history of internal hemorrhoids many years ago.   BMs: used to be 5 times a day. Now not eating much as just doesn't feel like it. Worried about prostate issues, which he says has affected his appetite. Soft stool 1-2 times per day, sometimes diarrhea. Most of the time very loose, not formed. Feels like Zenpep  was not helpful and neither was dicyclomine .   CT with contrast upcoming next month due to hypoglycemic episodes that are unexplained. PSA normally 0.4 to the upper 4  range. MR prostate July 20, 2023. He states he is concerned because he has requested DRE for prostate exam from several providers but not completed. This is worrying him. He is willing to do a rectal exam today for rectal bleeding.    On 4/19 had not eaten anything and worked hard in the field and started N/V, went to the ED and resolved with hydration.   Last colonoscopy 05/01/2021 with a few small polyps removed, 1 tubular adenoma. Recommended 7-year recall. No family history of colorectal malignancy   Remote EGD many years ago at the [place], unclear if any Barrett's or not, was told to take PPI daily.    Past Medical History:  Diagnosis Date   Anxiety    Arthritis    CAD (coronary artery disease)    Minimal Non-obstructive CAD seen on coronary CTA 04/2021   Depression    GERD (gastroesophageal reflux disease)    History of kidney stones    History of thrombocytopenia    Hx of syncope    Hypoglycemia    PAF (paroxysmal atrial fibrillation) (HCC)    ChadsVasc Score is 0.  Monitor revealed less than 1% PAF burden.   Seizure (HCC) 02/2021   Sleep apnea    wears CPAP     Past Surgical History:  Procedure Laterality Date   APPENDECTOMY     BIOPSY  05/01/2021   Procedure: BIOPSY;  Surgeon: Ruby Corporal, MD;  [facility]: [facility];  Service: Endoscopy;;  CERVICAL SPINE SURGERY  2018   CHOLECYSTECTOMY     COLONOSCOPY WITH PROPOFOL  N/A 05/01/2021   Procedure: COLONOSCOPY WITH PROPOFOL ;  Surgeon: Ruby Corporal, MD;  Location: [facility];  Service: Endoscopy;  Laterality: N/A;  1050   CYST EXCISION     CYSTOSCOPY     CYSTOSCOPY WITH RETROGRADE PYELOGRAM, URETEROSCOPY AND STENT PLACEMENT Right 08/08/2020   Procedure: CYSTOSCOPY WITH RIGHT RETROGRADE PYELOGRAM AND RIGHT URETEROSCOPY;  Surgeon: Marco Severs, MD;  Location: [facility];  Service: Urology;  Laterality: Right;   EXTRACORPOREAL SHOCK WAVE LITHOTRIPSY Right 05/21/2020   Procedure: EXTRACORPOREAL SHOCK WAVE LITHOTRIPSY  (ESWL);  Surgeon: Marco Severs, MD;  Location: [facility];  Service: Urology;  Laterality: Right;   GASTRIC BYPASS OPEN  2003   INCISIONAL HERNIA REPAIR     kidney stone removal     KNEE ARTHROSCOPY Left    multiple   NECK SURGERY     POLYPECTOMY  05/01/2021   Procedure: POLYPECTOMY;  Surgeon: Ruby Corporal, MD;  [facility];  Service: Endoscopy;;   POSTERIOR FUSION PEDICLE SCREW PLACEMENT N/A 05/06/2023   Procedure: POSTERIOR LUMBAR INTERBODY FUSION LUMBAR FOUR-FIVE POSTERIOR LATERAL AND INTERBODY FUSION;  Surgeon: Audie Bleacher, MD;  Location: [facility];  Service: Neurosurgery;  Laterality: N/A;   REPLACEMENT TOTAL KNEE Right    RHINOPLASTY     STONE EXTRACTION WITH BASKET Right 08/08/2020   Procedure: STONE EXTRACTION WITH BASKET;  Surgeon: Marco Severs, MD;  Location: [facility];  Service: Urology;  Laterality: Right;   TOTAL KNEE ARTHROPLASTY Left 03/10/2022   Procedure: TOTAL KNEE ARTHROPLASTY;  Surgeon: Darrin Emerald, MD;  Location: [facility];  Service: Orthopedics;  Laterality: Left;    Current Outpatient Medications  Medication Sig Dispense Refill   allopurinol  (ZYLOPRIM ) 300 MG tablet Take 1 tablet (300 mg total) by mouth daily. 90 tablet 3   Ciclopirox  0.77 % gel Apply 1 Application topically 2 (two) times daily. 45 g 1   DULoxetine  (CYMBALTA ) 60 MG capsule Take 60 mg by mouth 2 (two) times daily.     gentamicin  ointment (GARAMYCIN ) 0.1 % Apply 1 Application topically 3 (three) times daily. 15 g 0   LORazepam  (ATIVAN ) 1 MG tablet Take 1 mg by mouth 3 (three) times daily as needed for anxiety.     metoprolol  succinate (TOPROL -XL) 25 MG 24 hr tablet Take 0.5 tablets (12.5 mg total) by mouth daily.     Multiple Vitamin (MULTIVITAMIN) tablet Take 1 tablet by mouth 3 (three) times daily.     mupirocin  ointment (BACTROBAN ) 2 % Apply 1 Application topically 2 (two) times daily. 30 g 2   omeprazole (PRILOSEC) 20 MG capsule Take 20 mg by mouth in the morning.      ondansetron  (ZOFRAN -ODT) 4 MG disintegrating tablet Take 1 tablet (4 mg total) by mouth every 6 (six) hours as needed for nausea or vomiting. 15 tablet 0   tamsulosin  (FLOMAX ) 0.4 MG CAPS capsule Take 1 capsule (0.4 mg total) by mouth daily after supper. 30 capsule 11   Vitamin D , Ergocalciferol , (DRISDOL ) 1.25 MG (50000 UNIT) CAPS capsule Take 1 capsule (50,000 Units total) by mouth every 7 (seven) days. 20 capsule 1   zolpidem  (AMBIEN ) 10 MG tablet Take 10 mg by mouth at bedtime.     No current facility-administered medications for this visit.    Allergies as of 07/15/2023 - Review Complete 07/15/2023  Allergen Reaction Noted   Codeine Itching, Rash, and Other (See Comments)  03/11/2012    Family History  Problem Relation Age of Onset   Heart disease Father 23   Kidney failure Father    Colon cancer Neg Hx     Social History   Socioeconomic History   Marital status: Married    Spouse name: Not on file   Number of children: Not on file   Years of education: Not on file   Highest education level: Associate degree: occupational, Scientist, product/process development, or vocational program  Occupational History   Occupation: Economist  Tobacco Use   Smoking status: Former    Current packs/day: 0.00    Average packs/day: 0.5 packs/day for 10.0 years (5.0 ttl pk-yrs)    Types: Cigarettes    Start date: 05/20/1986    Quit date: 05/20/1996    Years since quitting: 27.1    Passive exposure: Past   Smokeless tobacco: Never  Vaping Use   Vaping status: Never Used  Substance and Sexual Activity   Alcohol use: Not Currently   Drug use: Yes    Types: Marijuana    Comment: daily   Sexual activity: Yes  Other Topics Concern   Not on file  Social History Narrative   Right handed    Married wife   One story home   Social Drivers of Health   Financial Resource Strain: Low Risk  (07/04/2023)   Overall Financial Resource Strain (CARDIA)    Difficulty of Paying Living Expenses: Not very hard   Food Insecurity: No Food Insecurity (07/04/2023)   Hunger Vital Sign    Worried About Running Out of Food in the Last Year: Never true    Ran Out of Food in the Last Year: Never true  Transportation Needs: Unmet Transportation Needs (07/04/2023)   PRAPARE - Transportation    Lack of Transportation (Medical): Yes    Lack of Transportation (Non-Medical): Yes  Physical Activity: Inactive (07/04/2023)   Exercise Vital Sign    Days of Exercise per Week: 0 days    Minutes of Exercise per Session: 10 min  Stress: Stress Concern Present (07/04/2023)   [employer] of Occupational Health - Occupational Stress Questionnaire    Feeling of Stress : Very much  Social Connections: Moderately Integrated (07/04/2023)   Social Connection and Isolation Panel [NHANES]    Frequency of Communication with Friends and Family: Never    Frequency of Social Gatherings with Friends and Family: Never    Attends Religious Services: More than 4 times per year    Active Member of [employer] or Organizations: Yes    Attends Engineer, structural: More than 4 times per year    Marital Status: Married  Catering manager Violence: Not At Risk (03/10/2022)   Humiliation, Afraid, Rape, and Kick questionnaire    Fear of Current or Ex-Partner: No    Emotionally Abused: No    Physically Abused: No    Sexually Abused: No     Review of Systems   Gen: Denies any fever, chills, fatigue, weight loss, lack of appetite.  CV: Denies chest pain, heart palpitations, peripheral edema, syncope.  Resp: Denies shortness of breath at rest or with exertion. Denies wheezing or cough.  GI: Denies dysphagia or odynophagia. Denies jaundice, hematemesis, fecal incontinence. GU : Denies urinary burning, urinary frequency, urinary hesitancy MS: Denies joint pain, muscle weakness, cramps, or limitation of movement.  Derm: Denies rash, itching, dry skin Psych: Denies depression, anxiety, memory loss, and confusion Heme: Denies  bruising, bleeding, and enlarged lymph nodes.  Physical Exam   BP 106/64 (BP Location: Left Arm, Patient Position: Sitting, Cuff Size: Large)   Pulse 72   Temp (!) 97.1 F (36.2 C) (Temporal)   Ht 5\' 11"  (1.803 m)   Wt 223 lb 9.6 oz (101.4 kg)   BMI 31.19 kg/m  General:   Alert and oriented. Pleasant and cooperative. Well-nourished and well-developed.  Head:  Normocephalic and atraumatic. Eyes:  Without icterus Lungs: clear bilaterally Heart: S1 S2 present without murmurs Abdomen:  +BS, soft, non-tender and non-distended. No HSM noted. No guarding or rebound. No masses appreciated.  Rectal:  no external hemorrhoids, possible internal hemorrhoids with DRE but no rectal mass. Prostate exam then per patient's request and smooth on palpation, enlarged.  Msk:  Symmetrical without gross deformities. Normal posture. Extremities:  Without edema. Neurologic:  Alert and  oriented x4;  grossly normal neurologically. Skin:  Intact without significant lesions or rashes. Psych:  Alert and cooperative. Normal mood and affect.   Assessment   William Farley is a 63 y.o. male presenting today with a history of  chronic GERD, chronic loose stools for 40 years, now with rectal bleeding acute onset in Feb but now resolved as of 2 weeks ago,  last seen in July 2024.   Rectal bleeding: large amounts noted on pictures from phone, acute onset following back surgery at home. Associated clots. Persistent bleeding until 2 weeks ago. Recent Hgb normal. No mass on DRE, query internal hemorrhoids. With large volume and last colonoscopy several years ago, he is anxious to pursue diagnostic colonoscopy. As he has chronic diarrhea and last in 2023 without TI intubation that I can tell, recommend ileocolonoscopy so TI can be evaluated. Suspect bleeding could have been ischemic if hypotensive episode, diverticular, or significant hemorrhoid bleeding, less likely malignancy.   Diarrhea: long-standing over 30 years  and even present prior to gastric bypass. Celiac serologies negative last year. Historically felt to be dealing with IBS-D. No significant improvement on dicyclomine  or Zenpep  per patient. Will proceed with ileocolonoscopy. May need further dedicated small bowel imaging if concern for IBD, and could also consider course of Xifaxan for SIBO +/- colestid for bile salt diarrhea, malabsorption s/p gastric bypass.  GERD: controlled on omeprazole daily. He now mentions that the [place] told him he had significant inflammation in his esophagus years ago, concern for pre-cancerous changes. I do not have these records. To be thorough, we will pursue barrett's screening.      PLAN    Proceed with ileocolonoscopy/EGD by Dr. Mordechai April  in near future: the risks, benefits, and alternatives have been discussed with the patient in detail. The patient states understanding and desires to proceed. ASA 2 Continue omeprazole daily Return in 3 months. Consider course of Xifaxan, Colestid. If concern for IBD, will need further dedicated imaging.  Keep follow-up with Urology: upcoming MR prostate   Delman Ferns, PhD, [facility] Gastroenterology

## 2023-07-15 NOTE — Progress Notes (Signed)
 Gastroenterology Office Note     Primary Care Physician:  Zarwolo, Gloria, FNP  Primary Gastroenterologist: Dr. Mordechai April    Chief Complaint   Chief Complaint  Patient presents with   Rectal Bleeding    Patient here today due to having seen bright red blood in stools since February, which has since gotten better. He says his PSA is evaluated, and thinks he may have prostate cancer. He is scheduled for a MRI on Tuesday next week.       History of Present Illness   William Farley is a 63 y.o. male presenting today with a history of  chronic GERD, chronic loose stools for 40 years, last seen in July 2024. Historically felt to be dealing with IBS-D. Also notes chronic right-sided pain for past 30 years s/p multiple tests previously per patient and even continued s/p cholecystectomy. Celiac serologies negative 2024. Last seen in 2024. Now here for new onset rectal bleeding now resolved in past 2 weeks.   Started rectal bleeding after back surgery in February. Brought pictures that show a large amount of dark blood, large clots, filling the toilet. 06/04/23 large clots. Had intermittent bleeding persistently following surgery then resolved. No bleeding for 2 weeks. May have prostate cancer and undergoing evaluation for that. Unsure if he had abdominal pain with the bleeding or not as recently had back surgery. No rectal pain or itching. Appetite is not great. Omeprazole 20 mg daily and no symptomatic GERD. He does note a history of internal hemorrhoids many years ago.   BMs: used to be 5 times a day. Now not eating much as just doesn't feel like it. Worried about prostate issues, which he says has affected his appetite. Soft stool 1-2 times per day, sometimes diarrhea. Most of the time very loose, not formed. Feels like Zenpep  was not helpful and neither was dicyclomine .   CT with contrast upcoming next month due to hypoglycemic episodes that are unexplained. PSA normally 0.4 to the upper 4  range. MR prostate July 20, 2023. He states he is concerned because he has requested DRE for prostate exam from several providers but not completed. This is worrying him. He is willing to do a rectal exam today for rectal bleeding.    On 4/19 had not eaten anything and worked hard in the field and started N/V, went to the ED and resolved with hydration.   Last colonoscopy 05/01/2021 with a few small polyps removed, 1 tubular adenoma. Recommended 7-year recall. No family history of colorectal malignancy   Remote EGD many years ago at the [place], unclear if any Barrett's or not, was told to take PPI daily.    Past Medical History:  Diagnosis Date   Anxiety    Arthritis    CAD (coronary artery disease)    Minimal Non-obstructive CAD seen on coronary CTA 04/2021   Depression    GERD (gastroesophageal reflux disease)    History of kidney stones    History of thrombocytopenia    Hx of syncope    Hypoglycemia    PAF (paroxysmal atrial fibrillation) (HCC)    ChadsVasc Score is 0.  Monitor revealed less than 1% PAF burden.   Seizure (HCC) 02/2021   Sleep apnea    wears CPAP     Past Surgical History:  Procedure Laterality Date   APPENDECTOMY     BIOPSY  05/01/2021   Procedure: BIOPSY;  Surgeon: Ruby Corporal, MD;  [facility];  Service: Endoscopy;;  CERVICAL SPINE SURGERY  2018   CHOLECYSTECTOMY     COLONOSCOPY WITH PROPOFOL  N/A 05/01/2021   Procedure: COLONOSCOPY WITH PROPOFOL ;  Surgeon: Ruby Corporal, MD;  [facility]: [facility];  Service: Endoscopy;  Laterality: N/A;  1050   CYST EXCISION     CYSTOSCOPY     CYSTOSCOPY WITH RETROGRADE PYELOGRAM, URETEROSCOPY AND STENT PLACEMENT Right 08/08/2020   Procedure: CYSTOSCOPY WITH RIGHT RETROGRADE PYELOGRAM AND RIGHT URETEROSCOPY;  Surgeon: Marco Severs, MD;  Location: [facility];  Service: Urology;  Laterality: Right;   EXTRACORPOREAL SHOCK WAVE LITHOTRIPSY Right 05/21/2020   Procedure: EXTRACORPOREAL SHOCK WAVE LITHOTRIPSY  (ESWL);  Surgeon: Marco Severs, MD;  Location: [facility];  Service: Urology;  Laterality: Right;   GASTRIC BYPASS OPEN  2003   INCISIONAL HERNIA REPAIR     kidney stone removal     KNEE ARTHROSCOPY Left    multiple   NECK SURGERY     POLYPECTOMY  05/01/2021   Procedure: POLYPECTOMY;  Surgeon: Ruby Corporal, MD;  [facility];  Service: Endoscopy;;   POSTERIOR FUSION PEDICLE SCREW PLACEMENT N/A 05/06/2023   Procedure: POSTERIOR LUMBAR INTERBODY FUSION LUMBAR FOUR-FIVE POSTERIOR LATERAL AND INTERBODY FUSION;  Surgeon: Audie Bleacher, MD;  Location: [facility];  Service: Neurosurgery;  Laterality: N/A;   REPLACEMENT TOTAL KNEE Right    RHINOPLASTY     STONE EXTRACTION WITH BASKET Right 08/08/2020   Procedure: STONE EXTRACTION WITH BASKET;  Surgeon: Marco Severs, MD;  Location: [facility];  Service: Urology;  Laterality: Right;   TOTAL KNEE ARTHROPLASTY Left 03/10/2022   Procedure: TOTAL KNEE ARTHROPLASTY;  Surgeon: Darrin Emerald, MD;  Location: [facility];  Service: Orthopedics;  Laterality: Left;    Current Outpatient Medications  Medication Sig Dispense Refill   allopurinol  (ZYLOPRIM ) 300 MG tablet Take 1 tablet (300 mg total) by mouth daily. 90 tablet 3   Ciclopirox  0.77 % gel Apply 1 Application topically 2 (two) times daily. 45 g 1   DULoxetine  (CYMBALTA ) 60 MG capsule Take 60 mg by mouth 2 (two) times daily.     gentamicin  ointment (GARAMYCIN ) 0.1 % Apply 1 Application topically 3 (three) times daily. 15 g 0   LORazepam  (ATIVAN ) 1 MG tablet Take 1 mg by mouth 3 (three) times daily as needed for anxiety.     metoprolol  succinate (TOPROL -XL) 25 MG 24 hr tablet Take 0.5 tablets (12.5 mg total) by mouth daily.     Multiple Vitamin (MULTIVITAMIN) tablet Take 1 tablet by mouth 3 (three) times daily.     mupirocin  ointment (BACTROBAN ) 2 % Apply 1 Application topically 2 (two) times daily. 30 g 2   omeprazole (PRILOSEC) 20 MG capsule Take 20 mg by mouth in the morning.      ondansetron  (ZOFRAN -ODT) 4 MG disintegrating tablet Take 1 tablet (4 mg total) by mouth every 6 (six) hours as needed for nausea or vomiting. 15 tablet 0   tamsulosin  (FLOMAX ) 0.4 MG CAPS capsule Take 1 capsule (0.4 mg total) by mouth daily after supper. 30 capsule 11   Vitamin D , Ergocalciferol , (DRISDOL ) 1.25 MG (50000 UNIT) CAPS capsule Take 1 capsule (50,000 Units total) by mouth every 7 (seven) days. 20 capsule 1   zolpidem  (AMBIEN ) 10 MG tablet Take 10 mg by mouth at bedtime.     No current facility-administered medications for this visit.    Allergies as of 07/15/2023 - Review Complete 07/15/2023  Allergen Reaction Noted   Codeine Itching, Rash, and Other (See Comments)  03/11/2012    Family History  Problem Relation Age of Onset   Heart disease Father 23   Kidney failure Father    Colon cancer Neg Hx     Social History   Socioeconomic History   Marital status: Married    Spouse name: Not on file   Number of children: Not on file   Years of education: Not on file   Highest education level: Associate degree: occupational, Scientist, product/process development, or vocational program  Occupational History   Occupation: Economist  Tobacco Use   Smoking status: Former    Current packs/day: 0.00    Average packs/day: 0.5 packs/day for 10.0 years (5.0 ttl pk-yrs)    Types: Cigarettes    Start date: 05/20/1986    Quit date: 05/20/1996    Years since quitting: 27.1    Passive exposure: Past   Smokeless tobacco: Never  Vaping Use   Vaping status: Never Used  Substance and Sexual Activity   Alcohol use: Not Currently   Drug use: Yes    Types: Marijuana    Comment: daily   Sexual activity: Yes  Other Topics Concern   Not on file  Social History Narrative   Right handed    Married wife   One story home   Social Drivers of Health   Financial Resource Strain: Low Risk  (07/04/2023)   Overall Financial Resource Strain (CARDIA)    Difficulty of Paying Living Expenses: Not very hard   Food Insecurity: No Food Insecurity (07/04/2023)   Hunger Vital Sign    Worried About Running Out of Food in the Last Year: Never true    Ran Out of Food in the Last Year: Never true  Transportation Needs: Unmet Transportation Needs (07/04/2023)   PRAPARE - Transportation    Lack of Transportation (Medical): Yes    Lack of Transportation (Non-Medical): Yes  Physical Activity: Inactive (07/04/2023)   Exercise Vital Sign    Days of Exercise per Week: 0 days    Minutes of Exercise per Session: 10 min  Stress: Stress Concern Present (07/04/2023)   [employer] of Occupational Health - Occupational Stress Questionnaire    Feeling of Stress : Very much  Social Connections: Moderately Integrated (07/04/2023)   Social Connection and Isolation Panel [NHANES]    Frequency of Communication with Friends and Family: Never    Frequency of Social Gatherings with Friends and Family: Never    Attends Religious Services: More than 4 times per year    Active Member of [employer] or Organizations: Yes    Attends Engineer, structural: More than 4 times per year    Marital Status: Married  Catering manager Violence: Not At Risk (03/10/2022)   Humiliation, Afraid, Rape, and Kick questionnaire    Fear of Current or Ex-Partner: No    Emotionally Abused: No    Physically Abused: No    Sexually Abused: No     Review of Systems   Gen: Denies any fever, chills, fatigue, weight loss, lack of appetite.  CV: Denies chest pain, heart palpitations, peripheral edema, syncope.  Resp: Denies shortness of breath at rest or with exertion. Denies wheezing or cough.  GI: Denies dysphagia or odynophagia. Denies jaundice, hematemesis, fecal incontinence. GU : Denies urinary burning, urinary frequency, urinary hesitancy MS: Denies joint pain, muscle weakness, cramps, or limitation of movement.  Derm: Denies rash, itching, dry skin Psych: Denies depression, anxiety, memory loss, and confusion Heme: Denies  bruising, bleeding, and enlarged lymph nodes.  Physical Exam   BP 106/64 (BP Location: Left Arm, Patient Position: Sitting, Cuff Size: Large)   Pulse 72   Temp (!) 97.1 F (36.2 C) (Temporal)   Ht 5\' 11"  (1.803 m)   Wt 223 lb 9.6 oz (101.4 kg)   BMI 31.19 kg/m  General:   Alert and oriented. Pleasant and cooperative. Well-nourished and well-developed.  Head:  Normocephalic and atraumatic. Eyes:  Without icterus Lungs: clear bilaterally Heart: S1 S2 present without murmurs Abdomen:  +BS, soft, non-tender and non-distended. No HSM noted. No guarding or rebound. No masses appreciated.  Rectal:  no external hemorrhoids, possible internal hemorrhoids with DRE but no rectal mass. Prostate exam then per patient's request and smooth on palpation, enlarged.  Msk:  Symmetrical without gross deformities. Normal posture. Extremities:  Without edema. Neurologic:  Alert and  oriented x4;  grossly normal neurologically. Skin:  Intact without significant lesions or rashes. Psych:  Alert and cooperative. Normal mood and affect.   Assessment   William Farley is a 63 y.o. male presenting today with a history of  chronic GERD, chronic loose stools for 40 years, now with rectal bleeding acute onset in Feb but now resolved as of 2 weeks ago,  last seen in July 2024.   Rectal bleeding: large amounts noted on pictures from phone, acute onset following back surgery at home. Associated clots. Persistent bleeding until 2 weeks ago. Recent Hgb normal. No mass on DRE, query internal hemorrhoids. With large volume and last colonoscopy several years ago, he is anxious to pursue diagnostic colonoscopy. As he has chronic diarrhea and last in 2023 without TI intubation that I can tell, recommend ileocolonoscopy so TI can be evaluated. Suspect bleeding could have been ischemic if hypotensive episode, diverticular, or significant hemorrhoid bleeding, less likely malignancy.   Diarrhea: long-standing over 30 years  and even present prior to gastric bypass. Celiac serologies negative last year. Historically felt to be dealing with IBS-D. No significant improvement on dicyclomine  or Zenpep  per patient. Will proceed with ileocolonoscopy. May need further dedicated small bowel imaging if concern for IBD, and could also consider course of Xifaxan for SIBO +/- colestid for bile salt diarrhea, malabsorption s/p gastric bypass.  GERD: controlled on omeprazole daily. He now mentions that the [place] told him he had significant inflammation in his esophagus years ago, concern for pre-cancerous changes. I do not have these records. To be thorough, we will pursue barrett's screening.      PLAN    Proceed with ileocolonoscopy/EGD by Dr. Mordechai April  in near future: the risks, benefits, and alternatives have been discussed with the patient in detail. The patient states understanding and desires to proceed. ASA 2 Continue omeprazole daily Return in 3 months. Consider course of Xifaxan, Colestid. If concern for IBD, will need further dedicated imaging.  Keep follow-up with Urology: upcoming MR prostate   Delman Ferns, PhD, [facility] Gastroenterology

## 2023-07-19 ENCOUNTER — Ambulatory Visit: Payer: Federal, State, Local not specified - PPO | Admitting: Nurse Practitioner

## 2023-07-20 ENCOUNTER — Ambulatory Visit (HOSPITAL_COMMUNITY)
Admission: RE | Admit: 2023-07-20 | Discharge: 2023-07-20 | Disposition: A | Discharge status: Home / Self Care | Source: Ambulatory Visit | Attending: Urology | Admitting: Urology

## 2023-07-20 DIAGNOSIS — K4020 Bilateral inguinal hernia, without obstruction or gangrene, not specified as recurrent: Secondary | ICD-10-CM | POA: Diagnosis not present

## 2023-07-20 DIAGNOSIS — R972 Elevated prostate specific antigen [PSA]: Secondary | ICD-10-CM | POA: Diagnosis not present

## 2023-07-20 MED ORDER — GADOBUTROL 1 MMOL/ML IV SOLN
10.0000 mL | Freq: Once | INTRAVENOUS | Status: AC | PRN
  Administered 2023-07-20: 10 mL via INTRAVENOUS

## 2023-07-22 NOTE — Anesthesia Preprocedure Evaluation (Addendum)
 Anesthesia Evaluation  Patient identified by MRN, date of birth, ID band Patient awake    Reviewed: Allergy & Precautions, H&P , NPO status , Patient's Chart, lab work & pertinent test results, reviewed documented beta blocker date and time   Airway Mallampati: II  TM Distance: >3 FB Neck ROM: full    Dental no notable dental hx. (+) Dental Advisory Given, Teeth Intact   Pulmonary sleep apnea , Patient abstained from smoking., former smoker   Pulmonary exam normal breath sounds clear to auscultation       Cardiovascular Exercise Tolerance: Good hypertension, + CAD  Normal cardiovascular exam+ dysrhythmias Atrial Fibrillation  Rhythm:regular Rate:Normal  syncope   Neuro/Psych Seizures -,  PSYCHIATRIC DISORDERS Anxiety Depression       GI/Hepatic Neg liver ROS,GERD  Medicated,,  Endo/Other  negative endocrine ROS    Renal/GU negative Renal ROS  negative genitourinary   Musculoskeletal  (+) Arthritis , Osteoarthritis,    Abdominal   Peds  Hematology  (+) Blood dyscrasia thrombocytopenia   Anesthesia Other Findings   Reproductive/Obstetrics negative OB ROS                             Anesthesia Physical Anesthesia Plan  ASA: 3  Anesthesia Plan: General   Post-op Pain Management: Minimal or no pain anticipated   Induction: Intravenous  PONV Risk Score and Plan: Propofol  infusion  Airway Management Planned: Natural Airway and Nasal Cannula  Additional Equipment: None  Intra-op Plan:   Post-operative Plan:   Informed Consent: I have reviewed the patients History and Physical, chart, labs and discussed the procedure including the risks, benefits and alternatives for the proposed anesthesia with the patient or authorized representative who has indicated his/her understanding and acceptance.     Dental Advisory Given  Plan Discussed with: CRNA  Anesthesia Plan Comments:          Anesthesia Quick Evaluation

## 2023-07-23 ENCOUNTER — Telehealth: Payer: Self-pay | Admitting: Urology

## 2023-07-23 ENCOUNTER — Ambulatory Visit (HOSPITAL_COMMUNITY): Payer: Self-pay | Admitting: Anesthesiology

## 2023-07-23 ENCOUNTER — Encounter (HOSPITAL_COMMUNITY): Payer: Self-pay | Admitting: Internal Medicine

## 2023-07-23 ENCOUNTER — Other Ambulatory Visit: Payer: Self-pay

## 2023-07-23 ENCOUNTER — Ambulatory Visit (HOSPITAL_COMMUNITY)
Admission: RE | Admit: 2023-07-23 | Discharge: 2023-07-23 | Disposition: A | Discharge status: Home / Self Care | Attending: Internal Medicine | Admitting: Internal Medicine

## 2023-07-23 ENCOUNTER — Encounter (HOSPITAL_COMMUNITY)
Admission: RE | Disposition: A | Discharge status: Home / Self Care | Payer: Self-pay | Source: Home / Self Care | Attending: Internal Medicine

## 2023-07-23 DIAGNOSIS — Z87891 Personal history of nicotine dependence: Secondary | ICD-10-CM | POA: Insufficient documentation

## 2023-07-23 DIAGNOSIS — K5730 Diverticulosis of large intestine without perforation or abscess without bleeding: Secondary | ICD-10-CM | POA: Diagnosis not present

## 2023-07-23 DIAGNOSIS — I2510 Atherosclerotic heart disease of native coronary artery without angina pectoris: Secondary | ICD-10-CM | POA: Diagnosis not present

## 2023-07-23 DIAGNOSIS — K449 Diaphragmatic hernia without obstruction or gangrene: Secondary | ICD-10-CM

## 2023-07-23 DIAGNOSIS — F1290 Cannabis use, unspecified, uncomplicated: Secondary | ICD-10-CM | POA: Insufficient documentation

## 2023-07-23 DIAGNOSIS — Z13810 Encounter for screening for upper gastrointestinal disorder: Secondary | ICD-10-CM | POA: Diagnosis not present

## 2023-07-23 DIAGNOSIS — K648 Other hemorrhoids: Secondary | ICD-10-CM

## 2023-07-23 DIAGNOSIS — G4730 Sleep apnea, unspecified: Secondary | ICD-10-CM | POA: Diagnosis not present

## 2023-07-23 DIAGNOSIS — K219 Gastro-esophageal reflux disease without esophagitis: Secondary | ICD-10-CM | POA: Insufficient documentation

## 2023-07-23 DIAGNOSIS — I480 Paroxysmal atrial fibrillation: Secondary | ICD-10-CM | POA: Insufficient documentation

## 2023-07-23 DIAGNOSIS — K2270 Barrett's esophagus without dysplasia: Secondary | ICD-10-CM | POA: Diagnosis not present

## 2023-07-23 DIAGNOSIS — K529 Noninfective gastroenteritis and colitis, unspecified: Secondary | ICD-10-CM | POA: Diagnosis present

## 2023-07-23 DIAGNOSIS — Z980 Intestinal bypass and anastomosis status: Secondary | ICD-10-CM | POA: Diagnosis not present

## 2023-07-23 DIAGNOSIS — K625 Hemorrhage of anus and rectum: Secondary | ICD-10-CM

## 2023-07-23 HISTORY — PX: COLONOSCOPY: SHX5424

## 2023-07-23 HISTORY — PX: ESOPHAGOGASTRODUODENOSCOPY: SHX5428

## 2023-07-23 SURGERY — COLONOSCOPY
Anesthesia: General

## 2023-07-23 MED ORDER — PROPOFOL 10 MG/ML IV BOLUS
INTRAVENOUS | Status: DC | PRN
  Administered 2023-07-23: 25 mg via INTRAVENOUS
  Administered 2023-07-23: 50 mg via INTRAVENOUS
  Administered 2023-07-23: 75 mg via INTRAVENOUS
  Administered 2023-07-23: 50 mg via INTRAVENOUS

## 2023-07-23 MED ORDER — PROPOFOL 500 MG/50ML IV EMUL
INTRAVENOUS | Status: DC | PRN
  Administered 2023-07-23: 150 ug/kg/min via INTRAVENOUS

## 2023-07-23 MED ORDER — STERILE WATER FOR IRRIGATION IR SOLN
Status: DC | PRN
  Administered 2023-07-23: 120 mL

## 2023-07-23 MED ORDER — LIDOCAINE HCL 1 % IJ SOLN
INTRAMUSCULAR | Status: DC | PRN
  Administered 2023-07-23: 50 mg via INTRADERMAL

## 2023-07-23 MED ORDER — PHENYLEPHRINE 80 MCG/ML (10ML) SYRINGE FOR IV PUSH (FOR BLOOD PRESSURE SUPPORT)
PREFILLED_SYRINGE | INTRAVENOUS | Status: DC | PRN
  Administered 2023-07-23: 80 ug via INTRAVENOUS

## 2023-07-23 MED ORDER — LACTATED RINGERS IV SOLN: INTRAVENOUS | Status: DC | PRN

## 2023-07-23 NOTE — Anesthesia Postprocedure Evaluation (Signed)
 Anesthesia Post Note  Patient: William Farley  Procedure(s) Performed: COLONOSCOPY EGD (ESOPHAGOGASTRODUODENOSCOPY)  Patient location during evaluation: Endoscopy Anesthesia Type: General Level of consciousness: awake and alert Pain management: pain level controlled Vital Signs Assessment: post-procedure vital signs reviewed and stable Respiratory status: spontaneous breathing Cardiovascular status: blood pressure returned to baseline and stable Postop Assessment: no apparent nausea or vomiting Anesthetic complications: no   There were no known notable events for this encounter.   Last Vitals:  Vitals:   07/23/23 0911 07/23/23 0914  BP: (!) 94/52 106/63  Pulse: 62   Resp: 13   Temp: 36.4 C   SpO2: 96%     Last Pain:  Vitals:   07/23/23 0911  TempSrc: Oral  PainSc: 0-No pain                 Mollee Neer

## 2023-07-23 NOTE — Telephone Encounter (Signed)
 Please review imaging.

## 2023-07-23 NOTE — Anesthesia Postprocedure Evaluation (Signed)
 Anesthesia Post Note  Patient: William Farley  Procedure(s) Performed: COLONOSCOPY EGD (ESOPHAGOGASTRODUODENOSCOPY)  Patient location during evaluation: Endoscopy Anesthesia Type: General Level of consciousness: awake and alert Pain management: pain level controlled Vital Signs Assessment: post-procedure vital signs reviewed and stable Respiratory status: spontaneous breathing, nonlabored ventilation and respiratory function stable Cardiovascular status: blood pressure returned to baseline and stable Postop Assessment: no apparent nausea or vomiting Anesthetic complications: no   There were no known notable events for this encounter.   Last Vitals:  Vitals:   07/23/23 0911 07/23/23 0914  BP: (!) 94/52 106/63  Pulse: 62   Resp: 13   Temp: 36.4 C   SpO2: 96%     Last Pain:  Vitals:   07/23/23 0911  TempSrc: Oral  PainSc: 0-No pain                 Tayonna Bacha L Cam Dauphin

## 2023-07-23 NOTE — Discharge Instructions (Addendum)
 EGD Discharge instructions Please read the instructions outlined below and refer to this sheet in the next few weeks. These discharge instructions provide you with general information on caring for yourself after you leave the hospital. Your doctor may also give you specific instructions. While your treatment has been planned according to the most current medical practices available, unavoidable complications occasionally occur. If you have any problems or questions after discharge, please call your doctor. ACTIVITY You may resume your regular activity but move at a slower pace for the next 24 hours.  Take frequent rest periods for the next 24 hours.  Walking will help expel (get rid of) the air and reduce the bloated feeling in your abdomen.  No driving for 24 hours (because of the anesthesia (medicine) used during the test).  You may shower.  Do not sign any important legal documents or operate any machinery for 24 hours (because of the anesthesia used during the test).  NUTRITION Drink plenty of fluids.  You may resume your normal diet.  Begin with a light meal and progress to your normal diet.  Avoid alcoholic beverages for 24 hours or as instructed by your caregiver.  MEDICATIONS You may resume your normal medications unless your caregiver tells you otherwise.  WHAT YOU CAN EXPECT TODAY You may experience abdominal discomfort such as a feeling of fullness or "gas" pains.  FOLLOW-UP Your doctor will discuss the results of your test with you.  SEEK IMMEDIATE MEDICAL ATTENTION IF ANY OF THE FOLLOWING OCCUR: Excessive nausea (feeling sick to your stomach) and/or vomiting.  Severe abdominal pain and distention (swelling).  Trouble swallowing.  Temperature over 101 F (37.8 C).  Rectal bleeding or vomiting of blood.     Colonoscopy Discharge Instructions  Read the instructions outlined below and refer to this sheet in the next few weeks. These discharge instructions provide you with  general information on caring for yourself after you leave the hospital. Your doctor may also give you specific instructions. While your treatment has been planned according to the most current medical practices available, unavoidable complications occasionally occur.   ACTIVITY You may resume your regular activity, but move at a slower pace for the next 24 hours.  Take frequent rest periods for the next 24 hours.  Walking will help get rid of the air and reduce the bloated feeling in your belly (abdomen).  No driving for 24 hours (because of the medicine (anesthesia) used during the test).   Do not sign any important legal documents or operate any machinery for 24 hours (because of the anesthesia used during the test).  NUTRITION Drink plenty of fluids.  You may resume your normal diet as instructed by your doctor.  Begin with a light meal and progress to your normal diet. Heavy or fried foods are harder to digest and may make you feel sick to your stomach (nauseated).  Avoid alcoholic beverages for 24 hours or as instructed.  MEDICATIONS You may resume your normal medications unless your doctor tells you otherwise.  WHAT YOU CAN EXPECT TODAY Some feelings of bloating in the abdomen.  Passage of more gas than usual.  Spotting of blood in your stool or on the toilet paper.  IF YOU HAD POLYPS REMOVED DURING THE COLONOSCOPY: No aspirin  products for 7 days or as instructed.  No alcohol for 7 days or as instructed.  Eat a soft diet for the next 24 hours.  FINDING OUT THE RESULTS OF YOUR TEST Not all test results are  available during your visit. If your test results are not back during the visit, make an appointment with your caregiver to find out the results. Do not assume everything is normal if you have not heard from your caregiver or the medical facility. It is important for you to follow up on all of your test results.  SEEK IMMEDIATE MEDICAL ATTENTION IF: You have more than a spotting of  blood in your stool.  Your belly is swollen (abdominal distention).  You are nauseated or vomiting.  You have a temperature over 101.  You have abdominal pain or discomfort that is severe or gets worse throughout the day.   Your upper endoscopy revealed findings consistent with short segment Barrett's esophagus.  I took samples today.  Stomach and small bowel otherwise appeared normal.  Gastric bypass intact and healthy.  Continue omeprazole daily.  Overall, your colon appeared very healthy.  I did not see any active inflammation indicative of underlying inflammatory bowel disease such as Crohn's disease or ulcerative colitis throughout your colon or end portion of your small bowel.  I took biopsies of your colon to further evaluate.  Await pathology results, my office will contact you.  Your colonoscopy was relatively unremarkable.  I did not find any polyps or evidence of colon cancer.  I recommend repeating colonoscopy in 7-10 years for colon cancer screening purposes.    \You do have diverticulosis and internal hemorrhoids. I would recommend increasing fiber in your diet or adding OTC Benefiber/Metamucil. Be sure to drink at least 4 to 6 glasses of water  daily. Follow-up with GI in 2 to 3 months.   I hope you have a great rest of your week!  Rolando Cliche. Mordechai April, D.O. Gastroenterology and Hepatology [facility]

## 2023-07-23 NOTE — Op Note (Signed)
 [facility] Patient Name: William Farley Procedure Date: 07/23/2023 8:25 AM MRN: [id number] Date of Birth: April 01, 1960 Attending MD: Rolando Cliche. Mordechai April , [place], [id number] CSN: [id number] Age: 63 Admit Type: Outpatient Procedure:                Upper GI endoscopy Indications:              Screening for Barrett's esophagus, Screening for                            Barrett's esophagus in patient at risk for this                            condition Providers:                Rolando Cliche. Mordechai April, DO, Willena Harp, Jolee Naval, Technician Referring MD:              Medicines:                See the Anesthesia note for documentation of the                            administered medications Complications:            No immediate complications. Estimated Blood Loss:     Estimated blood loss was minimal. Procedure:                Pre-Anesthesia Assessment:                           - The anesthesia plan was to use monitored                            anesthesia care (MAC).                           After obtaining informed consent, the endoscope was                            passed under direct vision. Throughout the                            procedure, the patient's blood pressure, pulse, and                            oxygen saturations were monitored continuously. The                            [id number]-H190 ([id number]) scope was introduced through the                            mouth, and advanced to the second part of duodenum.                            The upper GI endoscopy was accomplished  without                            difficulty. The patient tolerated the procedure                            well. Scope In: 8:40:37 AM Scope Out: 8:46:32 AM Total Procedure Duration: 0 hours 5 minutes 55 seconds  Findings:      There were esophageal mucosal changes classified as Barrett's stage       C0-M2 per Prague criteria present at the gastroesophageal junction.  The       maximum longitudinal extent of these mucosal changes was 2 cm in length.       Mucosa was biopsied with a cold forceps for histology. One specimen       bottle was sent to pathology.      Evidence of a Roux-en-Y gastrojejunostomy was found. The gastrojejunal       anastomosis was characterized by healthy appearing mucosa. This was       traversed. The jejunojejunal anastomosis was characterized by healthy       appearing mucosa. The duodenum-to-jejunum limb was not examined as it       could not be reached.      The examined jejunum was normal.      A small hiatal hernia was present. Impression:               - Esophageal mucosal changes classified as                            Barrett's stage C0-M2 per Prague criteria. Biopsied.                           - Roux-en-Y gastrojejunostomy with gastrojejunal                            anastomosis characterized by healthy appearing                            mucosa.                           - Normal examined jejunum.                           - Small hiatal hernia. Moderate Sedation:      Per Anesthesia Care Recommendation:           - Patient has a contact number available for                            emergencies. The signs and symptoms of potential                            delayed complications were discussed with the                            patient. Return to normal activities tomorrow.  Written discharge instructions were provided to the                            patient.                           - Resume previous diet.                           - Continue present medications.                           - Await pathology results.                           - Repeat upper endoscopy in 5 years for                            surveillance.                           - Return to GI clinic.                           - Use a proton pump inhibitor PO daily. Procedure Code(s):        --- Professional  ---                           [phone], Esophagogastroduodenoscopy, flexible,                            transoral; with biopsy, single or multiple Diagnosis Code(s):        --- Professional ---                           K22.70, Barrett's esophagus without dysplasia                           Z98.0, Intestinal bypass and anastomosis status                           Z13.810, Encounter for screening for upper                            gastrointestinal disorder CPT copyright 2022 American Medical Association. All rights reserved. The codes documented in this report are preliminary and upon coder review may  be revised to meet current compliance requirements. Rolando Cliche. Mordechai April, DO Rolando Cliche. Mordechai April, DO 07/23/2023 8:50:42 AM This report has been signed electronically. Number of Addenda: 0

## 2023-07-23 NOTE — Telephone Encounter (Signed)
 Wants someone to go over MRI results. He saw them on Mychart but wants to speak with someone

## 2023-07-23 NOTE — Op Note (Signed)
 [facility] Patient Name: William Farley Procedure Date: 07/23/2023 8:24 AM MRN: [id number] Date of Birth: Feb 01, 1961 Attending MD: Rolando Cliche. Mordechai April , [place], [id number] CSN: [id number] Age: 63 Admit Type: Outpatient Procedure:                Colonoscopy Indications:              Chronic diarrhea, Rectal bleeding Providers:                Rolando Cliche. Mordechai April, DO, Willena Harp, Jolee Naval, Technician Referring MD:              Medicines:                See the Anesthesia note for documentation of the                            administered medications Complications:            No immediate complications. Estimated Blood Loss:     Estimated blood loss was minimal. Procedure:                Pre-Anesthesia Assessment:                           - The anesthesia plan was to use monitored                            anesthesia care (MAC).                           After obtaining informed consent, the colonoscope                            was passed under direct vision. Throughout the                            procedure, the patient's blood pressure, pulse, and                            oxygen saturations were monitored continuously. The                            [id number]-HQ190L ([id number] scope was introduced through                            the anus and advanced to the the terminal ileum,                            with identification of the appendiceal orifice and                            IC valve. The colonoscopy was performed without                            difficulty. The patient tolerated the procedure  well. The quality of the bowel preparation was                            evaluated using the BBPS [facility] Bowel Preparation                            Scale) with scores of: Right Colon = 2 (minor                            amount of residual staining, small fragments of                            stool and/or opaque  liquid, but mucosa seen well),                            Transverse Colon = 2 (minor amount of residual                            staining, small fragments of stool and/or opaque                            liquid, but mucosa seen well) and Left Colon = 3                            (entire mucosa seen well with no residual staining,                            small fragments of stool or opaque liquid). The                            total BBPS score equals 7. The quality of the bowel                            preparation was good. Scope In: 8:51:35 AM Scope Out: 9:06:09 AM Scope Withdrawal Time: 0 hours 11 minutes 7 seconds  Total Procedure Duration: 0 hours 14 minutes 34 seconds  Findings:      Non-bleeding internal hemorrhoids were found during retroflexion. The       hemorrhoids were moderate.      Multiple medium-mouthed and small-mouthed diverticula were found in the       sigmoid colon.      The terminal ileum appeared normal.      Biopsies for histology were taken with a cold forceps from the ascending       colon, transverse colon and descending colon for evaluation of       microscopic colitis.      The exam was otherwise without abnormality. Impression:               - Non-bleeding internal hemorrhoids.                           - Diverticulosis in the sigmoid colon.                           -  The examined portion of the ileum was normal.                           - The examination was otherwise normal.                           - Biopsies were taken with a cold forceps from the                            ascending colon, transverse colon and descending                            colon for evaluation of microscopic colitis. Moderate Sedation:      Per Anesthesia Care Recommendation:           - Patient has a contact number available for                            emergencies. The signs and symptoms of potential                            delayed complications were  discussed with the                            patient. Return to normal activities tomorrow.                            Written discharge instructions were provided to the                            patient.                           - Resume previous diet.                           - Continue present medications.                           - Await pathology results.                           - Repeat colonoscopy in 7-10 years for screening                            purposes.                           - Return to GI clinic in 3 months.                           - Consider trial of cholestyramine                           - Conosider hemorrhoid banding if bleeding  continues. Procedure Code(s):        --- Professional ---                           [phone], Colonoscopy, flexible; with biopsy, single                            or multiple Diagnosis Code(s):        --- Professional ---                           K64.8, Other hemorrhoids                           K52.9, Noninfective gastroenteritis and colitis,                            unspecified                           K62.5, Hemorrhage of anus and rectum                           K57.30, Diverticulosis of large intestine without                            perforation or abscess without bleeding CPT copyright 2022 American Medical Association. All rights reserved. The codes documented in this report are preliminary and upon coder review may  be revised to meet current compliance requirements. Rolando Cliche. Mordechai April, DO Rolando Cliche. Amanie Mcculley, DO 07/23/2023 9:11:56 AM This report has been signed electronically. Number of Addenda: 0

## 2023-07-23 NOTE — Transfer of Care (Signed)
 Immediate Anesthesia Transfer of Care Note  Patient: William Farley  Procedure(s) Performed: COLONOSCOPY EGD (ESOPHAGOGASTRODUODENOSCOPY)  Patient Location: Endoscopy Unit  Anesthesia Type:General  Level of Consciousness: awake  Airway & Oxygen Therapy: Patient Spontanous Breathing  Post-op Assessment: Report given to RN  Post vital signs: Reviewed  Last Vitals:  Vitals Value Taken Time  BP    Temp    Pulse    Resp    SpO2      Last Pain:  Vitals:   07/23/23 0835  TempSrc:   PainSc: 0-No pain      Patients Stated Pain Goal: 8 (07/23/23 0725)  Complications: No notable events documented.

## 2023-07-23 NOTE — Interval H&P Note (Signed)
 History and Physical Interval Note:  07/23/2023 8:03 AM  William Farley  has presented today for surgery, with the diagnosis of Rectal bleeding, diarrhea, barretts screening.  The various methods of treatment have been discussed with the patient and family. After consideration of risks, benefits and other options for treatment, the patient has consented to  Procedure(s) with comments: COLONOSCOPY (N/A) - 8:30am;asa 2 ileocolonoscopy EGD (ESOPHAGOGASTRODUODENOSCOPY) (N/A) - 8:30am;asa 2 as a surgical intervention.  The patient's history has been reviewed, patient examined, no change in status, stable for surgery.  I have reviewed the patient's chart and labs.  Questions were answered to the patient's satisfaction.     Vinetta Greening

## 2023-07-26 ENCOUNTER — Encounter (HOSPITAL_COMMUNITY): Payer: Self-pay | Admitting: Internal Medicine

## 2023-07-26 LAB — SURGICAL PATHOLOGY

## 2023-07-27 ENCOUNTER — Encounter (HOSPITAL_COMMUNITY): Payer: Self-pay

## 2023-07-28 ENCOUNTER — Encounter: Payer: Self-pay | Admitting: Urology

## 2023-07-28 ENCOUNTER — Other Ambulatory Visit: Payer: Self-pay

## 2023-07-28 MED ORDER — TAMSULOSIN HCL 0.4 MG PO CAPS: 0.4000 mg | ORAL_CAPSULE | Freq: Every day | ORAL | 11 refills | Status: DC

## 2023-08-04 ENCOUNTER — Encounter (HOSPITAL_COMMUNITY): Payer: Self-pay | Admitting: Radiology

## 2023-08-04 ENCOUNTER — Ambulatory Visit (HOSPITAL_COMMUNITY)
Admission: RE | Admit: 2023-08-04 | Discharge: 2023-08-04 | Disposition: A | Discharge status: Home / Self Care | Source: Ambulatory Visit | Attending: Nurse Practitioner | Admitting: Nurse Practitioner

## 2023-08-04 DIAGNOSIS — E162 Hypoglycemia, unspecified: Secondary | ICD-10-CM | POA: Insufficient documentation

## 2023-08-04 MED ORDER — IOHEXOL 300 MG/ML  SOLN
80.0000 mL | Freq: Once | INTRAMUSCULAR | Status: AC | PRN
  Administered 2023-08-04: 80 mL via INTRAVENOUS

## 2023-08-04 MED ORDER — IOHEXOL 9 MG/ML PO SOLN
500.0000 mL | ORAL | Status: AC
  Administered 2023-08-04: 500 mL via ORAL

## 2023-08-11 ENCOUNTER — Encounter: Payer: Self-pay | Admitting: Urology

## 2023-08-19 DIAGNOSIS — K625 Hemorrhage of anus and rectum: Secondary | ICD-10-CM

## 2023-08-19 MED ORDER — HYDROCORTISONE (PERIANAL) 2.5 % EX CREA: 1.0000 | TOPICAL_CREAM | Freq: Two times a day (BID) | CUTANEOUS | 1 refills | Status: DC

## 2023-08-19 NOTE — Telephone Encounter (Signed)
 William Farley, please put patient in an available slot with me next week. Can be urgent if needed. Rectal bleeding. He is on MyChart, so you can send him updates on there regarding appt.

## 2023-08-25 DIAGNOSIS — K625 Hemorrhage of anus and rectum: Secondary | ICD-10-CM | POA: Diagnosis not present

## 2023-08-26 ENCOUNTER — Ambulatory Visit: Payer: Self-pay | Admitting: Gastroenterology

## 2023-08-26 LAB — CBC WITH DIFFERENTIAL/PLATELET
Basophils Absolute: 0 10*3/uL (ref 0.0–0.2)
Basos: 1 %
EOS (ABSOLUTE): 0.2 10*3/uL (ref 0.0–0.4)
Eos: 4 %
Hematocrit: 42.6 % (ref 37.5–51.0)
Hemoglobin: 14 g/dL (ref 13.0–17.7)
Immature Grans (Abs): 0 10*3/uL (ref 0.0–0.1)
Immature Granulocytes: 0 %
Lymphocytes Absolute: 0.8 10*3/uL (ref 0.7–3.1)
Lymphs: 13 %
MCH: 29.4 pg (ref 26.6–33.0)
MCHC: 32.9 g/dL (ref 31.5–35.7)
MCV: 90 fL (ref 79–97)
Monocytes Absolute: 0.5 10*3/uL (ref 0.1–0.9)
Monocytes: 9 %
Neutrophils Absolute: 4.5 10*3/uL (ref 1.4–7.0)
Neutrophils: 73 %
Platelets: 173 10*3/uL (ref 150–450)
RBC: 4.76 x10E6/uL (ref 4.14–5.80)
RDW: 13.2 % (ref 11.6–15.4)
WBC: 6.1 10*3/uL (ref 3.4–10.8)

## 2023-09-01 ENCOUNTER — Ambulatory Visit (INDEPENDENT_AMBULATORY_CARE_PROVIDER_SITE_OTHER): Admitting: Gastroenterology

## 2023-09-01 ENCOUNTER — Encounter: Payer: Self-pay | Admitting: Gastroenterology

## 2023-09-01 VITALS — BP 111/77 | HR 102 | Temp 98.3°F | Ht 71.0 in | Wt 205.8 lb

## 2023-09-01 DIAGNOSIS — K648 Other hemorrhoids: Secondary | ICD-10-CM | POA: Insufficient documentation

## 2023-09-01 DIAGNOSIS — K641 Second degree hemorrhoids: Secondary | ICD-10-CM | POA: Diagnosis not present

## 2023-09-01 NOTE — Patient Instructions (Signed)
  Please avoid straining.  You should limit your toilet time to 2-3 minutes at the most.    Please call me with any concerns or issues!  I will see you in follow-up for additional banding in several weeks.     I enjoyed seeing you again today! I value our relationship and want to provide genuine, compassionate, and quality care. You may receive a survey regarding your visit with me, and I welcome your feedback! Thanks so much for taking the time to complete this. I look forward to seeing you again.      Gelene Mink, PhD, ANP-BC [facility]

## 2023-09-01 NOTE — Progress Notes (Signed)
    [facility] BANDING PROCEDURE NOTE  William Farley is a 63 y.o. male presenting today for consideration of hemorrhoid banding. Last colonoscopy May 2025 with non-bleeding internal hemorrhoids, diverticulosis, normal examined TI, random colonic biopsies unremarkable. His primary concern is significant rectal bleeding. No rectal pain. Hgb recently checked and normal.    The patient presents with symptomatic grade 2 hemorrhoids, unresponsive to maximal medical therapy, requesting rubber band ligation of his hemorrhoidal disease. All risks, benefits, and alternative forms of therapy were described and informed consent was obtained.   The decision was made to band the left lateral internal hemorrhoid, and the [facility]'Regan System was used to perform band ligation without complication. Digital anorectal examination was then performed to assure proper positioning of the band, and to adjust the banded tissue as required. The patient was discharged home without pain or other issues. Dietary and behavioral recommendations were given, along with follow-up instructions. The patient will return in several weeks for followup and possible additional banding as required. As he is leaving for an [place] cruise July 17th, we will band the remaining columns at next visit, July 2nd. This allows for an extra neutral banding if needed.   No complications were encountered and the patient tolerated the procedure well.   Delman Ferns, PhD, ANP-BC [facility]

## 2023-09-02 ENCOUNTER — Encounter: Payer: Self-pay | Admitting: Family Medicine

## 2023-09-02 ENCOUNTER — Ambulatory Visit: Admitting: Family Medicine

## 2023-09-02 VITALS — BP 118/71 | HR 79 | Resp 16 | Ht 71.0 in | Wt 208.0 lb

## 2023-09-02 DIAGNOSIS — F3289 Other specified depressive episodes: Secondary | ICD-10-CM | POA: Diagnosis not present

## 2023-09-02 DIAGNOSIS — F411 Generalized anxiety disorder: Secondary | ICD-10-CM | POA: Diagnosis not present

## 2023-09-02 DIAGNOSIS — F4312 Post-traumatic stress disorder, chronic: Secondary | ICD-10-CM | POA: Diagnosis not present

## 2023-09-02 NOTE — Patient Instructions (Signed)
 I appreciate the opportunity to provide care to you today!    Follow up:  1 months   Referrals today-  behavioral health     Please continue to a heart-healthy diet and increase your physical activities. Try to exercise for at least five days a week.    It was a pleasure to see you and I look forward to continuing to work together on your health and well-being. Please do not hesitate to call the office if you need care or have questions about your care.  In case of emergency, please visit the Emergency Department for urgent care, or contact our clinic at [phone] to schedule an appointment. We're here to help you!   Have a wonderful day and week. With Gratitude, Ashlea Dusing MSN, FNP-BC

## 2023-09-02 NOTE — Progress Notes (Addendum)
 Established Patient Office Visit  Subjective:  Patient ID: William Farley, male    DOB: July 03, 1960  Age: 63 y.o. MRN: [id number]  CC:  Chief Complaint  Patient presents with   Anxiety    States he was taken off of meds cold [place] by the [place] and he hasn't slept much in about 4 days.     HPI William Farley is a 63 y.o. male with past medical history of PTSD, anxiety, depression, insomnia presents for f/u of  chronic medical conditions. For the details of today's visit, please refer to the assessment and plan.     Past Medical History:  Diagnosis Date   Anxiety    Arthritis    CAD (coronary artery disease)    Minimal Non-obstructive CAD seen on coronary CTA 04/2021   Depression    GERD (gastroesophageal reflux disease)    History of kidney stones    History of thrombocytopenia    Hx of syncope    Hypoglycemia    PAF (paroxysmal atrial fibrillation) (HCC)    ChadsVasc Score is 0.  Monitor revealed less than 1% PAF burden.   Seizure (HCC) 02/2021   Sleep apnea    wears CPAP     Past Surgical History:  Procedure Laterality Date   APPENDECTOMY     BIOPSY  05/01/2021   Procedure: BIOPSY;  Surgeon: [name], MD;  [facility];  Service: Endoscopy;;   CERVICAL SPINE SURGERY  2018   CHOLECYSTECTOMY     COLONOSCOPY N/A 07/23/2023   Procedure: COLONOSCOPY;  Surgeon: [name], DO;  [facility];  Service: Endoscopy;  Laterality: N/A;  8:30am;asa 2 ileocolonoscopy   COLONOSCOPY WITH PROPOFOL  N/A 05/01/2021   Procedure: COLONOSCOPY WITH PROPOFOL ;  Surgeon: [name], MD;  [facility];  Service: Endoscopy;  Laterality: N/A;  1050   CYST EXCISION     CYSTOSCOPY     CYSTOSCOPY WITH RETROGRADE PYELOGRAM, URETEROSCOPY AND STENT PLACEMENT Right 08/08/2020   Procedure: CYSTOSCOPY WITH RIGHT RETROGRADE PYELOGRAM AND RIGHT URETEROSCOPY;  Surgeon: [name], MD;  Location: [facility];  Service: Urology;  Laterality: Right;    ESOPHAGOGASTRODUODENOSCOPY N/A 07/23/2023   Procedure: EGD (ESOPHAGOGASTRODUODENOSCOPY);  Surgeon: [name], DO;  [facility];  Service: Endoscopy;  Laterality: N/A;  8:30am;asa 2   EXTRACORPOREAL SHOCK WAVE LITHOTRIPSY Right 05/21/2020   Procedure: EXTRACORPOREAL SHOCK WAVE LITHOTRIPSY (ESWL);  Surgeon: [name], MD;  Location: [facility];  Service: Urology;  Laterality: Right;   GASTRIC BYPASS OPEN  2003   INCISIONAL HERNIA REPAIR     kidney stone removal     KNEE ARTHROSCOPY Left    multiple   NECK SURGERY     POLYPECTOMY  05/01/2021   Procedure: POLYPECTOMY;  Surgeon: [name], MD;  [facility];  Service: Endoscopy;;   POSTERIOR FUSION PEDICLE SCREW PLACEMENT N/A 05/06/2023   Procedure: POSTERIOR LUMBAR INTERBODY FUSION LUMBAR FOUR-FIVE POSTERIOR LATERAL AND INTERBODY FUSION;  Surgeon: [name], MD;  Location: [facility];  Service: Neurosurgery;  Laterality: N/A;   REPLACEMENT TOTAL KNEE Right    RHINOPLASTY     STONE EXTRACTION WITH BASKET Right 08/08/2020   Procedure: STONE EXTRACTION WITH BASKET;  Surgeon: [name], MD;  Location: [facility];  Service: Urology;  Laterality: Right;   TOTAL KNEE ARTHROPLASTY Left 03/10/2022   Procedure: TOTAL KNEE ARTHROPLASTY;  Surgeon: [name], MD;  Location: [facility];  Service: Orthopedics;  Laterality: Left;    Family History  Problem Relation Age of Onset   Heart disease Father 70   Kidney failure Father    Colon cancer Neg Hx     Social History   Socioeconomic History   Marital status: Married    Spouse name: Not on file   Number of children: Not on file   Years of education: Not on file   Highest education level: Associate degree: occupational, Scientist, product/process development, or vocational program  Occupational History   Occupation: Economist  Tobacco Use   Smoking status: Former    Current packs/day: 0.00    Average packs/day: 0.5 packs/day for 10.0 years (5.0 ttl pk-yrs)     Types: Cigarettes    Start date: 05/20/1986    Quit date: 05/20/1996    Years since quitting: 27.3    Passive exposure: Past   Smokeless tobacco: Never  Vaping Use   Vaping status: Never Used  Substance and Sexual Activity   Alcohol use: Not Currently   Drug use: Yes    Types: Marijuana    Comment: daily   Sexual activity: Yes  Other Topics Concern   Not on file  Social History Narrative   Right handed    Married wife   One story home   Social Drivers of Health   Financial Resource Strain: Low Risk  (07/04/2023)   Overall Financial Resource Strain (CARDIA)    Difficulty of Paying Living Expenses: Not very hard  Food Insecurity: No Food Insecurity (07/04/2023)   Hunger Vital Sign    Worried About Running Out of Food in the Last Year: Never true    Ran Out of Food in the Last Year: Never true  Transportation Needs: Unmet Transportation Needs (07/04/2023)   PRAPARE - Transportation    Lack of Transportation (Medical): Yes    Lack of Transportation (Non-Medical): Yes  Physical Activity: Unknown (07/04/2023)   Exercise Vital Sign    Days of Exercise per Week: 0 days    Minutes of Exercise per Session: Not on file  Recent Concern: Physical Activity - Inactive (07/04/2023)   Exercise Vital Sign    Days of Exercise per Week: 0 days    Minutes of Exercise per Session: 10 min  Stress: Stress Concern Present (07/04/2023)   [employer] of Occupational Health - Occupational Stress Questionnaire    Feeling of Stress : Very much  Social Connections: Moderately Integrated (07/04/2023)   Social Connection and Isolation Panel    Frequency of Communication with Friends and Family: Never    Frequency of Social Gatherings with Friends and Family: Never    Attends Religious Services: More than 4 times per year    Active Member of [employer] or Organizations: Yes    Attends Engineer, structural: More than 4 times per year    Marital Status: Married  Catering manager Violence: Not At  Risk (03/10/2022)   Humiliation, Afraid, Rape, and Kick questionnaire    Fear of Current or Ex-Partner: No    Emotionally Abused: No    Physically Abused: No    Sexually Abused: No    Outpatient Medications Prior to Visit  Medication Sig Dispense Refill   metoprolol  succinate (TOPROL -XL) 25 MG 24 hr tablet Take 0.5 tablets (12.5 mg total) by mouth daily.     omeprazole (PRILOSEC) 20 MG capsule Take 20 mg by mouth in the morning.     tamsulosin  (FLOMAX ) 0.4 MG CAPS capsule Take 1 capsule (0.4  mg total) by mouth daily after supper. 30 capsule 11   busPIRone (BUSPAR) 10 MG tablet Take 10 mg by mouth 3 (three) times daily.     DULoxetine  (CYMBALTA ) 60 MG capsule Take 60 mg by mouth 2 (two) times daily.     allopurinol  (ZYLOPRIM ) 300 MG tablet Take 1 tablet (300 mg total) by mouth daily. 90 tablet 3   hydrocortisone  (ANUSOL -HC) 2.5 % rectal cream Place 1 Application rectally 2 (two) times daily. (Patient not taking: Reported on 09/22/2023) 30 g 1   Multiple Vitamin (MULTIVITAMIN) tablet Take 1 tablet by mouth 3 (three) times daily. (Patient not taking: Reported on 09/22/2023)     Vitamin D , Ergocalciferol , (DRISDOL ) 1.25 MG (50000 UNIT) CAPS capsule Take 1 capsule (50,000 Units total) by mouth every 7 (seven) days. 20 capsule 1   ondansetron  (ZOFRAN -ODT) 4 MG disintegrating tablet Take 1 tablet (4 mg total) by mouth every 6 (six) hours as needed for nausea or vomiting. 15 tablet 0   No facility-administered medications prior to visit.    Allergies  Allergen Reactions   Codeine Itching, Rash and Other (See Comments)    ROS Review of Systems    Objective:    Physical Exam HENT:     Head: Normocephalic.     Right Ear: External ear normal.     Left Ear: External ear normal.     Nose: No congestion or rhinorrhea.     Mouth/Throat:     Mouth: Mucous membranes are moist.  Cardiovascular:     Rate and Rhythm: Regular rhythm.     Heart sounds: No murmur heard. Pulmonary:     Effort: No  respiratory distress.     Breath sounds: Normal breath sounds.  Neurological:     Mental Status: He is alert.     BP 118/71   Pulse 79   Resp 16   Ht 5' 11 (1.803 m)   Wt 208 lb (94.3 kg)   SpO2 97%   BMI 29.01 kg/m  Wt Readings from Last 3 Encounters:  09/22/23 217 lb 12.8 oz (98.8 kg)  09/02/23 208 lb (94.3 kg)  09/01/23 205 lb 12.8 oz (93.4 kg)    Lab Results  Component Value Date   TSH 2.400 03/26/2023   Lab Results  Component Value Date   WBC 6.1 08/25/2023   HGB 14.0 08/25/2023   HCT 42.6 08/25/2023   MCV 90 08/25/2023   PLT 173 08/25/2023   Lab Results  Component Value Date   NA 140 07/10/2023   K 3.7 07/10/2023   CO2 17 (L) 07/10/2023   GLUCOSE 171 (H) 07/10/2023   BUN 16 07/10/2023   CREATININE 1.44 (H) 07/10/2023   BILITOT 1.3 (H) 07/10/2023   ALKPHOS 98 07/10/2023   AST 31 07/10/2023   ALT 18 07/10/2023   PROT 7.9 07/10/2023   ALBUMIN  4.7 07/10/2023   CALCIUM 10.1 07/10/2023   ANIONGAP 16 (H) 07/10/2023   EGFR 87 04/09/2023   Lab Results  Component Value Date   CHOL 166 03/26/2023   Lab Results  Component Value Date   HDL 65 03/26/2023   Lab Results  Component Value Date   LDLCALC 89 03/26/2023   Lab Results  Component Value Date   TRIG 61 03/26/2023   Lab Results  Component Value Date   CHOLHDL 2.6 03/26/2023   Lab Results  Component Value Date   HGBA1C 5.3 03/26/2023      Assessment & Plan:  Chronic post-traumatic stress disorder Assessment &  Plan: The [facility] However, due to a recent incident involving a positive urine drug screen for [facility], he states he will no longer continue care with the [place]. The patient explains that a previous counselor had prescribed THC use, but that provider discontinued therapy approximately one year ago. He is currently taking Duloxetine  60 mg daily. He reports that he was abruptly discontinued from Lorazepam  and Ambien , which has  contributed to worsening symptoms. His previous psychiatric medication history includes Lithium, Paxil, BuSpar, Depakote, Lexapro, Abilify, Seroquel, Clonazepam, Diazepam , Lurasidone, Oxcarbazepine, Prazosin. Today, the patient denies suicidal ideation or thoughts, but reports extremely high anxiety due to increased stressors and ongoing difficulty falling asleep. He states his [facility] counselor plans to initiate a trial of Trazodone , but he has not yet received a prescription. His next [place] follow-up is scheduled for September 15, 2023.  The patient was encouraged to contact the office by Monday if his counselor has not initiated Trazodone , at which point a prescription will be sent from our office if clinically appropriate. An urgent referral has been placed to [facility] in [place] for ongoing care and management of PTSD, generalized anxiety disorder, anxiety, depression, and insomnia. GeneSight pharmacogenomic testing was performed in the clinic today to help guide future psychiatric medication management.    Orders: -     Ambulatory referral to Psychiatry  Generalized anxiety disorder -     Ambulatory referral to Psychiatry  Other depression -     Ambulatory referral to Psychiatry  Note: This chart has been completed using Dragon Medical Dictation software, and while attempts have been made to ensure accuracy, certain words and phrases may not be transcribed as intended.    Follow-up: Return in about 1 month (around 10/02/2023).   Tamyra Fojtik, FNP

## 2023-09-02 NOTE — Assessment & Plan Note (Addendum)
 The patient reports [facility] for [facility]. However, due to a recent incident involving a positive urine drug screen for [facility], he states he will no longer continue care with the [place]. The patient explains that a previous counselor had prescribed THC use, but that provider discontinued therapy approximately one year ago. He is currently taking Duloxetine  60 mg daily. He reports that he was abruptly discontinued from Lorazepam  and Ambien , which has contributed to worsening symptoms. His previous psychiatric medication history includes Lithium, Paxil, BuSpar, Depakote, Lexapro, Abilify, Seroquel, Clonazepam, Diazepam , Lurasidone, Oxcarbazepine, Prazosin. Today, the patient denies suicidal ideation or thoughts, but reports extremely high anxiety due to increased stressors and ongoing difficulty falling asleep. He states his [facility] counselor plans to initiate a trial of Trazodone, but he has not yet received a prescription. His next [place] follow-up is scheduled for September 15, 2023.  The patient was encouraged to contact the office by Monday if his counselor has not initiated Trazodone, at which point a prescription will be sent from our office if clinically appropriate. An urgent referral has been placed to [facility] in [place] for ongoing care and management of PTSD, generalized anxiety disorder, anxiety, depression, and insomnia. GeneSight pharmacogenomic testing was performed in the clinic today to help guide future psychiatric medication management.

## 2023-09-06 ENCOUNTER — Other Ambulatory Visit: Payer: Self-pay | Admitting: Family Medicine

## 2023-09-06 ENCOUNTER — Other Ambulatory Visit: Payer: Self-pay

## 2023-09-06 ENCOUNTER — Telehealth: Payer: Self-pay | Admitting: Family Medicine

## 2023-09-06 ENCOUNTER — Encounter: Payer: Self-pay | Admitting: Family Medicine

## 2023-09-06 DIAGNOSIS — G4700 Insomnia, unspecified: Secondary | ICD-10-CM

## 2023-09-06 DIAGNOSIS — F419 Anxiety disorder, unspecified: Secondary | ICD-10-CM

## 2023-09-06 MED ORDER — LORAZEPAM 1 MG PO TABS: 1.0000 mg | ORAL_TABLET | Freq: Two times a day (BID) | ORAL | 0 refills | Status: DC | PRN

## 2023-09-06 MED ORDER — TRAZODONE HCL 50 MG PO TABS: 25.0000 mg | ORAL_TABLET | Freq: Every evening | ORAL | 1 refills | Status: DC | PRN

## 2023-09-06 MED ORDER — TRAZODONE HCL 50 MG PO TABS: 25.0000 mg | ORAL_TABLET | Freq: Every evening | ORAL | 3 refills | Status: DC | PRN

## 2023-09-06 MED ORDER — TRAZODONE HCL 50 MG PO TABS
25.0000 mg | ORAL_TABLET | Freq: Every evening | ORAL | 0 refills | Status: DC | PRN
Start: 2023-09-06 — End: 2023-09-10

## 2023-09-06 NOTE — Telephone Encounter (Signed)
 A prescription for Lorazepam  1 mg, to be taken twice daily as needed for anxiety, has been sent to the patient's pharmacy. This is intended for use in the interim while the patient is awaiting follow-up from the behavioral health referral.

## 2023-09-06 NOTE — Telephone Encounter (Signed)
 Patient made aware that Trazodone being sent to [facility]/ with a temp supply being sent to [facility].   He states he is very concerned because he recently stopped his anxiety meds cold [place] and he is wondering if you are going to start him on something for anxiety or if you want him to wait until his gene testing comes back. He states he has not heard anything about his [facility] referral. Wants a call back with the plan so he's aware and not having to stress over it

## 2023-09-10 ENCOUNTER — Other Ambulatory Visit: Payer: Self-pay | Admitting: Family Medicine

## 2023-09-10 DIAGNOSIS — F411 Generalized anxiety disorder: Secondary | ICD-10-CM

## 2023-09-10 DIAGNOSIS — F3289 Other specified depressive episodes: Secondary | ICD-10-CM

## 2023-09-10 DIAGNOSIS — G4700 Insomnia, unspecified: Secondary | ICD-10-CM

## 2023-09-10 MED ORDER — BUPROPION HCL ER (XL) 150 MG PO TB24: 150.0000 mg | ORAL_TABLET | Freq: Every day | ORAL | 1 refills | Status: DC

## 2023-09-10 MED ORDER — ZOLPIDEM TARTRATE 10 MG PO TABS: 10.0000 mg | ORAL_TABLET | Freq: Every evening | ORAL | 0 refills | Status: DC | PRN

## 2023-09-10 NOTE — Telephone Encounter (Signed)
 He is aware of the medication sent to [facility] and he received a letter from [facility] to call them within 5 business days if they hadn't called him to schedule but he was asking about the gene site testing results and when he could expect to start back on the long term depression medication because he isn't currently taking anything for that at the moment.

## 2023-09-10 NOTE — Progress Notes (Unsigned)
 I called and spoke with the patient to review his GeneSight testing results. Based on the results and his clinical presentation, we will initiate therapy with Wellbutrin 150 mg daily for depression.  A refill for mirtazapine 10 mg at bedtime has been sent to the pharmacy to help with insomnia, as the patient reports that Trazodone has been ineffective; therefore, it has been discontinued.  The patient is encouraged to continue taking Lorazepam  1 mg as needed for anxiety.  He is advised to follow up with Behavioral Health for ongoing psychiatric care and to follow up with me as needed.  The patient was also educated on the importance of seeking urgent medical care if experiencing symptoms of serotonin syndrome, such as confusion, agitation, rapid heart rate, high blood pressure, dilated pupils, muscle rigidity, or tremors.  The patient verbalized understanding.

## 2023-09-20 ENCOUNTER — Encounter: Payer: Self-pay | Admitting: Family Medicine

## 2023-09-22 ENCOUNTER — Ambulatory Visit: Admitting: Gastroenterology

## 2023-09-22 ENCOUNTER — Encounter: Payer: Self-pay | Admitting: Gastroenterology

## 2023-09-22 VITALS — BP 110/67 | HR 68 | Temp 97.6°F | Ht 71.0 in | Wt 217.8 lb

## 2023-09-22 DIAGNOSIS — K641 Second degree hemorrhoids: Secondary | ICD-10-CM

## 2023-09-22 DIAGNOSIS — K648 Other hemorrhoids: Secondary | ICD-10-CM

## 2023-09-22 NOTE — Progress Notes (Signed)
    [facility] BANDING PROCEDURE NOTE  William Farley is a 63 y.o. male presenting today for consideration of hemorrhoid banding. Last colonoscopy  May 2025 with non-bleeding internal hemorrhoids, diverticulosis, normal examined TI, random colonic biopsies unremarkable. We have banded the left lateral. We will band remaining columns today. He has had resolution of rectal bleeding. He has plans for an [place] cruise on July 17th.    The patient presents with symptomatic grade 2 hemorrhoids, unresponsive to maximal medical therapy, requesting rubber band ligation of his/her hemorrhoidal disease. All risks, benefits, and alternative forms of therapy were described and informed consent was obtained.   The decision was made to band the right posterior internal hemorrhoid, and the [facility] O'Regan System was used to perform band ligation without complication. Digital anorectal examination was then performed to assure proper positioning of the band, and to adjust the banded tissue as required. I then banded the right anterior internal hemorrhoid, and the [facility] O'Regan System was used to perform band ligation without complication. Digital anorectal examination was then performed to assure proper positioning of the band, and to adjust the banded tissue as required. The patient was discharged home without pain or other issues. Dietary and behavioral recommendations were given, along with follow-up instructions. The patient will return for routine follow-up in 2-3 months. I suspect he will not need neutral banding, but we can certainly do this in future if needed.   No complications were encountered and the patient tolerated the procedure well.   William MICAEL Stager, PhD, ANP-BC [facility]

## 2023-09-22 NOTE — Patient Instructions (Signed)
  Please avoid straining.  You should limit your toilet time to 2-3 minutes at the most.   I recommend Benefiber 2 teaspoons each morning in the beverage of your choice!  Please call me with any concerns or issues!  I will see you in  routine follow-up in 2-3 months!  Have a wonderful time in [place] !  I enjoyed seeing you again today! I value our relationship and want to provide genuine, compassionate, and quality care. You may receive a survey regarding your visit with me, and I welcome your feedback! Thanks so much for taking the time to complete this. I look forward to seeing you again.      Therisa MICAEL Stager, PhD, ANP-BC [facility]

## 2023-09-28 ENCOUNTER — Other Ambulatory Visit: Payer: Self-pay

## 2023-09-28 ENCOUNTER — Ambulatory Visit (INDEPENDENT_AMBULATORY_CARE_PROVIDER_SITE_OTHER)

## 2023-09-28 VITALS — BP 114/86 | HR 73 | Ht 71.0 in | Wt 211.0 lb

## 2023-09-28 DIAGNOSIS — F5104 Psychophysiologic insomnia: Secondary | ICD-10-CM

## 2023-09-28 DIAGNOSIS — F4312 Post-traumatic stress disorder, chronic: Secondary | ICD-10-CM

## 2023-09-28 MED ORDER — LORAZEPAM 1 MG PO TABS: 1.0000 mg | ORAL_TABLET | Freq: Three times a day (TID) | ORAL | 0 refills | Status: DC | PRN

## 2023-09-28 MED ORDER — SERTRALINE HCL 50 MG PO TABS: 50.0000 mg | ORAL_TABLET | Freq: Every day | ORAL | 3 refills | Status: DC

## 2023-09-28 MED ORDER — ZOLPIDEM TARTRATE ER 12.5 MG PO TBCR: 12.5000 mg | EXTENDED_RELEASE_TABLET | Freq: Every evening | ORAL | 0 refills | Status: DC | PRN

## 2023-09-28 NOTE — Assessment & Plan Note (Signed)
 Agree to refill lorazepam  and ambien  as previously prescribed by [place].

## 2023-09-28 NOTE — Progress Notes (Signed)
 Established Patient Office Visit  Subjective   Patient ID: William Farley, male    DOB: [birth year]  Age: 63 y.o. MRN: [id number]  Chief Complaint  Patient presents with   Medical Management of Chronic Issues    Pt states here for anxiety, medication is not working, pt states it feels like he is fighting for his life everyday, pt states not    HPI Depression, Follow-up  He  was last seen for this 1 months ago. Changes made at last visit include Wellbutrin  added.   He reports good compliance with treatment. He is not having side effects.   He reports good tolerance of treatment. Current symptoms include: depressed mood, difficulty concentrating, feelings of worthlessness/guilt, insomnia, and psychomotor agitation He feels he is Worse since last visit.     07/05/2023    8:41 AM 12/01/2022   10:37 AM 12/01/2022    9:59 AM  Depression screen PHQ 2/9  Decreased Interest 1 2 0  Down, Depressed, Hopeless 1 2 0  PHQ - 2 Score 2 4 0  Altered sleeping 1 3 0  Tired, decreased energy 1 1 0  Change in appetite 2 1 0  Feeling bad or failure about yourself  0 1 0  Trouble concentrating 0 0 0  Moving slowly or fidgety/restless 2 0 0  Suicidal thoughts 0 0 0  PHQ-9 Score 8 10 0  Difficult doing work/chores Somewhat difficult Somewhat difficult Not difficult at all    ----------------------------------------------------------------------------------------- Patient Active Problem List   Diagnosis Date Noted   Internal hemorrhoids 09/01/2023   Rectal bleeding 07/15/2023   PSA elevation 07/05/2023   Hypoglycemia 07/05/2023   Lumbar stenosis with neurogenic claudication 05/06/2023   Vitamin D  deficiency 01/26/2023   Essential (primary) hypertension 01/26/2023   Depression 01/26/2023   Anxiety 01/26/2023   Generalized anxiety disorder 09/01/2022   Fatigue 05/28/2022   Lower urinary tract symptoms (LUTS) 05/28/2022   Irritable bowel syndrome with diarrhea 05/28/2022   Cellulitis of  left knee 05/28/2022   Gastro-esophageal reflux disease without esophagitis 04/14/2022   Syncope and collapse 04/14/2022   Primary osteoarthritis of left knee 03/10/2022   Osteoarthritis of left knee 03/10/2022   Insomnia 02/18/2022   Nightmares associated with chronic post-traumatic stress disorder 02/18/2022   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   Chronic pain syndrome 02/18/2022   Chronic post-traumatic stress disorder 02/18/2022   Difficulty controlling anger 02/18/2022   Seizures (HCC) 03/07/2021   Thrombocytopenia (HCC) 03/07/2021   History of kidney stones 06/28/2014   Status post total knee replacement 09/29/2013      ROS    Objective:     BP 114/86 (BP Location: Left Arm, Patient Position: Sitting, Cuff Size: Normal)   Pulse 73   Ht 5' 11 (1.803 m)   Wt 211 lb 0.3 oz (95.7 kg)   SpO2 97%   BMI 29.43 kg/m  BP Readings from Last 3 Encounters:  09/28/23 114/86  09/22/23 110/67  09/02/23 118/71      Physical Exam Vitals and nursing note reviewed. Exam conducted with a chaperone present (wife Lonell).  Constitutional:      Appearance: Normal appearance.  HENT:     Head: Normocephalic.  Eyes:     Extraocular Movements: Extraocular movements intact.     Pupils: Pupils are equal, round, and reactive to light.  Cardiovascular:     Rate and Rhythm: Regular rhythm. Tachycardia present.  Pulmonary:     Effort: Pulmonary effort is normal.  Breath sounds: Normal breath sounds.  Musculoskeletal:     Cervical back: Normal range of motion and neck supple.  Skin:    General: Skin is moist.  Neurological:     Mental Status: He is alert and oriented to person, place, and time.  Psychiatric:        Attention and Perception: Attention and perception normal.        Mood and Affect: Mood is anxious. Affect is tearful.        Speech: Speech normal.        Behavior: Behavior normal.        Thought Content: Thought content normal. Thought content is not paranoid or  delusional. Thought content does not include homicidal or suicidal ideation. Thought content does not include homicidal or suicidal plan.        Cognition and Memory: Cognition normal.      No results found for any visits on 09/28/23.    The 10-year ASCVD risk score (Arnett DK, et al., 2019) is: 6.6%    Assessment & Plan:   Problem List Items Addressed This Visit       Other   Insomnia   Agree to refill lorazepam  and ambien  as previously prescribed by [facility].        Relevant Medications   LORazepam  (ATIVAN ) 1 MG tablet   Chronic post-traumatic stress disorder - Primary   The patient [facility]. However, due to a recent incident involving a positive urine drug screen for [facility], he states he will no longer continue care with the [place]. The patient explains that a previous counselor had prescribed THC use, but that provider discontinued therapy approximately one year ago. - He was started on Wellbutrin  at last office visit a month ago, but report little to no improvement in his symptoms with addition of this.   - He reports that he was abruptly discontinued from Lorazepam  and Ambien , which has contributed to worsening symptoms of anxiety and insomnia.   - His previous psychiatric medication history includes Lithium, Paxil, BuSpar, Depakote, Lexapro, Abilify, Seroquel, Clonazepam, Diazepam , Lurasidone, Oxcarbazepine, Prazosin. -Today, the patient denies suicidal ideation or thoughts, but reports extremely high anxiety due to increased stressors and ongoing difficulty falling asleep.  -He has an appt with [facility] here in [place] on 10/25/23, and psychiatry appt in November.  He has decided not to return to the [place] d/t the conflicting recommendations that he received about medical marijuana use and then the abrupt discontinuation of all his medications when drug screen came back positive for this.  This has left him very anxious and irritated with the  [facility] system.   I have agreed to refill his lorazepam  and ambien  as previously prescribed since he was on this regimen for many years.   We also agreed to addition of sertraline  for ongoing anxiety and depression.   He denies any suicidal ideation at this time.  His wife Lonell is with him today.   Advised to f/u in 4-6 weeks or to reach out sooner if needed.          Relevant Medications   LORazepam  (ATIVAN ) 1 MG tablet   sertraline  (ZOLOFT ) 50 MG tablet    Return in about 6 weeks (around 11/09/2023).    Leita Longs, FNP

## 2023-09-28 NOTE — Assessment & Plan Note (Signed)
 The [facility]. However, due to a recent incident involving a positive urine drug screen for [facility], he states he will no longer continue care with the [facility]. The patient explains that a previous counselor had prescribed THC use, but that provider discontinued therapy approximately one year ago. - He was started on Wellbutrin  at last office visit a month ago, but report little to no improvement in his symptoms with addition of this.   - He reports that he was abruptly discontinued from Lorazepam  and Ambien , which has contributed to worsening symptoms of anxiety and insomnia.   - His previous psychiatric medication history includes Lithium, Paxil, BuSpar, Depakote, Lexapro, Abilify, Seroquel, Clonazepam, Diazepam , Lurasidone, Oxcarbazepine, Prazosin. -Today, the patient denies suicidal ideation or thoughts, but reports extremely high anxiety due to increased stressors and ongoing difficulty falling asleep.  -He has an appt with BH here in [place] on 10/25/23, and psychiatry appt in November.  He has decided not to return to the [facility] d/t the conflicting recommendations that he received about medical marijuana use and then the abrupt discontinuation of all his medications when drug screen came back positive for this.  This has left him very anxious and irritated with the [facility].   I have agreed to refill his lorazepam  and ambien  as previously prescribed since he was on this regimen for many years.   We also agreed to addition of sertraline  for ongoing anxiety and depression.   He denies any suicidal ideation at this time.  His wife Lonell is with him today.   Advised to f/u in 4-6 weeks or to reach out sooner if needed.

## 2023-09-29 ENCOUNTER — Encounter: Payer: Self-pay | Admitting: Family Medicine

## 2023-09-29 ENCOUNTER — Ambulatory Visit: Admitting: Gastroenterology

## 2023-10-07 ENCOUNTER — Other Ambulatory Visit: Payer: Self-pay

## 2023-10-13 ENCOUNTER — Ambulatory Visit: Payer: Federal, State, Local not specified - PPO | Admitting: Urology

## 2023-10-24 ENCOUNTER — Other Ambulatory Visit: Payer: Self-pay

## 2023-10-24 DIAGNOSIS — F5104 Psychophysiologic insomnia: Secondary | ICD-10-CM

## 2023-10-24 DIAGNOSIS — F4312 Post-traumatic stress disorder, chronic: Secondary | ICD-10-CM

## 2023-10-25 ENCOUNTER — Ambulatory Visit (INDEPENDENT_AMBULATORY_CARE_PROVIDER_SITE_OTHER): Admitting: Clinical

## 2023-10-25 DIAGNOSIS — F331 Major depressive disorder, recurrent, moderate: Secondary | ICD-10-CM

## 2023-10-25 DIAGNOSIS — F4310 Post-traumatic stress disorder, unspecified: Secondary | ICD-10-CM | POA: Diagnosis not present

## 2023-10-25 DIAGNOSIS — F419 Anxiety disorder, unspecified: Secondary | ICD-10-CM | POA: Diagnosis not present
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
## 2023-11-02 ENCOUNTER — Other Ambulatory Visit: Payer: Self-pay | Admitting: Nurse Practitioner

## 2023-11-04 ENCOUNTER — Ambulatory Visit: Admitting: Family Medicine

## 2023-11-08 ENCOUNTER — Ambulatory Visit (HOSPITAL_COMMUNITY)
Admission: RE | Admit: 2023-11-08 | Discharge: 2023-11-08 | Disposition: A | Discharge status: Home / Self Care | Source: Ambulatory Visit | Attending: Urology | Admitting: Urology

## 2023-11-08 DIAGNOSIS — N200 Calculus of kidney: Secondary | ICD-10-CM | POA: Diagnosis not present

## 2023-11-08 DIAGNOSIS — Z87442 Personal history of urinary calculi: Secondary | ICD-10-CM | POA: Diagnosis not present

## 2023-11-08 DIAGNOSIS — I878 Other specified disorders of veins: Secondary | ICD-10-CM | POA: Diagnosis not present

## 2023-11-08 DIAGNOSIS — K219 Gastro-esophageal reflux disease without esophagitis: Secondary | ICD-10-CM | POA: Diagnosis not present

## 2023-11-10 ENCOUNTER — Ambulatory Visit: Admitting: Urology

## 2023-11-10 ENCOUNTER — Encounter: Payer: Self-pay | Admitting: Urology

## 2023-11-10 VITALS — BP 104/66 | HR 55

## 2023-11-10 DIAGNOSIS — N401 Enlarged prostate with lower urinary tract symptoms: Secondary | ICD-10-CM | POA: Diagnosis not present

## 2023-11-10 DIAGNOSIS — R3912 Poor urinary stream: Secondary | ICD-10-CM

## 2023-11-10 DIAGNOSIS — R972 Elevated prostate specific antigen [PSA]: Secondary | ICD-10-CM | POA: Diagnosis not present

## 2023-11-10 DIAGNOSIS — N200 Calculus of kidney: Secondary | ICD-10-CM

## 2023-11-10 LAB — URINALYSIS, ROUTINE W REFLEX MICROSCOPIC
Bilirubin, UA: NEGATIVE
Glucose, UA: NEGATIVE
Ketones, UA: NEGATIVE
Leukocytes,UA: NEGATIVE
Nitrite, UA: NEGATIVE
Protein,UA: NEGATIVE
RBC, UA: NEGATIVE
Specific Gravity, UA: 1.03 (ref 1.005–1.030)
Urobilinogen, Ur: 0.2 mg/dL (ref 0.2–1.0)
pH, UA: 5.5 (ref 5.0–7.5)

## 2023-11-10 MED ORDER — TAMSULOSIN HCL 0.4 MG PO CAPS: 0.4000 mg | ORAL_CAPSULE | Freq: Two times a day (BID) | ORAL | 3 refills | Status: AC

## 2023-11-10 MED ORDER — ALLOPURINOL 300 MG PO TABS: 300.0000 mg | ORAL_TABLET | Freq: Every day | ORAL | 3 refills | Status: AC

## 2023-11-10 MED ORDER — TAMSULOSIN HCL 0.4 MG PO CAPS: 0.4000 mg | ORAL_CAPSULE | Freq: Two times a day (BID) | ORAL | 11 refills | Status: DC

## 2023-11-10 MED ORDER — TAMSULOSIN HCL 0.4 MG PO CAPS: 0.4000 mg | ORAL_CAPSULE | Freq: Every day | ORAL | 11 refills | Status: DC

## 2023-11-10 NOTE — Addendum Note (Signed)
 Addended by: Vila Dory L on: 11/10/2023 08:35 AM   Modules accepted: Orders

## 2023-11-10 NOTE — Progress Notes (Signed)
 11/10/2023 8:22 AM   William Farley 09-28-1960 [id number]  Referring provider: Zarwolo, Gloria, FNP [address] [place],  [place] [address]  nephrolithiasis   HPI: Mr Bessinger is a 63yo here for followup for elevated PSA, nephrolithiasis and BPH with weak stream. KUB 8/18 shows no calculi. He had a CT in 07/2023 which showed no calculi. No flank pain. He remains on allopurinol . IPSS 16 QOl 2 on flomax  0.4mg  daily. He has an intermittent weak stream. No recent PSA. IsoPSA could not be performed due to low PSA.    PMH: Past Medical History:  Diagnosis Date   Anxiety    Arthritis    CAD (coronary artery disease)    Minimal Non-obstructive CAD seen on coronary CTA 04/2021   Depression    GERD (gastroesophageal reflux disease)    History of kidney stones    History of thrombocytopenia    Hx of syncope    Hypoglycemia    PAF (paroxysmal atrial fibrillation) (HCC)    ChadsVasc Score is 0.  Monitor revealed less than 1% PAF burden.   Seizure (HCC) 02/2021   Sleep apnea    wears CPAP     Surgical History: Past Surgical History:  Procedure Laterality Date   APPENDECTOMY     BIOPSY  05/01/2021   Procedure: BIOPSY;  Surgeon: [name], MD;  [facility]: [facility];  Service: Endoscopy;;   CERVICAL SPINE SURGERY  2018   CHOLECYSTECTOMY     COLONOSCOPY N/A 07/23/2023   Procedure: COLONOSCOPY;  Surgeon: [name], DO;  [facility]: [facility];  Service: Endoscopy;  Laterality: N/A;  8:30am;asa 2 ileocolonoscopy   COLONOSCOPY WITH PROPOFOL  N/A 05/01/2021   Procedure: COLONOSCOPY WITH PROPOFOL ;  Surgeon: [name], MD;  [facility]: [facility];  Service: Endoscopy;  Laterality: N/A;  1050   CYST EXCISION     CYSTOSCOPY     CYSTOSCOPY WITH RETROGRADE PYELOGRAM, URETEROSCOPY AND STENT PLACEMENT Right 08/08/2020   Procedure: CYSTOSCOPY WITH RIGHT RETROGRADE PYELOGRAM AND RIGHT URETEROSCOPY;  Surgeon: [name], MD;  Location: [facility];  Service: Urology;   Laterality: Right;   ESOPHAGOGASTRODUODENOSCOPY N/A 07/23/2023   Procedure: EGD (ESOPHAGOGASTRODUODENOSCOPY);  Surgeon: [name], DO;  [facility];  Service: Endoscopy;  Laterality: N/A;  8:30am;asa 2   EXTRACORPOREAL SHOCK WAVE LITHOTRIPSY Right 05/21/2020   Procedure: EXTRACORPOREAL SHOCK WAVE LITHOTRIPSY (ESWL);  Surgeon: [name], MD;  Location: [facility];  Service: Urology;  Laterality: Right;   GASTRIC BYPASS OPEN  2003   INCISIONAL HERNIA REPAIR     kidney stone removal     KNEE ARTHROSCOPY Left    multiple   NECK SURGERY     POLYPECTOMY  05/01/2021   Procedure: POLYPECTOMY;  Surgeon: [name], MD;  [facility];  Service: Endoscopy;;   POSTERIOR FUSION PEDICLE SCREW PLACEMENT N/A 05/06/2023   Procedure: POSTERIOR LUMBAR INTERBODY FUSION LUMBAR FOUR-FIVE POSTERIOR LATERAL AND INTERBODY FUSION;  Surgeon: [name], MD;  Location: [facility];  Service: Neurosurgery;  Laterality: N/A;   REPLACEMENT TOTAL KNEE Right    RHINOPLASTY     STONE EXTRACTION WITH BASKET Right 08/08/2020   Procedure: STONE EXTRACTION WITH BASKET;  Surgeon: [name], MD;  Location: [facility];  Service: Urology;  Laterality: Right;   TOTAL KNEE ARTHROPLASTY Left 03/10/2022   Procedure: TOTAL KNEE ARTHROPLASTY;  Surgeon: [name], MD;  Location: [facility];  Service: Orthopedics;  Laterality: Left;  Home Medications:  Allergies as of 11/10/2023       Reactions   Codeine Itching, Rash, Other (See Comments)        Medication List        Accurate as of November 10, 2023  8:22 AM. If you have any questions, ask your nurse or doctor.          allopurinol  300 MG tablet Commonly known as: ZYLOPRIM  Take 1 tablet (300 mg total) by mouth daily.   LORazepam  1 MG tablet Commonly known as: ATIVAN  TAKE 1 TABLET(1 MG) BY MOUTH EVERY 8 HOURS AS NEEDED FOR ANXIETY   metoprolol  succinate 25 MG 24 hr tablet Commonly known as: TOPROL -XL Take 0.5 tablets  (12.5 mg total) by mouth daily.   omeprazole 20 MG capsule Commonly known as: PRILOSEC Take 20 mg by mouth in the morning.   sertraline  50 MG tablet Commonly known as: ZOLOFT  Take 1 tablet (50 mg total) by mouth daily.   tamsulosin  0.4 MG Caps capsule Commonly known as: FLOMAX  Take 1 capsule (0.4 mg total) by mouth daily after supper.   Vitamin D  (Ergocalciferol ) 1.25 MG (50000 UNIT) Caps capsule Commonly known as: DRISDOL  Take 1 capsule (50,000 Units total) by mouth every 7 (seven) days.   zolpidem  12.5 MG CR tablet Commonly known as: AMBIEN  CR TAKE 1 TABLET(12.5 MG) BY MOUTH AT BEDTIME AS NEEDED FOR SLEEP        Allergies:  Allergies  Allergen Reactions   Codeine Itching, Rash and Other (See Comments)    Family History: Family History  Problem Relation Age of Onset   Heart disease Father 72   Kidney failure Father    Colon cancer Neg Hx     Social History:  reports that he quit smoking about 27 years ago. His smoking use included cigarettes. He started smoking about 37 years ago. He has a 5 pack-year smoking history. He has been exposed to tobacco smoke. He has never used smokeless tobacco. He reports that he does not currently use alcohol. He reports current drug use. Drug: Marijuana.  ROS: All other review of systems were reviewed and are negative except what is noted above in HPI  Physical Exam: BP 104/66   Pulse (!) 55   Constitutional:  Alert and oriented, No acute distress. HEENT: [place] AT, moist mucus membranes.  Trachea midline, no masses. Cardiovascular: No clubbing, cyanosis, or edema. Respiratory: Normal respiratory effort, no increased work of breathing. GI: Abdomen is soft, nontender, nondistended, no abdominal masses GU: No CVA tenderness.  Lymph: No cervical or inguinal lymphadenopathy. Skin: No rashes, bruises or suspicious lesions. Neurologic: Grossly intact, no focal deficits, moving all 4 extremities. Psychiatric: Normal mood and  affect.  Laboratory Data: Lab Results  Component Value Date   WBC 6.1 08/25/2023   HGB 14.0 08/25/2023   HCT 42.6 08/25/2023   MCV 90 08/25/2023   PLT 173 08/25/2023    Lab Results  Component Value Date   CREATININE 1.44 (H) 07/10/2023    No results found for: PSA  No results found for: TESTOSTERONE  Lab Results  Component Value Date   HGBA1C 5.3 03/26/2023    Urinalysis    Component Value Date/Time   APPEARANCEUR Clear 06/30/2023 1508   GLUCOSEU Negative 06/30/2023 1508   BILIRUBINUR Negative 06/30/2023 1508   PROTEINUR Negative 06/30/2023 1508   NITRITE Negative 06/30/2023 1508   LEUKOCYTESUR Negative 06/30/2023 1508    Lab Results  Component Value Date   LABMICR Comment 06/30/2023  WBCUA None seen 06/26/2020   LABEPIT 0-10 06/26/2020   BACTERIA None seen 06/26/2020    Pertinent Imaging: KUb 11/08/2023: Images reviewed and discussed with the patient  Results for orders placed in visit on 07/08/22  DG Abd 1 View  Narrative CLINICAL DATA:  Kidney stones, lithotripsy RIGHT 6 months ago, has passed 5 stones since  EXAM: ABDOMEN - 1 VIEW  COMPARISON:  06/10/2021  FINDINGS: Tiny calculus inferior pole LEFT kidney.  Question 7 mm calculus upper pole LEFT kidney.  No additional urinary tract calcifications.  Bowel gas pattern normal.  Osseous structures unremarkable.  IMPRESSION: Potentially 2 LEFT renal calculi.   Electronically Signed By: Oneil Kiss M.D. On: 07/11/2022 11:44  No results found for this or any previous visit.  No results found for this or any previous visit.  No results found for this or any previous visit.  Results for orders placed during the hospital encounter of 12/15/22  Ultrasound renal complete  Narrative CLINICAL DATA:  Nephrolithiasis  EXAM: RENAL / URINARY TRACT ULTRASOUND COMPLETE  COMPARISON:  10/11/2020.  FINDINGS: Right Kidney:  Length: 11.1 cm. Echogenicity within normal limits. No mass  or hydronephrosis visualized. Shadowing lower pole stone identified that measures at least 6 mm.  Left Kidney:  Length: 13.0 cm. Echogenicity within normal limits. No mass or hydronephrosis visualized. No shadowing stones are demonstrated.  Bladder:  Appears normal for degree of bladder distention.  Splenomegaly incidentally noted, 13.9 cm.  IMPRESSION: 1. Right-sided nephrolithiasis. 2. No hydronephrosis. 3. Splenomegaly incidentally noted.   Electronically Signed By: Fonda Field M.D. On: 01/02/2023 17:52  No results found for this or any previous visit.  No results found for this or any previous visit.  Results for orders placed in visit on 11/19/20  CT RENAL STONE STUDY  Narrative CLINICAL DATA:  Two weeks of intermittent sharp severe nonradiating right flank pain.  EXAM: CT ABDOMEN AND PELVIS WITHOUT CONTRAST  TECHNIQUE: Multidetector CT imaging of the abdomen and pelvis was performed following the standard protocol without IV contrast.  COMPARISON:  CT February 29, 2020  FINDINGS: Lower chest: No acute abnormality.  Hepatobiliary: Unremarkable noncontrast appearance of the hepatic parenchyma. Gallbladder surgically absent. No biliary ductal dilation.  Pancreas: Within normal limits.  Spleen: Within normal limits.  Adrenals/Urinary Tract: Bilateral adrenal glands are unremarkable. No hydronephrosis. Left kidney is unremarkable. Punctate nonobstructive right lower pole renal stone. No obstructive ureteral or bladder calculus visualized. Urinary bladder is decompressed limiting evaluation.  Stomach/Bowel: Prior gastric bypass. No pathologic dilation of small or large bowel. Appendix is surgically absent.  Vascular/Lymphatic: Aortic atherosclerosis without abdominal aortic aneurysm. No pathologically enlarged abdominal or pelvic lymph nodes.  Reproductive: Prostate is unremarkable.  Other: Fat in bilateral inguinal canals.  No  abdominopelvic ascites.  Musculoskeletal: Mild thoracolumbar spondylosis. No acute osseous abnormality.  IMPRESSION: 1. No acute findings in the abdomen or pelvis. 2. Punctate nonobstructive right lower pole renal stone. No obstructive ureteral or bladder calculus visualized. 3. Prior gastric bypass and cholecystectomy. 4.  Aortic Atherosclerosis (ICD10-I70.0).   Electronically Signed By: Reyes Holder M.D. On: 11/20/2020 11:11   Assessment & Plan:    1. Nephrolithiasis (Primary) -followup 6 months with KUB -continue allopuriol 300mg  daily - Urinalysis, Routine w reflex microscopic  2. Elevated PSA -PSa today, if normal followup 6 months with PSA - PSA, total and free  3. BPH with Weak stream -increase flomax  to 0.4mg  BID   No follow-ups on file.  Belvie Clara, MD  [facility]  [facility] [place]

## 2023-11-10 NOTE — Patient Instructions (Signed)

## 2023-11-11 LAB — PSA, TOTAL AND FREE
PSA, Free Pct: 34.3 %
PSA, Free: 0.24 ng/mL
Prostate Specific Ag, Serum: 0.7 ng/mL (ref 0.0–4.0)

## 2023-11-12 ENCOUNTER — Encounter: Payer: Self-pay | Admitting: Radiology

## 2023-11-12 ENCOUNTER — Ambulatory Visit (INDEPENDENT_AMBULATORY_CARE_PROVIDER_SITE_OTHER): Admitting: Nurse Practitioner

## 2023-11-12 ENCOUNTER — Encounter: Payer: Self-pay | Admitting: Nurse Practitioner

## 2023-11-12 VITALS — BP 108/60 | HR 53 | Ht 71.0 in | Wt 217.5 lb

## 2023-11-12 DIAGNOSIS — E162 Hypoglycemia, unspecified: Secondary | ICD-10-CM

## 2023-11-12 NOTE — Progress Notes (Signed)
 Endocrinology Follow Up Note                                            11/12/2023, 8:12 AM   Subjective:    Patient ID: William Farley, male    DOB: [birth year], PCP Bevely Doffing, FNP   Past Medical History:  Diagnosis Date   Anxiety    Arthritis    CAD (coronary artery disease)    Minimal Non-obstructive CAD seen on coronary CTA 04/2021   Depression    GERD (gastroesophageal reflux disease)    History of kidney stones    History of thrombocytopenia    Hx of syncope    Hypoglycemia    PAF (paroxysmal atrial fibrillation) (HCC)    ChadsVasc Score is 0.  Monitor revealed less than 1% PAF burden.   Seizure (HCC) 02/2021   Sleep apnea    wears CPAP    Past Surgical History:  Procedure Laterality Date   APPENDECTOMY     BIOPSY  05/01/2021   Procedure: BIOPSY;  Surgeon: [name], MD;  [facility];  Service: Endoscopy;;   CERVICAL SPINE SURGERY  2018   CHOLECYSTECTOMY     COLONOSCOPY N/A 07/23/2023   Procedure: COLONOSCOPY;  Surgeon: [name], DO;  [facility];  Service: Endoscopy;  Laterality: N/A;  8:30am;asa 2 ileocolonoscopy   COLONOSCOPY WITH PROPOFOL  N/A 05/01/2021   Procedure: COLONOSCOPY WITH PROPOFOL ;  Surgeon: [name], MD;  [facility];  Service: Endoscopy;  Laterality: N/A;  1050   CYST EXCISION     CYSTOSCOPY     CYSTOSCOPY WITH RETROGRADE PYELOGRAM, URETEROSCOPY AND STENT PLACEMENT Right 08/08/2020   Procedure: CYSTOSCOPY WITH RIGHT RETROGRADE PYELOGRAM AND RIGHT URETEROSCOPY;  Surgeon: [name], MD;  Location: [facility];  Service: Urology;  Laterality: Right;   ESOPHAGOGASTRODUODENOSCOPY N/A 07/23/2023   Procedure: EGD (ESOPHAGOGASTRODUODENOSCOPY);  Surgeon: [name], DO;  [facility];  Service: Endoscopy;  Laterality: N/A;  8:30am;asa 2   EXTRACORPOREAL SHOCK WAVE LITHOTRIPSY Right 05/21/2020   Procedure: EXTRACORPOREAL SHOCK WAVE LITHOTRIPSY (ESWL);  Surgeon: [name], MD;  Location: [facility];  Service: Urology;  Laterality: Right;   GASTRIC BYPASS OPEN  2003   INCISIONAL HERNIA REPAIR     kidney stone removal     KNEE ARTHROSCOPY Left    multiple   NECK SURGERY     POLYPECTOMY  05/01/2021   Procedure: POLYPECTOMY;  Surgeon: [name], MD;  [facility];  Service: Endoscopy;;   POSTERIOR FUSION PEDICLE SCREW PLACEMENT N/A 05/06/2023   Procedure: POSTERIOR LUMBAR INTERBODY FUSION LUMBAR FOUR-FIVE POSTERIOR LATERAL AND INTERBODY FUSION;  Surgeon: [name], MD;  Location: [facility];  Service: Neurosurgery;  Laterality: N/A;   REPLACEMENT TOTAL KNEE Right    RHINOPLASTY     STONE EXTRACTION WITH BASKET Right 08/08/2020   Procedure: STONE EXTRACTION WITH BASKET;  Surgeon: [name], MD;  Location: [facility];  Service: Urology;  Laterality: Right;   TOTAL KNEE ARTHROPLASTY Left 03/10/2022   Procedure: TOTAL KNEE ARTHROPLASTY;  Surgeon: [name], MD;  Location: [facility];  Service: Orthopedics;  Laterality: Left;   Social History   Socioeconomic History   Marital status: Married    Spouse name: Not on file   Number of children: Not  on file   Years of education: Not on file   Highest education level: Associate degree: occupational, Scientist, product/process development, or vocational program  Occupational History   Occupation: Economist  Tobacco Use   Smoking status: Former    Current packs/day: 0.00    Average packs/day: 0.5 packs/day for 10.0 years (5.0 ttl pk-yrs)    Types: Cigarettes    Start date: 05/20/1986    Quit date: 05/20/1996    Years since quitting: 27.4    Passive exposure: Past   Smokeless tobacco: Never  Vaping Use   Vaping status: Never Used  Substance and Sexual Activity   Alcohol use: Not Currently   Drug use: Yes    Types: Marijuana    Comment: daily   Sexual activity: Yes  Other Topics Concern   Not on file  Social History Narrative   Right handed    Married wife   One story home   Social  Drivers of Health   Financial Resource Strain: Low Risk  (07/04/2023)   Overall Financial Resource Strain (CARDIA)    Difficulty of Paying Living Expenses: Not very hard  Food Insecurity: No Food Insecurity (07/04/2023)   Hunger Vital Sign    Worried About Running Out of Food in the Last Year: Never true    Ran Out of Food in the Last Year: Never true  Transportation Needs: Unmet Transportation Needs (07/04/2023)   PRAPARE - Transportation    Lack of Transportation (Medical): Yes    Lack of Transportation (Non-Medical): Yes  Physical Activity: Unknown (07/04/2023)   Exercise Vital Sign    Days of Exercise per Week: 0 days    Minutes of Exercise per Session: Not on file  Recent Concern: Physical Activity - Inactive (07/04/2023)   Exercise Vital Sign    Days of Exercise per Week: 0 days    Minutes of Exercise per Session: 10 min  Stress: Stress Concern Present (07/04/2023)   [employer] of Occupational Health - Occupational Stress Questionnaire    Feeling of Stress : Very much  Social Connections: Moderately Integrated (07/04/2023)   Social Connection and Isolation Panel    Frequency of Communication with Friends and Family: Never    Frequency of Social Gatherings with Friends and Family: Never    Attends Religious Services: More than 4 times per year    Active Member of [employer] or Organizations: Yes    Attends Engineer, structural: More than 4 times per year    Marital Status: Married   Family History  Problem Relation Age of Onset   Heart disease Father 68   Kidney failure Father    Colon cancer Neg Hx    Outpatient Encounter Medications as of 11/12/2023  Medication Sig   allopurinol  (ZYLOPRIM ) 300 MG tablet Take 1 tablet (300 mg total) by mouth daily.   LORazepam  (ATIVAN ) 1 MG tablet TAKE 1 TABLET(1 MG) BY MOUTH EVERY 8 HOURS AS NEEDED FOR ANXIETY   metoprolol  succinate (TOPROL -XL) 25 MG 24 hr tablet Take 0.5 tablets (12.5 mg total) by mouth daily.   omeprazole  (PRILOSEC) 20 MG capsule Take 20 mg by mouth in the morning.   sertraline  (ZOLOFT ) 50 MG tablet Take 1 tablet (50 mg total) by mouth daily.   tamsulosin  (FLOMAX ) 0.4 MG CAPS capsule Take 1 capsule (0.4 mg total) by mouth in the morning and at bedtime.   Vitamin D , Ergocalciferol , (DRISDOL ) 1.25 MG (50000 UNIT) CAPS capsule Take 1 capsule (50,000 Units total) by mouth every  7 (seven) days.   zolpidem  (AMBIEN  CR) 12.5 MG CR tablet TAKE 1 TABLET(12.5 MG) BY MOUTH AT BEDTIME AS NEEDED FOR SLEEP   No facility-administered encounter medications on file as of 11/12/2023.   ALLERGIES: Allergies  Allergen Reactions   Codeine Itching, Rash and Other (See Comments)    VACCINATION STATUS: Immunization History  Administered Date(s) Administered   DTaP 08/18/2014   Influenza Inj Mdck Quad Pf 01/02/2016, 06/14/2017, 03/07/2021   Influenza, Seasonal, Injecte, Preservative Fre 12/01/2022   Influenza-Unspecified 12/24/2014, 01/22/2019, 01/15/2020, 02/21/2020, 03/23/2021, 11/21/2021, 01/28/2022   Janssen (J&J) SARS-COV-2 Vaccination 05/29/2019   Moderna Covid-19 Vaccine Bivalent Booster 36yrs & up 02/21/2020, 01/27/2021   Moderna Sars-Covid-2 Vaccination 01/15/2020   Pneumococcal Polysaccharide-23 10/02/2014   Pneumococcal-Unspecified 10/02/2014   Tdap 10/02/2014, 01/22/2020, 12/04/2021   Zoster Recombinant(Shingrix) 03/23/2021, 05/05/2021    HPI MONTEY EBEL is 63 y.o. male who presents today with a medical history as above. he is being seen in follow up after being seen in consultation for hypoglycemia requested by Bevely Doffing, FNP.  he has been dealing with symptoms of hypoglycemia for approximately 6 months.  He notes episodes typically last about 20-30 minutes and self-corrects with time.  There is pattern to his low glucose levels.  He was given a glucometer by the [facility] and has checked fasting levels as well as times he had symptoms of low glucose.  Fasting glucose ranges between the 90-100  range.  His hypoglycemic events have been 40-60 range.  Per the referral, it states he does not have much of an appetite due to severe back pain, so it is possible that his hypoglycemia is related to malnutrition.    He has history of IBS, also history of gastric bypass surgery more than 10 years ago.  He drinks Coke Zero drinks or SF grape flavoring for his water .  He does not have a routine pattern with eating but admits he is a picky eater.   He also has a history of heavy alcohol abuse (quit over 8 years ago)- denies any hx of pancreatitis.  He does admit to using THC to help calm him.  Review of systems  Constitutional: +stable body weight,  current Body mass index is 30.34 kg/m. , no fatigue, no subjective hyperthermia, no subjective hypothermia Eyes: no blurry vision, no xerophthalmia ENT: no sore throat, no nodules palpated in throat, no dysphagia/odynophagia, no hoarseness Cardiovascular: no chest pain, no shortness of breath, no palpitations, no leg swelling Respiratory: no cough, no shortness of breath Gastrointestinal: + intermittent nausea Musculoskeletal: + back pain-severe (sees pain specialist) Skin: no rashes, no hyperemia Neurological: no tremors, no numbness, no tingling, no dizziness Psychiatric: hx of PTSD and depression currently controlled  Objective:    BP Readings from Last 3 Encounters:  11/12/23 108/60  11/10/23 104/66  09/28/23 114/86    BP 108/60 (BP Location: Left Arm, Patient Position: Sitting, Cuff Size: Large)   Pulse (!) 53   Ht 5' 11 (1.803 m)   Wt 217 lb 8 oz (98.7 kg)   BMI 30.34 kg/m   Wt Readings from Last 3 Encounters:  11/12/23 217 lb 8 oz (98.7 kg)  09/28/23 211 lb 0.3 oz (95.7 kg)  09/22/23 217 lb 12.8 oz (98.8 kg)      Physical Exam- Limited  Constitutional:  Body mass index is 30.34 kg/m. , not in acute distress, normal state of mind Eyes:  EOMI, no exophthalmos Musculoskeletal: no gross deformities, strength intact in all  four extremities, no  gross restriction of joint movements Skin:  no rashes, no hyperemia Neurological: no tremor with outstretched hands  CMP ( most recent) CMP     Component Value Date/Time   NA 140 07/10/2023 2135   NA 142 04/09/2023 1010   K 3.7 07/10/2023 2135   CL 107 07/10/2023 2135   CO2 17 (L) 07/10/2023 2135   GLUCOSE 171 (H) 07/10/2023 2135   BUN 16 07/10/2023 2135   BUN 10 04/09/2023 1010   CREATININE 1.44 (H) 07/10/2023 2135   CALCIUM 10.1 07/10/2023 2135   PROT 7.9 07/10/2023 2135   PROT 7.0 04/09/2023 1010   ALBUMIN  4.7 07/10/2023 2135   ALBUMIN  4.6 04/09/2023 1010   AST 31 07/10/2023 2135   ALT 18 07/10/2023 2135   ALKPHOS 98 07/10/2023 2135   BILITOT 1.3 (H) 07/10/2023 2135   BILITOT 0.6 04/09/2023 1010   EGFR 87 04/09/2023 1010   GFRNONAA 56 (L) 07/10/2023 2135     Diabetic Labs (most recent): Lab Results  Component Value Date   HGBA1C 5.3 03/26/2023   HGBA1C 5.3 09/01/2022   HGBA1C 5.1 05/26/2022     Lipid Panel ( most recent) Lipid Panel     Component Value Date/Time   CHOL 166 03/26/2023 0926   TRIG 61 03/26/2023 0926   HDL 65 03/26/2023 0926   CHOLHDL 2.6 03/26/2023 0926   LDLCALC 89 03/26/2023 0926   LABVLDL 12 03/26/2023 0926      Lab Results  Component Value Date   TSH 2.400 03/26/2023   TSH 3.190 09/01/2022   TSH 2.110 05/26/2022   FREET4 1.10 03/26/2023   FREET4 1.09 05/26/2022           Assessment & Plan:   1. Hypoglycemia (Primary)  - William Farley  is being seen at a kind request of Bevely Doffing, FNP.  - I have reviewed his available records and clinically evaluated the patient.  - Based on these reviews, he has reactive hypoglycemia related to inadequate oral intake vs poor quality foods.  I also reviewed CT of abdomen done 02/2020 which shows normal pancreas, no tumors noted, ruling out insulinoma.  His insulin  and c-peptide levels were normal which indicates his pancreas is making the proper amount of  insulin  his body needs.  His HOMA IR score was 2.3 indicating early insulin  resistance, he does note strong family history of type 2 diabetes.  He was prescribed a CGM for severe hypoglycemia, unfortunately, his insurance did not provide coverage without the diagnosis of diabetes.  I did give him a Stelo sample at last visit to try.  He has been using this but notes it is too expensive for him to keep up with moving forward.  Analysis of his Stelo shows TIR 89%, TAR 11%, TBR <1% with a GMI of 6.3%.  Since last visit he has not had as many episodes of hypoglycemia.  He has cut way back on his soda intake drinking lots more water  instead.  He still admits to eating not so healthy things from time to time but he has more balance than before.  We did discuss the importance of nutrition today.  Of the importance of eating 3 well balanced meals so that the body has the fuel it needs to work properly.    -  Suggestion is made for the patient to avoid simple carbohydrates from their diet including Cakes, Sweet Desserts / Pastries, Ice Cream, Soda (diet and regular), Sweet Tea, Candies, Chips, Cookies, Sweet Pastries, Store Bought  Juices, Alcohol in Excess of 1-2 drinks a day, Artificial Sweeteners, Coffee Creamer, and Sugar-free Products. This will help patient to have stable blood glucose profile and potentially avoid unintended weight gain.   - I encouraged the patient to switch to unprocessed or minimally processed complex starch and increased protein intake (animal or plant source), fruits, and vegetables.   - Patient is advised to stick to a routine mealtimes to eat 3 meals a day and avoid unnecessary snacks (to snack only to correct hypoglycemia).  The following Lifestyle Medicine recommendations according to American College of Lifestyle Medicine [facility]) were discussed and offered to patient and he agrees to start the journey:  A. Whole Foods, Plant-based plate comprising of fruits and vegetables,  plant-based proteins, whole-grain carbohydrates was discussed in detail with the patient.   A list for source of those nutrients were also provided to the patient.  Patient will use only water  or unsweetened tea for hydration. B.  The need to stay away from risky substances including alcohol, smoking; obtaining 7 to 9 hours of restorative sleep, at least 150 minutes of moderate intensity exercise weekly, the importance of healthy social connections,  and stress reduction techniques were discussed. C.  A full color page of  Calorie density of various food groups per pound showing examples of each food groups was provided to the patient.   - he is advised to maintain close follow up with Bevely Doffing, FNP for primary care needs.     I spent  39  minutes in the care of the patient today including review of labs from CMP, Lipids, Thyroid  Function, Hematology (current and previous including abstractions from other facilities); face-to-face time discussing  his blood glucose readings/logs, discussing hypoglycemia and hyperglycemia episodes and symptoms, medications doses, his options of short and long term treatment based on the latest standards of care / guidelines;  discussion about incorporating lifestyle medicine;  and documenting the encounter. Risk reduction counseling performed per USPSTF guidelines to reduce obesity and cardiovascular risk factors.     Please refer to Patient Instructions for Blood Glucose Monitoring and Insulin /Medications Dosing Guide  in media tab for additional information. Please  also refer to  Patient Self Inventory in the Media  tab for reviewed elements of pertinent patient history.  William Farley participated in the discussions, expressed understanding, and voiced agreement with the above plans.  All questions were answered to his satisfaction. he is encouraged to contact clinic should he have any questions or concerns prior to his return visit.  Follow up  plan: Return in about 6 months (around 05/14/2024) for hypoglycemia follow up.   Benton Rio, [facility] [address] [place], [place] [address] Phone: [phone] Fax: [phone]   11/12/2023, 8:12 AM

## 2023-11-16 ENCOUNTER — Ambulatory Visit

## 2023-11-16 ENCOUNTER — Ambulatory Visit: Payer: Self-pay | Admitting: Urology

## 2023-11-16 DIAGNOSIS — F5104 Psychophysiologic insomnia: Secondary | ICD-10-CM | POA: Diagnosis not present

## 2023-11-16 DIAGNOSIS — F4312 Post-traumatic stress disorder, chronic: Secondary | ICD-10-CM | POA: Diagnosis not present

## 2023-11-16 MED ORDER — SERTRALINE HCL 100 MG PO TABS: 100.0000 mg | ORAL_TABLET | Freq: Every day | ORAL | 0 refills | Status: DC

## 2023-11-16 MED ORDER — LORAZEPAM 1 MG PO TABS: 1.0000 mg | ORAL_TABLET | Freq: Three times a day (TID) | ORAL | 0 refills | Status: DC | PRN

## 2023-11-16 MED ORDER — ZOLPIDEM TARTRATE ER 12.5 MG PO TBCR: 12.5000 mg | EXTENDED_RELEASE_TABLET | Freq: Every day | ORAL | 0 refills | Status: DC

## 2023-11-16 NOTE — Progress Notes (Signed)
   Established Patient Office Visit  Subjective   Patient ID: William Farley, male    DOB: 01/18/1961  Age: 63 y.o. MRN: [id number]  Chief Complaint  Patient presents with   Medical Management of Chronic Issues    4 month follow up   Discussed the use of AI scribe software for clinical note transcription with the patient, who gave verbal consent to proceed.  HPI History of Present Illness  History of Present Illness       William Farley is a 63 year old male who presents for medication management of anxiety.  Anxiety symptoms - Persistent anxiety, less severe than at previous visit - Sertraline  (Zoloft ) initiated at last visit provided initial improvement, but current benefit has plateaued  Medication management - Currently taking sertraline  (Zoloft ), zolpidem  (Ambien  CR), and lorazepam  - Scheduled to see a new psychiatrist in November.  Psychiatric follow-up - Recently evaluated by behavioral health and has f/u appt with Jerel Pepper, tomorrow.  - Psychiatric appointment scheduled for November.  - Prefers in-person behavioral health visits over video appointments   ROS    Objective:     BP 108/70   Pulse (!) 58   Ht 5' 11 (1.803 m)   Wt 230 lb 1.6 oz (104.4 kg)   SpO2 95%   BMI 32.09 kg/m  BP Readings from Last 3 Encounters:  11/16/23 108/70  11/12/23 108/60  11/10/23 104/66   Wt Readings from Last 3 Encounters:  11/16/23 230 lb 1.6 oz (104.4 kg)  11/12/23 217 lb 8 oz (98.7 kg)  09/28/23 211 lb 0.3 oz (95.7 kg)     Physical Exam Vitals and nursing note reviewed.  Constitutional:      Appearance: Normal appearance.  HENT:     Head: Normocephalic.  Cardiovascular:     Rate and Rhythm: Normal rate and regular rhythm.  Pulmonary:     Effort: Pulmonary effort is normal.     Breath sounds: Normal breath sounds.  Musculoskeletal:     Cervical back: Normal range of motion and neck supple.  Neurological:     Mental Status: He is alert and  oriented to person, place, and time.  Psychiatric:        Mood and Affect: Mood normal.        Behavior: Behavior normal.        Thought Content: Thought content normal.     No results found for any visits on 11/16/23.    The 10-year ASCVD risk score (Arnett DK, et al., 2019) is: 6.6%    Assessment & Plan:   Problem List Items Addressed This Visit       Other   Insomnia   Relevant Medications   LORazepam  (ATIVAN ) 1 MG tablet   zolpidem  (AMBIEN  CR) 12.5 MG CR tablet   Chronic post-traumatic stress disorder   Relevant Medications   LORazepam  (ATIVAN ) 1 MG tablet   sertraline  (ZOLOFT ) 100 MG tablet      Anxiety disorder Anxiety improved with sertraline  but plateaued. Psychiatric follow-up scheduled. - Increase dose of sertraline  to 100 mg.   - Monitor response to increased sertraline  dose. - Behavioral health follow-up tomorrow. - Refill lorazepam  for prn use of anxiety.    Insomnia Insomnia controlled with Ambien  CR. Aware of controlled substance refill policies. - Refill zolpidem  at [facility].    Return in about 3 months (around 02/16/2024) for chronic follow-up with PCP.    Leita Longs, FNP

## 2023-11-17 ENCOUNTER — Ambulatory Visit (INDEPENDENT_AMBULATORY_CARE_PROVIDER_SITE_OTHER): Admitting: Clinical

## 2023-11-17 DIAGNOSIS — F419 Anxiety disorder, unspecified: Secondary | ICD-10-CM | POA: Diagnosis not present

## 2023-11-17 DIAGNOSIS — F4310 Post-traumatic stress disorder, unspecified: Secondary | ICD-10-CM | POA: Diagnosis not present

## 2023-11-17 DIAGNOSIS — F331 Major depressive disorder, recurrent, moderate: Secondary | ICD-10-CM | POA: Diagnosis not present

## 2023-11-17 NOTE — Progress Notes (Signed)
 IN PERSON   I connected with William Farley on 11/17/23 at  8:00 AM EDT in person and verified that I am speaking with the correct person using two identifiers.  Location: Patient: office  Provider: office    I discussed the limitations of evaluation and management by telemedicine and the availability of in person appointments. The patient expressed understanding and agreed to proceed. ( IN PERSON )  THERAPIST PROGRESS NOTE   Session Time: 8:00 AM-8:45 AM   Participation Level: Active   Behavioral Response: CasualAlertAnxious   Type of Therapy: Individual Therapy   Treatment Goals addressed: Coping Anger Management   Interventions: CBT, Motivational Interviewing, Solution Focused and Supportive   Summary: William Farley is a 63 y.o. male who presents with PTSD  and MDD with Anxiety. The OPT therapist worked with the patient for his scheduled session. The OPT therapist utilized Motivational Interviewing to assist in creating therapeutic repore. The [facility] and his work in getting services for his mental health back in place. The patients med therapy is currently being managed through his PCP until his upcoming appointment with Jannelle Ruder for psychiatry in November. The patient spoke about life change adjustments with he and his wife being retired and his involvement in caregiving for his grandson after school now that [employer] have restarted for the [employer] .The OPT therapist utilized Cognitive Behavioral Therapy through cognitive restructuring as well as worked with the patient on coping strategies to assist in management of GAD. The patient continues to utlize his existing support network primarly limited to immediate family and his wife.The patient spoke about aknowledged adjustment in getting use to new providers as well as what he found to be helpful from his prior involvement with therapist and  psychiatrist.The OPT therapist over-viewed with the patient upcoming appointments as listed in his MyChart.   Suicidal/Homicidal: Nowithout intent/plan   Therapist Response: The OPT therapist worked with the patient for the patients scheduled session. The patient was engaged in his session and gave feedback in relation to triggers, symptoms, and behavior responses over the past few weeks. The patient spoke about his interactions with prior health providers through the [facility] leading to [facility]. Currently the patient is receiving his medications through his PCP until his upcoming appointment for psychiatry in November. The patient spoke about interactions in his relationship and adjustment for both he and his partner with being in the retired stage of life. The patient spoke about involvement in caregiving for his grandchild. The OPT therapist worked with the patient utilizing an in session Cognitive Behavioral Therapy exercise. The patient was responsive in the session and verbalized, I am looking to get to a place with the med therapy that helps me manage my anxiety and my PCP recently made a change and a dosage increase for my Anxiety medication.  The OPT therapist overviewed giving the medication a trial period over the next few weeks to allow for feedback from the patient about how the dosage increase is working in Insurance account manager of his Anxiety.The patient spoke about potential Fall coping skill of he and his wife doing some traveling. The OPT therapist worked with the patient overviewing appointments listed in the patients MyChart. The OPT therapist will continue treatment work with the patient in his next scheduled session.     Plan: Return again in 2/3 weeks.   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    Patient/Guardian was advised Release of Information must be obtained prior to any record release in order to collaborate their care with an outside provider. Patient/Guardian was advised if they have not already done so to contact the registration department to sign all necessary forms in order for us  to release information regarding their care.    Consent: Patient/Guardian gives verbal consent for treatment and assignment of benefits for services provided during this visit. Patient/Guardian expressed understanding and agreed to proceed.    I discussed the assessment and treatment plan with the patient. The patient was provided an opportunity to ask questions and all were answered. The patient agreed with the plan and demonstrated an understanding of the instructions.   The patient was advised to call back or seek an in-person evaluation if the symptoms worsen or if the condition fails to improve as anticipated.   I provided 45 minutes of face-to-face time during this encounter.   [name], LCSW    11/17/2023

## 2023-11-23 ENCOUNTER — Ambulatory Visit: Payer: Self-pay | Admitting: Urology

## 2023-12-15 ENCOUNTER — Encounter: Payer: Self-pay | Admitting: *Deleted

## 2023-12-15 ENCOUNTER — Ambulatory Visit: Admitting: Gastroenterology

## 2023-12-15 ENCOUNTER — Encounter: Payer: Self-pay | Admitting: Gastroenterology

## 2023-12-15 VITALS — BP 117/65 | HR 53 | Temp 98.1°F | Ht 71.0 in | Wt 220.0 lb

## 2023-12-15 DIAGNOSIS — K219 Gastro-esophageal reflux disease without esophagitis: Secondary | ICD-10-CM | POA: Diagnosis not present

## 2023-12-15 DIAGNOSIS — K760 Fatty (change of) liver, not elsewhere classified: Secondary | ICD-10-CM

## 2023-12-15 DIAGNOSIS — Z8719 Personal history of other diseases of the digestive system: Secondary | ICD-10-CM

## 2023-12-15 NOTE — Progress Notes (Signed)
 Gastroenterology Office Note     Primary Care Physician:  Bevely Doffing, FNP  Primary Gastroenterologist: Dr .Cindie    Chief Complaint   Chief Complaint  Patient presents with   Follow-up    Pt arrives for follow up. Pt states he is doing well; no issues at this time.      History of Present Illness   William Farley is a 63 y.o. male presenting today with a history of chronic GERD, chronic diarrhea in the past now improved, chronic-right sided abdominal pain over 30 years despite prior cholecystectomy, rectal bleeding due to internal hemorrhoids s/p banding, returning today in follow-up for routine visit.   He has not had any issues with diarrhea in a few months.  He has had resolution of rectal bleeding after hemorrhoid banding.  He is here today just for routine follow-up.  He is doing well without any issues of reflux, dysphagia, abdominal pain, nausea or vomiting, changes in weight, loss of appetite.  He continues Farley omeprazole 20 mg daily and doing well with this.  Upon review of chart, he does have a possible history of a fatty liver, and mild splenomegaly in the past.  He tells me that this dates back to when he was in the [employer] and was documented in his record.  He has not had a significant workup for this.  Platelets have been normal and ranging anywhere from 170s to 250.Hep C antibody negative in March 2024.  Upon review of labs, he had just a mild increase in bilirubin in April 2025 at 1.3.  AST was 31, ALT 18, alk phos 98.  Previously, his transaminases have been less than 30.  Bilirubin has been normal historically.  FIB 4 was  2.58 calculated today with recent labs.  He is interested in further evaluation for hepatosplenomegaly and any interventions for fatty liver.  EGD May 2025: esophageal mucosal changes consistent with barrett's, normal appearing GJ anastomosis, normal jejunum. Small hiatal hernia.   colonoscopy May 2025 with non-bleeding internal  hemorrhoids, diverticulosis, normal examined TI, random colonic biopsies unremarkable.   Past Medical History:  Diagnosis Date   Anxiety    Arthritis    CAD (coronary artery disease)    Minimal Non-obstructive CAD seen Farley coronary CTA 04/2021   Depression    GERD (gastroesophageal reflux disease)    History of kidney stones    History of thrombocytopenia    Hx of syncope    Hypoglycemia    PAF (paroxysmal atrial fibrillation) (HCC)    ChadsVasc Score is 0.  Monitor revealed less than 1% PAF burden.   Seizure (HCC) 02/2021   Sleep apnea    wears CPAP     Past Surgical History:  Procedure Laterality Date   APPENDECTOMY     BIOPSY  05/01/2021   Procedure: BIOPSY;  Surgeon: [name], MD;  [facility];  Service: Endoscopy;;   CERVICAL SPINE SURGERY  2018   CHOLECYSTECTOMY     COLONOSCOPY N/A 07/23/2023   Procedure: COLONOSCOPY;  Surgeon: [name], DO;  [facility]: [facility];  Service: Endoscopy;  Laterality: N/A;  8:30am;asa 2 ileocolonoscopy   COLONOSCOPY WITH PROPOFOL  N/A 05/01/2021   Procedure: COLONOSCOPY WITH PROPOFOL ;  Surgeon: [name], MD;  [facility];  Service: Endoscopy;  Laterality: N/A;  1050   CYST EXCISION     CYSTOSCOPY     CYSTOSCOPY WITH RETROGRADE PYELOGRAM, URETEROSCOPY AND STENT PLACEMENT Right 08/08/2020  Procedure: CYSTOSCOPY WITH RIGHT RETROGRADE PYELOGRAM AND RIGHT URETEROSCOPY;  Surgeon: [name], MD;  Location: [facility];  Service: Urology;  Laterality: Right;   ESOPHAGOGASTRODUODENOSCOPY N/A 07/23/2023   Procedure: EGD (ESOPHAGOGASTRODUODENOSCOPY);  Surgeon: [name], DO;  [facility];  Service: Endoscopy;  Laterality: N/A;  8:30am;asa 2   EXTRACORPOREAL SHOCK WAVE LITHOTRIPSY Right 05/21/2020   Procedure: EXTRACORPOREAL SHOCK WAVE LITHOTRIPSY (ESWL);  Surgeon: [name], MD;  Location: [facility];  Service: Urology;  Laterality: Right;   GASTRIC BYPASS OPEN  2003   INCISIONAL  HERNIA REPAIR     kidney stone removal     KNEE ARTHROSCOPY Left    multiple   NECK SURGERY     POLYPECTOMY  05/01/2021   Procedure: POLYPECTOMY;  Surgeon: [name], MD;  [facility];  Service: Endoscopy;;   POSTERIOR FUSION PEDICLE SCREW PLACEMENT N/A 05/06/2023   Procedure: POSTERIOR LUMBAR INTERBODY FUSION LUMBAR FOUR-FIVE POSTERIOR LATERAL AND INTERBODY FUSION;  Surgeon: [name], MD;  Location: [facility];  Service: Neurosurgery;  Laterality: N/A;   REPLACEMENT TOTAL KNEE Right    RHINOPLASTY     STONE EXTRACTION WITH BASKET Right 08/08/2020   Procedure: STONE EXTRACTION WITH BASKET;  Surgeon: [name], MD;  Location: [facility];  Service: Urology;  Laterality: Right;   TOTAL KNEE ARTHROPLASTY Left 03/10/2022   Procedure: TOTAL KNEE ARTHROPLASTY;  Surgeon: [name], MD;  Location: [facility];  Service: Orthopedics;  Laterality: Left;    Current Outpatient Medications  Medication Sig Dispense Refill   allopurinol  (ZYLOPRIM ) 300 MG tablet Take 1 tablet (300 mg total) by mouth daily. 90 tablet 3   LORazepam  (ATIVAN ) 1 MG tablet Take 1 tablet (1 mg total) by mouth every 8 (eight) hours as needed for anxiety. 90 tablet 0   metoprolol  succinate (TOPROL -XL) 25 MG 24 hr tablet Take 0.5 tablets (12.5 mg total) by mouth daily.     omeprazole (PRILOSEC) 20 MG capsule Take 20 mg by mouth in the morning.     sertraline  (ZOLOFT ) 100 MG tablet Take 1 tablet (100 mg total) by mouth daily. 90 tablet 0   tamsulosin  (FLOMAX ) 0.4 MG CAPS capsule Take 1 capsule (0.4 mg total) by mouth in the morning and at bedtime. 180 capsule 3   Vitamin D , Ergocalciferol , (DRISDOL ) 1.25 MG (50000 UNIT) CAPS capsule Take 1 capsule (50,000 Units total) by mouth every 7 (seven) days. 20 capsule 1   zolpidem  (AMBIEN  CR) 12.5 MG CR tablet Take 1 tablet (12.5 mg total) by mouth at bedtime. 30 tablet 0   No current facility-administered medications for this visit.    Allergies as of 12/15/2023  - Review Complete 12/15/2023  Allergen Reaction Noted   Codeine Itching, Rash, and Other (See Comments) 03/11/2012    Family History  Problem Relation Age of Onset   Heart disease Father 14   Kidney failure Father    Colon cancer Neg Hx     Social History   Socioeconomic History   Marital status: Married    Spouse name: Not Farley file   Number of children: Not Farley file   Years of education: Not Farley file   Highest education level: Associate degree: occupational, Scientist, product/process development, or vocational program  Occupational History   Occupation: Economist  Tobacco Use   Smoking status: Former    Current packs/day: 0.00    Average packs/day: 0.5 packs/day for 10.0 years (5.0 ttl pk-yrs)    Types: Cigarettes  Start date: 05/20/1986    Quit date: 05/20/1996    Years since quitting: 27.5    Passive exposure: Past   Smokeless tobacco: Never  Vaping Use   Vaping status: Never Used  Substance and Sexual Activity   Alcohol use: Not Currently   Drug use: Yes    Types: Marijuana    Comment: daily   Sexual activity: Yes  Other Topics Concern   Not Farley file  Social History Narrative   Right handed    Married wife   One story home   Social Drivers of Health   Financial Resource Strain: Low Risk  (07/04/2023)   Overall Financial Resource Strain (CARDIA)    Difficulty of Paying Living Expenses: Not very hard  Food Insecurity: No Food Insecurity (07/04/2023)   Hunger Vital Sign    Worried About Running Out of Food in the Last Year: Never true    Ran Out of Food in the Last Year: Never true  Transportation Needs: Unmet Transportation Needs (07/04/2023)   PRAPARE - Transportation    Lack of Transportation (Medical): Yes    Lack of Transportation (Non-Medical): Yes  Physical Activity: Unknown (07/04/2023)   Exercise Vital Sign    Days of Exercise per Week: 0 days    Minutes of Exercise per Session: Not Farley file  Recent Concern: Physical Activity - Inactive (07/04/2023)   Exercise  Vital Sign    Days of Exercise per Week: 0 days    Minutes of Exercise per Session: 10 min  Stress: Stress Concern Present (07/04/2023)   [employer] of Occupational Health - Occupational Stress Questionnaire    Feeling of Stress : Very much  Social Connections: Moderately Integrated (07/04/2023)   Social Connection and Isolation Panel    Frequency of Communication with Friends and Family: Never    Frequency of Social Gatherings with Friends and Family: Never    Attends Religious Services: More than 4 times per year    Active Member of [employer] or Organizations: Yes    Attends Engineer, structural: More than 4 times per year    Marital Status: Married  Catering manager Violence: Not At Risk (03/10/2022)   Humiliation, Afraid, Rape, and Kick questionnaire    Fear of Current or Ex-Partner: No    Emotionally Abused: No    Physically Abused: No    Sexually Abused: No     Review of Systems   Gen: Denies any fever, chills, fatigue, weight loss, lack of appetite.  CV: Denies chest pain, heart palpitations, peripheral edema, syncope.  Resp: Denies shortness of breath at rest or with exertion. Denies wheezing or cough.  GI: Denies dysphagia or odynophagia. Denies jaundice, hematemesis, fecal incontinence. GU : Denies urinary burning, urinary frequency, urinary hesitancy MS: Denies joint pain, muscle weakness, cramps, or limitation of movement.  Derm: Denies rash, itching, dry skin Psych: Denies depression, anxiety, memory loss, and confusion Heme: Denies bruising, bleeding, and enlarged lymph nodes.   Physical Exam   BP 117/65   Pulse (!) 53   Temp 98.1 F (36.7 C)   Ht 5' 11 (1.803 m)   Wt 220 lb (99.8 kg) Comment: pt reported  BMI 30.68 kg/m  General:   Alert and oriented. Pleasant and cooperative. Well-nourished and well-developed.  Head:  Normocephalic and atraumatic. Eyes:  Without icterus Abdomen:  +BS, soft, non-tender and non-distended. No HSM noted. No  guarding or rebound. No masses appreciated.  Rectal:  Deferred  Msk:  Symmetrical without gross deformities. Normal  posture. Extremities:  Without edema. Neurologic:  Alert and  oriented x4;  grossly normal neurologically. Skin:  Intact without significant lesions or rashes. Psych:  Alert and cooperative. Normal mood and affect.   Assessment   William Farley is a 63 y.o. male presenting today with a history of chronic diarrhea now resolved, chronic GERD, rectal bleeding due to internal hemorrhoids with hemorrhoid banding earlier this year and great results, returning today for routine follow-up.  GERD is well-controlled Farley omeprazole once daily without any alarm signs or symptoms.  He will continue this once daily.  Rectal bleeding has resolved,.  He had excellent results with hemorrhoid banding  We discussed today his history of possible fatty liver and mild splenomegaly.  He notes that this was documented in his records while he was in the [employer] many years ago.  His fib 4 was calculated and is elevated, which means we need to do further evaluation.  He is interested in further evaluation.  Will check an ultrasound of the abdomen complete, and I have also ordered HFP and ELF.  If he has any elevated LFTs, we will need to do further evaluation.  I must note that he has a negative hep C antibody Farley file.   PLAN    Ultrasound abdomen complete Check LFTs and ELF Continue omeprazole once daily Further recommendations regarding possible fatty liver once results are reviewed.  Otherwise we will see him back in 6 months or sooner if needed.   Therisa MICAEL Stager, PhD, ANP-BC [facility]

## 2023-12-15 NOTE — Patient Instructions (Signed)
 Please have blood work completed when you are able. I have also ordered an ultrasound. We are assessing for fatty liver!  We will see you in 6 months or sooner if needed!  I enjoyed seeing you again today! I value our relationship and want to provide genuine, compassionate, and quality care. You may receive a survey regarding your visit with me, and I welcome your feedback! Thanks so much for taking the time to complete this. I look forward to seeing you again.      Therisa MICAEL Stager, PhD, ANP-BC [facility]

## 2023-12-16 ENCOUNTER — Telehealth: Payer: Self-pay | Admitting: Nurse Practitioner

## 2023-12-16 ENCOUNTER — Encounter: Payer: Self-pay | Admitting: Nurse Practitioner

## 2023-12-16 ENCOUNTER — Other Ambulatory Visit

## 2023-12-16 ENCOUNTER — Other Ambulatory Visit: Payer: Self-pay | Admitting: Nurse Practitioner

## 2023-12-16 ENCOUNTER — Ambulatory Visit: Attending: Nurse Practitioner | Admitting: Nurse Practitioner

## 2023-12-16 VITALS — BP 118/70 | HR 61 | Ht 71.0 in | Wt 219.8 lb

## 2023-12-16 DIAGNOSIS — R001 Bradycardia, unspecified: Secondary | ICD-10-CM

## 2023-12-16 DIAGNOSIS — I480 Paroxysmal atrial fibrillation: Secondary | ICD-10-CM

## 2023-12-16 DIAGNOSIS — Z87898 Personal history of other specified conditions: Secondary | ICD-10-CM | POA: Diagnosis not present

## 2023-12-16 DIAGNOSIS — I2510 Atherosclerotic heart disease of native coronary artery without angina pectoris: Secondary | ICD-10-CM

## 2023-12-16 NOTE — Patient Instructions (Addendum)
 Medication Instructions:  Your physician recommends that you continue on your current medications as directed. Please refer to the Current Medication list given to you today.  Labwork: In 1 week at [facility]   Testing/Procedures: GEOFFRY- Long Term Monitor Instructions   Your physician has requested you wear your ZIO patch monitor 14 days.   This is a single patch monitor.  [employer] supplies one patch monitor per enrollment.  Additional stickers are not available.   Please do not apply patch if you will be having a Nuclear Stress Test, Echocardiogram, Cardiac CT, MRI, or Chest Xray during the time frame you would be wearing the monitor. The patch cannot be worn during these tests.  You cannot remove and re-apply the ZIO XT patch monitor.   Your ZIO patch monitor will be sent USPS Priority mail from [facility] directly to your home address. The monitor may also be mailed to a PO BOX if home delivery is not available.   It may take 3-5 days to receive your monitor after you have been enrolled.   Once you have received you monitor, please review enclosed instructions.  Your monitor has already been registered assigning a specific monitor serial # to you.   Applying the monitor   Shave hair from upper left chest.   Hold abrader disc by orange tab.  Rub abrader in 40 strokes over left upper chest as indicated in your monitor instructions.   Clean area with 4 enclosed alcohol pads .  Use all pads to assure are is cleaned thoroughly.  Let dry.   Apply patch as indicated in monitor instructions.  Patch will be place under collarbone on left side of chest with arrow pointing upward.   Rub patch adhesive wings for 2 minutes.Remove white label marked 1.  Remove white label marked 2.  Rub patch adhesive wings for 2 additional minutes.   While looking in a mirror, press and release button in center of patch.  A small green light will flash 3-4 times .  This will be your only  indicator the monitor has been turned on.     Do not shower for the first 24 hours.  You may shower after the first 24 hours.   Press button if you feel a symptom. You will hear a small click.  Record Date, Time and Symptom in the Patient Log Book.   When you are ready to remove patch, follow instructions on last 2 pages of Patient Log Book.  Stick patch monitor onto last page of Patient Log Book.   Place Patient Log Book in [place] box.  Use locking tab on box and tape box closed securely.  The [employer] and [employer] has [employer] on it.  Please place in mailbox as soon as possible.  Your physician should have your test results approximately 7 days after the monitor has been mailed back to [facility].   Call [facility] Customer Care at [phone] if you have questions regarding your ZIO XT patch monitor.  Call them immediately if you see an orange light blinking on your monitor.   If your monitor falls off in less than 4 days contact our Monitor department at [phone].  If your monitor becomes loose or falls off after 4 days call [employer] at [phone] for suggestions on securing your monitor.  Follow-Up: Your physician recommends that you schedule a follow-up appointment in: 6-8 weeks   Any Other Special Instructions Will Be Listed Below (If Applicable).  If  you need a refill on your cardiac medications before your next appointment, please call your pharmacy.

## 2023-12-16 NOTE — Telephone Encounter (Signed)
 Checking percert on the following    ZIO AT:14 Day-Bradycardia

## 2023-12-16 NOTE — Progress Notes (Addendum)
 Cardiology Office Note:    Date:  12/16/2023 ID:  William Farley, DOB 11-20-1960, MRN [id number] PCP:  Bevely Doffing, FNP [facility] Providers Cardiologist:  Jayson Sierras, MD    Referring MD: Zarwolo, Gloria, FNP   CC: Here for 6 month follow-up appointment   History of Present Illness:    William Farley is a 63 y.o. male with a PMH of CAD, PAF, previous history of syncope and collapse, history of seizure disorder, history of thrombocytopenia, anxiety, chronic PTSD, who presents today for 6 for follow-up appointment.  In the past he was evaluated for syncopal event in 02/2021 that was attributed to his seizure,  got up to go to the bathroom and passed out, wife noted seizure-like activity. Workup at [facility] in [place] was overall negative, troponins negative, EKG negative.  CT scan and MRI of brain, normal.  EEG was normal.  Echocardiogram revealed normal LV function. Monitor revealed less than 1% burden of PAF, had multiple brief episodes of SVT.  Dr. Sierras who reviewed monitor was hesitant to start anticoagulation without further information.   Last seen by Dr. Barbaraann in January 2023.  Pt stated he did not feel any A-fib and denied any tachycardia or palpitations.  Denied any chest pain or shortness of breath surrounding syncopal event, but did note intermittent SHOB, that was thought to be anxiety related. CHA2DS2-VASc score 0.  Etiology unclear for syncopal event, denied any recurrences of syncope.  Started on Toprol  XL 25 mg daily to prevent any fast heart rates.  Underwent coronary CTA that revealed coronary calcium score 19, 50th percent for his demographic, nonobstructive CAD with calcified plaque in proximal LAD causing minimal (0 to 24%) stenosis.  Was told to follow-up in 6 months.  I last saw him for follow-up on February 19, 2022.  He was pending future left total knee arthroplasty at this time.  Was overall doing well.    06/10/2023 - Today presents for  follow-up.  Admits to fatigue, partly attributes this to his episodes of hypoglycemia.  Says he is no longer on CGM.  Says he takes a nightly meal to help prevent symptoms at night.  He is being followed by endocrinology for this. Denies any chest pain, shortness of breath, palpitations, syncope, presyncope, dizziness, orthopnea, PND, swelling or significant weight changes, acute bleeding, or claudication.  Does not check his BP at home but says BP readings at office visits have been lower recently.  12/16/2023 - Here for follow-up.  Does tell me he has noticed some low heart rate readings on his Apple watch noted today.  Does feel sometimes sluggish, attributes this to history of malnutrition with low blood sugars in the past, denies any syncope.  Says he is feeling better due to eating more protein.  Shows me Apple watch readings on his phone that show lowest heart rate reading seen around 44-45. Denies any chest pain, shortness of breath, palpitations, syncope, presyncope, dizziness, orthopnea, PND, swelling or significant weight changes, acute bleeding, or claudication.   SH: Officially retired from the [employer]. Former smoker, does occasionally smoke weed for hx of PTSD, says his providers are aware about this.  Please see the history of present illness.    All other systems reviewed and are negative.  EKGs/Labs/Other Studies Reviewed:    The following studies were reviewed today:   EKG:  EKG is not ordered today.   Echo 06/2023:   1. Left ventricular ejection fraction, by estimation, is 60  to 65%. Left  ventricular ejection fraction by 3D volume is 62 %. The left ventricle has  normal function. The left ventricle has no regional wall motion  abnormalities. Left ventricular diastolic   parameters were normal. The average left ventricular global longitudinal  strain is -21.5 %. The global longitudinal strain is normal.   2. Right ventricular systolic function is normal. The right  ventricular  size is normal. There is normal pulmonary artery systolic pressure.   3. The mitral valve is normal in structure. Mild mitral valve  regurgitation. No evidence of mitral stenosis.   4. The aortic valve is tricuspid. Aortic valve regurgitation is not  visualized. No aortic stenosis is present.   5. The inferior vena cava is normal in size with greater than 50%  respiratory variability, suggesting right atrial pressure of 3 mmHg.   Comparison(s): A prior study was performed on 03/13/2021. No significant  change from prior study.  MRI of brain on May 24, 2021: Normal brain MRI.  Coronary CTA on April 30, 2021: 1. Coronary calcium score of 19. This was 50th percentile for age and sex matched control. 2.  Normal coronary origin with right dominance. 3. Nonobstructive CAD, with calcified plaque in proximal LAD causing minimal (0-24%) stenosis. CAD-RADS 1. Minimal non-obstructive CAD (0-24%). Consider non-atherosclerotic causes of chest pain. Consider preventive therapy and risk factor modification.  4.  No suspicious nodules, masses, or infiltrates are identified in the visualized portion of the lungs.  Mild scarring noted in both lung bases.  No pleural fluid seen 5.  Prior gastric bypass surgery is noted, with a small hiatal hernia.  14-day ZIO monitor on April 03, 2021: Predominant rhythm is sinus with heart rate ranging from 47 bpm up to 140 bpm and average heart rate 77 bpm. There were rare PACs including couplets and triplets representing less than 1% total beats. There were rare PVCs including couplets representing less than 1% total beats.   Multiple, brief episodes of SVT were noted.  An episode of sustained atrial fibrillation with RVR was also noted on December 24 with heart rate in the 150s on average. Atrial fibrillation represented less than 1% total rhythm burden however.   There were no pauses.  2D echocardiogram on March 13, 2021:  1. Left  ventricular ejection fraction, by estimation, is 65 to 70%. The  left ventricle has normal function. The left ventricle has no regional  wall motion abnormalities. The left ventricular internal cavity size was  mildly dilated. Left ventricular  diastolic parameters were normal.   2. Right ventricular systolic function is normal. The right ventricular  size is normal. There is normal pulmonary artery systolic pressure.   3. The mitral valve is normal in structure. Trivial mitral valve  regurgitation.   4. The aortic valve is tricuspid. Aortic valve regurgitation is not  visualized.  Risk Assessment/Calculations:      The 10-year ASCVD risk score (Arnett DK, et al., 2019) is: 7.6%   Values used to calculate the score:     Age: 58 years     Clincally relevant sex: Male     Is Non-Hispanic African American: No     Diabetic: No     Tobacco smoker: No     Systolic Blood Pressure: 118 mmHg     Is BP treated: Yes     HDL Cholesterol: 56.6 mg/dL     Total Cholesterol: 172 mg/dL  Physical Exam:    VS:  BP 118/70   Pulse  61   Ht 5' 11 (1.803 m)   Wt 219 lb 12.8 oz (99.7 kg)   SpO2 98%   BMI 30.66 kg/m     Wt Readings from Last 3 Encounters:  12/16/23 219 lb 12.8 oz (99.7 kg)  12/15/23 220 lb (99.8 kg)  11/16/23 230 lb 1.6 oz (104.4 kg)    GEN: Well developed and well nourished, 63 y.o. male in no acute distress HEENT: Normal NECK: No JVD; No carotid bruits CARDIAC: S1/S2, RRR, no murmurs, rubs, gallops; 2+ peripheral pulses throughout, strong and equal bilaterally RESPIRATORY:  Clear to auscultation without rales, wheezing or rhonchi  MUSCULOSKELETAL:  No edema; No deformity  SKIN: Warm and dry NEUROLOGIC:  Alert and oriented x 3 PSYCHIATRIC:  Normal, pleasant affect   ASSESSMENT & PLAN:    In order of problems listed above:  1. Minimal, non-obstructive CAD Coronary CTA in February 2023 revealed coronary calcium score 19, 50th percentile for his demographic.   Nonobstructive CAD with calcified plaque noted in proximal LAD causing minimal stenosis. Stable with no anginal symptoms. No indication for ischemic evaluation. PCP to manage lipids. Last lipid panel in 03/2023 was overall unremarkable. Continue current medication regimen. Heart healthy diet and regular cardiovascular exercise encouraged.    3. PAF, bradycardia Denies any tachycardia or palpitations.  Does admit to some low heart rate readings, overall asymptomatic with this.  CHA2DS2-VASc score 0.  Because CHA2DS2-VASc score is 0, he does not require AC therapy. Heart healthy diet and regular cardiovascular exercise as tolerated encouraged.  Will arrange 2-week live ZIO AT monitor to evaluate heart rate and rhythm better.  No medication changes at this time.  Care and ED precautions discussed.  Will also obtain BMET and magnesium for further evaluation.  4. Hx of syncope Denies any recurrent syncope, presyncope or any dizziness. Etiology for past syncopeal event unclear. Continue to follow with PCP. Heart healthy diet and regular cardiovascular exercise encouraged.   5. Disposition: Care and ED precautions discussed.  Follow-up with Dr. Debera or APP in 6-8 weeks or sooner if anything changes.    Medication Adjustments/Labs and Tests Ordered: Current medicines are reviewed at length with the patient today.  Concerns regarding medicines are outlined above.  Orders Placed This Encounter  Procedures   Basic Metabolic Panel (BMET)   Magnesium   No orders of the defined types were placed in this encounter.   Patient Instructions  Medication Instructions:  Your physician recommends that you continue on your current medications as directed. Please refer to the Current Medication list given to you today.  Labwork: In 1 week at [facility]   Testing/Procedures: GEOFFRY- Long Term Monitor Instructions   Your physician has requested you wear your ZIO patch monitor 14 days.   This is a single  patch monitor.  [employer] supplies one patch monitor per enrollment.  Additional stickers are not available.   Please do not apply patch if you will be having a Nuclear Stress Test, Echocardiogram, Cardiac CT, MRI, or Chest Xray during the time frame you would be wearing the monitor. The patch cannot be worn during these tests.  You cannot remove and re-apply the ZIO XT patch monitor.   Your ZIO patch monitor will be sent USPS Priority mail from [facility] directly to your home address. The monitor may also be mailed to a PO BOX if home delivery is not available.   It may take 3-5 days to receive your monitor after you have been enrolled.  Once you have received you monitor, please review enclosed instructions.  Your monitor has already been registered assigning a specific monitor serial # to you.   Applying the monitor   Shave hair from upper left chest.   Hold abrader disc by orange tab.  Rub abrader in 40 strokes over left upper chest as indicated in your monitor instructions.   Clean area with 4 enclosed alcohol pads .  Use all pads to assure are is cleaned thoroughly.  Let dry.   Apply patch as indicated in monitor instructions.  Patch will be place under collarbone on left side of chest with arrow pointing upward.   Rub patch adhesive wings for 2 minutes.Remove white label marked 1.  Remove white label marked 2.  Rub patch adhesive wings for 2 additional minutes.   While looking in a mirror, press and release button in center of patch.  A small green light will flash 3-4 times .  This will be your only indicator the monitor has been turned on.     Do not shower for the first 24 hours.  You may shower after the first 24 hours.   Press button if you feel a symptom. You will hear a small click.  Record Date, Time and Symptom in the Patient Log Book.   When you are ready to remove patch, follow instructions on last 2 pages of Patient Log Book.  Stick patch monitor onto last  page of Patient Log Book.   Place Patient Log Book in [place] box.  Use locking tab on box and tape box closed securely.  The [employer] and [employer] has [employer] on it.  Please place in mailbox as soon as possible.  Your physician should have your test results approximately 7 days after the monitor has been mailed back to [facility].   Call [facility] Customer Care at [phone] if you have questions regarding your ZIO XT patch monitor.  Call them immediately if you see an orange light blinking on your monitor.   If your monitor falls off in less than 4 days contact our Monitor department at [phone].  If your monitor becomes loose or falls off after 4 days call [employer] at [phone] for suggestions on securing your monitor.  Follow-Up: Your physician recommends that you schedule a follow-up appointment in: 6-8 weeks   Any Other Special Instructions Will Be Listed Below (If Applicable).  If you need a refill on your cardiac medications before your next appointment, please call your pharmacy.   Signed, Almarie Crate, NP  12/16/2023 1:07 PM    [facility]

## 2023-12-17 ENCOUNTER — Ambulatory Visit: Payer: Self-pay | Admitting: Nurse Practitioner

## 2023-12-17 ENCOUNTER — Other Ambulatory Visit (HOSPITAL_COMMUNITY)
Admission: RE | Admit: 2023-12-17 | Discharge: 2023-12-17 | Disposition: A | Discharge status: Home / Self Care | Source: Ambulatory Visit | Attending: Nurse Practitioner | Admitting: Nurse Practitioner

## 2023-12-17 DIAGNOSIS — R001 Bradycardia, unspecified: Secondary | ICD-10-CM | POA: Insufficient documentation

## 2023-12-17 LAB — BASIC METABOLIC PANEL WITH GFR
Anion gap: 13 (ref 5–15)
BUN: 16 mg/dL (ref 8–23)
CO2: 22 mmol/L (ref 22–32)
Calcium: 8.7 mg/dL — ABNORMAL LOW (ref 8.9–10.3)
Chloride: 104 mmol/L (ref 98–111)
Creatinine, Ser: 0.97 mg/dL (ref 0.61–1.24)
GFR, Estimated: >60 mL/min (ref >60)
Glucose, Bld: 106 mg/dL — ABNORMAL HIGH (ref 70–99)
Potassium: 4.3 mmol/L (ref 3.5–5.1)
Sodium: 139 mmol/L (ref 135–145)

## 2023-12-17 LAB — MAGNESIUM: Magnesium: 2 mg/dL (ref 1.7–2.4)

## 2023-12-19 LAB — HEPATIC FUNCTION PANEL
ALT: 16 IU/L (ref 0–44)
AST: 25 IU/L (ref 0–40)
Albumin: 4.4 g/dL (ref 3.9–4.9)
Alkaline Phosphatase: 116 IU/L (ref 47–123)
Bilirubin Total: 0.5 mg/dL (ref 0.0–1.2)
Bilirubin, Direct: 0.16 mg/dL (ref 0.00–0.40)
Total Protein: 6.8 g/dL (ref 6.0–8.5)

## 2023-12-19 LAB — ENHANCED LIVER FIBROSIS (ELF): ELF(TM) Score: 10.26 — ABNORMAL HIGH (ref <9.80)

## 2023-12-21 ENCOUNTER — Telehealth (INDEPENDENT_AMBULATORY_CARE_PROVIDER_SITE_OTHER): Payer: Self-pay | Admitting: *Deleted

## 2023-12-21 NOTE — Telephone Encounter (Signed)
 When patient was checked in for his OV on 12/14/24 - he was advised we didn't have an updated VA auth and he said that was ok to bill  his [employer] - [place] was taken off as the insurance to bill for that visit

## 2023-12-22 ENCOUNTER — Other Ambulatory Visit: Payer: Self-pay

## 2023-12-22 ENCOUNTER — Ambulatory Visit (INDEPENDENT_AMBULATORY_CARE_PROVIDER_SITE_OTHER): Admitting: Clinical

## 2023-12-22 DIAGNOSIS — F5104 Psychophysiologic insomnia: Secondary | ICD-10-CM

## 2023-12-22 DIAGNOSIS — F331 Major depressive disorder, recurrent, moderate: Secondary | ICD-10-CM

## 2023-12-22 DIAGNOSIS — F4310 Post-traumatic stress disorder, unspecified: Secondary | ICD-10-CM | POA: Diagnosis not present

## 2023-12-22 NOTE — Progress Notes (Signed)
 IN PERSON    I connected with William Farley on 12/22/23 at  9:00 AM EDT in person and verified that I am speaking with the correct person using two identifiers.   Location: Patient: office  Provider: office    I discussed the limitations of evaluation and management by telemedicine and the availability of in person appointments. The patient expressed understanding and agreed to proceed. ( IN PERSON )   THERAPIST PROGRESS NOTE   Session Time: 9:00 AM-9:30 AM   Participation Level: Active   Behavioral Response: CasualAlertAnxious   Type of Therapy: Individual Therapy   Treatment Goals addressed: Coping Anger Management   Interventions: CBT, Motivational Interviewing, Solution Focused and Supportive   Summary: William Farley is a 63 y.o. male who presents with PTSD  and MDD with Anxiety. The OPT therapist worked with the patient for his scheduled session. The OPT therapist utilized Motivational Interviewing to assist in creating therapeutic repore.  The patient presented in a elated happier mood this session speaking to improvements through a combination of changes over the course of the past few weeks.The patient spoke about improved communication with his wife, benefit from med therapy, and being able to better control his emotions and reactive behaviors. The patients med therapy will continue to be managed by psychiatrist William Farley. The patient spoke about life change adjustments with he and his wife being retired and his involvement in caregiving for his grandson after school ,and attending his sports related events on weekends. The OPT therapist utilized Cognitive Behavioral Therapy through cognitive restructuring as well as worked with the patient on coping strategies to assist in management of GAD. The patient continues to utlize his existing support network primarly limited to immediate family and his wife.The patient spoke about acknowledged of the importance of consistency in  maintaining protective factors to stay on the same improved trajectory. The patient noted his mental health and physical health have both been better in the last few weeks than they have been over the last few years.The OPT therapist over-viewed with the patient upcoming appointments as listed in his MyChart.   Suicidal/Homicidal: Nowithout intent/plan   Therapist Response: The OPT therapist worked with the patient for the patients scheduled session. The patient was engaged in his session and gave feedback in relation to triggers, symptoms, and behavior responses over the past few weeks.  The patient presented in a elated happier mood this session speaking to improvements through a combination of changes over the course of the past few weeks.The patient spoke about improved communication with his wife, benefit from med therapy, and being able to better control his emotions and reactive behaviors. The patients med therapy will continue to be managed by psychiatrist William Farley. The patient spoke about life change adjustments with he and his wife being retired and his involvement in caregiving for his grandson after school ,and attending his sports related events on weekends. . The OPT therapist worked with the patient utilizing an in session Cognitive Behavioral Therapy exercise. The patient was responsive in the session and verbalized, I feel the improved communication with my wife has been huge and not having the same triggers I have been able to control my emotions better.  The OPT therapist overviewed maintaining protective factors including consistency with med management, communication with his wife, utilization of his support network, spending leisure time with grandchild, and involvement in community based organization with the [employer] as key pieces to stay on the improved trajectory.The patient spoke  about potential Fall coping skill of he and his wife doing some traveling and camping. The OPT  therapist worked with the patient overviewing appointments listed in the patients MyChart. The OPT therapist will continue treatment work with the patient in his next scheduled session.     Plan: Return again in 2/3 weeks.   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   Patient/Guardian was advised Release of Information must be obtained prior to any record release in order to collaborate their care with an outside provider. Patient/Guardian was advised if they have not already done so to contact the registration department to sign all necessary forms in order for us  to release information regarding their care.    Consent: Patient/Guardian gives verbal consent for treatment and assignment of benefits for services provided during this visit. Patient/Guardian expressed understanding and agreed to proceed.    I discussed the assessment and treatment plan with the patient. The patient was provided an opportunity to ask questions and all were answered. The patient agreed with the plan and demonstrated an understanding of the instructions.   The patient was advised to call back or seek an in-person evaluation if the symptoms worsen or if the condition fails to improve as anticipated.   I provided 30 minutes of face-to-face time during this encounter.   William ONEIDA Pepper, LCSW    12/22/2023

## 2023-12-24 ENCOUNTER — Ambulatory Visit (HOSPITAL_COMMUNITY)
Admission: RE | Admit: 2023-12-24 | Discharge: 2023-12-24 | Disposition: A | Discharge status: Home / Self Care | Source: Ambulatory Visit | Attending: Gastroenterology | Admitting: Gastroenterology

## 2023-12-24 DIAGNOSIS — K760 Fatty (change of) liver, not elsewhere classified: Secondary | ICD-10-CM | POA: Insufficient documentation

## 2023-12-24 DIAGNOSIS — R161 Splenomegaly, not elsewhere classified: Secondary | ICD-10-CM | POA: Diagnosis not present

## 2023-12-27 ENCOUNTER — Other Ambulatory Visit: Payer: Self-pay | Admitting: Family Medicine

## 2023-12-27 DIAGNOSIS — E559 Vitamin D deficiency, unspecified: Secondary | ICD-10-CM

## 2023-12-29 NOTE — Progress Notes (Addendum)
 REICHEN HUTZLER                                          MRN: [id number]   12/29/2023   The VBCI Quality Team Specialist reviewed this patient medical record for the purposes of chart review for care gap closure. The following were reviewed: abstraction for care gap closure-glycemic status assessment.  JAHIEM FRANZONI                                          MRN: [id number]   01/31/2024   The VBCI Quality Team Specialist reviewed this patient medical record for the purposes of chart review for care gap closure. The following were reviewed: chart review for care gap closure-kidney health evaluation for diabetes:eGFR  and uACR.    VBCI Quality Team

## 2023-12-30 DIAGNOSIS — M4316 Spondylolisthesis, lumbar region: Secondary | ICD-10-CM | POA: Diagnosis not present

## 2023-12-30 DIAGNOSIS — Z6830 Body mass index (BMI) 30.0-30.9, adult: Secondary | ICD-10-CM | POA: Diagnosis not present

## 2024-01-01 ENCOUNTER — Ambulatory Visit: Payer: Self-pay | Admitting: Gastroenterology

## 2024-01-10 DIAGNOSIS — R001 Bradycardia, unspecified: Secondary | ICD-10-CM

## 2024-01-13 ENCOUNTER — Ambulatory Visit: Payer: Self-pay | Admitting: Nurse Practitioner

## 2024-01-17 ENCOUNTER — Encounter: Payer: Self-pay | Admitting: *Deleted

## 2024-01-19 ENCOUNTER — Telehealth: Payer: Self-pay

## 2024-01-19 NOTE — Telephone Encounter (Signed)
 Patient has rescheduled to sooner appointment now coming in 01/25/24.

## 2024-01-19 NOTE — Telephone Encounter (Signed)
 noted

## 2024-01-19 NOTE — Telephone Encounter (Signed)
 Patient scheduled an appointment next opening 04/11/2024, he will run out of medications before 04/11/2024, can provider call in enough medication til his next appointment, if not we need to call patient and get sooner appointment with provider approval.  LORazepam  (ATIVAN ) 1 MG tablet [[id number]]   zolpidem  (AMBIEN  CR) 12.5 MG CR tablet [[id number]]   sertraline  (ZOLOFT ) 100 MG tablet [[id number]]   Pharmacy: [facility]

## 2024-01-19 NOTE — Telephone Encounter (Signed)
 Scheduled next opening 04/11/2024 and sent message to refill enough meds until seen.

## 2024-01-22 ENCOUNTER — Other Ambulatory Visit: Payer: Self-pay

## 2024-01-22 DIAGNOSIS — F4312 Post-traumatic stress disorder, chronic: Secondary | ICD-10-CM

## 2024-01-22 DIAGNOSIS — F5104 Psychophysiologic insomnia: Secondary | ICD-10-CM

## 2024-01-24 ENCOUNTER — Telehealth: Payer: Self-pay | Admitting: Pharmacy Technician

## 2024-01-24 ENCOUNTER — Ambulatory Visit

## 2024-01-24 ENCOUNTER — Encounter: Payer: Self-pay | Admitting: Radiology

## 2024-01-24 ENCOUNTER — Other Ambulatory Visit (HOSPITAL_COMMUNITY): Payer: Self-pay

## 2024-01-24 ENCOUNTER — Other Ambulatory Visit: Payer: Self-pay

## 2024-01-24 VITALS — BP 124/76 | HR 68 | Ht 71.0 in | Wt 216.0 lb

## 2024-01-24 DIAGNOSIS — F4312 Post-traumatic stress disorder, chronic: Secondary | ICD-10-CM | POA: Diagnosis not present

## 2024-01-24 DIAGNOSIS — F5104 Psychophysiologic insomnia: Secondary | ICD-10-CM | POA: Diagnosis not present

## 2024-01-24 DIAGNOSIS — Z23 Encounter for immunization: Secondary | ICD-10-CM

## 2024-01-24 DIAGNOSIS — E559 Vitamin D deficiency, unspecified: Secondary | ICD-10-CM

## 2024-01-24 MED ORDER — LORAZEPAM 1 MG PO TABS: 1.0000 mg | ORAL_TABLET | Freq: Three times a day (TID) | ORAL | 0 refills | Status: DC | PRN

## 2024-01-24 MED ORDER — ZOLPIDEM TARTRATE ER 12.5 MG PO TBCR: 12.5000 mg | EXTENDED_RELEASE_TABLET | Freq: Every day | ORAL | 2 refills | Status: DC

## 2024-01-24 MED ORDER — VITAMIN D (ERGOCALCIFEROL) 1.25 MG (50000 UNIT) PO CAPS: 50000.0000 [IU] | ORAL_CAPSULE | ORAL | 11 refills | Status: AC

## 2024-01-24 MED ORDER — SERTRALINE HCL 100 MG PO TABS: 100.0000 mg | ORAL_TABLET | Freq: Every day | ORAL | 3 refills | Status: DC

## 2024-01-24 NOTE — Telephone Encounter (Signed)
 Pharmacy Patient Advocate Encounter   Received notification from CoverMyMeds that prior authorization for Zolpidem  Tartrate ER 12.5MG  er tablets is required/requested.   Insurance verification completed.   The patient is insured through [employer].   Per test claim: PA required; PA submitted to above mentioned insurance via Latent Key/confirmation #/EOC [id number] Status is pending

## 2024-01-24 NOTE — Progress Notes (Signed)
 Established Patient Office Visit  Subjective   Patient ID: William Farley, male    DOB: 1960/08/22  Age: 63 y.o. MRN: [id number]  Chief Complaint  Patient presents with   Medical Management of Chronic Issues    Follow up/Med Refills     HPI Discussed the use of AI scribe software for clinical note transcription with the patient, who gave verbal consent to proceed.  History of Present Illness    William Farley is a 63 year old male with a history of unexplained syncope who presents for follow-up regarding heart rate monitoring and gastrointestinal symptoms.  Syncope and cardiac rhythm disturbances - Unexplained syncopal episode occurred a few years ago, resulting in an emergency room visit - Initial investigations were inconclusive for cardiac or neurological etiology - Episodes of tachycardia led to initiation of metoprolol  - Developed bradycardia with resting heart rate of 48-52 bpm, monitored via smartwatch - Two-week heart monitoring test was unremarkable  Gastrointestinal symptoms - Daily stomach upset, diarrhea, and vomiting since increase of Zoloft  from 50 mg to 100 mg - Symptoms occur regardless of whether medication is taken on an empty stomach - Suspects possible contribution from over-the-counter vitamin D  supplementation at 1000 mg daily, taken due to difficulty obtaining prescription  Mental health status - Improved mental health over the past three months attributed to medication adjustments and ongoing counseling - Improved relationships with family and general sense of well-being - Continues regular counseling sessions and virtual mental health visits     Past Surgical History:  Procedure Laterality Date   APPENDECTOMY     BIOPSY  05/01/2021   Procedure: BIOPSY;  Surgeon: William Claudis PENNER, MD;  Location: [facility];  Service: Endoscopy;;   CERVICAL SPINE SURGERY  2018   CHOLECYSTECTOMY     COLONOSCOPY N/A 07/23/2023   Procedure: COLONOSCOPY;   Surgeon: William Carlin POUR, DO;  Location: [facility];  Service: Endoscopy;  Laterality: N/A;  8:30am;asa 2 ileocolonoscopy   COLONOSCOPY WITH PROPOFOL  N/A 05/01/2021   Procedure: COLONOSCOPY WITH PROPOFOL ;  Surgeon: William Claudis PENNER, MD;  Location: [facility];  Service: Endoscopy;  Laterality: N/A;  1050   CYST EXCISION     CYSTOSCOPY     CYSTOSCOPY WITH RETROGRADE PYELOGRAM, URETEROSCOPY AND STENT PLACEMENT Right 08/08/2020   Procedure: CYSTOSCOPY WITH RIGHT RETROGRADE PYELOGRAM AND RIGHT URETEROSCOPY;  Surgeon: William Belvie CROME, MD;  Location: [facility];  Service: Urology;  Laterality: Right;   ESOPHAGOGASTRODUODENOSCOPY N/A 07/23/2023   Procedure: EGD (ESOPHAGOGASTRODUODENOSCOPY);  Surgeon: William Carlin POUR, DO;  [facility]: [facility];  Service: Endoscopy;  Laterality: N/A;  8:30am;asa 2   EXTRACORPOREAL SHOCK WAVE LITHOTRIPSY Right 05/21/2020   Procedure: EXTRACORPOREAL SHOCK WAVE LITHOTRIPSY (ESWL);  Surgeon: William Belvie CROME, MD;  Location: [facility];  Service: Urology;  Laterality: Right;   GASTRIC BYPASS OPEN  2003   INCISIONAL HERNIA REPAIR     kidney stone removal     KNEE ARTHROSCOPY Left    multiple   NECK SURGERY     POLYPECTOMY  05/01/2021   Procedure: POLYPECTOMY;  Surgeon: William Claudis PENNER, MD;  Location: [facility];  Service: Endoscopy;;   POSTERIOR FUSION PEDICLE SCREW PLACEMENT N/A 05/06/2023   Procedure: POSTERIOR LUMBAR INTERBODY FUSION LUMBAR FOUR-FIVE POSTERIOR LATERAL AND INTERBODY FUSION;  Surgeon: William Duncans, MD;  Location: [facility];  Service: Neurosurgery;  Laterality: N/A;   REPLACEMENT TOTAL KNEE Right    RHINOPLASTY     STONE EXTRACTION WITH BASKET Right  08/08/2020   Procedure: STONE EXTRACTION WITH BASKET;  Surgeon: William Belvie CROME, MD;  Location: [facility];  Service: Urology;  Laterality: Right;   TOTAL KNEE ARTHROPLASTY Left 03/10/2022   Procedure: TOTAL KNEE ARTHROPLASTY;  Surgeon: William Taft BRAVO, MD;  Location: [facility];  Service: Orthopedics;   Laterality: Left;      ROS    Objective:     BP 124/76   Pulse 68   Ht 5' 11 (1.803 m)   Wt 216 lb (98 kg)   SpO2 97%   BMI 30.13 kg/m  BP Readings from Last 3 Encounters:  01/24/24 124/76  12/16/23 118/70  12/15/23 117/65   Wt Readings from Last 3 Encounters:  01/24/24 216 lb (98 kg)  12/16/23 219 lb 12.8 oz (99.7 kg)  12/15/23 220 lb (99.8 kg)      Physical Exam Vitals and nursing note reviewed.  Constitutional:      Appearance: Normal appearance.  HENT:     Head: Normocephalic.  Eyes:     Extraocular Movements: Extraocular movements intact.     Pupils: Pupils are equal, round, and reactive to light.  Cardiovascular:     Rate and Rhythm: Normal rate and regular rhythm.  Pulmonary:     Effort: Pulmonary effort is normal.     Breath sounds: Normal breath sounds.  Musculoskeletal:     Cervical back: Normal range of motion and neck supple.  Neurological:     Mental Status: He is alert and oriented to person, place, and time.  Psychiatric:        Mood and Affect: Mood normal.        Thought Content: Thought content normal.    No results found for any visits on 01/24/24.    The 10-year ASCVD risk score (Arnett DK, et al., 2019) is: 8.3%    Assessment & Plan:   Problem List Items Addressed This Visit       Other   Insomnia   - Stable with Ambien  12.5 mg CR.  PDMP was reviewed.   - Continue behavioral health counseling sessions.      Chronic post-traumatic stress disorder - Primary   Improved mental health and interpersonal relationships attributed to medication adjustments and counseling. Significant improvement in quality of life. - sertraline  and lorazepam  refilled.  PDMP was reviewed.   - Continue regular counseling sessions with the behavioral health provider.      Vitamin D  deficiency   Gastrointestinal upset possibly related to over-the-counter vitamin D  supplementation. - Discontinue over-the-counter vitamin D  supplementation. - Send  prescription for once-weekly vitamin D  to pharmacy.      Relevant Medications   Vitamin D , Ergocalciferol , (DRISDOL ) 1.25 MG (50000 UNIT) CAPS capsule   Other Visit Diagnoses       Encounter for immunization       Relevant Orders   Flu vaccine trivalent PF, 6mos and older(Flulaval,Afluria,Fluarix,Fluzone) (Completed)       No follow-ups on file.    Leita Longs, FNP

## 2024-01-24 NOTE — Telephone Encounter (Signed)
 Pharmacy Patient Advocate Encounter  Received notification from [facility] that Prior Authorization for Zolpidem  Tartrate ER 12.5MG  er tablets has been APPROVED from 01/24/2024 to 01/23/2025. Ran test claim, Copay is $0.00. This test claim was processed through [facility]- copay amounts may vary at other pharmacies due to pharmacy/plan contracts, or as the patient moves through the different stages of their insurance plan.   PA #/Case ID/Reference #: [id number]

## 2024-01-26 ENCOUNTER — Other Ambulatory Visit: Payer: Self-pay

## 2024-01-26 ENCOUNTER — Ambulatory Visit (INDEPENDENT_AMBULATORY_CARE_PROVIDER_SITE_OTHER): Admitting: Clinical

## 2024-01-26 DIAGNOSIS — F331 Major depressive disorder, recurrent, moderate: Secondary | ICD-10-CM | POA: Diagnosis not present

## 2024-01-26 DIAGNOSIS — F4310 Post-traumatic stress disorder, unspecified: Secondary | ICD-10-CM

## 2024-01-26 NOTE — Progress Notes (Signed)
 IN PERSON    I connected with William Farley on 01/26/24 at  9:00 AM EDT in person and verified that I am speaking with the correct person using two identifiers.   Location: Patient: office  Provider: office    I discussed the limitations of evaluation and management by telemedicine and the availability of in person appointments. The patient expressed understanding and agreed to proceed. ( IN PERSON )   THERAPIST PROGRESS NOTE   Session Time: 9:00 AM-9:30 AM   Participation Level: Active   Behavioral Response: CasualAlertAnxious   Type of Therapy: Individual Therapy   Treatment Goals addressed: Coping Anger Management   Interventions: CBT, Motivational Interviewing, Solution Focused and Supportive   Summary: William Farley is a 63 y.o. male who presents with PTSD  and MDD with Anxiety. The OPT therapist worked with the patient for his scheduled session. The OPT therapist utilized Motivational Interviewing to assist in creating therapeutic repore.  The patient presented in a elated happier mood this session speaking to improvements through a combination of changes over the course of the past few weeks.The patient spoke about ongoing improved communication with his wife, benefit from med therapy, and being able to better control his emotions and reactive behaviors. The patients med therapy will continue to be managed by psychiatrist Shavon Rankin. The patient spoke about life change adjustments with he and his wife being retired and his involvement in caregiving for his grandson and looking forward to going camping this upcoming weekend in [place] . The OPT therapist utilized Cognitive Behavioral Therapy through cognitive restructuring as well as worked with the patient on coping strategies to assist in management of GAD. The patient continues to utlize his existing support network primarly limited to immediate family and his wife.The patient spoke about acknowledged of the importance of  consistency in maintaining protective factors to stay on the same improved trajectory. The patient noted his mental health and physical health have both been better in the last few weeks than they have been over the last few years.The OPT therapist over-viewed with the patient upcoming appointments as listed in his MyChart.   Suicidal/Homicidal: Nowithout intent/plan   Therapist Response: The OPT therapist worked with the patient for the patients scheduled session. The patient was engaged in his session and gave feedback in relation to triggers, symptoms, and behavior responses over the past few weeks.  The patient presented in a elated happier mood this session speaking to improvements through a combination of changes over the course of the past few weeks.The patient spoke about improved communication with his wife, benefit from med therapy, and being able to better control his emotions and reactive behaviors. The patients med therapy will continue to be managed by psychiatrist Shavon Rankin. The patient spoke about life change adjustments with he and his wife being retired and his involvement in caregiving for his grandson and looking forward to going camping in the upcoming weekend . The OPT therapist worked with the patient utilizing an in session Cognitive Behavioral Therapy exercise. The patient was responsive in the session and verbalized, I feel like the stars are  aligning for me.  The OPT therapist overviewed maintaining protective factors including consistency with med management, communication with his wife, utilization of his support network, spending leisure time with grandchild, and involvement in community based organization with the [employer] as key pieces to stay on the improved trajectory moving forward. The patient spoke about although the prior trauma is a factor he has been  better able to manage his PTSD and staying active.The OPT therapist worked with the patient overviewing  appointments listed in the patients MyChart. The OPT therapist will continue treatment work with the patient in his next scheduled session.     Plan: Return again in 2/3 weeks.   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   Patient/Guardian was advised Release of Information must be obtained prior to any record release in order to collaborate their care with an outside provider. Patient/Guardian was advised if they have not already done so to contact the registration department to sign all necessary forms in order for us  to release information regarding their care.    Consent: Patient/Guardian gives verbal consent for treatment and assignment of benefits for services provided during this visit. Patient/Guardian expressed understanding and agreed to proceed.    I discussed the assessment and treatment plan with the patient. The patient was provided an opportunity to ask questions and all were answered. The patient agreed with the plan and demonstrated an understanding of the instructions.   The patient was advised to call back or seek an in-person evaluation if the symptoms worsen or if the condition fails to improve as anticipated.   I provided 30 minutes of face-to-face time during this encounter.   William ONEIDA Pepper, LCSW    01/26/2024

## 2024-01-27 ENCOUNTER — Other Ambulatory Visit: Payer: Self-pay

## 2024-01-30 NOTE — Assessment & Plan Note (Addendum)
-   Stable with Ambien  12.5 mg CR.  PDMP was reviewed.   - Continue behavioral health counseling sessions.

## 2024-01-30 NOTE — Assessment & Plan Note (Addendum)
 Improved mental health and interpersonal relationships attributed to medication adjustments and counseling. Significant improvement in quality of life. - sertraline  and lorazepam  refilled.  PDMP was reviewed.   - Continue regular counseling sessions with the behavioral health provider.

## 2024-01-30 NOTE — Assessment & Plan Note (Signed)
 Gastrointestinal upset possibly related to over-the-counter vitamin D  supplementation. - Discontinue over-the-counter vitamin D  supplementation. - Send prescription for once-weekly vitamin D  to pharmacy.

## 2024-02-09 ENCOUNTER — Encounter: Payer: Self-pay | Admitting: Gastroenterology

## 2024-02-09 ENCOUNTER — Encounter: Payer: Self-pay | Admitting: *Deleted

## 2024-02-09 ENCOUNTER — Other Ambulatory Visit: Payer: Self-pay | Admitting: *Deleted

## 2024-02-09 ENCOUNTER — Ambulatory Visit: Admitting: Gastroenterology

## 2024-02-09 VITALS — BP 127/73 | HR 68 | Temp 98.2°F | Ht 71.0 in | Wt 214.0 lb

## 2024-02-09 DIAGNOSIS — R161 Splenomegaly, not elsewhere classified: Secondary | ICD-10-CM | POA: Diagnosis not present

## 2024-02-09 DIAGNOSIS — R1011 Right upper quadrant pain: Secondary | ICD-10-CM

## 2024-02-09 DIAGNOSIS — K760 Fatty (change of) liver, not elsewhere classified: Secondary | ICD-10-CM

## 2024-02-09 DIAGNOSIS — R197 Diarrhea, unspecified: Secondary | ICD-10-CM

## 2024-02-09 DIAGNOSIS — K219 Gastro-esophageal reflux disease without esophagitis: Secondary | ICD-10-CM

## 2024-02-09 NOTE — Progress Notes (Unsigned)
 Gastroenterology Office Note     Primary Care Physician:  Gasper Collar, FNP  Primary Gastroenterologist: Dr Cindie   Chief Complaint   Chief Complaint  Patient presents with   Diarrhea    Pt arrives to discuss diarrhea. Has been ongoing issues. Has had IBS for a while. No medication seems to work with IBS. Still having pain on right side.      History of Present Illness   William Farley is a 63 y.o. male presenting today with a history of chronic GERD, chronic diarrhea intermittently, chronic-right sided abdominal pain over 30 years despite prior cholecystectomy, rectal bleeding due to internal hemorrhoids s/p banding, hepatic steatosis, returning in follow-up.     Diarrhea: prior to gastric bypass in 2003. Previously tried dicyclomine , Zenpep  not covered last year but had some mild improvement,. Diarrhea every day, usually a few times a day. Diagnosed with IBS in the [employer]. Diet can affect. Sometimes flaky stool, mostly liquid. Cow patty. Trying to eat better. If eats better, has better form. Doesn't want other meds for diarrhea right now and feels like this is not his biggest concern.   Chronic right-sided pain, twisting. Present prior to cholecystectomy. Feels like twhat he describes as torsion testicle feel, twisting, burning. Always underlying, will wax and wane, no relieving with bowel movements. Wants to avoid polypharmacy but if needs something will do it. Does not appear he has had MRI. States he has had this evaluated many time sin the past.   Omeprazole once daily.     Hep C antibody negative in March 2024.  Upon review of labs, he had just a mild increase in bilirubin in April 2025 at 1.3.  AST was 31, ALT 18, alk phos 98.  Previously, his transaminases have been less than 30.  Bilirubin has been normal historically. Celiac serologies negative.    ELF 10.26. has seen Hematology in the past outside the state due to splenomegaly.    EGD May 2025: esophageal  mucosal changes consistent with barrett's, normal appearing GJ anastomosis, normal jejunum. Small hiatal hernia.    colonoscopy May 2025 with non-bleeding internal hemorrhoids, diverticulosis, normal examined TI, random colonic biopsies unremarkable.  Past Medical History:  Diagnosis Date   Anxiety    Arthritis    CAD (coronary artery disease)    Minimal Non-obstructive CAD seen on coronary CTA 04/2021   Depression    GERD (gastroesophageal reflux disease)    History of kidney stones    History of thrombocytopenia    Hx of syncope    Hypoglycemia    PAF (paroxysmal atrial fibrillation) (HCC)    ChadsVasc Score is 0.  Monitor revealed less than 1% PAF burden.   Seizure (HCC) 02/2021   Sleep apnea    wears CPAP     Past Surgical History:  Procedure Laterality Date   APPENDECTOMY     BIOPSY  05/01/2021   Procedure: BIOPSY;  Surgeon: [name], MD;  [facility]: [facility];  Service: Endoscopy;;   CERVICAL SPINE SURGERY  2018   CHOLECYSTECTOMY     COLONOSCOPY N/A 07/23/2023   Procedure: COLONOSCOPY;  Surgeon: [name], DO;  [facility]: [facility];  Service: Endoscopy;  Laterality: N/A;  8:30am;asa 2 ileocolonoscopy   COLONOSCOPY WITH PROPOFOL  N/A 05/01/2021   Procedure: COLONOSCOPY WITH PROPOFOL ;  Surgeon: [name], MD;  [facility]: [facility];  Service: Endoscopy;  Laterality: N/A;  1050   CYST EXCISION     CYSTOSCOPY  CYSTOSCOPY WITH RETROGRADE PYELOGRAM, URETEROSCOPY AND STENT PLACEMENT Right 08/08/2020   Procedure: CYSTOSCOPY WITH RIGHT RETROGRADE PYELOGRAM AND RIGHT URETEROSCOPY;  Surgeon: [name], MD;  Location: [facility];  Service: Urology;  Laterality: Right;   ESOPHAGOGASTRODUODENOSCOPY N/A 07/23/2023   Procedure: EGD (ESOPHAGOGASTRODUODENOSCOPY);  Surgeon: [name], DO;  [facility];  Service: Endoscopy;  Laterality: N/A;  8:30am;asa 2   EXTRACORPOREAL SHOCK WAVE LITHOTRIPSY Right 05/21/2020   Procedure: EXTRACORPOREAL  SHOCK WAVE LITHOTRIPSY (ESWL);  Surgeon: [name], MD;  Location: [facility];  Service: Urology;  Laterality: Right;   GASTRIC BYPASS OPEN  2003   INCISIONAL HERNIA REPAIR     kidney stone removal     KNEE ARTHROSCOPY Left    multiple   NECK SURGERY     POLYPECTOMY  05/01/2021   Procedure: POLYPECTOMY;  Surgeon: [name], MD;  [facility];  Service: Endoscopy;;   POSTERIOR FUSION PEDICLE SCREW PLACEMENT N/A 05/06/2023   Procedure: POSTERIOR LUMBAR INTERBODY FUSION LUMBAR FOUR-FIVE POSTERIOR LATERAL AND INTERBODY FUSION;  Surgeon: [name], MD;  Location: [facility];  Service: Neurosurgery;  Laterality: N/A;   REPLACEMENT TOTAL KNEE Right    RHINOPLASTY     STONE EXTRACTION WITH BASKET Right 08/08/2020   Procedure: STONE EXTRACTION WITH BASKET;  Surgeon: [name], MD;  Location: [facility];  Service: Urology;  Laterality: Right;   TOTAL KNEE ARTHROPLASTY Left 03/10/2022   Procedure: TOTAL KNEE ARTHROPLASTY;  Surgeon: [name], MD;  Location: [facility];  Service: Orthopedics;  Laterality: Left;    Current Outpatient Medications  Medication Sig Dispense Refill   allopurinol  (ZYLOPRIM ) 300 MG tablet Take 1 tablet (300 mg total) by mouth daily. 90 tablet 3   LORazepam  (ATIVAN ) 1 MG tablet Take 1 tablet (1 mg total) by mouth every 8 (eight) hours as needed for anxiety. 90 tablet 0   metoprolol  succinate (TOPROL -XL) 25 MG 24 hr tablet Take 0.5 tablets (12.5 mg total) by mouth daily.     omeprazole (PRILOSEC) 20 MG capsule Take 20 mg by mouth in the morning.     sertraline  (ZOLOFT ) 100 MG tablet Take 1 tablet (100 mg total) by mouth daily. 90 tablet 3   tamsulosin  (FLOMAX ) 0.4 MG CAPS capsule Take 1 capsule (0.4 mg total) by mouth in the morning and at bedtime. 180 capsule 3   Vitamin D , Ergocalciferol , (DRISDOL ) 1.25 MG (50000 UNIT) CAPS capsule Take 1 capsule (50,000 Units total) by mouth every 7 (seven) days. 5 capsule 11   zolpidem  (AMBIEN  CR) 12.5 MG CR  tablet Take 1 tablet (12.5 mg total) by mouth at bedtime. 30 tablet 2   No current facility-administered medications for this visit.    Allergies as of 02/09/2024 - Review Complete 02/09/2024  Allergen Reaction Noted   Codeine Itching, Rash, and Other (See Comments) 03/11/2012    Family History  Problem Relation Age of Onset   Heart disease Father 14   Kidney failure Father    Colon cancer Neg Hx     Social History   Socioeconomic History   Marital status: Married    Spouse name: Not on file   Number of children: Not on file   Years of education: Not on file   Highest education level: Associate degree: occupational, scientist, product/process development, or vocational program  Occupational History   Occupation: economist  Tobacco Use   Smoking status: Former    Current packs/day: 0.00    Average packs/day: 0.5  packs/day for 10.0 years (5.0 ttl pk-yrs)    Types: Cigarettes    Start date: 05/20/1986    Quit date: 05/20/1996    Years since quitting: 27.7    Passive exposure: Past   Smokeless tobacco: Never  Vaping Use   Vaping status: Never Used  Substance and Sexual Activity   Alcohol use: Not Currently   Drug use: Yes    Types: Marijuana    Comment: daily   Sexual activity: Yes  Other Topics Concern   Not on file  Social History Narrative   Right handed    Married wife   One story home   Social Drivers of Health   Financial Resource Strain: Low Risk  (01/20/2024)   Overall Financial Resource Strain (CARDIA)    Difficulty of Paying Living Expenses: Not hard at all  Food Insecurity: No Food Insecurity (01/20/2024)   Hunger Vital Sign    Worried About Running Out of Food in the Last Year: Never true    Ran Out of Food in the Last Year: Never true  Transportation Needs: No Transportation Needs (01/20/2024)   PRAPARE - Administrator, Civil Service (Medical): No    Lack of Transportation (Non-Medical): No  Physical Activity: Insufficiently Active (01/20/2024)    Exercise Vital Sign    Days of Exercise per Week: 2 days    Minutes of Exercise per Session: 30 min  Stress: No Stress Concern Present (01/20/2024)   [employer] of Occupational Health - Occupational Stress Questionnaire    Feeling of Stress: Only a little  Social Connections: Socially Integrated (01/20/2024)   Social Connection and Isolation Panel    Frequency of Communication with Friends and Family: More than three times a week    Frequency of Social Gatherings with Friends and Family: Twice a week    Attends Religious Services: More than 4 times per year    Active Member of [employer] or Organizations: Yes    Attends Engineer, Structural: More than 4 times per year    Marital Status: Married  Catering Manager Violence: Not At Risk (03/10/2022)   Humiliation, Afraid, Rape, and Kick questionnaire    Fear of Current or Ex-Partner: No    Emotionally Abused: No    Physically Abused: No    Sexually Abused: No     Review of Systems   Gen: Denies any fever, chills, fatigue, weight loss, lack of appetite.  CV: Denies chest pain, heart palpitations, peripheral edema, syncope.  Resp: Denies shortness of breath at rest or with exertion. Denies wheezing or cough.  GI: Denies dysphagia or odynophagia. Denies jaundice, hematemesis, fecal incontinence. GU : Denies urinary burning, urinary frequency, urinary hesitancy MS: Denies joint pain, muscle weakness, cramps, or limitation of movement.  Derm: Denies rash, itching, dry skin Psych: Denies depression, anxiety, memory loss, and confusion Heme: Denies bruising, bleeding, and enlarged lymph nodes.   Physical Exam   BP 127/73   Pulse 68   Temp 98.2 F (36.8 C)   Ht 5' 11 (1.803 m)   Wt 214 lb (97.1 kg) Comment: pt reported  BMI 29.85 kg/m  General:   Alert and oriented. Pleasant and cooperative. Well-nourished and well-developed.  Head:  Normocephalic and atraumatic. Eyes:  Without icterus Abdomen:  +BS, soft, mild TTP  RUQ, +Carnett's sign and non-distended. No HSM noted. No guarding or rebound. No masses appreciated.  Rectal:  Deferred  Msk:  Symmetrical without gross deformities. Normal posture. Extremities:  Without edema. Neurologic:  Alert and  oriented x4;  grossly normal neurologically. Skin:  Intact without significant lesions or rashes. Psych:  Alert and cooperative. Normal mood and affect.   Assessment   MARIAH HARN is a 63 y.o. male presenting today with a history of chronic GERD, chronic diarrhea intermittently, chronic-right sided abdominal pain over 30 years despite prior cholecystectomy, rectal bleeding due to internal hemorrhoids s/p banding, hepatic steatosis, returning in follow-up.   Diarrhea: does not want to address this further today. Chronic.   GERD: controlled on omeprazole daily.  Chronic RUQ abdominal pain: ongoing, even prior to cholecystectomy, MRI to be done in future as appears this has not been completed in the past.  Hepatic steatosis: Hep C antibody negative in March 2024.  Upon review of labs, he had just a mild increase in bilirubin in April 2025 at 1.3.  AST was 31, ALT 18, alk phos 98.  Previously, his transaminases have been less than 30.  Bilirubin has been normal historically. Celiac serologies negative. ELF 10.26. has seen Hematology in the past outside the state due to splenomegaly. Splenomegaly is chronic, no thrombocytopenia. Refer to Hematology to be thorough. Suspect may be due to underlying fibrosis. No portal hypertension stigmata.      PLAN    MRI/MRCP Referral to Hematology Consider TCA Rezdiffra to be determined   Therisa MICAEL Stager, PhD, ANP-BC [facility] G[facility]

## 2024-02-09 NOTE — Progress Notes (Signed)
 Information sent to [facility] regarding referral to hematology.

## 2024-02-09 NOTE — Patient Instructions (Signed)
 I am ordering an MRI for further evaluation!  We are referring you to Hematology due to chronically enlarged spleen.  Further recommendations following this!  I enjoyed seeing you again today! I value our relationship and want to provide genuine, compassionate, and quality care. You may receive a survey regarding your visit with me, and I welcome your feedback! Thanks so much for taking the time to complete this. I look forward to seeing you again.      William MICAEL Stager, PhD, ANP-BC [facility] Gastroenterology

## 2024-02-10 ENCOUNTER — Telehealth (HOSPITAL_COMMUNITY): Payer: Self-pay | Admitting: *Deleted

## 2024-02-10 ENCOUNTER — Encounter: Payer: Self-pay | Admitting: Nurse Practitioner

## 2024-02-10 ENCOUNTER — Ambulatory Visit (HOSPITAL_COMMUNITY): Admitting: Registered Nurse

## 2024-02-10 ENCOUNTER — Ambulatory Visit: Attending: Nurse Practitioner | Admitting: Nurse Practitioner

## 2024-02-10 ENCOUNTER — Encounter (HOSPITAL_COMMUNITY): Payer: Self-pay | Admitting: Registered Nurse

## 2024-02-10 VITALS — BP 140/80 | HR 67 | Ht 71.0 in | Wt 213.5 lb

## 2024-02-10 DIAGNOSIS — Z79899 Other long term (current) drug therapy: Secondary | ICD-10-CM

## 2024-02-10 DIAGNOSIS — F4312 Post-traumatic stress disorder, chronic: Secondary | ICD-10-CM

## 2024-02-10 DIAGNOSIS — Z87898 Personal history of other specified conditions: Secondary | ICD-10-CM | POA: Diagnosis not present

## 2024-02-10 DIAGNOSIS — F5104 Psychophysiologic insomnia: Secondary | ICD-10-CM

## 2024-02-10 DIAGNOSIS — I480 Paroxysmal atrial fibrillation: Secondary | ICD-10-CM

## 2024-02-10 DIAGNOSIS — G4733 Obstructive sleep apnea (adult) (pediatric): Secondary | ICD-10-CM

## 2024-02-10 DIAGNOSIS — I2510 Atherosclerotic heart disease of native coronary artery without angina pectoris: Secondary | ICD-10-CM

## 2024-02-10 DIAGNOSIS — F411 Generalized anxiety disorder: Secondary | ICD-10-CM

## 2024-02-10 DIAGNOSIS — R001 Bradycardia, unspecified: Secondary | ICD-10-CM | POA: Diagnosis not present

## 2024-02-10 MED ORDER — BUSPIRONE HCL 5 MG PO TABS: 5.0000 mg | ORAL_TABLET | Freq: Three times a day (TID) | ORAL | 1 refills | Status: DC

## 2024-02-10 MED ORDER — LORAZEPAM 0.5 MG PO TABS: ORAL_TABLET | ORAL | 0 refills | Status: DC

## 2024-02-10 MED ORDER — SERTRALINE HCL 100 MG PO TABS: 100.0000 mg | ORAL_TABLET | Freq: Every day | ORAL | 1 refills | Status: DC

## 2024-02-10 MED ORDER — GABAPENTIN 100 MG PO CAPS: 100.0000 mg | ORAL_CAPSULE | Freq: Three times a day (TID) | ORAL | 0 refills | Status: DC | PRN

## 2024-02-10 MED ORDER — GABAPENTIN 100 MG PO CAPS: 100.0000 mg | ORAL_CAPSULE | Freq: Three times a day (TID) | ORAL | 1 refills | Status: DC | PRN

## 2024-02-10 MED ORDER — ZOLPIDEM TARTRATE ER 12.5 MG PO TBCR: 12.5000 mg | EXTENDED_RELEASE_TABLET | Freq: Every day | ORAL | 2 refills | Status: DC

## 2024-02-10 NOTE — Telephone Encounter (Signed)
 [facility] stating they are needing the script to be resent back to them for [name] .    Per [facility], the script starts at week 2 of the taper and there is no week 1.   They are needing week 1 to be added to the script and resent to them.   Per [facility], they can only do a 30 day prescription at a time even if provider is trying to taper patient off of medication. Which will make it to where patient will have extra left over.   Per pharmacy they are not able to dispense med to patient for his taper off without a week one instructions.   [phone] ext [phone]
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
## 2024-02-10 NOTE — Progress Notes (Signed)
 Cardiology Office Note:    Date:  02/10/2024 ID:  William Farley, DOB 06/22/1960, MRN [id number] PCP:  Gasper Collar, FNP [facility] Providers Cardiologist:  Jayson Sierras, MD    Referring MD: Bevely Doffing, FNP   CC: Here for 6-8 week follow-up appointment   History of Present Illness:    William Farley is a 63 y.o. male with a PMH of CAD, PAF, previous history of syncope and collapse, history of seizure disorder, history of thrombocytopenia, anxiety, chronic PTSD, who presents today for 6-8 week follow-up appointment.  In the past he was evaluated for syncopal event in 02/2021 that was attributed to his seizure,  got up to go to the bathroom and passed out, wife noted seizure-like activity. Workup at [facility] in [place] was overall negative, troponins negative, EKG negative.  CT scan and MRI of brain, normal.  EEG was normal.  Echocardiogram revealed normal LV function. Monitor revealed less than 1% burden of PAF, had multiple brief episodes of SVT.  Dr. Sierras who reviewed monitor was hesitant to start anticoagulation without further information.   Last seen by Dr. Barbaraann in January 2023.  Pt stated he did not feel any A-fib and denied any tachycardia or palpitations.  Denied any chest pain or shortness of breath surrounding syncopal event, but did note intermittent SHOB, that was thought to be anxiety related. CHA2DS2-VASc score 0.  Etiology unclear for syncopal event, denied any recurrences of syncope.  Started on Toprol  XL 25 mg daily to prevent any fast heart rates.  Underwent coronary CTA that revealed coronary calcium score 19, 50th percent for his demographic, nonobstructive CAD with calcified plaque in proximal LAD causing minimal (0 to 24%) stenosis.  Was told to follow-up in 6 months.  I last saw him for follow-up on February 19, 2022.  He was pending future left total knee arthroplasty at this time.  Was overall doing well.    06/10/2023 - Today presents for  follow-up.  Admits to fatigue, partly attributes this to his episodes of hypoglycemia.  Says he is no longer on CGM.  Says he takes a nightly meal to help prevent symptoms at night.  He is being followed by endocrinology for this. Denies any chest pain, shortness of breath, palpitations, syncope, presyncope, dizziness, orthopnea, PND, swelling or significant weight changes, acute bleeding, or claudication.  Does not check his BP at home but says BP readings at office visits have been lower recently.  12/16/2023 - Here for follow-up.  Does tell me he has noticed some low heart rate readings on his Apple watch noted today.  Does feel sometimes sluggish, attributes this to history of malnutrition with low blood sugars in the past, denies any syncope.  Says he is feeling better due to eating more protein.  Shows me Apple watch readings on his phone that show lowest heart rate reading seen around 44-45. Denies any chest pain, shortness of breath, palpitations, syncope, presyncope, dizziness, orthopnea, PND, swelling or significant weight changes, acute bleeding, or claudication.  02/10/2024 - Here for follow-up. Overall doing well from a cardiac perspective.  Continues to note some lower heart rates on his Apple watch and wants to know what seems to be causing this.  He does have sleep apnea but does not wear his CPAP machine as he presents concerns of past reports of lung cancer from [employer] brand CPAP and past recall. Denies any chest pain, shortness of breath, palpitations, syncope, presyncope, dizziness, orthopnea, PND, swelling or significant  weight changes, acute bleeding, or claudication.   SH: Officially retired from the [employer]. Former smoker, does occasionally smoke weed for hx of PTSD, says his providers are aware about this. He is [employer].   Please see the history of present illness.    All other systems reviewed and are negative.  EKGs/Labs/Other Studies Reviewed:    The following  studies were reviewed today:   EKG:  EKG is not ordered today.   Cardiac monitor 12/2023:  ZIO AT reviewed.  13 days, 21 hours analyzed.   Predominant rhythm is sinus with heart rate ranging from 42 bpm up to 137 bpm with average heart rate 65 bpm. There were rare PACs including atrial couplets and triplets representing less than 1% total beats. There were rare PVCs including ventricular couplets and triplets representing less than 1% total beats. Single 4 beat episode of NSVT noted.  No sustained ventricular arrhythmias. Several (22) brief runs of PSVT were noted, the longest of which was 19 beats.  These were probable atrial tachycardia.  No sustained atrial arrhythmias. No prolonged pauses or high degree heart block.  Echo 06/2023:   1. Left ventricular ejection fraction, by estimation, is 60 to 65%. Left  ventricular ejection fraction by 3D volume is 62 %. The left ventricle has  normal function. The left ventricle has no regional wall motion  abnormalities. Left ventricular diastolic   parameters were normal. The average left ventricular global longitudinal  strain is -21.5 %. The global longitudinal strain is normal.   2. Right ventricular systolic function is normal. The right ventricular  size is normal. There is normal pulmonary artery systolic pressure.   3. The mitral valve is normal in structure. Mild mitral valve  regurgitation. No evidence of mitral stenosis.   4. The aortic valve is tricuspid. Aortic valve regurgitation is not  visualized. No aortic stenosis is present.   5. The inferior vena cava is normal in size with greater than 50%  respiratory variability, suggesting right atrial pressure of 3 mmHg.   Comparison(s): A prior study was performed on 03/13/2021. No significant  change from prior study.  MRI of brain on May 24, 2021: Normal brain MRI.  Coronary CTA on April 30, 2021: 1. Coronary calcium score of 19. This was 50th percentile for age and sex  matched control. 2.  Normal coronary origin with right dominance. 3. Nonobstructive CAD, with calcified plaque in proximal LAD causing minimal (0-24%) stenosis. CAD-RADS 1. Minimal non-obstructive CAD (0-24%). Consider non-atherosclerotic causes of chest pain. Consider preventive therapy and risk factor modification.  4.  No suspicious nodules, masses, or infiltrates are identified in the visualized portion of the lungs.  Mild scarring noted in both lung bases.  No pleural fluid seen 5.  Prior gastric bypass surgery is noted, with a small hiatal hernia.  14-day ZIO monitor on April 03, 2021: Predominant rhythm is sinus with heart rate ranging from 47 bpm up to 140 bpm and average heart rate 77 bpm. There were rare PACs including couplets and triplets representing less than 1% total beats. There were rare PVCs including couplets representing less than 1% total beats.   Multiple, brief episodes of SVT were noted.  An episode of sustained atrial fibrillation with RVR was also noted on December 24 with heart rate in the 150s on average. Atrial fibrillation represented less than 1% total rhythm burden however.   There were no pauses.  2D echocardiogram on March 13, 2021:  1. Left ventricular ejection  fraction, by estimation, is 65 to 70%. The  left ventricle has normal function. The left ventricle has no regional  wall motion abnormalities. The left ventricular internal cavity size was  mildly dilated. Left ventricular  diastolic parameters were normal.   2. Right ventricular systolic function is normal. The right ventricular  size is normal. There is normal pulmonary artery systolic pressure.   3. The mitral valve is normal in structure. Trivial mitral valve  regurgitation.   4. The aortic valve is tricuspid. Aortic valve regurgitation is not  visualized.  Risk Assessment/Calculations:      The 10-year ASCVD risk score (Arnett DK, et al., 2019) is: 10.2%   Values used to calculate  the score:     Age: 58 years     Clincally relevant sex: Male     Is Non-Hispanic African American: No     Diabetic: No     Tobacco smoker: No     Systolic Blood Pressure: 140 mmHg     Is BP treated: Yes     HDL Cholesterol: 56.6 mg/dL     Total Cholesterol: 172 mg/dL  Physical Exam:    VS:  BP (!) 140/80   Pulse 67   Ht 5' 11 (1.803 m)   Wt 213 lb 8 oz (96.8 kg)   SpO2 96%   BMI 29.78 kg/m     Wt Readings from Last 3 Encounters:  02/10/24 213 lb 8 oz (96.8 kg)  02/09/24 214 lb (97.1 kg)  01/24/24 216 lb (98 kg)    GEN: Well developed and well nourished, 63 y.o. male in no acute distress HEENT: Normal NECK: No JVD; No carotid bruits CARDIAC: S1/S2, RRR, no murmurs, rubs, gallops; 2+ peripheral pulses throughout, strong and equal bilaterally RESPIRATORY:  Clear to auscultation without rales, wheezing or rhonchi  MUSCULOSKELETAL:  No edema; No deformity  SKIN: Warm and dry NEUROLOGIC:  Alert and oriented x 3 PSYCHIATRIC:  Normal, pleasant affect   ASSESSMENT & PLAN:    In order of problems listed above:  1. Minimal, non-obstructive CAD Coronary CTA in February 2023 revealed coronary calcium score 19, 50th percentile for his demographic.  Nonobstructive CAD with calcified plaque noted in proximal LAD causing minimal stenosis. Stable with no anginal symptoms. No indication for ischemic evaluation. PCP to manage lipids. Last lipid panel in 03/2023 was overall unremarkable. Continue current medication regimen. Heart healthy diet and regular cardiovascular exercise encouraged.    3. PAF, bradycardia Denies any tachycardia or palpitations. Reviewed most recent monitor report above, did not show evidence of A-fib. Does admit to some low heart rate readings, overall asymptomatic with this, most likely related to his untreated OSA currently.  CHA2DS2-VASc score 0.  Because CHA2DS2-VASc score is 0, he does not require AC therapy. Heart healthy diet and regular cardiovascular exercise  as tolerated encouraged.  No medication changes at this time.  Care and ED precautions discussed.    4. Hx of syncope Denies any recurrent syncope, presyncope or any dizziness. Etiology for past syncopeal event unclear. Continue to follow with PCP. Heart healthy diet and regular cardiovascular exercise encouraged.   5. OSA Not currently on CPAP.  [facility] to [facility] with the [facility].  He verbalized understanding.  Disposition: Care and ED precautions discussed.  Follow-up with Dr. Debera or APP in 1 year or sooner if anything changes.   I spent a total duration of 25 minutes reviewing prior notes, reviewing outside records including  labs, face-to-face counseling of medical  condition, pathophysiology, evaluation, management, and documenting the findings in the note.    Medication Adjustments/Labs and Tests Ordered: Current medicines are reviewed at length with the patient today.  Concerns regarding medicines are outlined above.  No orders of the defined types were placed in this encounter.  No orders of the defined types were placed in this encounter.   Patient Instructions  Medication Instructions:   Continue all current medications.   Labwork:  none  Testing/Procedures:  none  Follow-Up:  Your physician wants you to follow up in:  1 year.  You should receive a recall letter in the mail about 2 months prior to the time you are due.  If you don't receive this, please call our office to schedule your follow up appointment.      Any Other Special Instructions Will Be Listed Below (If Applicable).   If you need a refill on your cardiac medications before your next appointment, please call your pharmacy.    Signed, Almarie Crate, NP  02/10/2024 4:08 PM    [facility]

## 2024-02-10 NOTE — Patient Instructions (Addendum)

## 2024-02-10 NOTE — Patient Instructions (Addendum)
 If no one has contacted, you by the end of business day today please call the appropriate office listed below to schedule your next visit for medication management with Luisa Ruder, NP:    [facility] [address], [place], [place] [address]  5.4 mi Phone: [phone] (Call to schedule appointment)  [facility] [address] [place], [place] [address]  25 mi Phone: [phone] (Call to schedule appointment)   Ativan  Taper instructions:  You are currently prescribed Ativan  1 mg every 8 hours as needed.  Use this to start your Ativan  taper.   Prescribed 1 mg tablet:  Week 1:  Take Ativan  1 mg (1 tablet) twice daily for 7 days    A prescription for Ativan  0.5 mg sent to pharmacy to start Week 2 thru week 5 of Ativan  taper. Prescribed 0.5 mg tablet Week 2:  Ativan  0.5 mg (1 tablet) twice daily for 7 days.  Week 3:  Take Ativan  0.5 mg (1 tablet) daily for 7 days.  Week 4:  Take Ativan  0.25 mg (1/2 tablet) daily for 7 days.  Week 5:  Take Ativan  0.25 mg (1/2 tablet) every other day for 7 days then stop/discontinue.     You may start taking Gabapentin 100 mg (to help with breakthrough anxiety) and Buspar 5 mg (to replace Ativan  once taper is completed) during your Ativan  taper.    You have been started on more than one new medication. Please do not take them at the same time initially. If an adverse reaction occurs, this will help identify which medication is responsible.  Long-term use of benzodiazepines can lead to several adverse effects, including: Cognitive Impairment: Issues such as memory problems, decreased attention, and impaired cognitive abilities. Physical Health Risks: Increased risk of falls and fractures, especially in older adults.  Mental Health Issues: Mood swings, depression, and increased anxiety.  Dependence and Withdrawal: Physical dependence can develop,  leading to withdrawal symptoms like irritability, sleep disturbances, and flu-like symptoms.  Social and Occupational Impact: Problems with employment and social interactions.  Gradual reduction under medical supervision is recommended to mitigate withdrawal symptoms.  Alternatives There are several alternatives to benzodiazepines for managing anxiety and related conditions. Some of the common options include: Selective Serotonin Reuptake Inhibitors (SSRIs): These medications, such as sertraline  and fluoxetine, are often used as first-line treatments for anxiety disorders.  Serotonin-Norepinephrine Reuptake Inhibitors (SNRIs): Examples include venlafaxine and duloxetine , which are also effective for anxiety.  Buspirone (Buspar): This is a non-benzodiazepine medication specifically for anxiety, with a lower risk of dependence.  Beta-Blockers: Medications like propranolol can help manage physical symptoms of anxiety, such as rapid heartbeat.  Pregabalin  and Gabapentin: These are used for anxiety and have a different mechanism of action compared to benzodiazepines.  Hydroxyzine: An antihistamine that can be used for anxiety.    Call 911, [phone], mobile crisis, or present to the nearest emergency room should you experience any suicidal/homicidal ideation, auditory/visual/hallucinations, or detrimental worsening of your mental health.  Mobile Crisis Response Teams Listed by counties in vicinity of [facility] providers [facility] ([facility]) [facility] [phone] [facility] [phone] [place] [facility] [phone] [facility] [phone] [place]                * [facility] [phone]                *  [facility] [phone]  [facility] [phone] [facility] [employer]   [phone] * [facility] [phone]

## 2024-02-10 NOTE — Telephone Encounter (Signed)
 Patient called stating his pharmacy never received the Buspirone (Buspar). Per patient chart, it looks like patient medication was sent to the wrong pharmacy. Per pt, he would like his script to be sent to [facility] off [address] in [place].

## 2024-02-11 ENCOUNTER — Encounter: Payer: Self-pay | Admitting: Orthopedic Surgery

## 2024-02-11 ENCOUNTER — Other Ambulatory Visit (INDEPENDENT_AMBULATORY_CARE_PROVIDER_SITE_OTHER): Payer: Self-pay

## 2024-02-11 ENCOUNTER — Ambulatory Visit (INDEPENDENT_AMBULATORY_CARE_PROVIDER_SITE_OTHER): Admitting: Orthopedic Surgery

## 2024-02-11 DIAGNOSIS — Z96652 Presence of left artificial knee joint: Secondary | ICD-10-CM

## 2024-02-11 DIAGNOSIS — M1712 Unilateral primary osteoarthritis, left knee: Secondary | ICD-10-CM

## 2024-02-11 DIAGNOSIS — Z96651 Presence of right artificial knee joint: Secondary | ICD-10-CM

## 2024-02-11 NOTE — Telephone Encounter (Signed)
 Pt called in stating that he has some no expired 10mg  buspar  tablets that he will cut and in and wait for medication to be filled

## 2024-02-11 NOTE — Progress Notes (Signed)
    02/11/2024   Chief Complaint  Patient presents with   Post-op Follow-up    L TKR 03/10/24     Encounter Diagnoses  Name Primary?   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 __[facility]_________________________  DOI/DOS/ Date:  03/10/22  Did you get better, worse or no change (Answer below)   Improved

## 2024-02-11 NOTE — Progress Notes (Signed)
 ANNUAL FOLLOW UP FOR  LEFT  TKA   Chief Complaint  Patient presents with   Post-op Follow-up    L TKR 03/10/22     HPI: The patient is here for the annual  follow-up x-ray for knee replacement. The patient is not complaining of pain weakness instability or stiffness in the repaired knee.   ROS - he c/o the knee feeling spongy      Examination of the LEFT  KNEE  There were no vitals taken for this visit. General the patient is normally groomed in no distress Inspection shows : incision healed nicely without erythema, no tenderness no swelling Range of motion total range of motion is +2-130 Stability the knee is stable anterior to posterior as well as medial to lateral Strength quadriceps strength is normal Skin no erythema around the skin incision Neuro: normal sensation in the operative leg  Gait: normal expected gait without cane    Medical decision-making section X-rays ordered, internal imaging shows (see full dictated report) stable implant with no signs of loosening  Diagnosis  Encounter Diagnoses  Name Primary?   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

## 2024-02-14 MED ORDER — LORAZEPAM 0.5 MG PO TABS: ORAL_TABLET | ORAL | 0 refills | Status: DC

## 2024-02-14 MED ORDER — BUSPIRONE HCL 5 MG PO TABS: 5.0000 mg | ORAL_TABLET | Freq: Two times a day (BID) | ORAL | 0 refills | Status: DC

## 2024-02-14 NOTE — Addendum Note (Signed)
 Addended by: Aryanne Gilleland B on: 02/14/2024 09:03 AM   Modules accepted: Orders

## 2024-02-14 NOTE — Addendum Note (Signed)
 Addended by: Kateleen Encarnacion B on: 02/14/2024 05:05 PM   Modules accepted: Orders

## 2024-02-14 NOTE — Telephone Encounter (Signed)
Informed patient with what provider stated and he verbalized understanding.  °

## 2024-02-14 NOTE — Telephone Encounter (Signed)
 Pharmacy is stating they are needing a new script with instructions stated resent to them. They can not fill patient medication until that new script is sent to them.

## 2024-02-16 NOTE — Telephone Encounter (Signed)
 noted

## 2024-02-21 ENCOUNTER — Ambulatory Visit: Payer: Federal, State, Local not specified - PPO | Admitting: Orthopedic Surgery

## 2024-02-21 ENCOUNTER — Other Ambulatory Visit: Payer: Self-pay | Admitting: Gastroenterology

## 2024-02-21 ENCOUNTER — Ambulatory Visit (HOSPITAL_COMMUNITY)
Admission: RE | Admit: 2024-02-21 | Discharge: 2024-02-21 | Disposition: A | Discharge status: Home / Self Care | Source: Ambulatory Visit | Attending: Gastroenterology | Admitting: Gastroenterology

## 2024-02-21 DIAGNOSIS — R1011 Right upper quadrant pain: Secondary | ICD-10-CM | POA: Insufficient documentation

## 2024-02-21 MED ORDER — GADOBUTROL 1 MMOL/ML IV SOLN
9.0000 mL | Freq: Once | INTRAVENOUS | Status: AC | PRN
  Administered 2024-02-21: 9 mL via INTRAVENOUS

## 2024-02-24 ENCOUNTER — Ambulatory Visit

## 2024-03-01 ENCOUNTER — Ambulatory Visit (INDEPENDENT_AMBULATORY_CARE_PROVIDER_SITE_OTHER): Admitting: Clinical

## 2024-03-01 DIAGNOSIS — F419 Anxiety disorder, unspecified: Secondary | ICD-10-CM

## 2024-03-01 DIAGNOSIS — F4310 Post-traumatic stress disorder, unspecified: Secondary | ICD-10-CM

## 2024-03-01 NOTE — Progress Notes (Signed)
 IN PERSON    I connected with William Farley on 03/01/24 at  9:00 AM EDT in person and verified that I am speaking with the correct person using two identifiers.   Location: Patient: office  Provider: office    I discussed the limitations of evaluation and management by telemedicine and the availability of in person appointments. The patient expressed understanding and agreed to proceed. ( IN PERSON )   THERAPIST PROGRESS NOTE   Session Time: 9:00 AM-9:30 AM   Participation Level: Active   Behavioral Response: CasualAlertAnxious   Type of Therapy: Individual Therapy   Treatment Goals addressed: Coping Anger Management   Interventions: CBT, Motivational Interviewing, Solution Focused and Supportive   Summary: William Farley is a 63 y.o. male who presents with PTSD  and MDD with Anxiety. The OPT therapist worked with the patient for his scheduled session. The OPT therapist utilized Motivational Interviewing to assist in creating therapeutic repore.  The patient presented in a elated happier mood this session speaking to improvements through a combination of changes over the course of the past few weeks.The patient spoke about ongoing improved communication with his wife, benefit from med therapy, and being able to better control his emotions and reactive behaviors as well as not being as nervious when out in a public setting. The patients med therapy will continue to be managed by psychiatrist Shavon Rankin. The patient spoke about life change adjustments with being retired and feeling like he is much more adjusted to the life change of retirement. The OPT therapist utilized Cognitive Behavioral Therapy through cognitive restructuring as well as worked with the patient on coping strategies to assist in management of GAD. The patient continues to utlize his existing support network primarly limited to immediate family and his wife.The patient spoke about acknowledged of the importance of  consistency in maintaining protective factors to stay on the same improved trajectory. The patient noted his mental health and physical health have both been better in the last few weeks than they have been over the last few years.The OPT therapist over-viewed with the patient upcoming appointments as listed in his MyChart.   Suicidal/Homicidal: Nowithout intent/plan   Therapist Response: The OPT therapist worked with the patient for the patients scheduled session. The patient was engaged in his session and gave feedback in relation to triggers, symptoms, and behavior responses over the past few weeks.  The patient presented in a elated happier mood this session speaking to improvements through a combination of changes over the course of the past few weeks.The patient spoke about improved communication with his wife, benefit from med therapy, and being able to better control his emotions and reactive behaviors and to not be nervous when out in a public setting. The patients med therapy will continue to be managed by psychiatrist Shavon Rankin. The patient spoke about life change adjustments with he and his wife being retired and his involvement in caregiving for his grandson and looking forward to going camping in the upcoming weekend . The OPT therapist worked with the patient utilizing an in session Cognitive Behavioral Therapy exercise. The patient was responsive in the session and verbalized, Thanksgiving went really well and we will be host about 30 people for a upcoming Christmas dinner and I will be cooking for that.  The OPT therapist overviewed maintaining protective factors including consistency with med management, communication with his wife, utilization of his support network, spending leisure time with grandchild, and involvement in community based organization  with the [employer] as key pieces to stay on the improved trajectory moving forward. The patient spoke about although the prior  trauma is a factor he has been better able to manage his PTSD and staying active and found it helpful to regulate sleep cycle.The OPT therapist worked with the patient overviewing appointments listed in the patients MyChart. The OPT therapist will continue treatment work with the patient in his next scheduled session.     Plan: Return again in 2/3 weeks.   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 Patient/Guardian was advised if they have not already done so to contact the registration department to sign all necessary forms in order for us  to release information regarding their care.    Consent: Patient/Guardian gives verbal consent for treatment and assignment of benefits for services provided during this visit. Patient/Guardian expressed understanding and agreed to proceed.    I discussed the assessment and treatment plan with the patient. The patient was provided an opportunity to ask questions and all were answered. The patient agreed with the plan and demonstrated an understanding of the instructions.   The patient was advised to call back or seek an in-person evaluation if the symptoms worsen or if the condition fails to improve as anticipated.   I provided 30 minutes of face-to-face time during this encounter.   William ONEIDA Pepper, LCSW    03/01/2024

## 2024-03-10 ENCOUNTER — Ambulatory Visit: Payer: Self-pay | Admitting: Gastroenterology

## 2024-03-13 ENCOUNTER — Telehealth: Payer: Self-pay | Admitting: Oncology

## 2024-03-13 NOTE — Telephone Encounter (Signed)
 [facility] AUTH# [id number] VALIDITY 04/14/24-04/14/25

## 2024-03-13 NOTE — Progress Notes (Signed)
 Noted   Pt seen his results and his result message

## 2024-03-20 ENCOUNTER — Encounter: Payer: Self-pay | Admitting: *Deleted

## 2024-03-20 ENCOUNTER — Encounter (HOSPITAL_COMMUNITY): Payer: Self-pay | Admitting: Registered Nurse

## 2024-03-20 ENCOUNTER — Telehealth (INDEPENDENT_AMBULATORY_CARE_PROVIDER_SITE_OTHER): Admitting: Registered Nurse

## 2024-03-20 DIAGNOSIS — F411 Generalized anxiety disorder: Secondary | ICD-10-CM | POA: Diagnosis not present

## 2024-03-20 DIAGNOSIS — F5104 Psychophysiologic insomnia: Secondary | ICD-10-CM | POA: Diagnosis not present

## 2024-03-20 DIAGNOSIS — Z79899 Other long term (current) drug therapy: Secondary | ICD-10-CM | POA: Diagnosis not present

## 2024-03-20 DIAGNOSIS — F4312 Post-traumatic stress disorder, chronic: Secondary | ICD-10-CM

## 2024-03-20 MED ORDER — GABAPENTIN 100 MG PO CAPS: 100.0000 mg | ORAL_CAPSULE | Freq: Three times a day (TID) | ORAL | 1 refills | Status: DC

## 2024-03-20 MED ORDER — ZOLPIDEM TARTRATE ER 12.5 MG PO TBCR: 12.5000 mg | EXTENDED_RELEASE_TABLET | Freq: Every day | ORAL | 1 refills | Status: DC

## 2024-03-20 MED ORDER — BUSPIRONE HCL 5 MG PO TABS: 5.0000 mg | ORAL_TABLET | Freq: Three times a day (TID) | ORAL | 1 refills | Status: AC

## 2024-03-20 MED ORDER — SERTRALINE HCL 100 MG PO TABS: 100.0000 mg | ORAL_TABLET | Freq: Every day | ORAL | 1 refills | Status: AC
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
## 2024-03-20 NOTE — Patient Instructions (Signed)

## 2024-03-29 ENCOUNTER — Emergency Department (HOSPITAL_COMMUNITY)

## 2024-03-29 ENCOUNTER — Other Ambulatory Visit: Payer: Self-pay

## 2024-03-29 ENCOUNTER — Emergency Department (HOSPITAL_COMMUNITY)
Admission: EM | Admit: 2024-03-29 | Discharge: 2024-03-29 | Disposition: A | Discharge status: Home / Self Care | Attending: Emergency Medicine | Admitting: Emergency Medicine

## 2024-03-29 ENCOUNTER — Encounter (HOSPITAL_COMMUNITY): Payer: Self-pay

## 2024-03-29 DIAGNOSIS — S00511A Abrasion of lip, initial encounter: Secondary | ICD-10-CM | POA: Insufficient documentation

## 2024-03-29 DIAGNOSIS — R569 Unspecified convulsions: Secondary | ICD-10-CM | POA: Diagnosis not present

## 2024-03-29 DIAGNOSIS — I2510 Atherosclerotic heart disease of native coronary artery without angina pectoris: Secondary | ICD-10-CM | POA: Insufficient documentation

## 2024-03-29 DIAGNOSIS — X58XXXA Exposure to other specified factors, initial encounter: Secondary | ICD-10-CM | POA: Diagnosis not present

## 2024-03-29 DIAGNOSIS — R519 Headache, unspecified: Secondary | ICD-10-CM | POA: Diagnosis present

## 2024-03-29 LAB — CBC WITH DIFFERENTIAL/PLATELET
Abs Immature Granulocytes: 0.03 K/uL (ref 0.00–0.07)
Basophils Absolute: 0 K/uL (ref 0.0–0.1)
Basophils Relative: 1 %
Eosinophils Absolute: 0.2 K/uL (ref 0.0–0.5)
Eosinophils Relative: 3 %
HCT: 38.7 % — ABNORMAL LOW (ref 39.0–52.0)
Hemoglobin: 12.8 g/dL — ABNORMAL LOW (ref 13.0–17.0)
Immature Granulocytes: 1 %
Lymphocytes Relative: 11 %
Lymphs Abs: 0.6 K/uL — ABNORMAL LOW (ref 0.7–4.0)
MCH: 29.1 pg (ref 26.0–34.0)
MCHC: 33.1 g/dL (ref 30.0–36.0)
MCV: 88 fL (ref 80.0–100.0)
Monocytes Absolute: 0.6 K/uL (ref 0.1–1.0)
Monocytes Relative: 10 %
Neutro Abs: 4.6 K/uL (ref 1.7–7.7)
Neutrophils Relative %: 74 %
Platelets: 150 K/uL (ref 150–400)
RBC: 4.4 MIL/uL (ref 4.22–5.81)
RDW: 14.8 % (ref 11.5–15.5)
WBC: 6.1 K/uL (ref 4.0–10.5)
nRBC: 0 % (ref 0.0–0.2)

## 2024-03-29 LAB — URINALYSIS, ROUTINE W REFLEX MICROSCOPIC
Bacteria, UA: NONE SEEN
Bilirubin Urine: NEGATIVE
Glucose, UA: NEGATIVE mg/dL
Hgb urine dipstick: NEGATIVE
Ketones, ur: NEGATIVE mg/dL
Leukocytes,Ua: NEGATIVE
Nitrite: NEGATIVE
Protein, ur: 30 mg/dL — AB
Specific Gravity, Urine: 1.017 (ref 1.005–1.030)
pH: 5 (ref 5.0–8.0)

## 2024-03-29 LAB — COMPREHENSIVE METABOLIC PANEL WITH GFR
ALT: 10 U/L (ref 0–44)
AST: 18 U/L (ref 15–41)
Albumin: 4.2 g/dL (ref 3.5–5.0)
Alkaline Phosphatase: 95 U/L (ref 38–126)
Anion gap: 16 — ABNORMAL HIGH (ref 5–15)
BUN: 20 mg/dL (ref 8–23)
CO2: 18 mmol/L — ABNORMAL LOW (ref 22–32)
Calcium: 8.4 mg/dL — ABNORMAL LOW (ref 8.9–10.3)
Chloride: 106 mmol/L (ref 98–111)
Creatinine, Ser: 1.03 mg/dL (ref 0.61–1.24)
GFR, Estimated: >60 mL/min
Glucose, Bld: 87 mg/dL (ref 70–99)
Potassium: 4.1 mmol/L (ref 3.5–5.1)
Sodium: 140 mmol/L (ref 135–145)
Total Bilirubin: 0.4 mg/dL (ref 0.0–1.2)
Total Protein: 6.4 g/dL — ABNORMAL LOW (ref 6.5–8.1)

## 2024-03-29 LAB — URINE DRUG SCREEN
Amphetamines: NEGATIVE
Barbiturates: NEGATIVE
Benzodiazepines: NEGATIVE
Cocaine: NEGATIVE
Fentanyl: NEGATIVE
Methadone Scn, Ur: NEGATIVE
Opiates: NEGATIVE
Tetrahydrocannabinol: POSITIVE — AB

## 2024-03-29 LAB — CBG MONITORING, ED: Glucose-Capillary: 110 mg/dL — ABNORMAL HIGH (ref 70–99)

## 2024-03-29 LAB — MAGNESIUM: Magnesium: 2.3 mg/dL (ref 1.7–2.4)

## 2024-03-29 MED ORDER — LIDOCAINE 5 % EX PTCH
1.0000 | MEDICATED_PATCH | CUTANEOUS | Status: DC
  Administered 2024-03-29: 1 via TRANSDERMAL
  Filled 2024-03-29: qty 1

## 2024-03-29 MED ORDER — KETOROLAC TROMETHAMINE 15 MG/ML IJ SOLN
15.0000 mg | Freq: Once | INTRAMUSCULAR | Status: AC
  Administered 2024-03-29: 15 mg via INTRAVENOUS
  Filled 2024-03-29: qty 1

## 2024-03-29 MED ORDER — LEVETIRACETAM 500 MG PO TABS: 500.0000 mg | ORAL_TABLET | Freq: Two times a day (BID) | ORAL | 5 refills | Status: DC

## 2024-03-29 MED ORDER — METHOCARBAMOL 500 MG PO TABS
1000.0000 mg | ORAL_TABLET | Freq: Once | ORAL | Status: AC
  Administered 2024-03-29: 1000 mg via ORAL
  Filled 2024-03-29: qty 2

## 2024-03-29 MED ORDER — LACTATED RINGERS IV BOLUS
1000.0000 mL | Freq: Once | INTRAVENOUS | Status: AC
  Administered 2024-03-29: 1000 mL via INTRAVENOUS

## 2024-03-29 MED ORDER — LEVETIRACETAM (KEPPRA) 500 MG/5 ML ADULT IV PUSH
1500.0000 mg | Freq: Once | INTRAVENOUS | Status: AC
  Administered 2024-03-29: 1500 mg via INTRAVENOUS
  Filled 2024-03-29: qty 15

## 2024-03-29 NOTE — ED Notes (Signed)
"  Pt gone to CT  "

## 2024-03-29 NOTE — ED Triage Notes (Signed)
 Pt from [facility] was disoriented upon arrival. PT had 3 seizures while decorating deer pieces around the house.

## 2024-03-29 NOTE — Discharge Instructions (Addendum)
 A prescription for a medication called levetiracetam  was sent to your pharmacy.  Take this twice a day as prescribed to minimize chances of further seizures.  Follow-up with your neurologist.  Return to the emergency department for any further episodes of concern.  Per [place]  DMV statutes, patients with seizures are not allowed to drive until  they have been seizure-free for six months. Use caution when using heavy equipment or power tools. Avoid working on ladders or at heights. Take showers instead of baths. Ensure the water  temperature is not too high on the home water  heater. Do not go swimming alone. When caring for infants or small children, sit down when holding, feeding, or changing them to minimize risk of injury to the child in the event you have a seizure.   Also, Maintain good sleep hygiene. Avoid alcohol.

## 2024-03-29 NOTE — ED Provider Notes (Signed)
 " [place] [facility] AT [facility] Provider Note   CSN: [id number] Arrival date & time: 03/29/24  1731     Patient presents with: Seizures   William Farley is a 64 y.o. male.   HPI Patient presents for seizure.  Medical history includes anxiety, depression, GERD, atrial fibrillation, CAD, arthritis, seizures.  He was last seen by neurology 2-1/2 years ago after a single episode seizure.  Workup at the time showed normal MRI, normal 24-hour EEG.  He was started on Depakote but developed thrombocytopenia.  He was weaned off of Depakote.  Given unclear etiology of his seizure-like episode, no continued AED was indicated.  He is prescribed gabapentin  but no AEDs.  He denies alcohol use.  He does smoke marijuana.  Earlier today, he was in his normal state of health.  While putting weight Christmas decorations, he had a witnessed seizure-like episode.  Episode was witnessed by his wife.  She describes patient going unresponsive, eyes rolling back, eyes fluttering, limb stiffening.  He did have a fall at the time.  He had quick regain of consciousness but did have a postictal state during which he was confused.  Postictal state was witnessed by EMS.  He was initially alert and oriented x 1.  His mentation has improved but he still has some memory loss when trying to think of his medications.  He endorses headache and pain in his lip.  He denies any other areas of discomfort.    Prior to Admission medications  Medication Sig Start Date End Date Taking? Authorizing Provider  levETIRAcetam  (KEPPRA ) 500 MG tablet Take 1 tablet (500 mg total) by mouth 2 (two) times daily. 03/29/24 09/25/24 Yes Melvenia Motto, MD  allopurinol  (ZYLOPRIM ) 300 MG tablet Take 1 tablet (300 mg total) by mouth daily. 11/10/23   McKenzie, Belvie CROME, MD  busPIRone  (BUSPAR ) 5 MG tablet Take 1 tablet (5 mg total) by mouth 3 (three) times daily. 03/20/24   Rankin, Shuvon B, NP  gabapentin  (NEURONTIN ) 100 MG capsule Take 1  capsule (100 mg total) by mouth 3 (three) times daily as needed. 02/10/24   Rankin, Shuvon B, NP  gabapentin  (NEURONTIN ) 100 MG capsule Take 1 capsule (100 mg total) by mouth 3 (three) times daily. 03/20/24   Rankin, Shuvon B, NP  metoprolol  succinate (TOPROL -XL) 25 MG 24 hr tablet Take 0.5 tablets (12.5 mg total) by mouth daily. 06/10/23   Miriam Norris, NP  omeprazole (PRILOSEC) 20 MG capsule Take 20 mg by mouth in the morning. 04/11/20   [provider]  sertraline  (ZOLOFT ) 100 MG tablet Take 1 tablet (100 mg total) by mouth daily. 03/20/24   Rankin, Shuvon B, NP  tamsulosin  (FLOMAX ) 0.4 MG CAPS capsule Take 1 capsule (0.4 mg total) by mouth in the morning and at bedtime. 11/10/23   McKenzie, Belvie CROME, MD  Vitamin D , Ergocalciferol , (DRISDOL ) 1.25 MG (50000 UNIT) CAPS capsule Take 1 capsule (50,000 Units total) by mouth every 7 (seven) days. 01/24/24   Bevely Doffing, FNP  zolpidem  (AMBIEN  CR) 12.5 MG CR tablet Take 1 tablet (12.5 mg total) by mouth at bedtime. 03/20/24   Rankin, Shuvon B, NP    Allergies: Codeine    Review of Systems  Neurological:  Positive for seizures and headaches.  All other systems reviewed and are negative.   Updated Vital Signs BP 115/74   Pulse (!) 53   Temp 98.4 F (36.9 C) (Oral)   Resp 13   Ht 5' 11 (1.803  m)   Wt 94.8 kg   SpO2 96%   BMI 29.15 kg/m   Physical Exam Vitals and nursing note reviewed.  Constitutional:      General: He is not in acute distress.    Appearance: Normal appearance. He is well-developed. He is not ill-appearing, toxic-appearing or diaphoretic.  HENT:     Head: Normocephalic and atraumatic.     Left Ear: External ear normal.     Nose: Nose normal.     Mouth/Throat:     Mouth: Mucous membranes are moist.     Comments: Tongue bite present.  Abrasion to anterior lower lip. Eyes:     Extraocular Movements: Extraocular movements intact.     Conjunctiva/sclera: Conjunctivae normal.     Pupils: Pupils are equal,  round, and reactive to light.  Cardiovascular:     Rate and Rhythm: Normal rate and regular rhythm.     Heart sounds: No murmur heard. Pulmonary:     Effort: Pulmonary effort is normal. No respiratory distress.     Breath sounds: Normal breath sounds. No wheezing or rales.  Chest:     Chest wall: No tenderness.  Abdominal:     General: There is no distension.     Palpations: Abdomen is soft.     Tenderness: There is no abdominal tenderness.  Musculoskeletal:        General: No swelling, tenderness, deformity or signs of injury. Normal range of motion.     Cervical back: Normal range of motion and neck supple.  Skin:    General: Skin is warm and dry.     Capillary Refill: Capillary refill takes less than 2 seconds.     Coloration: Skin is not jaundiced or pale.  Neurological:     General: No focal deficit present.     Mental Status: He is alert and oriented to person, place, and time.     Cranial Nerves: No cranial nerve deficit.     Sensory: No sensory deficit.     Motor: No weakness.     Coordination: Coordination normal.  Psychiatric:        Mood and Affect: Mood normal.        Behavior: Behavior normal.     (all labs ordered are listed, but only abnormal results are displayed) Labs Reviewed  COMPREHENSIVE METABOLIC PANEL WITH GFR - Abnormal; Notable for the following components:      Result Value   CO2 18 (*)    Calcium 8.4 (*)    Total Protein 6.4 (*)    Anion gap 16 (*)    All other components within normal limits  CBC WITH DIFFERENTIAL/PLATELET - Abnormal; Notable for the following components:   Hemoglobin 12.8 (*)    HCT 38.7 (*)    Lymphs Abs 0.6 (*)    All other components within normal limits  URINALYSIS, ROUTINE W REFLEX MICROSCOPIC - Abnormal; Notable for the following components:   Protein, ur 30 (*)    All other components within normal limits  CBG MONITORING, ED - Abnormal; Notable for the following components:   Glucose-Capillary 110 (*)    All  other components within normal limits  MAGNESIUM  URINE DRUG SCREEN    EKG: EKG Interpretation Date/Time:  Wednesday March 29 2024 18:30:02 EST Ventricular Rate:  61 PR Interval:    QRS Duration:  92 QT Interval:  417 QTC Calculation: 420 R Axis:   77  Text Interpretation: Sinus rhythm Confirmed by Melvenia Motto [phone]) on 03/29/2024  6:42:24 PM  Radiology: CT CERVICAL SPINE WO CONTRAST Result Date: 03/29/2024 CLINICAL DATA:  Status post multiple seizures. EXAM: CT CERVICAL SPINE WITHOUT CONTRAST TECHNIQUE: Multidetector CT imaging of the cervical spine was performed without intravenous contrast. Multiplanar CT image reconstructions were also generated. RADIATION DOSE REDUCTION: This exam was performed according to the departmental dose-optimization program which includes automated exposure control, adjustment of the mA and/or kV according to patient size and/or use of iterative reconstruction technique. COMPARISON:  None Available. FINDINGS: Alignment: There is approximately 2 mm retrolisthesis of the C4 vertebral body on C5. Skull base and vertebrae: No acute fracture. No primary bone lesion or focal pathologic process. Soft tissues and spinal canal: No prevertebral fluid or swelling. No visible canal hematoma. Disc levels: Mild endplate sclerosis is seen at the level of C4-C5 and C7-T1 with a metallic density fixation plate and screws present along the anterior aspects of the C5, C6 and C7 vertebral bodies. There is anterior cervical fusion of the levels of C5-C6 and C6-C7 with moderate to marked severity intervertebral disc space narrowing present at C4-C5. Bilateral mild-to-moderate severity multilevel facet joint hypertrophy is noted. Upper chest: Negative. Other: None. IMPRESSION: 1. No acute fracture or traumatic subluxation of the cervical spine. 2. Anterior cervical fusion of the levels of C5-C6 and C6-C7. 3. Moderate to marked severity degenerative disc disease at C4-C5. Electronically Signed    By: Suzen Dials M.D.   On: 03/29/2024 18:40   CT HEAD WO CONTRAST Result Date: 03/29/2024 CLINICAL DATA:  Status post multiple seizures. EXAM: CT HEAD WITHOUT CONTRAST TECHNIQUE: Contiguous axial images were obtained from the base of the skull through the vertex without intravenous contrast. RADIATION DOSE REDUCTION: This exam was performed according to the departmental dose-optimization program which includes automated exposure control, adjustment of the mA and/or kV according to patient size and/or use of iterative reconstruction technique. COMPARISON:  None Available. FINDINGS: Brain: No evidence of acute infarction, hemorrhage, hydrocephalus, extra-axial collection or mass lesion/mass effect. Vascular: No hyperdense vessel or unexpected calcification. Skull: Normal. Negative for fracture or focal lesion. Sinuses/Orbits: No acute finding. Other: None. IMPRESSION: No acute intracranial pathology. Electronically Signed   By: Suzen Dials M.D.   On: 03/29/2024 18:31   DG Chest Port 1 View Result Date: 03/29/2024 EXAM: 1 VIEW(S) XRAY OF THE CHEST 03/29/2024 06:00:00 PM COMPARISON: None available. CLINICAL HISTORY: Seizure (HCC) FINDINGS: LUNGS AND PLEURA: No focal pulmonary opacity. No pleural effusion. No pneumothorax. HEART AND MEDIASTINUM: No acute abnormality of the cardiac and mediastinal silhouettes. BONES AND SOFT TISSUES: Partially visualized cervical spine surgical hardware. No acute osseous abnormality. IMPRESSION: 1. No acute findings. Electronically signed by: Elsie Gravely MD 03/29/2024 06:06 PM EST RP Workstation: [id number]     Procedures   Medications Ordered in the ED  lidocaine  (LIDODERM ) 5 % 1 patch (1 patch Transdermal Patch Applied 03/29/24 1903)  levETIRAcetam  (KEPPRA ) undiluted injection 1,500 mg (1,500 mg Intravenous Given 03/29/24 1806)  lactated ringers  bolus 1,000 mL (0 mLs Intravenous Stopped 03/29/24 1903)  methocarbamol  (ROBAXIN ) tablet 1,000 mg (1,000 mg Oral  Given 03/29/24 1903)  ketorolac  (TORADOL ) 15 MG/ML injection 15 mg (15 mg Intravenous Given 03/29/24 1903)                                    Medical Decision Making Amount and/or Complexity of Data Reviewed Labs: ordered. Radiology: ordered.  Risk Prescription drug management.   This patient presents  to the ED for concern of seizure, this involves an extensive number of treatment options, and is a complaint that carries with it a high risk of complications and morbidity.  The differential diagnosis includes infection, drug use, withdrawal, metabolic derangements, acute injuries   Co morbidities / Chronic conditions that complicate the patient evaluation  anxiety, depression, GERD, atrial fibrillation, CAD, arthritis, seizures   Additional history obtained:  Additional history obtained from EMR External records from outside source obtained and reviewed including EMS, patient's wife   Lab Tests:  I Ordered, and personally interpreted labs.  The pertinent results include: Hemoglobin, no leukocytosis, normal kidney function, normal electrolytes   Imaging Studies ordered:  I ordered imaging studies including chest x-ray, CT of head and cervical spine I independently visualized and interpreted imaging which showed no acute findings I agree with the radiologist interpretation   Cardiac Monitoring: / EKG:  The patient was maintained on a cardiac monitor.  I personally viewed and interpreted the cardiac monitored which showed an underlying rhythm of: Sinus rhythm   Problem List / ED Course / Critical interventions / Medication management  Patient presenting for seizure-like episode.  This was noticed by his wife.  He has a history of the same.  He believes that this is the third episode that he has had.  Per chart review, he was seen following an episode that occurred 3 years ago.  Workup at the time was negative.  He is not currently on any AEDs.  On arrival in the ED, patient  is well-appearing.  He is alert and oriented but does have some difficulty recalling his medications.  He does have presence of tongue bite.  He has no focal neurologic deficits.  Keppra  load was ordered.  Workup was initiated.  Imaging studies did not show any acute findings.  While in the ED, patient endorsed left lower back pain.  This is a chronic condition for him.  He feels like he worsened lately when he was lifting heavy rocks.  Multimodal pain control was ordered.  Patient had improved symptoms with sustained return to mental baseline while in the emergency department.  His lab work and imaging studies were unremarkable.  Patient to continue Keppra  twice daily and follow-up with his neurologist.  He is stable for discharge. I ordered medication including IV fluids for hydration, Keppra  for seizure prophylaxis, lidocaine  patch, Robaxin , and Toradol  for analgesia Reevaluation of the patient after these medicines showed that the patient improved I have reviewed the patients home medicines and have made adjustments as needed  Social Determinants of Health:  Lives at home with family     Final diagnoses:  Seizure [facility])    ED Discharge Orders          Ordered    levETIRAcetam  (KEPPRA ) 500 MG tablet  2 times daily        03/29/24 2037               Melvenia Motto, MD 03/29/24 2037  "

## 2024-03-31 ENCOUNTER — Telehealth: Payer: Self-pay | Admitting: Neurology

## 2024-03-31 NOTE — Telephone Encounter (Signed)
 Pt called in this morning and he collapse on 03-29-24 and had to be rushed to the Emergency room by Ambulance. Pt was told he had a seizure. Pt is schedule to come to our office on 04-07-24. Thanks

## 2024-03-31 NOTE — Telephone Encounter (Signed)
 Noted, keep appt next week, thanks

## 2024-04-05 ENCOUNTER — Ambulatory Visit (HOSPITAL_COMMUNITY): Admitting: Clinical

## 2024-04-05 DIAGNOSIS — F4310 Post-traumatic stress disorder, unspecified: Secondary | ICD-10-CM | POA: Diagnosis not present

## 2024-04-05 DIAGNOSIS — F331 Major depressive disorder, recurrent, moderate: Secondary | ICD-10-CM | POA: Diagnosis not present

## 2024-04-05 DIAGNOSIS — F419 Anxiety disorder, unspecified: Secondary | ICD-10-CM

## 2024-04-05 NOTE — Progress Notes (Signed)
 IN PERSON    I connected with William Farley on 04/05/24 at  11:00 AM EDT in person and verified that I am speaking with the correct person using two identifiers.   Location: Patient: office  Provider: office    I discussed the limitations of evaluation and management by telemedicine and the availability of in person appointments. The patient expressed understanding and agreed to proceed. ( IN PERSON )   THERAPIST PROGRESS NOTE   Session Time: 11:00 AM- 11:30 AM   Participation Level: Active   Behavioral Response: CasualAlertAnxious   Type of Therapy: Individual Therapy   Treatment Goals addressed: Coping Anger Management   Interventions: CBT, Motivational Interviewing, Solution Focused and Supportive   Summary: William Farley is a 64 y.o. male who presents with PTSD  and MDD with Anxiety. The OPT therapist worked with the patient for his scheduled session. The OPT therapist utilized Motivational Interviewing to assist in creating therapeutic repore.  The patient presented in a elated happier mood this session .The patient spoke about ongoing improved communication with his wife, benefit from med therapy, and being able to better control his emotions and reactive behaviors as well as not being as nervious when out in a public setting. The patients med therapy will continue to be managed by psychiatrist Shavon Rankin. The patient spoke about physical health event that led to hospital ED visit post passing out at this home a week ago in which the patient had a blackout spell. The patient will continue to follow up with specialist for ongoing testing with Cardiology and Neurology to learn more about potienal underlying factors that led to the event. The OPT therapist utilized Cognitive Behavioral Therapy through cognitive restructuring as well as worked with the patient on coping strategies to assist in management of GAD. The patient continues to utlize his existing support network primarly  limited to immediate family and his wife.The patient spoke about acknowledged of the importance of consistency in maintaining protective factors to stay on the same improved trajectory while acknowledging not being at a baseline due to the recent health event that led him to ED, however, since the patient has not had an additional episode and has limited prior history noting only 1 other similar event years ago..The OPT therapist over-viewed with the patient upcoming appointments as listed in his MyChart and await physical health results to learn more about potential physical health problem.   Suicidal/Homicidal: Nowithout intent/plan   Therapist Response: The OPT therapist worked with the patient for the patients scheduled session. The patient was engaged in his session and gave feedback in relation to triggers, symptoms, and behavior responses over the past few weeks.  The patient presented in a elated mood despite being rushed to hospital a week ago post a blackout event in which he lost consciousness. The patient spoke about the event and noted not feeling off or noticing anything different stating ,  It was just like someone turned off a light switch and I just went out. The patient spoke about improved communication with his wife, benefit from med therapy, and being able to better control his emotions and reactive behaviors and to not be nervous when out in a public setting. The patients med therapy will continue to be managed by psychiatrist Shavon Rankin. The patient spoke about the potential of this event maybe being related to a drop in blood sugar. The patient will be following up with health specialist including Neurology and Cardiology for further post event  tests. The OPT therapist worked with the patient overviewing appointments listed in the patients MyChart. The OPT therapist will continue treatment work with the patient in his next scheduled session.     Plan: Return again in 2/3 weeks.    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 Patient/Guardian was advised if they have not already done so to contact the registration department to sign all necessary forms in order for us  to release information regarding their care.    Consent: Patient/Guardian gives verbal consent for treatment and assignment of benefits for services provided during this visit. Patient/Guardian expressed understanding and agreed to proceed.    I discussed the assessment and treatment plan with the patient. The patient was provided an opportunity to ask questions and all were answered. The patient agreed with the plan and demonstrated an understanding of the instructions.   The patient was advised to call back or seek an in-person evaluation if the symptoms worsen or if the condition fails to improve as anticipated.   I provided 30 minutes of face-to-face time during this encounter.   William ONEIDA Pepper, LCSW    04/05/2024

## 2024-04-06 NOTE — Progress Notes (Unsigned)
 "  NEUROLOGY FOLLOW UP OFFICE NOTE  William Farley [id number] 23-Feb-1961  HISTORY OF PRESENT ILLNESS: I had the pleasure of seeing William Farley in follow-up in the neurology clinic on 04/07/2024.  The patient was last seen in August 2023 after an episode of loss of consciousness with shaking that occurred in 02/2021. Brain MRI, 24-hour EEG normal. He has been off Depakote since 2023 and is accompanied by *** today.  Records and images were personally reviewed where available.  ***.  Left pressure HAs,  Wife: looked like he was not feeling well, a little wobbly, said he was okay, then heard him making noise, then went down, convulsing, bit tongue gurgling Not sure what glucose was with EMS Confused after, had diff following instructions +2, 5/5, 2/3 Gbp took only for 2-3 days, has not taken for weeks; off lzp for a while now No alcohol Gets aggravated Recalls being on LTG   1/7: While putting weight Christmas decorations, he had a witnessed seizure-like episode. Episode was witnessed by his wife. She describes patient going unresponsive, eyes rolling back, eyes fluttering, limb stiffening. He did have a fall at the time. He had quick regain of consciousness but did have a postictal state during which he was confused. Postictal state was witnessed by EMS.  Head CT nl; UDS THC, CBC, CMP nl 12/2023: Predominant rhythm is sinus with heart rate ranging from 42 bpm up to 137 bpm with average heart rate 65 bpm. There were rare PACs including atrial couplets and triplets representing less than 1% total beats. There were rare PVCs including ventricular couplets and triplets representing less than 1% total beats. Single 4 beat episode of NSVT noted.  No sustained ventricular arrhythmias. Several (22) brief runs of PSVT were noted, the longest of which was 19 beats.  These were probable atrial tachycardia.  No sustained atrial arrhythmias.   I had the pleasure of seeing William Farley in  follow-up in the neurology clinic on 10/29/2021.  The patient was last seen 6 months ago after an episode of loss of consciousness with shaking that occurred 02/28/21. Brain MRI with and without contrast and 24-hour EEG normal. He was started on Depakote by his PCP but developed thrombocytopenia. On his last visit, he was weaned off Depakote, last platelet count in May normal. He denies any further episodes of loss of consciousness/shaking since 02/2021. He has always had times he would stare, but denies any loss of time or unresponsiveness. He has PTSD and depression, recently switched from Paxil and Seroquel to Cymbalta . He denies any olfactory/gustatory hallucinations, focal numbness/tingling/weakness, myoclonic jerks. No headaches, dizziness, vision changes, no falls. Glucose levels have been good. Sleep is rotten, he only gets a couple of hours despite taking Ambien . When his head hits the pillow, he starts thinking of so many things. He sees a therapist.    History On Initial Assessment 04/30/2021: This is a 64 year old right-handed man with a history of OSA, SVT, depression, PTSD, presenting for evaluation of need for Depakote started after an episode of loss of consciousness with shaking on 02/28/2021. He was in his usual state of health until 02/28/21 when his wife heard a thud. She heard him make a noise and found him between the tub and toilet. He was staring with legs and arms jerking, he kept biting his lip and tongue. This lasted for several minutes, then he was confused after, unable to answer questions. His last recollection is getting ready for bed, he does not  recall being in the bathroom, then came to in the ambulance. He had bitten his tongue/lip and had bowel incontinence. His wife reports BP was fine and glucose was a little low. He was brought to the [facility] ER where bloodwork was overall normal, glucose 167. TSH 7.291. UDS and EtOH negative. EKG and head CT without contrast were normal.  Symptoms felt due to vasovagal syncope. On his follow-up with his PCP a few days later, he was started on Depakote for possible seizure. He was seen by a neurologist in [place] and reportedly had a normal EEG, report unavailable for review. He denies any prior history of seizures. He and his wife deny any other staring/unresponsive episodes, gaps in time, olfactory/gustatory hallucinations, deja vu, rising epigastric sensation, focal numbness/tingling/weakness, myoclonic jerks. He gets dizzy when standing up quickly. No headaches, diplopia, dysarthria/dysphagia, neck/back pain, bowel/bladder dysfunction. He gets occasional stomach pains due to GERD. No recent medication changes. He gets 8-8.5 hours of sleep with his CPAP, no sleep deprivation. He has prn lorazepam  usually taken once a day depending on stress level, this was started after the seizure. Prior to the he was on Buspar . Mood is pretty good, he takes Seroquel. 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 He had a normal birth and early development.  There is no history of febrile convulsions, CNS infections such as meningitis/encephalitis, significant traumatic brain injury, neurosurgical procedures, or family history of seizures.   PAST MEDICAL HISTORY: Past Medical History:  Diagnosis Date   Anxiety    Arthritis    CAD (coronary artery disease)    Minimal Non-obstructive CAD seen on coronary CTA 04/2021   Depression    GERD (gastroesophageal reflux disease)    History of kidney stones    History of thrombocytopenia    Hx of syncope    Hypoglycemia    PAF (paroxysmal atrial fibrillation) (HCC)    ChadsVasc Score is 0.  Monitor revealed less than 1% PAF burden.   Seizure (HCC) 02/2021   Sleep apnea    wears CPAP     MEDICATIONS: Medications Ordered Prior to Encounter[1]  ALLERGIES: Allergies[2]  FAMILY HISTORY: Family History  Problem Relation Age of Onset   Heart disease Father 57   Kidney failure Father     Colon cancer Neg Hx     SOCIAL HISTORY: Social History   Socioeconomic History   Marital status: Married    Spouse name: Not on file   Number of children: Not on file   Years of education: Not on file   Highest education level: Associate degree: occupational, scientist, product/process development, or vocational program  Occupational History   Occupation: economist  Tobacco Use   Smoking status: Former    Current packs/day: 0.00    Average packs/day: 0.5 packs/day for 10.0 years (5.0 ttl pk-yrs)    Types: Cigarettes    Start date: 05/20/1986    Quit date: 05/20/1996    Years since quitting: 27.8    Passive exposure: Past   Smokeless tobacco: Never  Vaping Use   Vaping status: Never Used  Substance and Sexual Activity   Alcohol use: Not Currently   Drug use: Yes    Types: Marijuana    Comment: daily   Sexual activity: Yes  Other Topics Concern   Not on file  Social History Narrative   Right handed    Married wife   One story home   Social Drivers of Health   Tobacco Use: Medium  Risk (03/29/2024)   Patient History    Smoking Tobacco Use: Former    Smokeless Tobacco Use: Never    Passive Exposure: Past  Physicist, Medical Strain: Low Risk (01/20/2024)   Overall Financial Resource Strain (CARDIA)    Difficulty of Paying Living Expenses: Not hard at all  Food Insecurity: No Food Insecurity (01/20/2024)   Epic    Worried About Programme Researcher, Broadcasting/film/video in the Last Year: Never true    Ran Out of Food in the Last Year: Never true  Transportation Needs: No Transportation Needs (01/20/2024)   Epic    Lack of Transportation (Medical): No    Lack of Transportation (Non-Medical): No  Physical Activity: Insufficiently Active (01/20/2024)   Exercise Vital Sign    Days of Exercise per Week: 2 days    Minutes of Exercise per Session: 30 min  Stress: No Stress Concern Present (01/20/2024)   [employer] of Occupational Health - Occupational Stress Questionnaire    Feeling of Stress: Only  a little  Social Connections: Socially Integrated (01/20/2024)   Social Connection and Isolation Panel    Frequency of Communication with Friends and Family: More than three times a week    Frequency of Social Gatherings with Friends and Family: Twice a week    Attends Religious Services: More than 4 times per year    Active Member of Clubs or Organizations: Yes    Attends Banker Meetings: More than 4 times per year    Marital Status: Married  Catering Manager Violence: Not At Risk (03/10/2022)   Humiliation, Afraid, Rape, and Kick questionnaire    Fear of Current or Ex-Partner: No    Emotionally Abused: No    Physically Abused: No    Sexually Abused: No  Depression (PHQ2-9): Low Risk (02/10/2024)   Depression (PHQ2-9)    PHQ-2 Score: 1  Recent Concern: Depression (PHQ2-9) - Medium Risk (01/24/2024)   Depression (PHQ2-9)    PHQ-2 Score: 9  Alcohol Screen: Low Risk (02/10/2024)   Alcohol Screen    Last Alcohol Screening Score (AUDIT): 0  Housing: Low Risk (01/20/2024)   Epic    Unable to Pay for Housing in the Last Year: No    Number of Times Moved in the Last Year: 0    Homeless in the Last Year: No  Utilities: Not At Risk (03/10/2022)   [facility] Utilities    Threatened with loss of utilities: No  Health Literacy: Low Risk (05/28/2021)   Received from [facility]    How often do you need to have someone help you when you read instructions, pamphlets, or other written material from your doctor or pharmacy?: Never     PHYSICAL EXAM: There were no vitals filed for this visit. General: No acute distress Head:  Normocephalic/atraumatic Skin/Extremities: No rash, no edema Neurological Exam: alert and oriented to person, place, and time. No aphasia or dysarthria. Fund of knowledge is appropriate.  Recent and remote memory are intact.  Attention and concentration are normal.   Cranial nerves: Pupils equal, round. Extraocular movements intact with no  nystagmus. Visual fields full.  No facial asymmetry.  Motor: Bulk and tone normal, muscle strength 5/5 throughout with no pronator drift.   Finger to nose testing intact.  Gait narrow-based and steady, able to tandem walk adequately.  Romberg negative.   IMPRESSION: This is a 64 yo RH man with a history of OSA, SVT, depression, PTSD, who was started on Depakote after  an episode of loss of consciousness with shaking on 02/28/2021. Etiology unclear, convulsive syncope versus seizure. His neurological exam is normal, brain MRI and 24-hour EEG normal. He has been off Depakote since March 2023 with normalization of platelet count and no further convulsions since 02/2021. We again discussed that after an initial seizure, unless there are significant risk factors, an abnormal neurological exam, an EEG showing epileptiform abnormalities, and/or abnormal neuroimaging, treatment with an antiepileptic drug is not indicated. We discussed 10% of the population may have a single seizure. Patients with a single unprovoked seizure have a recurrence rate of 33% after a single seizure and 73% after a second seizure. He knows to call our office for any change in symptoms. We discussed avoidance of seizure triggers, he will discuss sleep dysfunction with his PCP. Follow-up as needed, call for any changes.    Thank you for allowing me to participate in *** care.  Please do not hesitate to call for any questions or concerns.  The duration of this appointment visit was *** minutes of face-to-face time with the patient.  Greater than 50% of this time was spent in counseling, explanation of diagnosis, planning of further management, and coordination of care.   Darice Shivers, M.D.   CC: ***      [1]  Current Outpatient Medications on File Prior to Visit  Medication Sig Dispense Refill   allopurinol  (ZYLOPRIM ) 300 MG tablet Take 1 tablet (300 mg total) by mouth daily. 90 tablet 3   busPIRone  (BUSPAR ) 5 MG tablet Take 1  tablet (5 mg total) by mouth 3 (three) times daily. 270 tablet 1   gabapentin  (NEURONTIN ) 100 MG capsule Take 1 capsule (100 mg total) by mouth 3 (three) times daily as needed. 42 capsule 0   gabapentin  (NEURONTIN ) 100 MG capsule Take 1 capsule (100 mg total) by mouth 3 (three) times daily. 270 capsule 1   levETIRAcetam  (KEPPRA ) 500 MG tablet Take 1 tablet (500 mg total) by mouth 2 (two) times daily. 60 tablet 5   metoprolol  succinate (TOPROL -XL) 25 MG 24 hr tablet Take 0.5 tablets (12.5 mg total) by mouth daily.     omeprazole (PRILOSEC) 20 MG capsule Take 20 mg by mouth in the morning.     sertraline  (ZOLOFT ) 100 MG tablet Take 1 tablet (100 mg total) by mouth daily. 90 tablet 1   tamsulosin  (FLOMAX ) 0.4 MG CAPS capsule Take 1 capsule (0.4 mg total) by mouth in the morning and at bedtime. 180 capsule 3   Vitamin D , Ergocalciferol , (DRISDOL ) 1.25 MG (50000 UNIT) CAPS capsule Take 1 capsule (50,000 Units total) by mouth every 7 (seven) days. 5 capsule 11   zolpidem  (AMBIEN  CR) 12.5 MG CR tablet Take 1 tablet (12.5 mg total) by mouth at bedtime. 90 tablet 1   No current facility-administered medications on file prior to visit.  [2]  Allergies Allergen Reactions   Codeine Itching, Rash and Other (See Comments)   "

## 2024-04-07 ENCOUNTER — Ambulatory Visit (INDEPENDENT_AMBULATORY_CARE_PROVIDER_SITE_OTHER): Admitting: Neurology

## 2024-04-07 ENCOUNTER — Encounter: Payer: Self-pay | Admitting: Neurology

## 2024-04-07 ENCOUNTER — Other Ambulatory Visit: Payer: Self-pay | Admitting: *Deleted

## 2024-04-07 VITALS — BP 116/73 | HR 54 | Ht 71.0 in | Wt 210.2 lb

## 2024-04-07 DIAGNOSIS — G40909 Epilepsy, unspecified, not intractable, without status epilepticus: Secondary | ICD-10-CM

## 2024-04-07 MED ORDER — LAMOTRIGINE 25 MG PO TABS: 25.0000 mg | ORAL_TABLET | Freq: Two times a day (BID) | ORAL | 6 refills | Status: AC

## 2024-04-07 MED ORDER — METOPROLOL SUCCINATE ER 25 MG PO TB24: 12.5000 mg | ORAL_TABLET | Freq: Every day | ORAL | 1 refills | Status: AC

## 2024-04-07 NOTE — Patient Instructions (Signed)
 Good to see you again.  Schedule MRI brain with and without contrast  2. Schedule 1-hour EEG  3. Start Lamotrigine  25mg : take 1 tablet twice a day. Update me in 2 weeks on MyChart  4. Follow-up in 6 weeks, call for any changes   Seizure Precautions: 1. If medication has been prescribed for you to prevent seizures, take it exactly as directed.  Do not stop taking the medicine without talking to your doctor first, even if you have not had a seizure in a long time.   2. Avoid activities in which a seizure would cause danger to yourself or to others.  Don't operate dangerous machinery, swim alone, or climb in high or dangerous places, such as on ladders, roofs, or girders.  Do not drive unless your doctor says you may.  3. If you have any warning that you may have a seizure, lay down in a safe place where you can't hurt yourself.    4.  No driving for 6 months from last seizure, as per [place]  state law.   Please refer to the following link on the Epilepsy Foundation of America's website for more information: http://www.epilepsyfoundation.org/answerplace/Social/driving/drivingu.cfm   5.  Maintain good sleep hygiene.   6.  Contact your doctor if you have any problems that may be related to the medicine you are taking.  7.  Call 911 and bring the patient back to the ED if:        A.  The seizure lasts longer than 5 minutes.       B.  The patient doesn't awaken shortly after the seizure  C.  The patient has new problems such as difficulty seeing, speaking or moving  D.  The patient was injured during the seizure  E.  The patient has a temperature over 102 F (39C)  F.  The patient vomited and now is having trouble breathing

## 2024-04-08 ENCOUNTER — Encounter: Payer: Self-pay | Admitting: Neurology

## 2024-04-10 ENCOUNTER — Ambulatory Visit: Admitting: Neurology

## 2024-04-10 DIAGNOSIS — G40909 Epilepsy, unspecified, not intractable, without status epilepticus: Secondary | ICD-10-CM

## 2024-04-10 NOTE — Progress Notes (Unsigned)
 EEG complete and ready for review.

## 2024-04-11 ENCOUNTER — Ambulatory Visit

## 2024-04-12 NOTE — Procedures (Signed)
 ELECTROENCEPHALOGRAM REPORT  Date of Study: 04/10/2024  Patient's Name: William Farley MRN: [id number] Date of Birth: [birth year]  Referring Provider: Dr. Darice Shivers  Clinical History: This is a 64 year old man with seizure recurrence. EEG for classification.  CNS Active Medications: Lamotrigine , Gabapentin , Sertraline   Technical Summary: A multichannel digital 1-hour EEG recording measured by the international 10-20 system with electrodes applied with paste and impedances below 5000 ohms performed in our laboratory with EKG monitoring in an awake and asleep patient.  Hyperventilation was not performed. Photic stimulation was performed.  The digital EEG was referentially recorded, reformatted, and digitally filtered in a variety of bipolar and referential montages for optimal display.    Description: The patient is awake and asleep during the recording.  During maximal wakefulness, there is a symmetric, medium voltage 10-11 Hz posterior dominant rhythm that attenuates with eye opening.  The record is symmetric.  During drowsiness and sleep, there is an increase in theta slowing of the background.  Vertex waves and symmetric sleep spindles were seen. Photic stimulation did not elicit any abnormalities.  There were no epileptiform discharges or electrographic seizures seen.    EKG lead was unremarkable.  Impression: This 1-hour awake and asleep EEG is normal.    Clinical Correlation: A normal EEG does not exclude a clinical diagnosis of epilepsy.  If further clinical questions remain, prolonged EEG may be helpful.  Clinical correlation is advised.   Darice Shivers, M.D.

## 2024-04-13 ENCOUNTER — Ambulatory Visit: Payer: Self-pay | Admitting: Neurology

## 2024-04-13 NOTE — Progress Notes (Unsigned)
 " [facility]   Telephone:([phone]) [phone] Fax:(336) [phone]   Clinic New Consult Note   Patient Care Team: Gasper Collar, FNP as PCP - General Debera Jayson MATSU, MD as PCP - Cardiology (Cardiology) Georjean Darice HERO, MD as Consulting Physician (Neurology) Bacchus, Meade PEDLAR, FNP as Registered Nurse (Family Medicine) 04/14/2024  CHIEF COMPLAINTS/PURPOSE OF CONSULTATION:  Splenomegaly   REFERRING PHYSICIAN: Shirlean Therisa ORN, NP   Discussed the use of AI scribe software for clinical note transcription with the patient, who gave verbal consent to proceed.  History of Present Illness William Farley is a 64 year old male with chronic splenomegaly and intermittent thrombocytopenia who presents for evaluation of splenomegaly.  He has splenomegaly first diagnosed over 30 years ago per pt (no imaging reports available). His imaging in 2025 (MRI, ultrasound, CT) showed mild to moderate enlargement of the spleen after prior normal studies in 2021-2022. He has remote hepatic steatosis without current hepatic abnormalities. He has chronic stable right-sided abdominal pain without acute worsening.  He has intermittent thrombocytopenia with prior platelet nadir at 92,000 requiring surgical clearance and discussion of transfusion. Recent counts in January 2026 were normal except for transient mild anemia and hypoproteinemia that resolved. He has not needed transfusions and denies abnormal bleeding or bruising.  Two weeks ago he had a syncopal event with loss of consciousness, head trauma, and brief confusion. His wife noted eye rolling and stiffening concerning for seizure. He was evaluated in the emergency department and discharged the same day. Neurology evaluation included a normal EEG and brain MRI is pending. He had a similar episode three years ago with normal cardiac and neurologic workup. He has non-diabetic hypoglycemia with episodes of sweating and malaise that self-resolve. He  cannot obtain a CGM due to insurance and uses an over-the-counter glucose monitor that he feels does not reliably detect hypoglycemia.  For two years he has had chronic night sweats and cold intolerance with nightly shaking and waking clammy and wet. He reports about 50 pounds of unintentional weight loss over two years, including a rapid drop from 250 to 203 pounds in eight days, now at 210 pounds. He was diagnosed with malnutrition and hypoglycemia and continues to have poor appetite and low hunger, which he partially attributes to depression and PTSD treated with medication.  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 He uses marijuana and does not smoke cigarettes. He had gastric bypass in 2007 for obesity with prior weight up to 310 pounds. He has mild coronary artery disease and uses metoprolol  for prior tachycardia and hypotension. He has had multiple surgeries for benign tumors, cysts, and orthopedic issues, with no known malignancy.     MEDICAL HISTORY:  Past Medical History:  Diagnosis Date   Anxiety    Arthritis    CAD (coronary artery disease)    Minimal Non-obstructive CAD seen on coronary CTA 04/2021   Depression    GERD (gastroesophageal reflux disease)    History of kidney stones    History of thrombocytopenia    Hx of syncope    Hypoglycemia    PAF (paroxysmal atrial fibrillation) (HCC)    ChadsVasc Score is 0.  Monitor revealed less than 1% PAF burden.   Seizure (HCC) 02/2021   Sleep apnea    wears CPAP     SURGICAL HISTORY: Past Surgical History:  Procedure Laterality Date   APPENDECTOMY     BIOPSY  05/01/2021   Procedure: BIOPSY;  Surgeon: [name], MD;  Location: [facility];  Service: Endoscopy;;   CERVICAL SPINE SURGERY  2018   CHOLECYSTECTOMY     COLONOSCOPY N/A 07/23/2023   Procedure: COLONOSCOPY;  Surgeon: [name], DO;  Location: [facility];  Service: Endoscopy;  Laterality: N/A;  8:30am;asa 2  ileocolonoscopy   COLONOSCOPY WITH PROPOFOL  N/A 05/01/2021   Procedure: COLONOSCOPY WITH PROPOFOL ;  Surgeon: [name], MD;  Location: [facility];  Service: Endoscopy;  Laterality: N/A;  1050   CYST EXCISION     CYSTOSCOPY     CYSTOSCOPY WITH RETROGRADE PYELOGRAM, URETEROSCOPY AND STENT PLACEMENT Right 08/08/2020   Procedure: CYSTOSCOPY WITH RIGHT RETROGRADE PYELOGRAM AND RIGHT URETEROSCOPY;  Surgeon: [name], MD;  Location: [facility];  Service: Urology;  Laterality: Right;   ESOPHAGOGASTRODUODENOSCOPY N/A 07/23/2023   Procedure: EGD (ESOPHAGOGASTRODUODENOSCOPY);  Surgeon: [name], DO;  [facility];  Service: Endoscopy;  Laterality: N/A;  8:30am;asa 2   EXTRACORPOREAL SHOCK WAVE LITHOTRIPSY Right 05/21/2020   Procedure: EXTRACORPOREAL SHOCK WAVE LITHOTRIPSY (ESWL);  Surgeon: [name], MD;  Location: [facility];  Service: Urology;  Laterality: Right;   GASTRIC BYPASS OPEN  2003   INCISIONAL HERNIA REPAIR     kidney stone removal     KNEE ARTHROSCOPY Left    multiple   NECK SURGERY     POLYPECTOMY  05/01/2021   Procedure: POLYPECTOMY;  Surgeon: [name], MD;  [facility]: [facility];  Service: Endoscopy;;   POSTERIOR FUSION PEDICLE SCREW PLACEMENT N/A 05/06/2023   Procedure: POSTERIOR LUMBAR INTERBODY FUSION LUMBAR FOUR-FIVE POSTERIOR LATERAL AND INTERBODY FUSION;  Surgeon: [name], MD;  Location: [facility];  Service: Neurosurgery;  Laterality: N/A;   REPLACEMENT TOTAL KNEE Right    RHINOPLASTY     STONE EXTRACTION WITH BASKET Right 08/08/2020   Procedure: STONE EXTRACTION WITH BASKET;  Surgeon: [name], MD;  Location: [facility];  Service: Urology;  Laterality: Right;   TOTAL KNEE ARTHROPLASTY Left 03/10/2022   Procedure: TOTAL KNEE ARTHROPLASTY;  Surgeon: [name], MD;  Location: [facility];  Service: Orthopedics;  Laterality: Left;    SOCIAL HISTORY: Social History   Socioeconomic History   Marital status: Married     Spouse name: Not on file   Number of children: Not on file   Years of education: Not on file   Highest education level: Associate degree: occupational, scientist, product/process development, or vocational program  Occupational History   Occupation: economist  Tobacco Use   Smoking status: Former    Current packs/day: 0.00    Average packs/day: 0.5 packs/day for 10.0 years (5.0 ttl pk-yrs)    Types: Cigarettes    Start date: 05/20/1986    Quit date: 05/20/1996    Years since quitting: 27.9    Passive exposure: Past   Smokeless tobacco: Never  Vaping Use   Vaping status: Never Used  Substance and Sexual Activity   Alcohol use: Not Currently   Drug use: Yes    Types: Marijuana    Comment: daily   Sexual activity: Yes  Other Topics Concern   Not on file  Social History Narrative   Right handed    Married wife   One story home   Social Drivers of Health   Tobacco Use: Medium Risk (04/14/2024)   Patient History    Smoking Tobacco Use: Former    Smokeless Tobacco Use: Never    Passive Exposure: Past  Physicist, Medical  Strain: Low Risk (01/20/2024)   Overall Financial Resource Strain (CARDIA)    Difficulty of Paying Living Expenses: Not hard at all  Food Insecurity: No Food Insecurity (04/14/2024)   Epic    Worried About Programme Researcher, Broadcasting/film/video in the Last Year: Never true    Ran Out of Food in the Last Year: Never true  Transportation Needs: No Transportation Needs (04/14/2024)   Epic    Lack of Transportation (Medical): No    Lack of Transportation (Non-Medical): No  Physical Activity: Insufficiently Active (01/20/2024)   Exercise Vital Sign    Days of Exercise per Week: 2 days    Minutes of Exercise per Session: 30 min  Stress: No Stress Concern Present (01/20/2024)   [employer] of Occupational Health - Occupational Stress Questionnaire    Feeling of Stress: Only a little  Social Connections: Socially Integrated (01/20/2024)   Social Connection and Isolation Panel    Frequency  of Communication with Friends and Family: More than three times a week    Frequency of Social Gatherings with Friends and Family: Twice a week    Attends Religious Services: More than 4 times per year    Active Member of Clubs or Organizations: Yes    Attends Banker Meetings: More than 4 times per year    Marital Status: Married  Catering Manager Violence: Unknown (04/14/2024)   Epic    Fear of Current or Ex-Partner: No    Emotionally Abused: No    Physically Abused: Not on file    Sexually Abused: No  Depression (PHQ2-9): Medium Risk (04/14/2024)   Depression (PHQ2-9)    PHQ-2 Score: 5  Alcohol Screen: Low Risk (02/10/2024)   Alcohol Screen    Last Alcohol Screening Score (AUDIT): 0  Housing: Low Risk (04/14/2024)   Epic    Unable to Pay for Housing in the Last Year: No    Number of Times Moved in the Last Year: 0    Homeless in the Last Year: No  Utilities: Not At Risk (04/14/2024)   Epic    Threatened with loss of utilities: No  Health Literacy: Low Risk (05/28/2021)   Received from [facility]    How often do you need to have someone help you when you read instructions, pamphlets, or other written material from your doctor or pharmacy?: Never    FAMILY HISTORY: Family History  Problem Relation Age of Onset   Heart disease Father 51   Kidney failure Father    Colon cancer Neg Hx     ALLERGIES:  is allergic to codeine.  MEDICATIONS:  Current Outpatient Medications  Medication Sig Dispense Refill   allopurinol  (ZYLOPRIM ) 300 MG tablet Take 1 tablet (300 mg total) by mouth daily. 90 tablet 3   busPIRone  (BUSPAR ) 5 MG tablet Take 1 tablet (5 mg total) by mouth 3 (three) times daily. 270 tablet 1   omeprazole (PRILOSEC) 20 MG capsule Take 20 mg by mouth in the morning.     sertraline  (ZOLOFT ) 100 MG tablet Take 1 tablet (100 mg total) by mouth daily. 90 tablet 1   tamsulosin  (FLOMAX ) 0.4 MG CAPS capsule Take 1 capsule (0.4 mg total) by  mouth in the morning and at bedtime. 180 capsule 3   Vitamin D , Ergocalciferol , (DRISDOL ) 1.25 MG (50000 UNIT) CAPS capsule Take 1 capsule (50,000 Units total) by mouth every 7 (seven) days. 5 capsule 11   lamoTRIgine  (LAMICTAL ) 25 MG tablet Take 1  tablet (25 mg total) by mouth 2 (two) times daily. 60 tablet 6   metoprolol  succinate (TOPROL -XL) 25 MG 24 hr tablet Take 0.5 tablets (12.5 mg total) by mouth daily. 45 tablet 1   No current facility-administered medications for this visit.    REVIEW OF SYSTEMS:   Constitutional: Denies fevers, chills or abnormal night sweats Eyes: Denies blurriness of vision, double vision or watery eyes Ears, nose, mouth, throat, and face: Denies mucositis or sore throat Respiratory: Denies cough, dyspnea or wheezes Cardiovascular: Denies palpitation, chest discomfort or lower extremity swelling Gastrointestinal:  Denies nausea, heartburn or change in bowel habits Skin: Denies abnormal skin rashes Lymphatics: Denies new lymphadenopathy or easy bruising Neurological:Denies numbness, tingling or new weaknesses Behavioral/Psych: Mood is stable, no new changes  All other systems were reviewed with the patient and are negative.  PHYSICAL EXAMINATION: ECOG PERFORMANCE STATUS: 0 - Asymptomatic  Vitals:   04/14/24 0837  BP: 113/78  Pulse: (!) 56  Resp: 18  Temp: 97.7 F (36.5 C)  SpO2: 100%   Filed Weights   04/14/24 0837  Weight: 210 lb 4.8 oz (95.4 kg)    GENERAL:alert, no distress and comfortable SKIN: skin color, texture, turgor are normal, no rashes or significant lesions EYES: normal, conjunctiva are pink and non-injected, sclera clear OROPHARYNX:no exudate, no erythema and lips, buccal mucosa, and tongue normal  NECK: supple, thyroid  normal size, non-tender, without nodularity LYMPH:  no palpable lymphadenopathy in the cervical, axillary or inguinal LUNGS: clear to auscultation and percussion with normal breathing effort HEART: regular rate &  rhythm and no murmurs and no lower extremity edema ABDOMEN:abdomen soft, non-tender and normal bowel sounds Musculoskeletal:no cyanosis of digits and no clubbing  PSYCH: alert & oriented x 3 with fluent speech NEURO: no focal motor/sensory deficits  Physical Exam NECK: No cervical or inguinal lymphadenopathy. ABDOMEN: Spleen not palpable. Mild tenderness on the left side of the abdomen.  LABORATORY DATA:  I have reviewed the data as listed    Latest Ref Rng & Units 04/14/2024    9:09 AM 03/29/2024    5:50 PM 08/25/2023    8:33 AM  CBC  WBC 4.0 - 10.5 K/uL 6.3  6.1  6.1   Hemoglobin 13.0 - 17.0 g/dL 86.6  87.1  85.9   Hematocrit 39.0 - 52.0 % 40.1  38.7  42.6   Platelets 150 - 400 K/uL 169  150  173     @cmpl @  RADIOGRAPHIC STUDIES: I have personally reviewed the radiological images as listed and agreed with the findings in the report. EEG adult Result Date: 04/10/2024 Georjean Darice HERO, MD     04/12/2024 12:19 PM ELECTROENCEPHALOGRAM REPORT Date of Study: 04/10/2024 Patient's Name: William Farley MRN: [id number] Date of Birth: 02-Feb-1961 Referring Provider: Dr. Darice Georjean Clinical History: This is a 64 year old man with seizure recurrence. EEG for classification. CNS Active Medications: Lamotrigine , Gabapentin , Sertraline  Technical Summary: A multichannel digital 1-hour EEG recording measured by the international 10-20 system with electrodes applied with paste and impedances below 5000 ohms performed in our laboratory with EKG monitoring in an awake and asleep patient.  Hyperventilation was not performed. Photic stimulation was performed.  The digital EEG was referentially recorded, reformatted, and digitally filtered in a variety of bipolar and referential montages for optimal display.  Description: The patient is awake and asleep during the recording.  During maximal wakefulness, there is a symmetric, medium voltage 10-11 Hz posterior dominant rhythm that attenuates with eye opening.  The  record is symmetric.  During drowsiness and sleep, there is an increase in theta slowing of the background.  Vertex waves and symmetric sleep spindles were seen. Photic stimulation did not elicit any abnormalities.  There were no epileptiform discharges or electrographic seizures seen.  EKG lead was unremarkable. Impression: This 1-hour awake and asleep EEG is normal.  Clinical Correlation: A normal EEG does not exclude a clinical diagnosis of epilepsy.  If further clinical questions remain, prolonged EEG may be helpful.  Clinical correlation is advised. Darice Shivers, M.D.   CT CERVICAL SPINE WO CONTRAST Result Date: 03/29/2024 CLINICAL DATA:  Status post multiple seizures. EXAM: CT CERVICAL SPINE WITHOUT CONTRAST TECHNIQUE: Multidetector CT imaging of the cervical spine was performed without intravenous contrast. Multiplanar CT image reconstructions were also generated. RADIATION DOSE REDUCTION: This exam was performed according to the departmental dose-optimization program which includes automated exposure control, adjustment of the mA and/or kV according to patient size and/or use of iterative reconstruction technique. COMPARISON:  None Available. FINDINGS: Alignment: There is approximately 2 mm retrolisthesis of the C4 vertebral body on C5. Skull base and vertebrae: No acute fracture. No primary bone lesion or focal pathologic process. Soft tissues and spinal canal: No prevertebral fluid or swelling. No visible canal hematoma. Disc levels: Mild endplate sclerosis is seen at the level of C4-C5 and C7-T1 with a metallic density fixation plate and screws present along the anterior aspects of the C5, C6 and C7 vertebral bodies. There is anterior cervical fusion of the levels of C5-C6 and C6-C7 with moderate to marked severity intervertebral disc space narrowing present at C4-C5. Bilateral mild-to-moderate severity multilevel facet joint hypertrophy is noted. Upper chest: Negative. Other: None. IMPRESSION: 1. No  acute fracture or traumatic subluxation of the cervical spine. 2. Anterior cervical fusion of the levels of C5-C6 and C6-C7. 3. Moderate to marked severity degenerative disc disease at C4-C5. Electronically Signed   By: Suzen Dials M.D.   On: 03/29/2024 18:40   CT HEAD WO CONTRAST Result Date: 03/29/2024 CLINICAL DATA:  Status post multiple seizures. EXAM: CT HEAD WITHOUT CONTRAST TECHNIQUE: Contiguous axial images were obtained from the base of the skull through the vertex without intravenous contrast. RADIATION DOSE REDUCTION: This exam was performed according to the departmental dose-optimization program which includes automated exposure control, adjustment of the mA and/or kV according to patient size and/or use of iterative reconstruction technique. COMPARISON:  None Available. FINDINGS: Brain: No evidence of acute infarction, hemorrhage, hydrocephalus, extra-axial collection or mass lesion/mass effect. Vascular: No hyperdense vessel or unexpected calcification. Skull: Normal. Negative for fracture or focal lesion. Sinuses/Orbits: No acute finding. Other: None. IMPRESSION: No acute intracranial pathology. Electronically Signed   By: Suzen Dials M.D.   On: 03/29/2024 18:31   DG Chest Port 1 View Result Date: 03/29/2024 EXAM: 1 VIEW(S) XRAY OF THE CHEST 03/29/2024 06:00:00 PM COMPARISON: None available. CLINICAL HISTORY: Seizure (HCC) FINDINGS: LUNGS AND PLEURA: No focal pulmonary opacity. No pleural effusion. No pneumothorax. HEART AND MEDIASTINUM: No acute abnormality of the cardiac and mediastinal silhouettes. BONES AND SOFT TISSUES: Partially visualized cervical spine surgical hardware. No acute osseous abnormality. IMPRESSION: 1. No acute findings. Electronically signed by: Elsie Gravely MD 03/29/2024 06:06 PM EST RP Workstation: [id number]   Assessment & Plan Splenomegaly He has longstanding splenomegaly, intermittently over 30 years (per pt), with recent imaging confirming mild to  moderate enlargement. The etiology is most likely benign, possibly related to prior alcohol use or other non-malignant causes, as there is no evidence of  acute or chronic liver disease, hematologic malignancy, or other significant pathology on recent imaging or laboratory studies. Lymphoma is unlikely given the absence of lymphadenopathy on abdominal imaging, abnormal blood counts, or significant imaging findings. Although he reports constitutional symptoms including night sweats and weight loss, no supporting evidence for malignancy was found on examination or review of systems. Intermittent thrombocytopenia is likely secondary to splenic sequestration. Overall, suspicion for a malignant or progressive hematologic process is low. - Performed physical examination including lymph node survey and abdominal exam, which were unremarkable for significant lymphadenopathy or palpable splenomegaly. - Ordered laboratory studies including LDH and repeat blood counts to further evaluate for hematologic malignancy or other causes of splenomegaly. - Ordered CT chest scan to complete workup and assess for occult pathology. - Planned follow-up by phone in approximately two weeks to review results and determine need for further evaluation.  Plan - Labs today - CT chest with contrast to evaluate adenopathy in the next few weeks - I will call him after the above workup   Orders Placed This Encounter  Procedures   CT Chest W Contrast    Standing Status:   Future    Expected Date:   04/28/2024    Expiration Date:   04/14/2025    If indicated for the ordered procedure, I authorize the administration of contrast media per Radiology protocol:   Yes    Does the patient have a contrast media/X-ray dye allergy?:   No    Preferred imaging location?:   [facility]   Lactate dehydrogenase (LDH)    Standing Status:   Future    Number of Occurrences:   1    Expected Date:   04/14/2024    Expiration Date:   04/14/2025    CBC with Differential/Platelet    Standing Status:   Future    Number of Occurrences:   1    Expected Date:   04/14/2024    Expiration Date:   04/14/2025    All questions were answered. The patient knows to call the clinic with any problems, questions or concerns. I spent 35 minutes counseling the patient face to face. The total time spent in the appointment was 45 minutes including review of chart and various tests results, discussions about plan of care and coordination of care plan.     Onita Mattock, MD 04/14/2024 11:45 AM    "

## 2024-04-14 ENCOUNTER — Inpatient Hospital Stay: Attending: Hematology | Admitting: Hematology

## 2024-04-14 ENCOUNTER — Encounter: Payer: Self-pay | Admitting: Hematology

## 2024-04-14 ENCOUNTER — Inpatient Hospital Stay

## 2024-04-14 VITALS — BP 113/78 | HR 56 | Temp 97.7°F | Resp 18 | Ht 71.0 in | Wt 210.3 lb

## 2024-04-14 DIAGNOSIS — Z87891 Personal history of nicotine dependence: Secondary | ICD-10-CM | POA: Insufficient documentation

## 2024-04-14 DIAGNOSIS — R161 Splenomegaly, not elsewhere classified: Secondary | ICD-10-CM | POA: Diagnosis not present

## 2024-04-14 DIAGNOSIS — D696 Thrombocytopenia, unspecified: Secondary | ICD-10-CM | POA: Diagnosis present

## 2024-04-14 LAB — CBC WITH DIFFERENTIAL/PLATELET
Abs Immature Granulocytes: 0.02 K/uL (ref 0.00–0.07)
Basophils Absolute: 0 K/uL (ref 0.0–0.1)
Basophils Relative: 1 %
Eosinophils Absolute: 0.2 K/uL (ref 0.0–0.5)
Eosinophils Relative: 4 %
HCT: 40.1 % (ref 39.0–52.0)
Hemoglobin: 13.3 g/dL (ref 13.0–17.0)
Immature Granulocytes: 0 %
Lymphocytes Relative: 11 %
Lymphs Abs: 0.7 K/uL (ref 0.7–4.0)
MCH: 29.1 pg (ref 26.0–34.0)
MCHC: 33.2 g/dL (ref 30.0–36.0)
MCV: 87.7 fL (ref 80.0–100.0)
Monocytes Absolute: 0.5 K/uL (ref 0.1–1.0)
Monocytes Relative: 8 %
Neutro Abs: 4.8 K/uL (ref 1.7–7.7)
Neutrophils Relative %: 76 %
Platelets: 169 K/uL (ref 150–400)
RBC: 4.57 MIL/uL (ref 4.22–5.81)
RDW: 14.5 % (ref 11.5–15.5)
WBC: 6.3 K/uL (ref 4.0–10.5)
nRBC: 0 % (ref 0.0–0.2)

## 2024-04-14 LAB — LACTATE DEHYDROGENASE: LDH: 179 U/L (ref 105–235)

## 2024-05-03 ENCOUNTER — Ambulatory Visit (HOSPITAL_COMMUNITY)

## 2024-05-05 ENCOUNTER — Other Ambulatory Visit

## 2024-05-10 ENCOUNTER — Ambulatory Visit (HOSPITAL_COMMUNITY): Admitting: Clinical

## 2024-05-10 ENCOUNTER — Other Ambulatory Visit

## 2024-05-11 ENCOUNTER — Inpatient Hospital Stay: Admitting: Hematology

## 2024-05-15 ENCOUNTER — Ambulatory Visit: Admitting: Nurse Practitioner

## 2024-05-16 ENCOUNTER — Ambulatory Visit: Payer: Self-pay | Admitting: Neurology

## 2024-05-17 ENCOUNTER — Ambulatory Visit: Admitting: Urology

## 2024-07-24 ENCOUNTER — Ambulatory Visit

## 2025-02-09 ENCOUNTER — Ambulatory Visit: Admitting: Orthopedic Surgery
